# Patient Record
Sex: Female | Born: 1955 | Race: White | Hispanic: No | Marital: Married | Smoking: Former smoker
Health system: Southern US, Community
[De-identification: ages and names within clinical notes are randomized; demographics above are authoritative.]

## PROBLEM LIST (undated history)

## (undated) DIAGNOSIS — C801 Malignant (primary) neoplasm, unspecified: Secondary | ICD-10-CM

## (undated) DIAGNOSIS — M1990 Unspecified osteoarthritis, unspecified site: Secondary | ICD-10-CM

## (undated) DIAGNOSIS — T7840XA Allergy, unspecified, initial encounter: Secondary | ICD-10-CM

## (undated) DIAGNOSIS — E079 Disorder of thyroid, unspecified: Secondary | ICD-10-CM

## (undated) DIAGNOSIS — F419 Anxiety disorder, unspecified: Secondary | ICD-10-CM

## (undated) DIAGNOSIS — R112 Nausea with vomiting, unspecified: Secondary | ICD-10-CM

## (undated) DIAGNOSIS — Z9889 Other specified postprocedural states: Secondary | ICD-10-CM

## (undated) DIAGNOSIS — E785 Hyperlipidemia, unspecified: Secondary | ICD-10-CM

## (undated) DIAGNOSIS — E039 Hypothyroidism, unspecified: Secondary | ICD-10-CM

## (undated) DIAGNOSIS — J439 Emphysema, unspecified: Secondary | ICD-10-CM

## (undated) DIAGNOSIS — I739 Peripheral vascular disease, unspecified: Secondary | ICD-10-CM

## (undated) DIAGNOSIS — F32A Depression, unspecified: Secondary | ICD-10-CM

## (undated) DIAGNOSIS — Z5189 Encounter for other specified aftercare: Secondary | ICD-10-CM

## (undated) DIAGNOSIS — I6529 Occlusion and stenosis of unspecified carotid artery: Secondary | ICD-10-CM

## (undated) HISTORY — DX: Peripheral vascular disease, unspecified: I73.9

## (undated) HISTORY — PX: ANGIOPLASTY ILLIAC ARTERY: SHX5720

## (undated) HISTORY — DX: Emphysema, unspecified: J43.9

## (undated) HISTORY — DX: Disorder of thyroid, unspecified: E07.9

## (undated) HISTORY — DX: Hyperlipidemia, unspecified: E78.5

## (undated) HISTORY — DX: Occlusion and stenosis of unspecified carotid artery: I65.29

## (undated) HISTORY — PX: NOSE SURGERY: SHX723

## (undated) HISTORY — DX: Encounter for other specified aftercare: Z51.89

## (undated) HISTORY — DX: Depression, unspecified: F32.A

## (undated) HISTORY — DX: Anxiety disorder, unspecified: F41.9

## (undated) HISTORY — DX: Allergy, unspecified, initial encounter: T78.40XA

## (undated) HISTORY — PX: SKIN CANCER EXCISION: SHX779

## (undated) HISTORY — DX: Malignant (primary) neoplasm, unspecified: C80.1

## (undated) HISTORY — DX: Hypocalcemia: E83.51

## (undated) HISTORY — PX: HERNIA REPAIR: SHX51

---

## 2005-02-19 ENCOUNTER — Ambulatory Visit: Payer: Self-pay | Admitting: Family Medicine

## 2005-11-13 ENCOUNTER — Ambulatory Visit: Payer: Self-pay | Admitting: "Endocrinology

## 2005-11-21 ENCOUNTER — Encounter (HOSPITAL_COMMUNITY): Admission: RE | Admit: 2005-11-21 | Discharge: 2006-02-19 | Payer: Self-pay | Admitting: "Endocrinology

## 2005-11-26 DIAGNOSIS — C801 Malignant (primary) neoplasm, unspecified: Secondary | ICD-10-CM

## 2005-11-26 DIAGNOSIS — E079 Disorder of thyroid, unspecified: Secondary | ICD-10-CM

## 2005-11-26 HISTORY — PX: THYROID SURGERY: SHX805

## 2005-11-26 HISTORY — DX: Disorder of thyroid, unspecified: E07.9

## 2005-11-26 HISTORY — DX: Malignant (primary) neoplasm, unspecified: C80.1

## 2005-12-24 ENCOUNTER — Encounter: Admission: RE | Admit: 2005-12-24 | Discharge: 2005-12-24 | Payer: Self-pay | Admitting: "Endocrinology

## 2005-12-24 ENCOUNTER — Encounter (INDEPENDENT_AMBULATORY_CARE_PROVIDER_SITE_OTHER): Payer: Self-pay | Admitting: *Deleted

## 2005-12-24 ENCOUNTER — Other Ambulatory Visit: Admission: RE | Admit: 2005-12-24 | Discharge: 2005-12-24 | Payer: Self-pay | Admitting: Interventional Radiology

## 2006-02-06 ENCOUNTER — Ambulatory Visit: Payer: Self-pay | Admitting: Otolaryngology

## 2006-03-15 ENCOUNTER — Emergency Department (HOSPITAL_COMMUNITY): Admission: EM | Admit: 2006-03-15 | Discharge: 2006-03-15 | Payer: Self-pay | Admitting: Emergency Medicine

## 2006-04-02 ENCOUNTER — Ambulatory Visit: Payer: Self-pay | Admitting: "Endocrinology

## 2006-10-21 ENCOUNTER — Ambulatory Visit: Payer: Self-pay | Admitting: "Endocrinology

## 2007-06-03 ENCOUNTER — Ambulatory Visit: Payer: Self-pay | Admitting: "Endocrinology

## 2007-11-12 ENCOUNTER — Ambulatory Visit: Payer: Self-pay | Admitting: "Endocrinology

## 2008-02-26 ENCOUNTER — Ambulatory Visit: Payer: Self-pay | Admitting: "Endocrinology

## 2011-04-06 ENCOUNTER — Other Ambulatory Visit: Payer: Self-pay | Admitting: Internal Medicine

## 2011-08-16 ENCOUNTER — Telehealth: Payer: Self-pay | Admitting: Internal Medicine

## 2011-08-16 NOTE — Telephone Encounter (Signed)
Yes, you can add her on.

## 2011-08-16 NOTE — Telephone Encounter (Signed)
Yes can add on

## 2011-08-16 NOTE — Telephone Encounter (Signed)
Patient says that she went to walk in clinic for sore throat, congestion, nasal drainage. She has been on amoxicillin since Saturday, but is still not feeling any better and has still been running low grade fever. She is asking if she could be added on tomorrow. Please advise.

## 2011-08-16 NOTE — Telephone Encounter (Signed)
Left message asking for patient to return my call.

## 2011-08-16 NOTE — Telephone Encounter (Signed)
Tried calling patient, got no answer and was unable to leave message.

## 2011-08-16 NOTE — Telephone Encounter (Signed)
Pt called she would like an appoinment asap  She has went to walk in clinic  Cold sore throat cough Saturday ran fever went to [facility] acute md was concerned that her heart was racing.  Put pt on 875 amoxcillian twice a day.  Yesterday she ran low grade fever still had cough and hard to breath

## 2011-09-17 ENCOUNTER — Other Ambulatory Visit: Payer: Self-pay | Admitting: Internal Medicine

## 2011-09-17 MED ORDER — ZOLPIDEM TARTRATE 10 MG PO TABS: 10.0000 mg | ORAL_TABLET | Freq: Every evening | ORAL | Status: DC | PRN

## 2011-10-19 ENCOUNTER — Encounter: Payer: Self-pay | Admitting: Internal Medicine

## 2011-10-19 ENCOUNTER — Ambulatory Visit (INDEPENDENT_AMBULATORY_CARE_PROVIDER_SITE_OTHER): Payer: Self-pay | Admitting: Internal Medicine

## 2011-10-19 VITALS — BP 126/64 | HR 93 | Temp 98.3°F | Resp 16 | Ht 67.0 in | Wt 135.0 lb

## 2011-10-19 DIAGNOSIS — Z23 Encounter for immunization: Secondary | ICD-10-CM

## 2011-10-19 DIAGNOSIS — Z1239 Encounter for other screening for malignant neoplasm of breast: Secondary | ICD-10-CM

## 2011-10-19 DIAGNOSIS — Z780 Asymptomatic menopausal state: Secondary | ICD-10-CM

## 2011-10-19 DIAGNOSIS — Z87891 Personal history of nicotine dependence: Secondary | ICD-10-CM

## 2011-10-19 DIAGNOSIS — F411 Generalized anxiety disorder: Secondary | ICD-10-CM

## 2011-10-19 DIAGNOSIS — E785 Hyperlipidemia, unspecified: Secondary | ICD-10-CM

## 2011-10-19 DIAGNOSIS — F419 Anxiety disorder, unspecified: Secondary | ICD-10-CM

## 2011-10-19 DIAGNOSIS — F17200 Nicotine dependence, unspecified, uncomplicated: Secondary | ICD-10-CM

## 2011-10-19 DIAGNOSIS — E039 Hypothyroidism, unspecified: Secondary | ICD-10-CM

## 2011-10-19 DIAGNOSIS — Z1211 Encounter for screening for malignant neoplasm of colon: Secondary | ICD-10-CM

## 2011-10-19 DIAGNOSIS — E079 Disorder of thyroid, unspecified: Secondary | ICD-10-CM

## 2011-10-19 DIAGNOSIS — Z124 Encounter for screening for malignant neoplasm of cervix: Secondary | ICD-10-CM

## 2011-10-19 DIAGNOSIS — Z79899 Other long term (current) drug therapy: Secondary | ICD-10-CM

## 2011-10-19 DIAGNOSIS — N951 Menopausal and female climacteric states: Secondary | ICD-10-CM

## 2011-10-19 LAB — HEPATIC FUNCTION PANEL
ALT: 16 U/L (ref 0–35)
AST: 20 U/L (ref 0–37)
Albumin: 4.1 g/dL (ref 3.5–5.2)
Alkaline Phosphatase: 97 U/L (ref 39–117)

## 2011-10-19 LAB — TSH: TSH: 4.69 u[IU]/mL (ref 0.35–5.50)

## 2011-10-19 MED ORDER — CONJ ESTROG-MEDROXYPROGEST ACE 0.625-2.5 MG PO TABS: 1.0000 | ORAL_TABLET | Freq: Every day | ORAL | Status: DC

## 2011-10-19 MED ORDER — CARVEDILOL 3.125 MG PO TABS: 3.1250 mg | ORAL_TABLET | Freq: Two times a day (BID) | ORAL | Status: DC

## 2011-10-19 MED ORDER — ALPRAZOLAM 0.25 MG PO TABS: 0.2500 mg | ORAL_TABLET | Freq: Every day | ORAL | Status: DC | PRN

## 2011-10-19 MED ORDER — ROSUVASTATIN CALCIUM 20 MG PO TABS: 20.0000 mg | ORAL_TABLET | Freq: Every day | ORAL | Status: DC

## 2011-10-19 MED ORDER — CONJ ESTROG-MEDROXYPROGEST ACE 0.625-5 MG PO TABS: 1.0000 | ORAL_TABLET | Freq: Every day | ORAL | Status: DC

## 2011-10-19 NOTE — Progress Notes (Signed)
  Subjective:    Patient ID: Tracie Morris, female    DOB: 03/16/1956, 55 y.o.   MRN: [id number]  HPI   13 you white female with history of tobacco abuse, postmenopausal syndrome, hypothyroidism treated in late Sept for bronchitis.  By Urgent Care with Augmentin followed by recurrence of fevers and cough.  Was reevaluated, chest ray was reportedly normal and her antibioitcs were changed from augmentin to clarithromycin .  Because of her portracted symptoms she missed 6 days of work    Review of Systems  Constitutional: Negative for fever, chills and unexpected weight change.  HENT: Negative for hearing loss, ear pain, nosebleeds, congestion, sore throat, facial swelling, rhinorrhea, sneezing, mouth sores, trouble swallowing, neck pain, neck stiffness, voice change, postnasal drip, sinus pressure, tinnitus and ear discharge.   Eyes: Negative for pain, discharge, redness and visual disturbance.  Respiratory: Negative for cough, chest tightness, shortness of breath, wheezing and stridor.   Cardiovascular: Negative for chest pain, palpitations and leg swelling.  Musculoskeletal: Negative for myalgias and arthralgias.  Skin: Negative for color change and rash.  Neurological: Negative for dizziness, weakness, light-headedness and headaches.  Hematological: Negative for adenopathy.       Objective:   Physical Exam  Constitutional: She is oriented to person, place, and time. She appears well-developed and well-nourished.  HENT:  Mouth/Throat: Oropharynx is clear and moist.  Eyes: EOM are normal. Pupils are equal, round, and reactive to light. No scleral icterus.  Neck: Normal range of motion. Neck supple. No JVD present. No thyromegaly present.  Cardiovascular: Normal rate, regular rhythm, normal heart sounds and intact distal pulses.   Pulmonary/Chest: Effort normal and breath sounds normal.  Abdominal: Soft. Bowel sounds are normal. She exhibits no mass. There is no tenderness.    Musculoskeletal: Normal range of motion. She exhibits no edema.  Lymphadenopathy:    She has no cervical adenopathy.  Neurological: She is alert and oriented to person, place, and time.  Skin: Skin is warm and dry.  Psychiatric: She has a normal mood and affect.          Assessment & Plan:   Subjective:     Tracie Morris is a 55 y.o. female who began using tobacco 30 years ago.  She has had a high interest in quitting for several years and has tried to quit several times,  And smokes cigarettes .  She recognizes the risks of chronic tobacco use, which include COPD, CAD, CVD, PAD and blood clots, especially with concurrent use of HRT.  She has used nicotine patches in the past and present and is considering changing to the electric cigarette.    Assessment:    Tobacco Use/Cessation. I assessed Tracie Morris to be in an action stage with respect to tobacco cessation.    Plan:

## 2011-10-19 NOTE — Patient Instructions (Addendum)
Increase aspirin to 325 mg daily .Marland Kitchen   Please quit smoking,  You may use the alprazolam once a day as needed for anxiety

## 2011-10-20 ENCOUNTER — Encounter: Payer: Self-pay | Admitting: Internal Medicine

## 2011-10-20 DIAGNOSIS — E785 Hyperlipidemia, unspecified: Secondary | ICD-10-CM | POA: Insufficient documentation

## 2011-10-20 DIAGNOSIS — Z124 Encounter for screening for malignant neoplasm of cervix: Secondary | ICD-10-CM | POA: Insufficient documentation

## 2011-10-20 DIAGNOSIS — Z1239 Encounter for other screening for malignant neoplasm of breast: Secondary | ICD-10-CM | POA: Insufficient documentation

## 2011-10-20 DIAGNOSIS — Z1211 Encounter for screening for malignant neoplasm of colon: Secondary | ICD-10-CM | POA: Insufficient documentation

## 2011-10-20 DIAGNOSIS — E032 Hypothyroidism due to medicaments and other exogenous substances: Secondary | ICD-10-CM | POA: Insufficient documentation

## 2011-10-20 DIAGNOSIS — G43909 Migraine, unspecified, not intractable, without status migrainosus: Secondary | ICD-10-CM | POA: Insufficient documentation

## 2011-10-20 NOTE — Assessment & Plan Note (Signed)
Last mammogram normal June 2012 at [facility]

## 2011-10-20 NOTE — Assessment & Plan Note (Signed)
Repeat TSH is WNL.  She has not tolerated higher doses of Synthroid for goal TSH of 1.0  Due to fatigue

## 2011-10-20 NOTE — Assessment & Plan Note (Signed)
She has historically deferred screening.  Will discuss at next PE May 2012

## 2011-10-20 NOTE — Assessment & Plan Note (Signed)
Normal PAP May 2011.  No history of abnormals. On HRT for menopause

## 2011-10-22 ENCOUNTER — Encounter: Payer: Self-pay | Admitting: Internal Medicine

## 2011-10-29 ENCOUNTER — Other Ambulatory Visit: Payer: Self-pay | Admitting: Internal Medicine

## 2011-10-29 MED ORDER — LEVOTHYROXINE SODIUM 137 MCG PO TABS: 137.0000 ug | ORAL_TABLET | Freq: Every day | ORAL | Status: DC

## 2012-01-02 ENCOUNTER — Encounter: Payer: Self-pay | Admitting: Internal Medicine

## 2012-03-31 ENCOUNTER — Other Ambulatory Visit: Payer: Self-pay | Admitting: Internal Medicine

## 2012-03-31 MED ORDER — ZOLPIDEM TARTRATE 10 MG PO TABS: 10.0000 mg | ORAL_TABLET | Freq: Every evening | ORAL | Status: DC | PRN

## 2012-05-09 ENCOUNTER — Encounter: Payer: Self-pay | Admitting: Internal Medicine

## 2012-05-16 ENCOUNTER — Encounter: Payer: Self-pay | Admitting: Internal Medicine

## 2012-05-16 ENCOUNTER — Ambulatory Visit (INDEPENDENT_AMBULATORY_CARE_PROVIDER_SITE_OTHER): Payer: Self-pay | Admitting: Internal Medicine

## 2012-05-16 VITALS — BP 128/62 | HR 73 | Temp 98.0°F | Resp 14 | Ht 67.0 in | Wt 139.5 lb

## 2012-05-16 DIAGNOSIS — Z79899 Other long term (current) drug therapy: Secondary | ICD-10-CM

## 2012-05-16 DIAGNOSIS — I798 Other disorders of arteries, arterioles and capillaries in diseases classified elsewhere: Secondary | ICD-10-CM | POA: Insufficient documentation

## 2012-05-16 DIAGNOSIS — E785 Hyperlipidemia, unspecified: Secondary | ICD-10-CM

## 2012-05-16 DIAGNOSIS — Z1239 Encounter for other screening for malignant neoplasm of breast: Secondary | ICD-10-CM

## 2012-05-16 DIAGNOSIS — E039 Hypothyroidism, unspecified: Secondary | ICD-10-CM

## 2012-05-16 DIAGNOSIS — E079 Disorder of thyroid, unspecified: Secondary | ICD-10-CM

## 2012-05-16 DIAGNOSIS — Z1211 Encounter for screening for malignant neoplasm of colon: Secondary | ICD-10-CM

## 2012-05-16 DIAGNOSIS — I739 Peripheral vascular disease, unspecified: Secondary | ICD-10-CM

## 2012-05-16 DIAGNOSIS — Z124 Encounter for screening for malignant neoplasm of cervix: Secondary | ICD-10-CM

## 2012-05-16 LAB — COMPLETE METABOLIC PANEL WITH GFR
ALT: 17 U/L (ref 0–35)
Albumin: 4.4 g/dL (ref 3.5–5.2)
Alkaline Phosphatase: 89 U/L (ref 39–117)
CO2: 27 mEq/L (ref 19–32)
Potassium: 4.3 mEq/L (ref 3.5–5.3)
Sodium: 138 mEq/L (ref 135–145)
Total Bilirubin: 0.6 mg/dL (ref 0.3–1.2)
Total Protein: 7.1 g/dL (ref 6.0–8.3)

## 2012-05-16 LAB — LIPID PANEL
HDL: 47.8 mg/dL (ref >39.00)
Total CHOL/HDL Ratio: 3
VLDL: 16.6 mg/dL (ref 0.0–40.0)

## 2012-05-16 LAB — TSH: TSH: 3.49 u[IU]/mL (ref 0.35–5.50)

## 2012-05-16 NOTE — Assessment & Plan Note (Addendum)
Left leg,  With recent vascular evaluation confirming need for intervention with ABI 0.76.  Patient is uninsured presently and the cost is $30,000.00  Discussed options of having procedure done at [facility] if more affordable vs waiting t until January when she is elegible for obama care.  She has quit smoking

## 2012-05-16 NOTE — Progress Notes (Signed)
Patient ID: Tracie Morris, female   DOB: 04/25/1956, 56 y.o.   MRN: [id number] SUBJECTIVE:  56 y.o. female for annual routine Pap and checkup. She quit smoking in December after being diagnosed with  PAD of LLE.  Last PAP was normal in 2011 and she has been using FOBTS annually for colon CA screening.  She is having constant LLE pain and now knee pain due to PAD but cannot afford the surgery bc she is currently uninsured.   She denies chest pain, shortness of breath and abdominal pain,  No LE swelling,  Wears her seat belts 100% of the time.   Current Outpatient Prescriptions  Medication Sig Dispense Refill  . calcium carbonate (OS-CAL) 600 MG TABS Take 600 mg by mouth 2 (two) times daily with a meal.        . estrogen, conjugated,-medroxyprogesterone (PREMPRO) 0.625-2.5 MG per tablet Take 1 tablet by mouth daily.  30 tablet  3  . levothyroxine (SYNTHROID, LEVOTHROID) 137 MCG tablet Take 1 tablet (137 mcg total) by mouth daily.  30 tablet  3  . Multiple Vitamin (MULTIVITAMIN) tablet Take 1 tablet by mouth daily.        . rizatriptan (MAXALT-MLT) 10 MG disintegrating tablet Take 10 mg by mouth as needed. May repeat in 2 hours if needed      . rosuvastatin (CRESTOR) 20 MG tablet Take 1 tablet (20 mg total) by mouth daily.  30 tablet  5  . zolpidem (AMBIEN) 10 MG tablet Take 1 tablet (10 mg total) by mouth at bedtime as needed for sleep.  30 tablet  3   Allergies: Darvocet and Demerol  No LMP recorded. Patient is postmenopausal.  ROS:  Feeling well. No dyspnea or chest pain on exertion.  No abdominal pain, change in bowel habits, black or bloody stools.  No urinary tract symptoms. GYN ROS: normal menses, no abnormal bleeding, pelvic pain or discharge, no breast pain or new or enlarging lumps on self exam. No neurological complaints.  OBJECTIVE:  The patient appears well, alert, oriented x 3, in no distress. BP 128/62  Pulse 73  Temp 98 F (36.7 C) (Oral)  Resp 14  Ht 5\' 7"  (1.702 m)  Wt 139 lb  8 oz (63.277 kg)  BMI 21.85 kg/m2  SpO2 97%  ENT normal.  Neck supple. No adenopathy or thyromegaly. PERLA. Lungs are clear, good air entry, no wheezes, rhonchi or rales. S1 and S2 normal, no murmurs, regular rate and rhythm. Abdomen soft without tenderness, guarding, mass or organomegaly. Extremities show no edema, decreased left lower extremity pulse. Neurological is normal, no focal findings. Neck: no adenopathy, no carotid bruit, no JVD, supple, symmetrical, trachea midline and thyroid not enlarged, symmetric, no tenderness/mass/nodules Lungs: clear to auscultation bilaterally Heart: regular rate and rhythm, S1, S2 normal, no murmur, click, rub or gallop Abdomen: soft, non-tender; bowel sounds normal; no masses,  no organomegaly Skin: Skin color, texture, turgor normal. No rashes or lesions BREAST EXAM: breasts appear normal, no suspicious masses, no skin or nipple changes or axillary nodes PELVIC EXAM: exam declined by the patient  ASSESSMENT:   Peripheral vascular disease with pain at rest Left leg,  With recent vascular evaluation confirming need for intervention with ABI 0.76.  Patient is uninsured presently and the cost is $30,000.00  Discussed options of having procedure done at [facility] if more affordable vs waiting t until January when she is elegible for obama care.  She has quit smoking  hypothyroidism  acquired, secondary to surgery for suspicious bilateral nodules.,  Repeat TSH is WNL.  No medication changes today.  Hyperlipidemia Managed with crestor, LDL is 98.      Screening for cervical cancer She has deferred annual exam until she has insurance.  Last PAP was 3 years ago.  Screening for colon cancer Home occult testing given .   Screening for breast cancer Breast exam ordered, mammogram ordered.    Updated Medication List Outpatient Encounter Prescriptions as of 05/16/2012  Medication Sig Dispense Refill  . calcium carbonate (OS-CAL) 600 MG  TABS Take 600 mg by mouth 2 (two) times daily with a meal.        . estrogen, conjugated,-medroxyprogesterone (PREMPRO) 0.625-2.5 MG per tablet Take 1 tablet by mouth daily.  30 tablet  3  . levothyroxine (SYNTHROID, LEVOTHROID) 137 MCG tablet Take 1 tablet (137 mcg total) by mouth daily.  30 tablet  3  . Multiple Vitamin (MULTIVITAMIN) tablet Take 1 tablet by mouth daily.        . rizatriptan (MAXALT-MLT) 10 MG disintegrating tablet Take 10 mg by mouth as needed. May repeat in 2 hours if needed      . rosuvastatin (CRESTOR) 20 MG tablet Take 1 tablet (20 mg total) by mouth daily.  30 tablet  5  . zolpidem (AMBIEN) 10 MG tablet Take 1 tablet (10 mg total) by mouth at bedtime as needed for sleep.  30 tablet  3  . DISCONTD: ALPRAZolam (XANAX) 0.25 MG tablet Take 1 tablet (0.25 mg total) by mouth daily as needed for anxiety.  30 tablet  3  . DISCONTD: carvedilol (COREG) 3.125 MG tablet Take 1 tablet (3.125 mg total) by mouth 2 (two) times daily with a meal.  60 tablet  3  . DISCONTD: zolpidem (AMBIEN) 10 MG tablet Take 1 tablet (10 mg total) by mouth at bedtime as needed for sleep.  30 tablet  3

## 2012-05-18 ENCOUNTER — Encounter: Payer: Self-pay | Admitting: Internal Medicine

## 2012-05-18 NOTE — Assessment & Plan Note (Signed)
She has deferred annual exam until she has insurance.  Last PAP was 3 years ago.

## 2012-05-18 NOTE — Assessment & Plan Note (Addendum)
acquired, secondary to surgery for suspicious bilateral nodules.,  Repeat TSH is WNL.  No medication changes today.

## 2012-05-18 NOTE — Assessment & Plan Note (Addendum)
Managed with crestor, LDL is 98.

## 2012-05-18 NOTE — Assessment & Plan Note (Signed)
Breast exam ordered, mammogram ordered.

## 2012-05-18 NOTE — Assessment & Plan Note (Signed)
Home occult testing given .

## 2012-05-30 ENCOUNTER — Other Ambulatory Visit: Payer: Self-pay | Admitting: Internal Medicine

## 2012-06-02 ENCOUNTER — Other Ambulatory Visit: Payer: Self-pay | Admitting: Internal Medicine

## 2012-06-02 DIAGNOSIS — Z1211 Encounter for screening for malignant neoplasm of colon: Secondary | ICD-10-CM

## 2012-06-03 ENCOUNTER — Other Ambulatory Visit: Payer: Self-pay

## 2012-06-03 DIAGNOSIS — Z1211 Encounter for screening for malignant neoplasm of colon: Secondary | ICD-10-CM

## 2012-06-04 LAB — FECAL OCCULT BLOOD, GUAIAC: Fecal Occult Blood: NEGATIVE

## 2012-06-17 ENCOUNTER — Encounter: Payer: Self-pay | Admitting: Internal Medicine

## 2012-10-02 ENCOUNTER — Other Ambulatory Visit: Payer: Self-pay

## 2012-10-02 NOTE — Telephone Encounter (Signed)
Refill request for Ambien 10 mg ok to refill?

## 2012-10-03 MED ORDER — ZOLPIDEM TARTRATE 10 MG PO TABS: 10.0000 mg | ORAL_TABLET | Freq: Every evening | ORAL | Status: DC | PRN

## 2012-10-03 NOTE — Telephone Encounter (Signed)
Called in Ambien 10 mg 3 R to [employer]

## 2012-12-02 ENCOUNTER — Other Ambulatory Visit: Payer: Self-pay | Admitting: Internal Medicine

## 2012-12-02 MED ORDER — CONJ ESTROG-MEDROXYPROGEST ACE 0.625-2.5 MG PO TABS: 1.0000 | ORAL_TABLET | Freq: Every day | ORAL | Status: DC

## 2012-12-02 NOTE — Telephone Encounter (Signed)
estrogen, conjugated,-medroxyprogesterone (PREMPRO) 0.625-2.5 MG per tablet   #30

## 2012-12-02 NOTE — Telephone Encounter (Signed)
Med filled.  

## 2012-12-22 ENCOUNTER — Ambulatory Visit: Payer: Self-pay | Admitting: Vascular Surgery

## 2012-12-22 LAB — BASIC METABOLIC PANEL
BUN: 12 mg/dL (ref 7–18)
Creatinine: 0.84 mg/dL (ref 0.60–1.30)
EGFR (African American): >60
Glucose: 88 mg/dL (ref 65–99)
Potassium: 3.5 mmol/L (ref 3.5–5.1)

## 2013-01-05 ENCOUNTER — Ambulatory Visit (INDEPENDENT_AMBULATORY_CARE_PROVIDER_SITE_OTHER): Payer: BC Managed Care – PPO | Admitting: Internal Medicine

## 2013-01-05 ENCOUNTER — Other Ambulatory Visit (HOSPITAL_COMMUNITY)
Admission: RE | Admit: 2013-01-05 | Discharge: 2013-01-05 | Disposition: A | Discharge status: Home / Self Care | Payer: BC Managed Care – PPO | Source: Ambulatory Visit | Attending: Internal Medicine | Admitting: Internal Medicine

## 2013-01-05 ENCOUNTER — Encounter: Payer: Self-pay | Admitting: Internal Medicine

## 2013-01-05 VITALS — BP 124/66 | HR 78 | Temp 98.1°F | Resp 16 | Ht 67.0 in | Wt 139.5 lb

## 2013-01-05 DIAGNOSIS — R053 Chronic cough: Secondary | ICD-10-CM

## 2013-01-05 DIAGNOSIS — I739 Peripheral vascular disease, unspecified: Secondary | ICD-10-CM

## 2013-01-05 DIAGNOSIS — E785 Hyperlipidemia, unspecified: Secondary | ICD-10-CM

## 2013-01-05 DIAGNOSIS — Z79899 Other long term (current) drug therapy: Secondary | ICD-10-CM

## 2013-01-05 DIAGNOSIS — Z13820 Encounter for screening for osteoporosis: Secondary | ICD-10-CM

## 2013-01-05 DIAGNOSIS — R0989 Other specified symptoms and signs involving the circulatory and respiratory systems: Secondary | ICD-10-CM

## 2013-01-05 DIAGNOSIS — Z0000 Encounter for general adult medical examination without abnormal findings: Secondary | ICD-10-CM

## 2013-01-05 DIAGNOSIS — R5383 Other fatigue: Secondary | ICD-10-CM

## 2013-01-05 DIAGNOSIS — Z1151 Encounter for screening for human papillomavirus (HPV): Secondary | ICD-10-CM | POA: Insufficient documentation

## 2013-01-05 DIAGNOSIS — Z124 Encounter for screening for malignant neoplasm of cervix: Secondary | ICD-10-CM

## 2013-01-05 DIAGNOSIS — R05 Cough: Secondary | ICD-10-CM

## 2013-01-05 DIAGNOSIS — E559 Vitamin D deficiency, unspecified: Secondary | ICD-10-CM

## 2013-01-05 DIAGNOSIS — E079 Disorder of thyroid, unspecified: Secondary | ICD-10-CM

## 2013-01-05 DIAGNOSIS — E039 Hypothyroidism, unspecified: Secondary | ICD-10-CM

## 2013-01-05 DIAGNOSIS — R059 Cough, unspecified: Secondary | ICD-10-CM

## 2013-01-05 DIAGNOSIS — Z1239 Encounter for other screening for malignant neoplasm of breast: Secondary | ICD-10-CM

## 2013-01-05 DIAGNOSIS — R5381 Other malaise: Secondary | ICD-10-CM

## 2013-01-05 DIAGNOSIS — Z01419 Encounter for gynecological examination (general) (routine) without abnormal findings: Secondary | ICD-10-CM | POA: Insufficient documentation

## 2013-01-05 DIAGNOSIS — Z1211 Encounter for screening for malignant neoplasm of colon: Secondary | ICD-10-CM

## 2013-01-05 DIAGNOSIS — D72829 Elevated white blood cell count, unspecified: Secondary | ICD-10-CM

## 2013-01-05 LAB — CBC WITH DIFFERENTIAL/PLATELET
Basophils Absolute: 0.1 10*3/uL (ref 0.0–0.1)
Basophils Relative: 0.4 % (ref 0.0–3.0)
Eosinophils Relative: 1.3 % (ref 0.0–5.0)
Hemoglobin: 15.1 g/dL — ABNORMAL HIGH (ref 12.0–15.0)
Lymphocytes Relative: 27.6 % (ref 12.0–46.0)
Monocytes Relative: 5.4 % (ref 3.0–12.0)
Neutro Abs: 8.6 10*3/uL — ABNORMAL HIGH (ref 1.4–7.7)
RBC: 4.6 Mil/uL (ref 3.87–5.11)
RDW: 13 % (ref 11.5–14.6)
WBC: 13.2 10*3/uL — ABNORMAL HIGH (ref 4.5–10.5)

## 2013-01-05 LAB — COMPREHENSIVE METABOLIC PANEL
ALT: 18 U/L (ref 0–35)
Albumin: 4.1 g/dL (ref 3.5–5.2)
CO2: 25 mEq/L (ref 19–32)
Calcium: 9.2 mg/dL (ref 8.4–10.5)
Chloride: 103 mEq/L (ref 96–112)
GFR: 71.2 mL/min (ref >60.00)
Glucose, Bld: 82 mg/dL (ref 70–99)
Sodium: 139 mEq/L (ref 135–145)
Total Protein: 8.1 g/dL (ref 6.0–8.3)

## 2013-01-05 LAB — LIPID PANEL: Total CHOL/HDL Ratio: 5

## 2013-01-05 LAB — POCT URINALYSIS DIPSTICK
Bilirubin, UA: NEGATIVE
Glucose, UA: NEGATIVE
Nitrite, UA: NEGATIVE

## 2013-01-05 MED ORDER — RIZATRIPTAN BENZOATE 10 MG PO TBDP: 10.0000 mg | ORAL_TABLET | ORAL | Status: DC | PRN

## 2013-01-05 MED ORDER — LEVOTHYROXINE SODIUM 150 MCG PO TABS: 150.0000 ug | ORAL_TABLET | Freq: Every day | ORAL | Status: DC

## 2013-01-05 MED ORDER — BUTORPHANOL TARTRATE 10 MG/ML NA SOLN: 1.0000 | Freq: Once | NASAL | Status: DC | PRN

## 2013-01-05 NOTE — Assessment & Plan Note (Addendum)
S/p PTCA with balloon angioplasty of left  common iliac artery  On jan 14th by Festus Barren.  She now has sympotms of carotid artery stenosis..  Carotid ultrasound ordered.

## 2013-01-05 NOTE — Assessment & Plan Note (Addendum)
Repeat TSH due is underactive. Dose will be increased

## 2013-01-05 NOTE — Assessment & Plan Note (Addendum)
Pelvic and PAP were done today,  Pelvic exam was normal

## 2013-01-05 NOTE — Assessment & Plan Note (Signed)
Continue annual FOBTS which are up to date and negative

## 2013-01-05 NOTE — Progress Notes (Signed)
Patient ID: Tracie Morris, female   DOB: 07-May-1956, 57 y.o.   MRN: [id number]  Patient Active Problem List  Diagnosis  . Thyroid cancer  . hypothyroidism  . Screening for cervical cancer  . Screening for breast cancer  . Screening for colon cancer  . Hyperlipidemia  . Migraine  . Peripheral vascular disease with pain at rest    Subjective:  CC:   Chief Complaint  Patient presents with  . Annual Exam    HPI:   Tracie Morris a 57 y.o. female who presents For her PAP smear,  Which was deferred last time bc of insurance lapse.  She has stopped the prempro due to pharmacy miscommunicaton and feels much better.  Her claudication symptoms resolved with angioplasty of left common iliac artery.    Past Medical History  Diagnosis Date  . Hypocalcemia   . Migraine     improved since thyroid surgery, 2 years  . Thyroid cancer 2007    s/p thyroidectomy  . Hyperlipidemia   . hypothyroidism 2007    acquired, secondary to surgery for thyroid Ca    Past Surgical History  Procedure Laterality Date  . Thyroid surgery  2007    secondary to thyroid cancer    The following portions of the patient's history were reviewed and updated as appropriate: Allergies, current medications, and problem list.   Review of Systems:   Patient denies headache, fevers, malaise, unintentional weight loss, skin rash, eye pain, sinus congestion and sinus pain, sore throat, dysphagia,  hemoptysis , cough, dyspnea, wheezing, chest pain, palpitations, orthopnea, edema, abdominal pain, nausea, melena, diarrhea, constipation, flank pain, dysuria, hematuria, urinary  Frequency, nocturia, numbness, tingling, seizures,  Focal weakness, Loss of consciousness,  Tremor, insomnia, depression, anxiety, and suicidal ideation.     History   Social History  . Marital Status: Married    Spouse Name: N/A    Number of Children: N/A  . Years of Education: N/A   Occupational History  . Not on file.   Social  History Main Topics  . Smoking status: Former Smoker -- 1.00 packs/day    Types: Cigarettes    Quit date: 10/28/2011  . Smokeless tobacco: Never Used  . Alcohol Use: No  . Drug Use: No  . Sexually Active: Not on file   Other Topics Concern  . Not on file   Social History Narrative  . No narrative on file    Objective:  BP 124/66  Pulse 78  Temp(Src) 98.1 F (36.7 C) (Oral)  Resp 16  Ht 5\' 7"  (1.702 m)  Wt 139 lb 8 oz (63.277 kg)  BMI 21.84 kg/m2  SpO2 98%  BP 124/66  Pulse 78  Temp(Src) 98.1 F (36.7 C) (Oral)  Resp 16  Ht 5\' 7"  (1.702 m)  Wt 139 lb 8 oz (63.277 kg)  BMI 21.84 kg/m2  SpO2 98%  General Appearance:    Alert, cooperative, no distress, appears stated age  Head:    Normocephalic, without obvious abnormality, atraumatic  Eyes:    PERRL, conjunctiva/corneas clear, EOM's intact, fundi    benign, both eyes  Ears:    Normal TM's and external ear canals, both ears  Nose:   Nares normal, septum midline, mucosa normal, no drainage    or sinus tenderness  Throat:   Lips, mucosa, and tongue normal; teeth and gums normal  Neck:   Supple, symmetrical, trachea midline, no adenopathy;    thyroid:  no enlargement/tenderness/nodules; no carotid  bruit or JVD  Back:     Symmetric, no curvature, ROM normal, no CVA tenderness  Lungs:     Clear to auscultation bilaterally, respirations unlabored  Chest Wall:    No tenderness or deformity   Heart:    Regular rate and rhythm, S1 and S2 normal, no murmur, rub   or gallop  Breast Exam:    No tenderness, masses, or nipple abnormality  Abdomen:     Soft, non-tender, bowel sounds active all four quadrants,    no masses, no organomegaly  Genitalia:    Normal female without lesion, discharge or tenderness  Rectal:    Normal tone,  no masses or tenderness;     Extremities:   Extremities normal, atraumatic, no cyanosis or edema  Pulses:   2+ and symmetric all extremities  Skin:   Skin color, texture, turgor normal, no  rashes or lesions  Lymph nodes:   Cervical, supraclavicular, and axillary nodes normal  Neurologic:   CNII-XII intact, normal strength, sensation and reflexes    throughout    Assessment and Plan:  Screening for breast cancer Done June ,  Mammogram normal July   Screening for cervical cancer Pelvic and PAP were done today,  Pelvic exam was normal   Screening for colon cancer Continue annual FOBTS which are up to date and negative   Peripheral vascular disease with pain at rest S/p PTCA with balloon angioplasty of left  common iliac artery  On jan 14th by Festus Barren.  She now has sympotms of carotid artery stenosis..  Carotid ultrasound ordered.    hypothyroidism Repeat TSH due is underactive. Dose will be increased   Updated Medication List Outpatient Encounter Prescriptions as of 01/05/2013  Medication Sig Dispense Refill  . calcium carbonate (OS-CAL) 600 MG TABS Take 600 mg by mouth 2 (two) times daily with a meal.        . Multiple Vitamin (MULTIVITAMIN) tablet Take 1 tablet by mouth daily.        . rizatriptan (MAXALT-MLT) 10 MG disintegrating tablet Take 1 tablet (10 mg total) by mouth as needed. May repeat in 2 hours if needed  10 tablet  3  . rosuvastatin (CRESTOR) 20 MG tablet Take 1 tablet (20 mg total) by mouth daily.  30 tablet  5  . zolpidem (AMBIEN) 10 MG tablet Take 1 tablet (10 mg total) by mouth at bedtime as needed for sleep.  30 tablet  3  . [DISCONTINUED] rizatriptan (MAXALT-MLT) 10 MG disintegrating tablet Take 10 mg by mouth as needed. May repeat in 2 hours if needed      . [DISCONTINUED] rizatriptan (MAXALT-MLT) 10 MG disintegrating tablet Take 1 tablet (10 mg total) by mouth as needed. May repeat in 2 hours if needed  10 tablet  3  . [DISCONTINUED] SYNTHROID 137 MCG tablet TAKE ONE (1) TABLET EACH DAY  30 tablet  5  . butorphanol (STADOL) 10 MG/ML nasal spray Place 1 spray into the nose Once PRN for pain.  2.5 mL  2  . levothyroxine (SYNTHROID, LEVOTHROID)  150 MCG tablet Take 1 tablet (150 mcg total) by mouth daily.  90 tablet  3  . [DISCONTINUED] estrogen, conjugated,-medroxyprogesterone (PREMPRO) 0.625-2.5 MG per tablet Take 1 tablet by mouth daily.  30 tablet  3   No facility-administered encounter medications on file as of 01/05/2013.

## 2013-01-05 NOTE — Patient Instructions (Addendum)
Fasting labs today  Chest x ray at your leisure at [facility] (no appt needed,  You have the rx  Amber will call you with the DEXA and carotid ultrasound appts

## 2013-01-05 NOTE — Assessment & Plan Note (Addendum)
Done June ,  Mammogram normal July

## 2013-01-06 LAB — VITAMIN D 25 HYDROXY (VIT D DEFICIENCY, FRACTURES): Vit D, 25-Hydroxy: 36 ng/mL (ref 30–89)

## 2013-01-06 NOTE — Addendum Note (Signed)
Addended by: Sherlene Shams on: 01/06/2013 02:00 PM   Modules accepted: Orders

## 2013-01-11 LAB — HM PAP SMEAR: HM PAP: NORMAL

## 2013-01-11 LAB — HM DEXA SCAN

## 2013-01-12 ENCOUNTER — Other Ambulatory Visit: Payer: Self-pay | Admitting: Internal Medicine

## 2013-01-12 DIAGNOSIS — M810 Age-related osteoporosis without current pathological fracture: Secondary | ICD-10-CM

## 2013-01-13 ENCOUNTER — Telehealth: Payer: Self-pay | Admitting: Internal Medicine

## 2013-01-13 NOTE — Telephone Encounter (Signed)
DEDXA scan indicates moderate bone loss, in the osteopenia range,  Needs to continue calcium/vit d and wt bearing exercise.  Repeat in 2 yrs.  If there is continued loss will discuss medications

## 2013-01-13 NOTE — Telephone Encounter (Signed)
Pt.notified

## 2013-01-28 ENCOUNTER — Encounter: Payer: Self-pay | Admitting: Internal Medicine

## 2013-03-09 ENCOUNTER — Other Ambulatory Visit (INDEPENDENT_AMBULATORY_CARE_PROVIDER_SITE_OTHER): Payer: BC Managed Care – PPO

## 2013-03-09 DIAGNOSIS — D72829 Elevated white blood cell count, unspecified: Secondary | ICD-10-CM

## 2013-03-09 DIAGNOSIS — E785 Hyperlipidemia, unspecified: Secondary | ICD-10-CM

## 2013-03-09 LAB — CBC WITH DIFFERENTIAL/PLATELET
Basophils Absolute: 0 10*3/uL (ref 0.0–0.1)
Eosinophils Absolute: 0.2 10*3/uL (ref 0.0–0.7)
Lymphocytes Relative: 30.1 % (ref 12.0–46.0)
MCHC: 34.2 g/dL (ref 30.0–36.0)
Neutrophils Relative %: 61.5 % (ref 43.0–77.0)
RBC: 4.12 Mil/uL (ref 3.87–5.11)
RDW: 13.1 % (ref 11.5–14.6)

## 2013-03-09 LAB — TSH: TSH: 0.39 u[IU]/mL (ref 0.35–5.50)

## 2013-03-10 ENCOUNTER — Encounter: Payer: Self-pay | Admitting: General Practice

## 2013-03-24 ENCOUNTER — Other Ambulatory Visit: Payer: Self-pay | Admitting: *Deleted

## 2013-03-24 MED ORDER — ZOLPIDEM TARTRATE 10 MG PO TABS: 10.0000 mg | ORAL_TABLET | Freq: Every evening | ORAL | Status: DC | PRN

## 2013-03-27 ENCOUNTER — Telehealth: Payer: Self-pay

## 2013-03-27 ENCOUNTER — Other Ambulatory Visit: Payer: Self-pay | Admitting: *Deleted

## 2013-03-27 MED ORDER — ZOLPIDEM TARTRATE 10 MG PO TABS: 10.0000 mg | ORAL_TABLET | Freq: Every evening | ORAL | Status: DC | PRN

## 2013-03-27 NOTE — Telephone Encounter (Signed)
Ok to refill Remus Loffler,  Authorized in epic

## 2013-03-27 NOTE — Telephone Encounter (Signed)
Phoned in Ambien refill

## 2013-03-27 NOTE — Telephone Encounter (Signed)
Needs Ambien refill. Please advise .

## 2013-03-27 NOTE — Addendum Note (Signed)
Addended by: Sherlene Shams on: 03/27/2013 05:15 PM   Modules accepted: Orders

## 2013-03-27 NOTE — Telephone Encounter (Signed)
Patient left message on voicemail about needing a rx refill. Called back to clarify which medication she needs refilled left message for patient to return call to office. I did see that her Ambien was sent on 03/24/13 not sure if that was the rx she needed called in.

## 2013-04-10 LAB — HM PAP SMEAR: HM Pap smear: NORMAL

## 2013-06-22 ENCOUNTER — Other Ambulatory Visit: Payer: Self-pay | Admitting: *Deleted

## 2013-06-22 DIAGNOSIS — E785 Hyperlipidemia, unspecified: Secondary | ICD-10-CM

## 2013-06-23 ENCOUNTER — Telehealth: Payer: Self-pay | Admitting: *Deleted

## 2013-06-23 MED ORDER — ROSUVASTATIN CALCIUM 20 MG PO TABS: 20.0000 mg | ORAL_TABLET | Freq: Every day | ORAL | Status: DC

## 2013-06-23 NOTE — Telephone Encounter (Signed)
Called [employer] [place] for prior authorization of Crestor 20 mg tab, received form gave to [employer]

## 2013-06-24 ENCOUNTER — Telehealth: Payer: Self-pay | Admitting: Internal Medicine

## 2013-06-24 NOTE — Telephone Encounter (Signed)
Script called in as requested.

## 2013-06-24 NOTE — Telephone Encounter (Signed)
Her insurance will no longer pay for her crestor.  They are requiring a switch to a covered alternative like atorvastatin   Due to the volume of these mandated changes we have been inundated with for every patient , we simply cannot devote the time (20 minutes per request per patient) to do prior authorizations unless it is absolutely necessary.  Therefore I would like her to try the new medication before making any requests for prior authorizations.    Atorvastatin 40 mg one tablet dialy  #90 please call in

## 2013-06-30 ENCOUNTER — Ambulatory Visit: Payer: BC Managed Care – PPO | Admitting: Internal Medicine

## 2013-07-07 ENCOUNTER — Ambulatory Visit (INDEPENDENT_AMBULATORY_CARE_PROVIDER_SITE_OTHER): Payer: BC Managed Care – PPO | Admitting: Internal Medicine

## 2013-07-07 ENCOUNTER — Encounter: Payer: Self-pay | Admitting: Internal Medicine

## 2013-07-07 VITALS — BP 126/68 | HR 85 | Temp 98.7°F | Resp 14 | Wt 140.0 lb

## 2013-07-07 DIAGNOSIS — N951 Menopausal and female climacteric states: Secondary | ICD-10-CM

## 2013-07-07 DIAGNOSIS — E079 Disorder of thyroid, unspecified: Secondary | ICD-10-CM

## 2013-07-07 DIAGNOSIS — E785 Hyperlipidemia, unspecified: Secondary | ICD-10-CM

## 2013-07-07 DIAGNOSIS — R5383 Other fatigue: Secondary | ICD-10-CM

## 2013-07-07 DIAGNOSIS — E039 Hypothyroidism, unspecified: Secondary | ICD-10-CM

## 2013-07-07 DIAGNOSIS — I739 Peripheral vascular disease, unspecified: Secondary | ICD-10-CM

## 2013-07-07 DIAGNOSIS — R5381 Other malaise: Secondary | ICD-10-CM

## 2013-07-07 DIAGNOSIS — Z1239 Encounter for other screening for malignant neoplasm of breast: Secondary | ICD-10-CM

## 2013-07-07 DIAGNOSIS — Z780 Asymptomatic menopausal state: Secondary | ICD-10-CM

## 2013-07-07 MED ORDER — CONJ ESTROG-MEDROXYPROGEST ACE 0.3-1.5 MG PO TABS: 1.0000 | ORAL_TABLET | Freq: Every day | ORAL | Status: DC

## 2013-07-07 NOTE — Assessment & Plan Note (Addendum)
Her pulse is difficult to palpate in the left foot. I am referring her to [facility] for evaluation and [place] [facility] for risk stratification

## 2013-07-07 NOTE — Assessment & Plan Note (Signed)
Repeating thyroid levels given her excessive fatigue.

## 2013-07-07 NOTE — Progress Notes (Signed)
Patient ID: Tracie Morris, female   DOB: [birth year], 57 y.o.   MRN: [id number]   Patient Active Problem List   Diagnosis Date Noted  . Post menopausal syndrome 07/07/2013  . Peripheral vascular disease with pain at rest 05/16/2012  . Screening for cervical cancer 10/20/2011  . Screening for breast cancer 10/20/2011  . Screening for colon cancer 10/20/2011  . Thyroid cancer   . hypothyroidism   . Hyperlipidemia   . Migraine     Subjective:  CC:   Chief Complaint  Patient presents with  . Follow-up    medication refill    HPI:   Tracie Morris a 57 y.o. female who presents 6 month follow up on chronic conditions including PAD, hypothyroidism, and post menopausal syndrome.     She feels miserable.  She reports constant fatigue poor sleep, decreased concentration is off, left leg aching.  Her prior angioplasty by Dr Wyn Quaker was done but was not covered by [employer] so she has not been able to follow up with him .  She has  Developed pain with prlonged standing and walking,  Hip joint has already been evaluated. She will need to see another vascular surgeon unlress she wants to pay out of pocket. She is working 3 days per week 12 hours  X 2 6 hours x 1,  But usually iti is 3 x 12 hour days  Not eating well .   She stopped her HRT last December and has not been sleeping well since then, and her concentration has been off.  .    Past Medical History  Diagnosis Date  . Hypocalcemia   . Migraine     improved since thyroid surgery, 2 years  . Thyroid cancer 2007    s/p thyroidectomy  . Hyperlipidemia   . hypothyroidism 2007    acquired, secondary to surgery for thyroid Ca    Past Surgical History  Procedure Laterality Date  . Thyroid surgery  2007    secondary to thyroid cancer       The following portions of the patient's history were reviewed and updated as appropriate: Allergies, current medications, and problem list.    Review of Systems:   Patient denies  headache, fevers,  unintentional weight loss, skin rash, eye pain, sinus congestion and sinus pain, sore throat, dysphagia,  hemoptysis , cough, dyspnea, wheezing, chest pain, palpitations, orthopnea, edema, abdominal pain, nausea, melena, diarrhea, constipation, flank pain, dysuria, hematuria, urinary  Frequency, nocturia, numbness, tingling, seizures,  Focal weakness, Loss of consciousness,  Tremor, insomnia, depression, anxiety, and suicidal ideation.     History   Social History  . Marital Status: Married    Spouse Name: N/A    Number of Children: N/A  . Years of Education: N/A   Occupational History  . Not on file.   Social History Main Topics  . Smoking status: Former Smoker -- 1.00 packs/day    Types: Cigarettes    Quit date: 10/28/2011  . Smokeless tobacco: Never Used  . Alcohol Use: No  . Drug Use: No  . Sexually Active: Not on file   Other Topics Concern  . Not on file   Social History Narrative  . No narrative on file    Objective:  Filed Vitals:   07/07/13 1520  BP: 126/68  Pulse: 85  Temp: 98.7 F (37.1 C)  Resp: 14     General appearance: alert, cooperative and appears stated age Ears: normal TM's and external ear canals  both ears Throat: lips, mucosa, and tongue normal; teeth and gums normal Neck: no adenopathy, no carotid bruit, supple, symmetrical, trachea midline and thyroid not enlarged, symmetric, no tenderness/mass/nodules Back: symmetric, no curvature. ROM normal. No CVA tenderness. Lungs: clear to auscultation bilaterally Heart: regular rate and rhythm, S1, S2 normal, no murmur, click, rub or gallop Abdomen: soft, non-tender; bowel sounds normal; no masses,  no organomegaly Pulses: 2+ and symmetric Skin: Skin color, texture, turgor normal. No rashes or lesions Lymph nodes: Cervical, supraclavicular, and axillary nodes normal.  Assessment and Plan:  Peripheral vascular disease with pain at rest Her pulse is difficult to palpate in the  left foot. I am referring her to [facility] for evaluation and [place] cardiology for risk stratification   hypothyroidism Repeating thyroid levels given her excessive fatigue.   Post menopausal syndrome Resuming low dose prempro for a month to see if symptoms improve.   Hyperlipidemia Tolerating crestor.     Updated Medication List Outpatient Encounter Prescriptions as of 07/07/2013  Medication Sig Dispense Refill  . butorphanol (STADOL) 10 MG/ML nasal spray Place 1 spray into the nose Once PRN for pain.  2.5 mL  2  . calcium carbonate (OS-CAL) 600 MG TABS Take 600 mg by mouth 2 (two) times daily with a meal.        . cholecalciferol (VITAMIN D) 1000 UNITS tablet Take 1,000 Units by mouth daily.      Marland Kitchen levothyroxine (SYNTHROID, LEVOTHROID) 150 MCG tablet Take 1 tablet (150 mcg total) by mouth daily.  90 tablet  3  . Multiple Vitamin (MULTIVITAMIN) tablet Take 1 tablet by mouth daily.        . rizatriptan (MAXALT-MLT) 10 MG disintegrating tablet Take 1 tablet (10 mg total) by mouth as needed. May repeat in 2 hours if needed  10 tablet  3  . rosuvastatin (CRESTOR) 20 MG tablet Take 1 tablet (20 mg total) by mouth daily.  30 tablet  5  . zolpidem (AMBIEN) 10 MG tablet Take 1 tablet (10 mg total) by mouth at bedtime as needed for sleep.  30 tablet  3  . estrogen, conjugated,-medroxyprogesterone (PREMPRO) 0.3-1.5 MG per tablet Take 1 tablet by mouth daily.  30 tablet  3   No facility-administered encounter medications on file as of 07/07/2013.     Orders Placed This Encounter  Procedures  . MM Digital Screening  . TSH + free T4  . Comprehensive metabolic panel  . Ambulatory referral to Vascular Surgery  . Ambulatory referral to Cardiology    No Follow-up on file.

## 2013-07-07 NOTE — Assessment & Plan Note (Signed)
Tolerating crestor.  ?

## 2013-07-07 NOTE — Assessment & Plan Note (Signed)
Resuming low dose prempro for a month to see if symptoms improve.

## 2013-07-07 NOTE — Patient Instructions (Addendum)
Referral to [facility]  For  follow up on your left leg  Referral to [facility] cardiology for a cardiac evaluation  Trial of low dose prempro to see if it helps your sleep  Will call you with the labs  Mammogram to be scheduled at [facility]

## 2013-07-08 LAB — COMPREHENSIVE METABOLIC PANEL
AST: 24 U/L (ref 0–37)
Albumin: 4 g/dL (ref 3.5–5.2)
Alkaline Phosphatase: 100 U/L (ref 39–117)
Glucose, Bld: 71 mg/dL (ref 70–99)
Potassium: 4.4 mEq/L (ref 3.5–5.1)
Sodium: 140 mEq/L (ref 135–145)
Total Protein: 7.5 g/dL (ref 6.0–8.3)

## 2013-07-09 ENCOUNTER — Encounter: Payer: Self-pay | Admitting: Internal Medicine

## 2013-07-09 LAB — TSH+FREE T4: TSH: 1.23 u[IU]/mL (ref 0.450–4.500)

## 2013-07-28 ENCOUNTER — Encounter: Payer: Self-pay | Admitting: Cardiovascular Disease

## 2013-07-28 ENCOUNTER — Other Ambulatory Visit: Payer: Self-pay | Admitting: *Deleted

## 2013-07-28 ENCOUNTER — Ambulatory Visit (INDEPENDENT_AMBULATORY_CARE_PROVIDER_SITE_OTHER): Payer: BC Managed Care – PPO | Admitting: Cardiovascular Disease

## 2013-07-28 VITALS — BP 108/72 | HR 91 | Ht 67.0 in | Wt 138.2 lb

## 2013-07-28 DIAGNOSIS — R5383 Other fatigue: Secondary | ICD-10-CM

## 2013-07-28 DIAGNOSIS — I739 Peripheral vascular disease, unspecified: Secondary | ICD-10-CM

## 2013-07-28 DIAGNOSIS — R0602 Shortness of breath: Secondary | ICD-10-CM

## 2013-07-28 DIAGNOSIS — R5381 Other malaise: Secondary | ICD-10-CM

## 2013-07-28 DIAGNOSIS — R0789 Other chest pain: Secondary | ICD-10-CM

## 2013-07-28 MED ORDER — ASPIRIN 81 MG PO TABS: 81.0000 mg | ORAL_TABLET | Freq: Every day | ORAL | Status: DC

## 2013-07-28 NOTE — Patient Instructions (Addendum)
Your physician has requested that you have an exercise tolerance test. For further information please visit [email or web address] Please also follow instruction sheet, as given.  Your physician has requested that you have an ankle brachial index (ABI). During this test an ultrasound and blood pressure cuff are used to evaluate the arteries that supply the arms and legs with blood. Allow thirty minutes for this exam. There are no restrictions or special instructions.  Your physician has requested that you have an abdominal aorta duplex. During this test, an ultrasound is used to evaluate the aorta. Allow 30 minutes for this exam. Do not eat after midnight the day before and avoid carbonated beverages  Start Aspirin 81 mg once daily  Follow up after tests

## 2013-07-28 NOTE — Progress Notes (Signed)
HPI  This is a pleasant 57 year old Caucasian female who was referred by Dr. Darrick Huntsman for cardiac evaluation. She has no previous cardiac history but does have risk factors that include previous tobacco use, hyperlipidemia and established history of peripheral arterial disease. She started having left hip and back discomfort with walking in 2008. She was evaluated by Dr. Wyn Quaker early this year. She underwent angiography which showed an occluded left common iliac artery. An angioplasty (7 mm balloon) without stent placement was performed via the right common femoral artery. There was residual 40-50% stenosis at the ostium of the left common iliac artery which could not be treated with a stent from the right femoral artery approach. The patient was already heparinized and thus access was not obtained via the left femoral artery. After the procedure, she reports partial improvement in her symptoms but not complete resolution. She continues to have lower back, left buttock and thigh discomfort with walking. It turned out that Dr. Wyn Quaker was not in insurance network and thus followup visits were not covered. She has not had surveillance after the procedure. She reports he cannot episodes of "twinges"in her chest with exertional dyspnea. No previous cardiac evaluation.  Allergies  Allergen Reactions  . Darvocet [Propoxyphene-Acetaminophen]   . Demerol      Current Outpatient Prescriptions on File Prior to Visit  Medication Sig Dispense Refill  . butorphanol (STADOL) 10 MG/ML nasal spray Place 1 spray into the nose Once PRN for pain.  2.5 mL  2  . calcium carbonate (OS-CAL) 600 MG TABS Take 600 mg by mouth 2 (two) times daily with a meal.        . estrogen, conjugated,-medroxyprogesterone (PREMPRO) 0.3-1.5 MG per tablet Take 1 tablet by mouth daily.  30 tablet  3  . levothyroxine (SYNTHROID, LEVOTHROID) 150 MCG tablet Take 1 tablet (150 mcg total) by mouth daily.  90 tablet  3  . Multiple Vitamin  (MULTIVITAMIN) tablet Take 1 tablet by mouth daily.        . rizatriptan (MAXALT-MLT) 10 MG disintegrating tablet Take 1 tablet (10 mg total) by mouth as needed. May repeat in 2 hours if needed  10 tablet  3  . rosuvastatin (CRESTOR) 20 MG tablet Take 1 tablet (20 mg total) by mouth daily.  30 tablet  5  . zolpidem (AMBIEN) 10 MG tablet Take 1 tablet (10 mg total) by mouth at bedtime as needed for sleep.  30 tablet  3   No current facility-administered medications on file prior to visit.     Past Medical History  Diagnosis Date  . Hypocalcemia   . Migraine     improved since thyroid surgery, 2 years  . Thyroid cancer 2007    s/p thyroidectomy  . Hyperlipidemia   . hypothyroidism 2007    acquired, secondary to surgery for thyroid Ca  . Carotid artery occlusion   . 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 Thyroid surgery  2007    secondary to thyroid cancer  . Angioplasty illiac artery    . Cesarean section    . Nose surgery       Family History  Problem Relation Age of Onset  . Coronary artery disease Maternal Grandmother   . Heart disease Maternal Grandmother 70  . Coronary artery disease Paternal Grandmother 14  .  Heart disease Paternal Grandmother 25  . Alzheimer's disease Father   . Stroke Mother      History   Social History  . Marital Status: Married    Spouse Name: N/A    Number of Children: N/A  . Years of Education: N/A   Occupational History  . Not on file.   Social History Main Topics  . Smoking status: Former Smoker -- 1.00 packs/day for 20 years    Types: Cigarettes    Quit date: 10/28/2011  . Smokeless tobacco: Never Used  . Alcohol Use: No  . Drug Use: No  . Sexual Activity: Not on file   Other Topics Concern  . Not on file   Social History Narrative  . No narrative on file      ROS Constitutional: Negative for fever, chills, diaphoresis, activity change, appetite change and fatigue.  HENT: Negative for hearing loss, nosebleeds, congestion, sore throat, facial swelling, drooling, trouble swallowing, neck pain, voice change, sinus pressure and tinnitus.  Eyes: Negative for photophobia, pain, discharge and visual disturbance.  Respiratory: Negative for apnea, cough and wheezing.  Cardiovascular: Negative for  leg swelling.  Gastrointestinal: Negative for nausea, vomiting, abdominal pain, diarrhea, constipation, blood in stool and abdominal distention.  Genitourinary: Negative for dysuria, urgency, frequency, hematuria and decreased urine volume.  Skin: Negative for color change, pallor, rash and wound.  Neurological: Negative for dizziness, tremors, seizures, syncope, speech difficulty, weakness, light-headedness, numbness and headaches.  Psychiatric/Behavioral: Negative for suicidal ideas, hallucinations, behavioral problems and agitation. The patient is not nervous/anxious.     PHYSICAL EXAM   BP 108/72  Pulse 91  Ht 5\' 7"  (1.702 m)  Wt 138 lb 4 oz (62.71 kg)  BMI 21.65 kg/m2 Constitutional: She is oriented to person, place, and time. She appears well-developed and well-nourished. No distress.  HENT: No nasal discharge.  Head: Normocephalic and atraumatic.  Eyes: Pupils are equal and round. Right eye exhibits no discharge. Left eye exhibits no discharge.  Neck: Normal range of motion. Neck supple. No JVD present. No thyromegaly present. No carotid bruits. Cardiovascular: Normal rate, regular rhythm, normal heart sounds. Exam reveals no gallop and no friction rub. No murmur heard.  Pulmonary/Chest: Effort normal and breath sounds normal. No stridor. No respiratory distress. She has no wheezes. She has no rales. She exhibits no tenderness.  Abdominal: Soft. Bowel sounds are normal. She exhibits no distension. There is no tenderness. There is no rebound and  no guarding.  Musculoskeletal: Normal range of motion. She exhibits no edema and no tenderness.  Neurological: She is alert and oriented to person, place, and time. Coordination normal.  Skin: Skin is warm and dry. No rash noted. She is not diaphoretic. No erythema. No pallor.  Psychiatric: She has a normal mood and affect. Her behavior is normal. Judgment and thought content normal.  Vascular: Radial pulses are normal bilaterally. Femoral pulse: Normal on the right side and slightly diminished on the left side. Posterior tibial: Normal bilaterally. Dorsalis pedis: Normal on the right side and absent on the left side.   ZOX:WRUEA  Rhythm  Rightward P/QRS axis and rotation -possible pulmonary disease.   ABNORMAL     ASSESSMENT AND PLAN

## 2013-07-28 NOTE — Assessment & Plan Note (Signed)
The patient had previous balloon angioplasty of the left common iliac artery without stent placement. High risk for restenosis given that the artery was occluded. She continued to have residual claudication after the procedure with 40 to 50% residual stenosis after balloon angioplasty.  I recommend evaluation with an ABI as well as aortoiliac duplex. Resume aspirin 81 mg once daily.  Followup after testing.

## 2013-07-28 NOTE — Assessment & Plan Note (Signed)
The patient reports atypical chest pain with exertional dyspnea. She has multiple risk factors for coronary artery disease as well as an established history of atherosclerosis with peripheral arterial disease. Baseline EKG does not show significant ischemic changes. I recommend evaluation with a treadmill stress test.  Continue aggressive treatment of risk factors. I advised her to start taking aspirin 81 mg once daily.

## 2013-07-29 ENCOUNTER — Other Ambulatory Visit: Payer: Self-pay | Admitting: *Deleted

## 2013-07-30 MED ORDER — BUTORPHANOL TARTRATE 10 MG/ML NA SOLN: 1.0000 | Freq: Once | NASAL | Status: DC | PRN

## 2013-07-30 NOTE — Telephone Encounter (Signed)
Rx faxed to pharmacy  

## 2013-08-11 ENCOUNTER — Ambulatory Visit (INDEPENDENT_AMBULATORY_CARE_PROVIDER_SITE_OTHER): Payer: BC Managed Care – PPO | Admitting: Cardiovascular Disease

## 2013-08-11 ENCOUNTER — Encounter: Payer: Self-pay | Admitting: Cardiovascular Disease

## 2013-08-11 DIAGNOSIS — R0789 Other chest pain: Secondary | ICD-10-CM

## 2013-08-11 NOTE — Procedures (Signed)
    Treadmill Stress test  Indication: Atypical chest pain  Baseline Data:  Resting EKG shows NSR with rate of 105 bpm, no significant ST or T wave changes Resting blood pressure of 132/80 mm Hg Stand bruce protocal was used.  Exercise Data:  Patient exercised for 6 min 21 sec,  Peak heart rate of 154 bpm.  This was 95 % of the maximum predicted heart rate. No symptoms of chest pain or lightheadedness were reported at peak stress or in recovery. She did have significant left leg claudication. Peak Blood pressure recorded was 200/88 Maximal work level: 7 METs.  Heart rate at 3 minutes in recovery was 114 bpm. BP response: Hypertensive HR response: Normal  EKG with Exercise: Sinus tachycardia with 0.5 mm of upsloping ST depression in the inferior leads which is nondiagnostic for ischemia.  FINAL IMPRESSION: Normal exercise stress test. No significant EKG changes concerning for ischemia. Below average exercise tolerance with a hypertensive response to exercise and poor heart rate recovery suggestive of deconditioning. Left leg claudication.  Recommendation: She is already scheduled for vascular ultrasound. Regular exercise is recommended to improve physical conditioning.

## 2013-08-11 NOTE — Patient Instructions (Addendum)
Your stress test is normal.  Follow up as planned.

## 2013-09-08 ENCOUNTER — Ambulatory Visit: Payer: BC Managed Care – PPO | Admitting: Cardiovascular Disease

## 2013-09-10 LAB — HM MAMMOGRAPHY: HM Mammogram: NORMAL

## 2013-09-14 ENCOUNTER — Ambulatory Visit: Payer: Self-pay | Admitting: Internal Medicine

## 2013-09-14 ENCOUNTER — Other Ambulatory Visit: Payer: Self-pay | Admitting: *Deleted

## 2013-09-15 MED ORDER — ZOLPIDEM TARTRATE 10 MG PO TABS: 10.0000 mg | ORAL_TABLET | Freq: Every evening | ORAL | Status: DC | PRN

## 2013-09-21 ENCOUNTER — Telehealth: Payer: Self-pay | Admitting: Internal Medicine

## 2013-09-21 NOTE — Telephone Encounter (Signed)
Script called to pharmacy and verified. Script marked as phone- in in [place].

## 2013-09-21 NOTE — Telephone Encounter (Signed)
The patient is off today and she would like this medication (zolpidem (AMBIEN) 10 MG tablet )  phoned into the pharmacy. She has been waiting since last Monday.

## 2013-09-23 ENCOUNTER — Encounter (INDEPENDENT_AMBULATORY_CARE_PROVIDER_SITE_OTHER): Payer: BC Managed Care – PPO

## 2013-09-23 DIAGNOSIS — I739 Peripheral vascular disease, unspecified: Secondary | ICD-10-CM

## 2013-09-29 ENCOUNTER — Encounter (INDEPENDENT_AMBULATORY_CARE_PROVIDER_SITE_OTHER): Payer: BC Managed Care – PPO

## 2013-09-29 DIAGNOSIS — I739 Peripheral vascular disease, unspecified: Secondary | ICD-10-CM

## 2013-10-01 ENCOUNTER — Other Ambulatory Visit: Payer: Self-pay

## 2013-10-05 ENCOUNTER — Encounter: Payer: Self-pay | Admitting: Internal Medicine

## 2013-10-05 ENCOUNTER — Ambulatory Visit (INDEPENDENT_AMBULATORY_CARE_PROVIDER_SITE_OTHER): Payer: BC Managed Care – PPO | Admitting: Cardiovascular Disease

## 2013-10-05 ENCOUNTER — Encounter: Payer: Self-pay | Admitting: Cardiovascular Disease

## 2013-10-05 ENCOUNTER — Encounter: Payer: Self-pay | Admitting: Cardiology

## 2013-10-05 ENCOUNTER — Encounter (HOSPITAL_COMMUNITY): Payer: Self-pay

## 2013-10-05 VITALS — BP 126/72 | HR 80 | Ht 67.0 in | Wt 137.2 lb

## 2013-10-05 DIAGNOSIS — Z01810 Encounter for preprocedural cardiovascular examination: Secondary | ICD-10-CM

## 2013-10-05 DIAGNOSIS — E785 Hyperlipidemia, unspecified: Secondary | ICD-10-CM

## 2013-10-05 DIAGNOSIS — I739 Peripheral vascular disease, unspecified: Secondary | ICD-10-CM

## 2013-10-05 DIAGNOSIS — R0789 Other chest pain: Secondary | ICD-10-CM

## 2013-10-05 MED ORDER — CLOPIDOGREL BISULFATE 75 MG PO TABS: 75.0000 mg | ORAL_TABLET | Freq: Every day | ORAL | Status: DC

## 2013-10-05 NOTE — Patient Instructions (Addendum)
  Start Plavix 75 mg once daily.   Your physician has requested that you have a peripheral vascular angiogram. This exam is performed at the hospital. During this exam IV contrast is used to look at arterial blood flow. Please review the information sheet given for details. Scheduled 10/14/2013.  Your physician recommends that you have labs today: BMET, CBC, PTINR

## 2013-10-05 NOTE — Progress Notes (Signed)
Primary care physician: Dr. Darrick Huntsman.   HPI  This is a pleasant 58 year old Caucasian female who is here today for a followup visit.  She has no previous cardiac history but does have risk factors that include previous tobacco use, hyperlipidemia and established history of peripheral arterial disease. She started having left hip and back discomfort with walking in 2008. She was evaluated by Dr. Wyn Quaker early this year. She underwent angiography which showed an occluded left common iliac artery. An angioplasty (7 mm balloon) without stent placement was performed via the right common femoral artery. There was residual 40-50% stenosis at the ostium of the left common iliac artery which could not be treated with a stent from the right femoral artery approach. The patient was already heparinized and thus access was not obtained via the left femoral artery. After the procedure, she reports partial improvement in her symptoms but not complete resolution. She continues to have lower back, left buttock and thigh discomfort with walking. Although this is not severe, it significantly affects her lifestyle given that she is very active.  Allergies  Allergen Reactions  . Darvocet [Propoxyphene-Acetaminophen]   . Demerol      Current Outpatient Prescriptions on File Prior to Visit  Medication Sig Dispense Refill  . aspirin 81 MG tablet Take 1 tablet (81 mg total) by mouth daily.  30 tablet    . butorphanol (STADOL) 10 MG/ML nasal spray Place 1 spray into the nose Once PRN for pain.  2.5 mL  2  . calcium carbonate (OS-CAL) 600 MG TABS Take 600 mg by mouth 2 (two) times daily with a meal.        . Cholecalciferol (VITAMIN D) 2000 UNITS tablet Take 2,000 Units by mouth daily.      Marland Kitchen levothyroxine (SYNTHROID, LEVOTHROID) 150 MCG tablet Take 1 tablet (150 mcg total) by mouth daily.  90 tablet  3  . Multiple Vitamin (MULTIVITAMIN) tablet Take 1 tablet by mouth daily.        . rizatriptan (MAXALT-MLT) 10 MG  disintegrating tablet Take 1 tablet (10 mg total) by mouth as needed. May repeat in 2 hours if needed  10 tablet  3  . rosuvastatin (CRESTOR) 20 MG tablet Take 1 tablet (20 mg total) by mouth daily.  30 tablet  5  . zolpidem (AMBIEN) 10 MG tablet Take 1 tablet (10 mg total) by mouth at bedtime as needed for sleep.  30 tablet  3   No current facility-administered medications on file prior to visit.     Past Medical History  Diagnosis Date  . Hypocalcemia   . Migraine     improved since thyroid surgery, 2 years  . Thyroid cancer 2007    s/p thyroidectomy  . Hyperlipidemia   . hypothyroidism 2007    acquired, secondary to surgery for thyroid Ca  . Carotid artery occlusion   . 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 Thyroid surgery  2007    secondary to thyroid cancer  . Angioplasty illiac artery    . Cesarean section    . Nose surgery       Family History  Problem Relation Age of Onset  . Coronary artery disease Maternal Grandmother   . Heart disease Maternal Grandmother 70  . Coronary artery disease Paternal Grandmother 20  .  Heart disease Paternal Grandmother 62  . Alzheimer's disease Father   . Stroke Mother      History   Social History  . Marital Status: Married    Spouse Name: N/A    Number of Children: N/A  . Years of Education: N/A   Occupational History  . Not on file.   Social History Main Topics  . Smoking status: Former Smoker -- 1.00 packs/day for 20 years    Types: Cigarettes    Quit date: 10/28/2011  . Smokeless tobacco: Never Used  . Alcohol Use: No  . Drug Use: No  . Sexual Activity: Not on file   Other Topics Concern  . Not on file   Social History Narrative  . No narrative on file       PHYSICAL EXAM   BP 126/72  Pulse 80  Ht 5\' 7"  (1.702 m)  Wt 137 lb 4 oz (62.256  kg)  BMI 21.49 kg/m2 Constitutional: She is oriented to person, place, and time. She appears well-developed and well-nourished. No distress.  HENT: No nasal discharge.  Head: Normocephalic and atraumatic.  Eyes: Pupils are equal and round. Right eye exhibits no discharge. Left eye exhibits no discharge.  Neck: Normal range of motion. Neck supple. No JVD present. No thyromegaly present. No carotid bruits. Cardiovascular: Normal rate, regular rhythm, normal heart sounds. Exam reveals no gallop and no friction rub. No murmur heard.  Pulmonary/Chest: Effort normal and breath sounds normal. No stridor. No respiratory distress. She has no wheezes. She has no rales. She exhibits no tenderness.  Abdominal: Soft. Bowel sounds are normal. She exhibits no distension. There is no tenderness. There is no rebound and no guarding.  Musculoskeletal: Normal range of motion. She exhibits no edema and no tenderness.  Neurological: She is alert and oriented to person, place, and time. Coordination normal.  Skin: Skin is warm and dry. No rash noted. She is not diaphoretic. No erythema. No pallor.  Psychiatric: She has a normal mood and affect. Her behavior is normal. Judgment and thought content normal.  Vascular: Radial pulses are normal bilaterally. Femoral pulse: Normal on the right side and mildly diminished on the left side. Posterior tibial: Normal bilaterally. Dorsalis pedis: Normal on the right side and absent on the left side.      ASSESSMENT AND PLAN

## 2013-10-06 ENCOUNTER — Other Ambulatory Visit: Payer: Self-pay | Admitting: Cardiovascular Disease

## 2013-10-06 DIAGNOSIS — I739 Peripheral vascular disease, unspecified: Secondary | ICD-10-CM

## 2013-10-06 LAB — BASIC METABOLIC PANEL
CO2: 24 mmol/L (ref 18–29)
Calcium: 10.1 mg/dL (ref 8.7–10.2)
Creatinine, Ser: 0.79 mg/dL (ref 0.57–1.00)
GFR calc Af Amer: 95 mL/min/{1.73_m2} (ref >59)
GFR calc non Af Amer: 83 mL/min/{1.73_m2} (ref >59)
Potassium: 4.5 mmol/L (ref 3.5–5.2)
Sodium: 142 mmol/L (ref 134–144)

## 2013-10-06 LAB — CBC WITH DIFFERENTIAL/PLATELET
Eos: 2 %
HCT: 44.8 % (ref 34.0–46.6)
Hemoglobin: 15.9 g/dL (ref 11.1–15.9)
Immature Granulocytes: 0 %
Lymphocytes Absolute: 3.7 10*3/uL — ABNORMAL HIGH (ref 0.7–3.1)
Lymphs: 39 %
MCHC: 35.5 g/dL (ref 31.5–35.7)
MCV: 94 fL (ref 79–97)
Monocytes Absolute: 0.7 10*3/uL (ref 0.1–0.9)
Monocytes: 7 %
Neutrophils Absolute: 4.7 10*3/uL (ref 1.4–7.0)
RDW: 13.1 % (ref 12.3–15.4)
WBC: 9.4 10*3/uL (ref 3.4–10.8)

## 2013-10-06 LAB — PROTIME-INR
INR: 1 (ref 0.8–1.2)
Prothrombin Time: 10.6 s (ref 9.1–12.0)

## 2013-10-06 NOTE — Assessment & Plan Note (Signed)
Recent stress test was negative. She reports resolution of symptoms.

## 2013-10-06 NOTE — Assessment & Plan Note (Signed)
Lab Results  Component Value Date   CHOL 161 01/05/2013   HDL 35.30* 01/05/2013   LDLCALC 104* 01/05/2013   TRIG 107.0 01/05/2013   CHOLHDL 5 01/05/2013   Continue treatment with Crestor given had established history of atherosclerosis.

## 2013-10-06 NOTE — Assessment & Plan Note (Signed)
The patient has moderate lifestyle limiting claudication due to left common iliac disease. Duplex ultrasound showed significant residual stenosis with a peak velocity of 325. I discussed with her different management options including continued observation versus proceeding with endovascular intervention with the intention to place a stent given the suboptimal results with balloon angioplasty only. I will add Plavix today. She is a candidate for the Endomax trial.  Plan access is via the left femoral artery.

## 2013-10-14 ENCOUNTER — Ambulatory Visit (HOSPITAL_COMMUNITY)
Admission: RE | Admit: 2013-10-14 | Discharge: 2013-10-14 | Disposition: A | Discharge status: Home / Self Care | Payer: BC Managed Care – PPO | Source: Ambulatory Visit | Attending: Cardiovascular Disease | Admitting: Cardiovascular Disease

## 2013-10-14 ENCOUNTER — Encounter (HOSPITAL_COMMUNITY)
Admission: RE | Disposition: A | Discharge status: Home / Self Care | Payer: Self-pay | Source: Ambulatory Visit | Attending: Cardiovascular Disease

## 2013-10-14 DIAGNOSIS — I739 Peripheral vascular disease, unspecified: Secondary | ICD-10-CM | POA: Insufficient documentation

## 2013-10-14 DIAGNOSIS — I708 Atherosclerosis of other arteries: Secondary | ICD-10-CM | POA: Insufficient documentation

## 2013-10-14 DIAGNOSIS — E785 Hyperlipidemia, unspecified: Secondary | ICD-10-CM | POA: Insufficient documentation

## 2013-10-14 DIAGNOSIS — Z85850 Personal history of malignant neoplasm of thyroid: Secondary | ICD-10-CM | POA: Insufficient documentation

## 2013-10-14 DIAGNOSIS — Z87891 Personal history of nicotine dependence: Secondary | ICD-10-CM | POA: Insufficient documentation

## 2013-10-14 DIAGNOSIS — I70219 Atherosclerosis of native arteries of extremities with intermittent claudication, unspecified extremity: Secondary | ICD-10-CM

## 2013-10-14 DIAGNOSIS — Z7982 Long term (current) use of aspirin: Secondary | ICD-10-CM | POA: Insufficient documentation

## 2013-10-14 DIAGNOSIS — Z9089 Acquired absence of other organs: Secondary | ICD-10-CM | POA: Insufficient documentation

## 2013-10-14 HISTORY — PX: ABDOMINAL AORTAGRAM: SHX5454

## 2013-10-14 LAB — CBC
HCT: 43 % (ref 36.0–46.0)
Hemoglobin: 15.1 g/dL — ABNORMAL HIGH (ref 12.0–15.0)
RDW: 12.6 % (ref 11.5–15.5)
WBC: 10.8 10*3/uL — ABNORMAL HIGH (ref 4.0–10.5)

## 2013-10-14 LAB — PROTIME-INR
INR: 0.96 (ref 0.00–1.49)
Prothrombin Time: 12.6 seconds (ref 11.6–15.2)

## 2013-10-14 SURGERY — ABDOMINAL AORTAGRAM
Anesthesia: LOCAL

## 2013-10-14 MED ORDER — HEPARIN (PORCINE) IN NACL 2-0.9 UNIT/ML-% IJ SOLN
INTRAMUSCULAR | Status: AC
  Filled 2013-10-14: qty 1000

## 2013-10-14 MED ORDER — LIDOCAINE HCL (PF) 1 % IJ SOLN
INTRAMUSCULAR | Status: AC
  Filled 2013-10-14: qty 30

## 2013-10-14 MED ORDER — SODIUM CHLORIDE 0.9 % IV SOLN: INTRAVENOUS | Status: DC

## 2013-10-14 MED ORDER — SODIUM CHLORIDE 0.9 % IJ SOLN: 3.0000 mL | Freq: Two times a day (BID) | INTRAMUSCULAR | Status: DC

## 2013-10-14 MED ORDER — MIDAZOLAM HCL 2 MG/2ML IJ SOLN
INTRAMUSCULAR | Status: AC
  Filled 2013-10-14: qty 2

## 2013-10-14 MED ORDER — SODIUM CHLORIDE 0.9 % IJ SOLN: 3.0000 mL | INTRAMUSCULAR | Status: DC | PRN

## 2013-10-14 MED ORDER — ASPIRIN 81 MG PO CHEW
81.0000 mg | CHEWABLE_TABLET | ORAL | Status: AC
  Administered 2013-10-14: 81 mg via ORAL

## 2013-10-14 MED ORDER — FENTANYL CITRATE 0.05 MG/ML IJ SOLN
INTRAMUSCULAR | Status: AC
  Filled 2013-10-14: qty 2

## 2013-10-14 MED ORDER — ASPIRIN 81 MG PO CHEW
CHEWABLE_TABLET | ORAL | Status: AC
  Filled 2013-10-14: qty 1

## 2013-10-14 MED ORDER — SODIUM CHLORIDE 0.9 % IV SOLN: 250.0000 mL | INTRAVENOUS | Status: DC | PRN

## 2013-10-14 MED ORDER — SODIUM CHLORIDE 0.9 % IV SOLN
INTRAVENOUS | Status: DC
  Administered 2013-10-14: 07:00:00 via INTRAVENOUS

## 2013-10-14 NOTE — Interval H&P Note (Signed)
History and Physical Interval Note:  10/14/2013 8:46 AM  Tracie Morris  has presented today for surgery, with the diagnosis of Claudication  The various methods of treatment have been discussed with the patient and family. After consideration of risks, benefits and other options for treatment, the patient has consented to  Procedure(s): ABDOMINAL AORTAGRAM (N/A) as a surgical intervention .  The patient's history has been reviewed, patient examined, no change in status, stable for surgery.  I have reviewed the patient's chart and labs.  Questions were answered to the patient's satisfaction.     [name]

## 2013-10-14 NOTE — CV Procedure (Addendum)
PERIPHERAL VASCULAR PROCEDURE  NAME:  Tracie Morris   MRN: [id number] DOB:  07-16-1956   ADMIT DATE: 10/14/2013  Performing Cardiologist: [name] Primary Physician: Duncan Dull, MD  Procedures Performed:  Abdominal Aortic Angiogram with Bi-Iliofemoral Runoff  Bilateral Lower Extremity Angiography (1st Order)  Left common iliac artery stent placement  Mynx closure device   Indication(s):   Claudication  Critical Limb Ischemia   Consent: The procedure with Risks/Benefits/Alternatives and Indications was reviewed with the patient .  All questions were answered.  Medications:  Sedation:  2 mg IV Versed, 150 mcg IV Fentanyl  Contrast:  169  Visipaque   Procedural details: The left groin was prepped, draped, and anesthetized with 1% lidocaine. Using modified Seldinger technique, a 5 French sheath was introduced into the left common with common femoral artery. A 5 Fr Short Pigtail Catheter was advanced of over a  Versicore wire into the descending Aorta to a level just above the renal arteries. A power injection of 6ml/sec contrast over 1 sec was performed for Abdominal Aortic Angiography.  The catheter was then pulled back to a level just above the Aortic bifurcation, and a second power injection was performed to evaluate the iliac arteries.   Bilateral lower extremity arterial run off was then performed via power injection of 7 ml / sec contrast for a total of 77 ml.      Interventional Procedure:   The patient was enrolled in the Endomax trial and was randomized either for heparin or bivalirudin. A 5 French sheath was exchanged into a 7 [place] Bright tip sheath. The study drug was given with therapeutic ACT. The lesion was crossed with a Versicore wire. The ostial lesion in the left common iliac artery was stented with an 8 x 18 mm Genesis balloon expandable stent to 8 Atm. The lesion extended into the distal aorta and thus I had to advance the stent 2 mm above the  bifurcation. I then advanced the balloon and postdilated the proximal segment into the distal aorta. There was 20% residual stenosis in the distal aorta. The only way to treat this would be by placing a self-expanding stent in the distal aorta or bilateral kissing stents in both common iliac arteries. I elected not to do that given that the right common iliac artery is free of disease.  I thus decided to accept the results. The sheath was then removed and the site was closed with a Mynx device.  The patient tolerated the procedure well with no immediate complications.   Hemodynamics:  Central Aortic Pressure / Mean Aortic Pressure: 147/74  Findings:  Abdominal aorta: Normal in size with minor irregularities and no evidence of aneurysm. There is to 50% stenosis in the distal aorta extending into the ostium left common iliac artery.  Left renal artery: Normal  Right renal artery: Normal  Celiac artery: Patent  Superior mesenteric artery: Patent  Right common iliac artery: Normal  Right internal iliac artery: Normal  Right external iliac artery: Normal  Right common femoral artery: Normal  Right profunda femoral artery: Normal  Right superficial femoral artery: Normal  Right popliteal artery: Normal  Right tibial peroneal trunk: Normal with 3 vessel run off.    Left common iliac artery:  60-70% ostial stenosis.   Left internal iliac artery: Normal.   Left external iliac artery: Normal  Left common femoral artery: Normal.   Left profunda femoral artery: Normal   Left superficial femoral artery:  Normal  Left popliteal artery:  Normal  Left tibial peroneal trunk: Normal with 3 vessel run off.   Conclusions: 1. 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 No significant infrainguinal disease. 3. Successful balloon expandable stent placement to the left common iliac artery.  Recommendations:  Continue dual antiplatelet  therapy for a minimum of 6 months due to the location of the stent.   [name], MD, [facility] 10/14/2013 10:18 AM

## 2013-10-14 NOTE — H&P (View-Only) (Signed)
 Primary care physician: Dr. Tullo.   HPI  This is a pleasant 57 year old Caucasian female who is here today for a followup visit.  She has no previous cardiac history but does have risk factors that include previous tobacco use, hyperlipidemia and established history of peripheral arterial disease. She started having left hip and back discomfort with walking in 2008. She was evaluated by Dr. Dew early this year. She underwent angiography which showed an occluded left common iliac artery. An angioplasty (7 mm balloon) without stent placement was performed via the right common femoral artery. There was residual 40-50% stenosis at the ostium of the left common iliac artery which could not be treated with a stent from the right femoral artery approach. The patient was already heparinized and thus access was not obtained via the left femoral artery. After the procedure, she reports partial improvement in her symptoms but not complete resolution. She continues to have lower back, left buttock and thigh discomfort with walking. Although this is not severe, it significantly affects her lifestyle given that she is very active.  Allergies  Allergen Reactions  . Darvocet [Propoxyphene-Acetaminophen]   . Demerol      Current Outpatient Prescriptions on File Prior to Visit  Medication Sig Dispense Refill  . aspirin 81 MG tablet Take 1 tablet (81 mg total) by mouth daily.  30 tablet    . butorphanol (STADOL) 10 MG/ML nasal spray Place 1 spray into the nose Once PRN for pain.  2.5 mL  2  . calcium carbonate (OS-CAL) 600 MG TABS Take 600 mg by mouth 2 (two) times daily with a meal.        . Cholecalciferol (VITAMIN D) 2000 UNITS tablet Take 2,000 Units by mouth daily.      . levothyroxine (SYNTHROID, LEVOTHROID) 150 MCG tablet Take 1 tablet (150 mcg total) by mouth daily.  90 tablet  3  . Multiple Vitamin (MULTIVITAMIN) tablet Take 1 tablet by mouth daily.        . rizatriptan (MAXALT-MLT) 10 MG  disintegrating tablet Take 1 tablet (10 mg total) by mouth as needed. May repeat in 2 hours if needed  10 tablet  3  . rosuvastatin (CRESTOR) 20 MG tablet Take 1 tablet (20 mg total) by mouth daily.  30 tablet  5  . zolpidem (AMBIEN) 10 MG tablet Take 1 tablet (10 mg total) by mouth at bedtime as needed for sleep.  30 tablet  3   No current facility-administered medications on file prior to visit.     Past Medical History  Diagnosis Date  . Hypocalcemia   . Migraine     improved since thyroid surgery, 2 years  . Thyroid cancer 2007    s/p thyroidectomy  . Hyperlipidemia   . hypothyroidism 2007    acquired, secondary to surgery for thyroid Ca  . Carotid artery occlusion   . 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 Thyroid surgery  2007    secondary to thyroid cancer  . Angioplasty illiac artery    . Cesarean section    . Nose surgery       Family History  Problem Relation Age of Onset  . Coronary artery disease Maternal Grandmother   . Heart disease Maternal Grandmother 70  . Coronary artery disease Paternal Grandmother 75  .   Heart disease Paternal Grandmother 75  . Alzheimer's disease Father   . Stroke Mother      History   Social History  . Marital Status: Married    Spouse Name: N/A    Number of Children: N/A  . Years of Education: N/A   Occupational History  . Not on file.   Social History Main Topics  . Smoking status: Former Smoker -- 1.00 packs/day for 20 years    Types: Cigarettes    Quit date: 10/28/2011  . Smokeless tobacco: Never Used  . Alcohol Use: No  . Drug Use: No  . Sexual Activity: Not on file   Other Topics Concern  . Not on file   Social History Narrative  . No narrative on file       PHYSICAL EXAM   BP 126/72  Pulse 80  Ht 5' 7" (1.702 m)  Wt 137 lb 4 oz (62.256  kg)  BMI 21.49 kg/m2 Constitutional: She is oriented to person, place, and time. She appears well-developed and well-nourished. No distress.  HENT: No nasal discharge.  Head: Normocephalic and atraumatic.  Eyes: Pupils are equal and round. Right eye exhibits no discharge. Left eye exhibits no discharge.  Neck: Normal range of motion. Neck supple. No JVD present. No thyromegaly present. No carotid bruits. Cardiovascular: Normal rate, regular rhythm, normal heart sounds. Exam reveals no gallop and no friction rub. No murmur heard.  Pulmonary/Chest: Effort normal and breath sounds normal. No stridor. No respiratory distress. She has no wheezes. She has no rales. She exhibits no tenderness.  Abdominal: Soft. Bowel sounds are normal. She exhibits no distension. There is no tenderness. There is no rebound and no guarding.  Musculoskeletal: Normal range of motion. She exhibits no edema and no tenderness.  Neurological: She is alert and oriented to person, place, and time. Coordination normal.  Skin: Skin is warm and dry. No rash noted. She is not diaphoretic. No erythema. No pallor.  Psychiatric: She has a normal mood and affect. Her behavior is normal. Judgment and thought content normal.  Vascular: Radial pulses are normal bilaterally. Femoral pulse: Normal on the right side and mildly diminished on the left side. Posterior tibial: Normal bilaterally. Dorsalis pedis: Normal on the right side and absent on the left side.      ASSESSMENT AND PLAN   

## 2013-10-27 ENCOUNTER — Encounter: Payer: Self-pay | Admitting: Cardiovascular Disease

## 2013-10-27 ENCOUNTER — Ambulatory Visit (INDEPENDENT_AMBULATORY_CARE_PROVIDER_SITE_OTHER): Payer: BC Managed Care – PPO | Admitting: Cardiovascular Disease

## 2013-10-27 ENCOUNTER — Encounter (INDEPENDENT_AMBULATORY_CARE_PROVIDER_SITE_OTHER): Payer: BC Managed Care – PPO

## 2013-10-27 VITALS — BP 142/89 | HR 104 | Ht 67.0 in | Wt 137.5 lb

## 2013-10-27 DIAGNOSIS — I739 Peripheral vascular disease, unspecified: Secondary | ICD-10-CM

## 2013-10-27 DIAGNOSIS — R079 Chest pain, unspecified: Secondary | ICD-10-CM

## 2013-10-27 DIAGNOSIS — E785 Hyperlipidemia, unspecified: Secondary | ICD-10-CM

## 2013-10-27 DIAGNOSIS — R0789 Other chest pain: Secondary | ICD-10-CM

## 2013-10-27 NOTE — Patient Instructions (Signed)
Your physician has requested that you have an echocardiogram. Echocardiography is a painless test that uses sound waves to create images of your heart. It provides your doctor with information about the size and shape of your heart and how well your heart's chambers and valves are working. This procedure takes approximately one hour. There are no restrictions for this procedure.  Schedule an ABI and aortoiliac duplex in 3 months.   Follow up in 3 months 1 week after the above tests.

## 2013-10-27 NOTE — Assessment & Plan Note (Signed)
Lab Results  Component Value Date   CHOL 161 01/05/2013   HDL 35.30* 01/05/2013   LDLCALC 104* 01/05/2013   TRIG 107.0 01/05/2013   CHOLHDL 5 01/05/2013   Continue treatment with rosuvastatin.

## 2013-10-27 NOTE — Progress Notes (Signed)
Primary care physician: Dr. Darrick Huntsman.   HPI  This is a pleasant 57 year old Caucasian female who is here today for a followup visit.  She has no previous cardiac history but does have risk factors that include previous tobacco use, hyperlipidemia and established history of peripheral arterial disease. She started having left hip and back discomfort with walking in 2008. She was evaluated by Dr. Wyn Quaker early this year. She underwent angiography which showed an occluded left common iliac artery. An angioplasty (7 mm balloon) without stent placement was performed via the right common femoral artery. There was residual 40-50% stenosis at the ostium of the left common iliac artery which could not be treated with a stent from the right femoral artery approach. The patient was already heparinized and thus access was not obtained via the left femoral artery. After the procedure, she reports partial improvement in her symptoms but not complete resolution. She continued to have significant claudication with evidence of borderline significant disease by duplex. I proceeded with angiography which showed 50% stenosis in the distal aorta extending into the ostium of the left common iliac artery which had 60-70% stenosis. I proceeded with a balloon expandable stent placement to the ostial left common iliac artery extending slightly into the distal aorta with very good results. She reports complete resolution of claudication. She continues to have intermittent episodes of substernal chest discomfort which is random and can happen at rest or with activities. She actually had one episode before her angiogram but she did not tell us about it. She had a small bruise under her left eye after she started taking Plavix. This is improving on its own.   Allergies  Allergen Reactions  . Darvocet [Propoxyphene-Acetaminophen] Nausea And Vomiting  . Demerol Nausea And Vomiting     Current Outpatient Prescriptions on File Prior to  Visit  Medication Sig Dispense Refill  . aspirin 81 MG tablet Take 1 tablet (81 mg total) by mouth daily.  30 tablet    . butorphanol (STADOL) 10 MG/ML nasal spray Place 1 spray into the nose Once PRN for pain.  2.5 mL  2  . calcium carbonate (OS-CAL) 600 MG TABS Take 600 mg by mouth 2 (two) times daily with a meal.        . Cholecalciferol (VITAMIN D) 2000 UNITS tablet Take 2,000 Units by mouth daily.      . clopidogrel (PLAVIX) 75 MG tablet Take 1 tablet (75 mg total) by mouth daily.  30 tablet  6  . levothyroxine (SYNTHROID, LEVOTHROID) 150 MCG tablet Take 1 tablet (150 mcg total) by mouth daily.  90 tablet  3  . Multiple Vitamin (MULTIVITAMIN) tablet Take 1 tablet by mouth daily.        . rizatriptan (MAXALT-MLT) 10 MG disintegrating tablet Take 1 tablet (10 mg total) by mouth as needed. May repeat in 2 hours if needed  10 tablet  3  . rosuvastatin (CRESTOR) 20 MG tablet Take 1 tablet (20 mg total) by mouth daily.  30 tablet  5  . zolpidem (AMBIEN) 10 MG tablet Take 1 tablet (10 mg total) by mouth at bedtime as needed for sleep.  30 tablet  3   No current facility-administered medications on file prior to visit.     Past Medical History  Diagnosis Date  . Hypocalcemia   . Migraine     improved since thyroid surgery, 2 years  . Thyroid cancer 2007    s/p thyroidectomy  . Hyperlipidemia   .  hypothyroidism 2007    acquired, secondary to surgery for thyroid Ca  . Carotid artery occlusion   . 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 Thyroid surgery  2007    secondary to thyroid cancer  . Angioplasty illiac artery    . Cesarean section    . Nose surgery       Family History  Problem Relation Age of Onset  . Coronary artery disease Maternal Grandmother   . Heart disease Maternal Grandmother 70  . Coronary artery  disease Paternal Grandmother 33  . Heart disease Paternal Grandmother 72  . Alzheimer's disease Father   . Stroke Mother      History   Social History  . Marital Status: Married    Spouse Name: N/A    Number of Children: N/A  . Years of Education: N/A   Occupational History  . Not on file.   Social History Main Topics  . Smoking status: Former Smoker -- 1.00 packs/day for 20 years    Types: Cigarettes    Quit date: 10/28/2011  . Smokeless tobacco: Never Used  . Alcohol Use: No  . Drug Use: No  . Sexual Activity: Not on file   Other Topics Concern  . Not on file   Social History Narrative  . No narrative on file       PHYSICAL EXAM   BP 142/89  Pulse 104  Ht 5\' 7"  (1.702 m)  Wt 137 lb 8 oz (62.37 kg)  BMI 21.53 kg/m2 Constitutional: She is oriented to person, place, and time. She appears well-developed and well-nourished. No distress.  HENT: No nasal discharge.  Head: Normocephalic and atraumatic.  Eyes: Pupils are equal and round. Right eye exhibits no discharge. Left eye exhibits no discharge.  Neck: Normal range of motion. Neck supple. No JVD present. No thyromegaly present. No carotid bruits. Cardiovascular: Normal rate, regular rhythm, normal heart sounds. Exam reveals no gallop and no friction rub. No murmur heard.  Pulmonary/Chest: Effort normal and breath sounds normal. No stridor. No respiratory distress. She has no wheezes. She has no rales. She exhibits no tenderness.  Abdominal: Soft. Bowel sounds are normal. She exhibits no distension. There is no tenderness. There is no rebound and no guarding.  Musculoskeletal: Normal range of motion. She exhibits no edema and no tenderness.  Neurological: She is alert and oriented to person, place, and time. Coordination normal.  Skin: Skin is warm and dry. No rash noted. She is not diaphoretic. No erythema. No pallor.  Psychiatric: She has a normal mood and affect. Her behavior is normal. Judgment and thought  content normal.  Vascular: Radial pulses are normal bilaterally. Femoral pulse: Normal bilaterally. Posterior tibial: Normal bilaterally. Dorsalis pedis: Normal bilaterally No groin hematoma or bruit   WUJ:[id number]  Tachycardia  -Right atrial enlargement.   -Prominent R(V1) and right axis -consider right ventricular hypertrophy.   ABNORMAL   ASSESSMENT AND PLAN

## 2013-10-27 NOTE — Assessment & Plan Note (Signed)
Treadmill stress test was normal. However, she is slightly tachycardic with possible right atrial and right ventricular enlargement. I think it's important to make sure she has no pulmonary hypertension. I will request an echocardiogram for evaluation.

## 2013-10-27 NOTE — Assessment & Plan Note (Signed)
She reports complete resolution of claudication after recent stent placement to the left common iliac artery. Due to the location of the stent and extending into the distal aorta, I recommend continuing Plavix for at least 3 months. ABI today was normal. I will plan on obtaining an aortoiliac duplex and ABI in 3 months. If there is no significant abnormalities on followup, I will plan on stopping Plavix at that time.

## 2013-11-03 ENCOUNTER — Other Ambulatory Visit: Payer: Self-pay

## 2013-11-03 ENCOUNTER — Other Ambulatory Visit (HOSPITAL_BASED_OUTPATIENT_CLINIC_OR_DEPARTMENT_OTHER): Payer: BC Managed Care – PPO

## 2013-11-03 DIAGNOSIS — R079 Chest pain, unspecified: Secondary | ICD-10-CM

## 2014-01-11 ENCOUNTER — Encounter: Payer: Self-pay | Admitting: Internal Medicine

## 2014-01-11 ENCOUNTER — Ambulatory Visit (INDEPENDENT_AMBULATORY_CARE_PROVIDER_SITE_OTHER): Payer: BC Managed Care – PPO | Admitting: Internal Medicine

## 2014-01-11 VITALS — BP 140/70 | HR 82 | Temp 98.5°F | Resp 16 | Wt 138.5 lb

## 2014-01-11 DIAGNOSIS — E079 Disorder of thyroid, unspecified: Secondary | ICD-10-CM

## 2014-01-11 DIAGNOSIS — Z9889 Other specified postprocedural states: Secondary | ICD-10-CM | POA: Insufficient documentation

## 2014-01-11 DIAGNOSIS — I10 Essential (primary) hypertension: Secondary | ICD-10-CM

## 2014-01-11 DIAGNOSIS — I739 Peripheral vascular disease, unspecified: Secondary | ICD-10-CM

## 2014-01-11 DIAGNOSIS — E785 Hyperlipidemia, unspecified: Secondary | ICD-10-CM

## 2014-01-11 DIAGNOSIS — Z931 Gastrostomy status: Secondary | ICD-10-CM

## 2014-01-11 DIAGNOSIS — Z23 Encounter for immunization: Secondary | ICD-10-CM

## 2014-01-11 DIAGNOSIS — Z79899 Other long term (current) drug therapy: Secondary | ICD-10-CM

## 2014-01-11 DIAGNOSIS — Z1211 Encounter for screening for malignant neoplasm of colon: Secondary | ICD-10-CM

## 2014-01-11 LAB — COMPREHENSIVE METABOLIC PANEL
ALBUMIN: 3.9 g/dL (ref 3.5–5.2)
ALT: 20 U/L (ref 0–35)
AST: 21 U/L (ref 0–37)
Alkaline Phosphatase: 105 U/L (ref 39–117)
BUN: 11 mg/dL (ref 6–23)
CALCIUM: 9.5 mg/dL (ref 8.4–10.5)
CHLORIDE: 106 meq/L (ref 96–112)
CO2: 26 mEq/L (ref 19–32)
Creatinine, Ser: 0.8 mg/dL (ref 0.4–1.2)
GFR: 80.47 mL/min (ref >60.00)
Glucose, Bld: 92 mg/dL (ref 70–99)
POTASSIUM: 4.1 meq/L (ref 3.5–5.1)
Sodium: 140 mEq/L (ref 135–145)
Total Bilirubin: 0.7 mg/dL (ref 0.3–1.2)
Total Protein: 7.6 g/dL (ref 6.0–8.3)

## 2014-01-11 LAB — TSH: TSH: 0.55 u[IU]/mL (ref 0.35–5.50)

## 2014-01-11 LAB — LIPID PANEL
CHOL/HDL RATIO: 4
Cholesterol: 161 mg/dL (ref 0–200)
HDL: 37 mg/dL — ABNORMAL LOW (ref >39.00)
LDL CALC: 101 mg/dL — AB (ref 0–99)
TRIGLYCERIDES: 117 mg/dL (ref 0.0–149.0)
VLDL: 23.4 mg/dL (ref 0.0–40.0)

## 2014-01-11 LAB — MICROALBUMIN / CREATININE URINE RATIO
CREATININE, U: 185.1 mg/dL
Microalb Creat Ratio: 0.3 mg/g (ref 0.0–30.0)
Microalb, Ur: 0.6 mg/dL (ref 0.0–1.9)

## 2014-01-11 MED ORDER — ROSUVASTATIN CALCIUM 20 MG PO TABS: 20.0000 mg | ORAL_TABLET | Freq: Every day | ORAL | Status: DC

## 2014-01-11 MED ORDER — ZOLPIDEM TARTRATE 10 MG PO TABS: 10.0000 mg | ORAL_TABLET | Freq: Every evening | ORAL | Status: DC | PRN

## 2014-01-11 MED ORDER — ZOSTER VACCINE LIVE 19400 UNT/0.65ML ~~LOC~~ SOLR: 0.6500 mL | Freq: Once | SUBCUTANEOUS | Status: DC

## 2014-01-11 MED ORDER — RIZATRIPTAN BENZOATE 10 MG PO TBDP: 10.0000 mg | ORAL_TABLET | ORAL | Status: DC | PRN

## 2014-01-11 MED ORDER — BUTORPHANOL TARTRATE 10 MG/ML NA SOLN: 1.0000 | Freq: Once | NASAL | Status: DC | PRN

## 2014-01-11 NOTE — Progress Notes (Signed)
Patient ID: Tracie Morris, female   DOB: 03-19-1956, 58 y.o.   MRN: [id number]  Patient Active Problem List   Diagnosis Date Noted  . S/P Nissen fundoplication (with gastrostomy tube placement) 01/11/2014  . Atypical chest pain 07/28/2013  . Peripheral arterial disease   . Post menopausal syndrome 07/07/2013  . Peripheral vascular disease with pain at rest 05/16/2012  . Screening for cervical cancer 10/20/2011  . Screening for breast cancer 10/20/2011  . Screening for colon cancer 10/20/2011  . Thyroid cancer   . hypothyroidism   . Hyperlipidemia   . Migraine     Subjective:  CC:   Chief Complaint  Patient presents with  . Follow-up    6 month    HPI:   Tracie Morris is a 58 y.o. female who presents for follow upon chronic conditions including PAD, hyperlipidemia, hypothyroidisim.She has had several procedures since her last OV including a stress test (normal, Sept 2014) and PTCA with successful stent placement by Dr Fletcher Anon in Nov 2014  into the ostium of L common iliac artery extending into the distal aorta for 50% stenosis of distal aorta and 70% stenosis of common iliac with claudication .   Tolerating Plavix, aspirin and crestor,  Not smoking.  However she has developed interim claudication symptoms in the left thigh again over the last several weeks, although milder and less frequent than in the past.  Sees cardiology next month.    Has noted elevated pulse chronically and her bp is elevated today as well.   Past Medical History  Diagnosis Date  . Hypocalcemia   . Migraine     improved since thyroid surgery, 2 years  . Thyroid cancer 2007    s/p thyroidectomy  . Hyperlipidemia   . hypothyroidism 2007    acquired, secondary to surgery for thyroid Ca  . Carotid artery occlusion   . 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 Repeat angiography in November of 2014 showed  50% distal aortic stenosis extending into the left common iliac artery which had 60-70% ostial stenosis. No other significant disease. I placed on balloon expandable stent into  left common iliac artery extending slightly into the distal aorta    Past Surgical History  Procedure Laterality Date  . Thyroid surgery  2007    secondary to thyroid cancer  . Angioplasty illiac artery    . Cesarean section    . Nose surgery         The following portions of the patient's history were reviewed and updated as appropriate: Allergies, current medications, and problem list.    Review of Systems:   Patient denies headache, fevers, malaise, unintentional weight loss, skin rash, eye pain, sinus congestion and sinus pain, sore throat, dysphagia,  hemoptysis , cough, dyspnea, wheezing, chest pain, palpitations, orthopnea, edema, abdominal pain, nausea, melena, diarrhea, constipation, flank pain, dysuria, hematuria, urinary  Frequency, nocturia, numbness, tingling, seizures,  Focal weakness, Loss of consciousness,  Tremor, insomnia, depression, anxiety, and suicidal ideation.     History   Social History  . Marital Status: Married    Spouse Name: N/A    Number of Children: N/A  . Years of Education: N/A   Occupational History  . Not on file.   Social History Main Topics  . Smoking status: Former Smoker -- 1.00 packs/day for 20 years    Types: Cigarettes    Quit date:  10/28/2011  . Smokeless tobacco: Never Used  . Alcohol Use: No  . Drug Use: No  . Sexual Activity: Not on file   Other Topics Concern  . Not on file   Social History Narrative  . No narrative on file    Objective:  Filed Vitals:   01/11/14 0805  BP: 140/70  Pulse: 82  Temp: 98.5 F (36.9 C)  Resp: 16     General appearance: alert, cooperative and appears stated age Ears: normal TM's and external ear canals both ears Throat: lips, mucosa, and tongue normal; teeth and gums normal Neck: no adenopathy, no  carotid bruit, supple, symmetrical, trachea midline and thyroid not enlarged, symmetric, no tenderness/mass/nodules Back: symmetric, no curvature. ROM normal. No CVA tenderness. Lungs: clear to auscultation bilaterally Heart: regular rate and rhythm, S1, S2 normal, no murmur, click, rub or gallop Abdomen: soft, non-tender; bowel sounds normal; no masses,  no organomegaly Pulses: feeble DP left foot, cap refill sluggish, foot warm Skin: Skin color, texture, turgor normal. No rashes or lesions Lymph nodes: Cervical, supraclavicular, and axillary nodes normal.  Assessment and Plan:  Peripheral arterial disease Symptoms of claudication have returned on the left side with reports of left lower back and thigh discomfort, mild.  Pulse is feeble and cap refill is sluggish, but foot is warm. She has an appt with dr Fletcher Anon next month for repeat ABIs   Screening for colon cancer She continues to defer screening colonoscopy due to prior adverse GI events resulting from a procedure which may have been a barium swallow, which caused nausea and vomiting.  Will resume annual FOBTs, last one 2013.   hypothyroidism Thyroid function is WNL on current dose.  No current changes needed.  Lab Results  Component Value Date   TSH 0.55 01/11/2014     Hyperlipidemia She is tolerating crestor without side effects. LFTs normal,  Low HDL discussed with regard to diet.   Lab Results  Component Value Date   CHOL 161 01/11/2014   HDL 37.00* 01/11/2014   LDLCALC 101* 01/11/2014   TRIG 117.0 01/11/2014   CHOLHDL 4 01/11/2014   Lab Results  Component Value Date   ALT 20 01/11/2014   AST 21 01/11/2014   ALKPHOS 105 01/11/2014   BILITOT 0.7 01/11/2014      Updated Medication List Outpatient Encounter Prescriptions as of 01/11/2014  Medication Sig  . aspirin 81 MG tablet Take 1 tablet (81 mg total) by mouth daily.  . butorphanol (STADOL) 10 MG/ML nasal spray Place 1 spray into the nose Once PRN.  . calcium  carbonate (OS-CAL) 600 MG TABS Take 600 mg by mouth 2 (two) times daily with a meal.    . Cholecalciferol (VITAMIN D) 2000 UNITS tablet Take 2,000 Units by mouth daily.  . clopidogrel (PLAVIX) 75 MG tablet Take 1 tablet (75 mg total) by mouth daily.  Marland Kitchen levothyroxine (SYNTHROID, LEVOTHROID) 150 MCG tablet Take 1 tablet (150 mcg total) by mouth daily.  . Multiple Vitamin (MULTIVITAMIN) tablet Take 1 tablet by mouth daily.    . rizatriptan (MAXALT-MLT) 10 MG disintegrating tablet Take 1 tablet (10 mg total) by mouth as needed. May repeat in 2 hours if needed  . rosuvastatin (CRESTOR) 20 MG tablet Take 1 tablet (20 mg total) by mouth daily.  Marland Kitchen zolpidem (AMBIEN) 10 MG tablet Take 1 tablet (10 mg total) by mouth at bedtime as needed for sleep.  . [DISCONTINUED] butorphanol (STADOL) 10 MG/ML nasal spray Place 1 spray into the nose  Once PRN for pain.  . [DISCONTINUED] rizatriptan (MAXALT-MLT) 10 MG disintegrating tablet Take 1 tablet (10 mg total) by mouth as needed. May repeat in 2 hours if needed  . [DISCONTINUED] rosuvastatin (CRESTOR) 20 MG tablet Take 1 tablet (20 mg total) by mouth daily.  . [DISCONTINUED] zolpidem (AMBIEN) 10 MG tablet Take 1 tablet (10 mg total) by mouth at bedtime as needed for sleep.  Marland Kitchen zoster vaccine live, PF, (ZOSTAVAX) 38756 UNT/0.65ML injection Inject 19,400 Units into the skin once.     Orders Placed This Encounter  Procedures  . Fecal occult blood, imunochemical  . HM DEXA SCAN  . HM MAMMOGRAPHY  . Tdap vaccine greater than or equal to 7yo IM  . HM PAP SMEAR  . HM PAP SMEAR  . Comprehensive metabolic panel  . TSH  . Lipid panel  . Microalbumin / creatinine urine ratio    Return in about 6 months (around 07/11/2014).

## 2014-01-11 NOTE — Progress Notes (Signed)
Pre-visit discussion using our clinic review tool. No additional management support is needed unless otherwise documented below in the visit note.  

## 2014-01-11 NOTE — Patient Instructions (Signed)
Please check your bp and pulse at home 5 times over the next 2 weeks and let me know the readings  (normal is < 130/80 and pulse < 90)  TDaP vaccine given today  Shingles vaccine due  Fecal occult blood test due

## 2014-01-12 ENCOUNTER — Encounter: Payer: Self-pay | Admitting: Internal Medicine

## 2014-01-12 NOTE — Assessment & Plan Note (Signed)
Symptoms of claudication have returned on the left side with reports of left lower back and thigh discomfort, mild.  Pulse is feeble and cap refill is sluggish, but foot is warm. She has an appt with dr Fletcher Anon next month for repeat ABIs

## 2014-01-12 NOTE — Assessment & Plan Note (Signed)
She is tolerating crestor without side effects. LFTs normal,  Low HDL discussed with regard to diet.   Lab Results  Component Value Date   CHOL 161 01/11/2014   HDL 37.00* 01/11/2014   LDLCALC 101* 01/11/2014   TRIG 117.0 01/11/2014   CHOLHDL 4 01/11/2014   Lab Results  Component Value Date   ALT 20 01/11/2014   AST 21 01/11/2014   ALKPHOS 105 01/11/2014   BILITOT 0.7 01/11/2014

## 2014-01-12 NOTE — Assessment & Plan Note (Signed)
She continues to defer screening colonoscopy due to prior adverse GI events resulting from a procedure which may have been a barium swallow, which caused nausea and vomiting.  Will resume annual FOBTs, last one 2013.

## 2014-01-12 NOTE — Assessment & Plan Note (Signed)
Thyroid function is WNL on current dose.  No current changes needed.  Lab Results  Component Value Date   TSH 0.55 01/11/2014

## 2014-01-13 ENCOUNTER — Telehealth: Payer: Self-pay | Admitting: Internal Medicine

## 2014-01-13 NOTE — Telephone Encounter (Signed)
Relevant patient education assigned to patient using Emmi. ° °

## 2014-01-14 NOTE — Telephone Encounter (Signed)
Mailed unread MyChart message

## 2014-02-02 ENCOUNTER — Encounter (INDEPENDENT_AMBULATORY_CARE_PROVIDER_SITE_OTHER): Payer: BC Managed Care – PPO

## 2014-02-02 DIAGNOSIS — I739 Peripheral vascular disease, unspecified: Secondary | ICD-10-CM

## 2014-02-02 DIAGNOSIS — I700 Atherosclerosis of aorta: Secondary | ICD-10-CM

## 2014-02-15 ENCOUNTER — Ambulatory Visit: Payer: BC Managed Care – PPO | Admitting: Cardiovascular Disease

## 2014-02-23 ENCOUNTER — Other Ambulatory Visit (INDEPENDENT_AMBULATORY_CARE_PROVIDER_SITE_OTHER): Payer: BC Managed Care – PPO

## 2014-02-23 DIAGNOSIS — Z1211 Encounter for screening for malignant neoplasm of colon: Secondary | ICD-10-CM

## 2014-02-23 LAB — FECAL OCCULT BLOOD, IMMUNOCHEMICAL: Fecal Occult Bld: NEGATIVE

## 2014-02-25 ENCOUNTER — Encounter: Payer: Self-pay | Admitting: Internal Medicine

## 2014-03-01 ENCOUNTER — Encounter: Payer: Self-pay | Admitting: Cardiovascular Disease

## 2014-03-01 ENCOUNTER — Ambulatory Visit (INDEPENDENT_AMBULATORY_CARE_PROVIDER_SITE_OTHER): Payer: BC Managed Care – PPO | Admitting: Cardiovascular Disease

## 2014-03-01 VITALS — BP 149/80 | HR 86 | Ht 67.0 in | Wt 137.0 lb

## 2014-03-01 DIAGNOSIS — I739 Peripheral vascular disease, unspecified: Secondary | ICD-10-CM

## 2014-03-01 DIAGNOSIS — E785 Hyperlipidemia, unspecified: Secondary | ICD-10-CM

## 2014-03-01 NOTE — Assessment & Plan Note (Signed)
She takes Crestor 10 mg every other day. Lab Results  Component Value Date   CHOL 161 01/11/2014   HDL 37.00* 01/11/2014   LDLCALC 101* 01/11/2014   TRIG 117.0 01/11/2014   CHOLHDL 4 01/11/2014   We should consider changing the dose to 10 mg once daily in order to achieve an LDL less than 70.

## 2014-03-01 NOTE — Assessment & Plan Note (Signed)
She is doing well with no recurrent claudication. She does have mild neuropathic pain which has not been exertional. Femoral pulses are normal. ABI done last month was also normal. Duplex showed patent stent with no significant restenosis. Continue medical therapy. We can now stop Plavix. I recommend a followup aortoiliac duplex and ABI in September.

## 2014-03-01 NOTE — Progress Notes (Signed)
Primary care physician: Dr. Derrel Nip.   HPI  This is a pleasant 58 year old Caucasian female who is here today for a followup visit regarding peripheral arterial disease.  She is status post left common iliac artery stent placement in November of 2014 for significant claudication. Other Medical history include previous tobacco use and hyperlipidemia  She has been doing very well and denies any recurrent claudication she does have occasional left hip discomfort mostly at rest and not with physical activities.  Allergies  Allergen Reactions  . Darvocet [Propoxyphene N-Acetaminophen] Nausea And Vomiting  . Demerol Nausea And Vomiting     Current Outpatient Prescriptions on File Prior to Visit  Medication Sig Dispense Refill  . aspirin 81 MG tablet Take 1 tablet (81 mg total) by mouth daily.  30 tablet    . butorphanol (STADOL) 10 MG/ML nasal spray Place 1 spray into the nose Once PRN.  2.5 mL  2  . calcium carbonate (OS-CAL) 600 MG TABS Take 600 mg by mouth 2 (two) times daily with a meal.        . Cholecalciferol (VITAMIN D) 2000 UNITS tablet Take 2,000 Units by mouth daily.      . clopidogrel (PLAVIX) 75 MG tablet Take 1 tablet (75 mg total) by mouth daily.  30 tablet  6  . levothyroxine (SYNTHROID, LEVOTHROID) 150 MCG tablet Take 1 tablet (150 mcg total) by mouth daily.  90 tablet  3  . Multiple Vitamin (MULTIVITAMIN) tablet Take 1 tablet by mouth daily.        . rizatriptan (MAXALT-MLT) 10 MG disintegrating tablet Take 1 tablet (10 mg total) by mouth as needed. May repeat in 2 hours if needed  10 tablet  3  . rosuvastatin (CRESTOR) 20 MG tablet Take 1 tablet (20 mg total) by mouth daily.  30 tablet  5  . zolpidem (AMBIEN) 10 MG tablet Take 1 tablet (10 mg total) by mouth at bedtime as needed for sleep.  30 tablet  3  . zoster vaccine live, PF, (ZOSTAVAX) 29937 UNT/0.65ML injection Inject 19,400 Units into the skin once.  1 each  0   No current facility-administered medications on file  prior to visit.     Past Medical History  Diagnosis Date  . Hypocalcemia   . Migraine     improved since thyroid surgery, 2 years  . Thyroid cancer 2007    s/p thyroidectomy  . Hyperlipidemia   . hypothyroidism 2007    acquired, secondary to surgery for thyroid Ca  . Carotid artery occlusion   . 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 Repeat angiography in November of 2014 showed 50% distal aortic stenosis extending into the left common iliac artery which had 60-70% ostial stenosis. No other significant disease. I placed on balloon expandable stent into  left common iliac artery extending slightly into the distal aorta     Past Surgical History  Procedure Laterality Date  . Thyroid surgery  2007    secondary to thyroid cancer  . Angioplasty illiac artery    . Cesarean section    . Nose surgery       Family History  Problem Relation Age of Onset  . Coronary artery disease Maternal Grandmother   . Heart disease Maternal Grandmother 70  . Coronary artery disease Paternal Grandmother 33  . Heart disease Paternal Grandmother 39  . Alzheimer's disease Father   .  Stroke Mother      History   Social History  . Marital Status: Married    Spouse Name: N/A    Number of Children: N/A  . Years of Education: N/A   Occupational History  . Not on file.   Social History Main Topics  . Smoking status: Former Smoker -- 1.00 packs/day for 20 years    Types: Cigarettes    Quit date: 10/28/2011  . Smokeless tobacco: Never Used  . Alcohol Use: No  . Drug Use: No  . Sexual Activity: Not on file   Other Topics Concern  . Not on file   Social History Narrative  . No narrative on file       PHYSICAL EXAM   BP 149/80  Pulse 86  Ht 5\' 7"  (1.702 m)  Wt 137 lb (62.143 kg)  BMI 21.45 kg/m2 Constitutional: She is oriented to person, place, and time. She appears  well-developed and well-nourished. No distress.  HENT: No nasal discharge.  Head: Normocephalic and atraumatic.  Eyes: Pupils are equal and round. Right eye exhibits no discharge. Left eye exhibits no discharge.  Neck: Normal range of motion. Neck supple. No JVD present. No thyromegaly present. No carotid bruits. Cardiovascular: Normal rate, regular rhythm, normal heart sounds. Exam reveals no gallop and no friction rub. No murmur heard.  Pulmonary/Chest: Effort normal and breath sounds normal. No stridor. No respiratory distress. She has no wheezes. She has no rales. She exhibits no tenderness.  Abdominal: Soft. Bowel sounds are normal. She exhibits no distension. There is no tenderness. There is no rebound and no guarding.  Musculoskeletal: Normal range of motion. She exhibits no edema and no tenderness.  Neurological: She is alert and oriented to person, place, and time. Coordination normal.  Skin: Skin is warm and dry. No rash noted. She is not diaphoretic. No erythema. No pallor.  Psychiatric: She has a normal mood and affect. Her behavior is normal. Judgment and thought content normal.  Vascular: Radial pulses are normal bilaterally. Femoral pulse: Normal bilaterally. Posterior tibial: Normal bilaterally. Dorsalis pedis: Normal on the right side and mildly diminished on the left side    ASSESSMENT AND PLAN

## 2014-03-01 NOTE — Patient Instructions (Signed)
  Your physician has recommended you make the following change in your medication:  1. Stop Plavix   Your physician recommends that you schedule a follow-up appointment same day as test with Dr. Fletcher Anon   Your physician has requested that you have an Aortic Iliac Duplex in September  Your physician has requested that you have an ankle brachial index (ABI) in September. During this test an ultrasound and blood pressure cuff are used to evaluate the arteries that supply the arms and legs with blood. Allow thirty minutes for this exam. There are no restrictions or special instructions.

## 2014-03-29 DIAGNOSIS — C4491 Basal cell carcinoma of skin, unspecified: Secondary | ICD-10-CM

## 2014-03-29 HISTORY — DX: Basal cell carcinoma of skin, unspecified: C44.91

## 2014-04-12 LAB — FECAL OCCULT BLOOD, GUAIAC: FECAL OCCULT BLD: NEGATIVE

## 2014-07-12 ENCOUNTER — Ambulatory Visit (INDEPENDENT_AMBULATORY_CARE_PROVIDER_SITE_OTHER): Payer: BC Managed Care – PPO | Admitting: Internal Medicine

## 2014-07-12 ENCOUNTER — Encounter: Payer: Self-pay | Admitting: Internal Medicine

## 2014-07-12 VITALS — BP 120/68 | HR 85 | Temp 98.7°F | Resp 16 | Ht 67.0 in | Wt 134.8 lb

## 2014-07-12 DIAGNOSIS — I739 Peripheral vascular disease, unspecified: Secondary | ICD-10-CM

## 2014-07-12 DIAGNOSIS — Z1239 Encounter for other screening for malignant neoplasm of breast: Secondary | ICD-10-CM

## 2014-07-12 DIAGNOSIS — Z79899 Other long term (current) drug therapy: Secondary | ICD-10-CM

## 2014-07-12 DIAGNOSIS — Z0000 Encounter for general adult medical examination without abnormal findings: Secondary | ICD-10-CM

## 2014-07-12 DIAGNOSIS — R0789 Other chest pain: Secondary | ICD-10-CM

## 2014-07-12 DIAGNOSIS — J309 Allergic rhinitis, unspecified: Secondary | ICD-10-CM

## 2014-07-12 DIAGNOSIS — E079 Disorder of thyroid, unspecified: Secondary | ICD-10-CM

## 2014-07-12 LAB — COMPREHENSIVE METABOLIC PANEL
ALK PHOS: 117 U/L (ref 39–117)
ALT: 12 U/L (ref 0–35)
AST: 20 U/L (ref 0–37)
Albumin: 4 g/dL (ref 3.5–5.2)
BUN: 13 mg/dL (ref 6–23)
CO2: 27 mEq/L (ref 19–32)
Calcium: 9.5 mg/dL (ref 8.4–10.5)
Chloride: 103 mEq/L (ref 96–112)
Creatinine, Ser: 0.9 mg/dL (ref 0.4–1.2)
GFR: 66.4 mL/min (ref >60.00)
Glucose, Bld: 97 mg/dL (ref 70–99)
POTASSIUM: 4.5 meq/L (ref 3.5–5.1)
Sodium: 140 mEq/L (ref 135–145)
Total Bilirubin: 0.6 mg/dL (ref 0.2–1.2)
Total Protein: 7.6 g/dL (ref 6.0–8.3)

## 2014-07-12 MED ORDER — ZOLPIDEM TARTRATE 10 MG PO TABS: 10.0000 mg | ORAL_TABLET | Freq: Every evening | ORAL | Status: DC | PRN

## 2014-07-12 MED ORDER — ZOSTER VACCINE LIVE 19400 UNT/0.65ML ~~LOC~~ SOLR
0.6500 mL | Freq: Once | SUBCUTANEOUS | Status: DC
Start: 2014-07-12 — End: 2016-02-16

## 2014-07-12 MED ORDER — BENZONATATE 200 MG PO CAPS: 200.0000 mg | ORAL_CAPSULE | Freq: Three times a day (TID) | ORAL | Status: DC | PRN

## 2014-07-12 NOTE — Progress Notes (Signed)
Patient ID: Tracie Morris, female   DOB: 08-18-1956, 58 y.o.   MRN: [id number]   Subjective:     Tracie Morris is a 58 y.o. female and is here for a comprehensive physical exam. The patient reports problems as follows:.  1) 3 week history of sinus drainage following treatment for bacterial sinusitis.  Wasa treated by Urgent Care with a Z pack and singulair. After several suspensions she determined that the Singulair gave her diarrhea, so she stopped taking it.  Continues to have hoarseness, persistent  Cough.  She states that she is NOT smoking, but husband continues to smoke which makes abstinence more challenging.     2) MIGRAINE headaches.  She has a history of basilar artery migraines, with double vision or amaurosis fugax  Lasting 40 minutes ,  Usually occurring in the left eye. She was treated by Dr. Tobe Sos for years.  Hasn't had any in years, until she stopped the post angioplasty/stent  Plavix (as recommended by Dr.  Fletcher Anon at follow up ) .  Headaches are accompanied by  Her prior aura andwould last for weeks at a time. She tried taking daily aspriirn but developed symptoms of gastritis. She is now Using plavix one tablet every 5 days to prevent migraine.        History   Social History  . Marital Status: Married    Spouse Name: N/A    Number of Children: N/A  . Years of Education: N/A   Occupational History  . Not on file.   Social History Main Topics  . Smoking status: Former Smoker -- 1.00 packs/day for 20 years    Types: Cigarettes    Quit date: 10/28/2011  . Smokeless tobacco: Never Used  . Alcohol Use: No  . Drug Use: No  . Sexual Activity: Not on file   Other Topics Concern  . Not on file   Social History Narrative  . No narrative on file   Health Maintenance  Topic Date Due  . Colonoscopy  06/29/2005  . Influenza Vaccine  06/26/2014  . Mammogram  09/15/2015  . Pap Smear  04/10/2016  . Tetanus/tdap  01/12/2024    The following portions of the patient's  history were reviewed and updated as appropriate: allergies, current medications, past family history, past medical history, past social history, past surgical history and problem list.  Review of Systems A comprehensive review of systems was negative.   Objective:   BP 120/68  Pulse 85  Temp(Src) 98.7 F (37.1 C) (Oral)  Resp 16  Ht 5\' 7"  (1.702 m)  Wt 134 lb 12 oz (61.122 kg)  BMI 21.10 kg/m2  SpO2 97% General appearance: alert, cooperative and appears stated age Head: Normocephalic, without obvious abnormality, atraumatic Eyes: conjunctivae/corneas clear. PERRL, EOM's intact. Fundi benign. Ears: normal TM's and external ear canals both ears Nose: Nares normal. Septum midline. Mucosa normal. No drainage or sinus tenderness. Throat: lips, mucosa, and tongue normal; teeth and gums normal Neck: no adenopathy, no carotid bruit, no JVD, supple, symmetrical, trachea midline and thyroid not enlarged, symmetric, no tenderness/mass/nodules Lungs: clear to auscultation bilaterally Breasts: normal appearance, no masses or tenderness Heart: regular rate and rhythm, S1, S2 normal, no murmur, click, rub or gallop Abdomen: soft, non-tender; bowel sounds normal; no masses,  no organomegaly Extremities: extremities normal, atraumatic, no cyanosis or edema Pulses: 2+ in right foot, trace in left foot  Skin: Skin color, texture, turgor normal. No rashes or lesions Neurologic: Alert and oriented X  3, normal strength and tone. Normal symmetric reflexes. Normal coordination and gait.   .    Assessment and Plan   :   Peripheral arterial disease Recent recurrence of migraines relieved with resuming Plavix raises concern for cerebral atherosclerosis . Will consider MRA if symptoms recur ON plavix. Continue statin, plavix and baby asa if tolerated.   Peripheral vascular disease with pain at rest S/p PTCA with balloon angioplasty of left  common iliac aretery  On jan 14th by [facility]otis Pain,  Repeat  ultrasound planned by Dr. Fletcher Anon. Pulse is weak in left DP but foot is well perfused and cap refill is < 2 sec . She remains tobacco abstinent but does smell of tobacco today   Atypical chest pain Stress test and ECHO were normal. Symptoms resolved, has history of  hiatal hernia.   Encounter for preventive health examination Annual wellness  exam was done as well as a comprehensive physical exam and management of acute and chronic conditions .  During the course of the visit the patient was educated and counseled about appropriate screening and preventive services including : fall prevention , diabetes screening, nutrition counseling, colorectal cancer screening, and recommended immunizations.  Printed recommendations for health maintenance screenings was given.   Rhinitis Recommended trial of OTC antihistamines and saline flushes.  Delsym for PND induced cough vs tessalon .   hypothyroidism Iatrogenic secondary to total thyroidectomy for CA.  Repeat TSh is pending and refills will be done after evaluation of today's TSH   Lab Results  Component Value Date   TSH 0.55 01/11/2014      Updated Medication List Outpatient Encounter Prescriptions as of 07/12/2014  Medication Sig  . aspirin 81 MG tablet Take 1 tablet (81 mg total) by mouth daily.  . butorphanol (STADOL) 10 MG/ML nasal spray Place 1 spray into the nose Once PRN.  . calcium carbonate (OS-CAL) 600 MG TABS Take 600 mg by mouth 2 (two) times daily with a meal.    . Cholecalciferol (VITAMIN D) 2000 UNITS tablet Take 2,000 Units by mouth daily.  . clopidogrel (PLAVIX) 75 MG tablet Take 75 mg by mouth once. ONE EVERY 5 DAYS  . levothyroxine (SYNTHROID, LEVOTHROID) 150 MCG tablet Take 1 tablet (150 mcg total) by mouth daily.  . Multiple Vitamin (MULTIVITAMIN) tablet Take 1 tablet by mouth daily.    . rizatriptan (MAXALT-MLT) 10 MG disintegrating tablet Take 1 tablet (10 mg total) by mouth as needed. May repeat in 2 hours if needed  .  rosuvastatin (CRESTOR) 20 MG tablet Take 1 tablet (20 mg total) by mouth daily.  Marland Kitchen zolpidem (AMBIEN) 10 MG tablet Take 1 tablet (10 mg total) by mouth at bedtime as needed for sleep.  . [DISCONTINUED] clopidogrel (PLAVIX) 75 MG tablet Take 1 tablet (75 mg total) by mouth daily.  . [DISCONTINUED] zolpidem (AMBIEN) 10 MG tablet Take 1 tablet (10 mg total) by mouth at bedtime as needed for sleep.  . benzonatate (TESSALON) 200 MG capsule Take 1 capsule (200 mg total) by mouth 3 (three) times daily as needed for cough.  . zoster vaccine live, PF, (ZOSTAVAX) 37902 UNT/0.65ML injection Inject 19,400 Units into the skin once.  . zoster vaccine live, PF, (ZOSTAVAX) 40973 UNT/0.65ML injection Inject 19,400 Units into the skin once.

## 2014-07-12 NOTE — Patient Instructions (Addendum)
For rhinitis You can try either Zyrtec 10 mg ( cetirizine) or allegra (fexofenadine  60,  120 or 180 mg daily )  You can use delsym available OTC or the benzonatate capsules I have prescribed to suppress the cough  We can always add a nasal spry if the drip does not improve using the antihistamine  I agree with continuing the Plavix every 5 days  To prevent your migraines   I will refill your thyroid medication and other meds after I see your labs from today   Health Maintenance Adopting a healthy lifestyle and getting preventive care can go a long way to promote health and wellness. Talk with your health care provider about what schedule of regular examinations is right for you. This is a good chance for you to check in with your provider about disease prevention and staying healthy. In between checkups, there are plenty of things you can do on your own. Experts have done a lot of research about which lifestyle changes and preventive measures are most likely to keep you healthy. Ask your health care provider for more information. WEIGHT AND DIET  Eat a healthy diet  Be sure to include plenty of vegetables, fruits, low-fat dairy products, and lean protein.  Do not eat a lot of foods high in solid fats, added sugars, or salt.  Get regular exercise. This is one of the most important things you can do for your health.  Most adults should exercise for at least 150 minutes each week. The exercise should increase your heart rate and make you sweat (moderate-intensity exercise).  Most adults should also do strengthening exercises at least twice a week. This is in addition to the moderate-intensity exercise.  Maintain a healthy weight  Body mass index (BMI) is a measurement that can be used to identify possible weight problems. It estimates body fat based on height and weight. Your health care provider can help determine your BMI and help you achieve or maintain a healthy weight.  For females  77 years of age and older:   A BMI below 18.5 is considered underweight.  A BMI of 18.5 to 24.9 is normal.  A BMI of 25 to 29.9 is considered overweight.  A BMI of 30 and above is considered obese.  Watch levels of cholesterol and blood lipids  You should start having your blood tested for lipids and cholesterol at 59 years of age, then have this test every 5 years.  You may need to have your cholesterol levels checked more often if:  Your lipid or cholesterol levels are high.  You are older than 59 years of age.  You are at high risk for heart disease.  CANCER SCREENING   Lung Cancer  Lung cancer screening is recommended for adults 71-10 years old who are at high risk for lung cancer because of a history of smoking.  A yearly low-dose CT scan of the lungs is recommended for people who:  Currently smoke.  Have quit within the past 15 years.  Have at least a 30-pack-year history of smoking. A pack year is smoking an average of one pack of cigarettes a day for 1 year.  Yearly screening should continue until it has been 15 years since you quit.  Yearly screening should stop if you develop a health problem that would prevent you from having lung cancer treatment.  Breast Cancer  Practice breast self-awareness. This means understanding how your breasts normally appear and feel.  It also means  doing regular breast self-exams. Let your health care provider know about any changes, no matter how small.  If you are in your 20s or 30s, you should have a clinical breast exam (CBE) by a health care provider every 1-3 years as part of a regular health exam.  If you are 59 or older, have a CBE every year. Also consider having a breast X-ray (mammogram) every year.  If you have a family history of breast cancer, talk to your health care provider about genetic screening.  If you are at high risk for breast cancer, talk to your health care provider about having an MRI and a  mammogram every year.  Breast cancer gene (BRCA) assessment is recommended for women who have family members with BRCA-related cancers. BRCA-related cancers include:  Breast.  Ovarian.  Tubal.  Peritoneal cancers.  Results of the assessment will determine the need for genetic counseling and BRCA1 and BRCA2 testing. Cervical Cancer Routine pelvic examinations to screen for cervical cancer are no longer recommended for nonpregnant women who are considered low risk for cancer of the pelvic organs (ovaries, uterus, and vagina) and who do not have symptoms. A pelvic examination may be necessary if you have symptoms including those associated with pelvic infections. Ask your health care provider if a screening pelvic exam is right for you.   The Pap test is the screening test for cervical cancer for women who are considered at risk.  If you had a hysterectomy for a problem that was not cancer or a condition that could lead to cancer, then you no longer need Pap tests.  If you are older than 65 years, and you have had normal Pap tests for the past 10 years, you no longer need to have Pap tests.  If you have had past treatment for cervical cancer or a condition that could lead to cancer, you need Pap tests and screening for cancer for at least 20 years after your treatment.  If you no longer get a Pap test, assess your risk factors if they change (such as having a new sexual partner). This can affect whether you should start being screened again.  Some women have medical problems that increase their chance of getting cervical cancer. If this is the case for you, your health care provider may recommend more frequent screening and Pap tests.  The human papillomavirus (HPV) test is another test that may be used for cervical cancer screening. The HPV test looks for the virus that can cause cell changes in the cervix. The cells collected during the Pap test can be tested for HPV.  The HPV test can  be used to screen women 70 years of age and older. Getting tested for HPV can extend the interval between normal Pap tests from three to five years.  An HPV test also should be used to screen women of any age who have unclear Pap test results.  After 59 years of age, women should have HPV testing as often as Pap tests.  Colorectal Cancer  This type of cancer can be detected and often prevented.  Routine colorectal cancer screening usually begins at 59 years of age and continues through 59 years of age.  Your health care provider may recommend screening at an earlier age if you have risk factors for colon cancer.  Your health care provider may also recommend using home test kits to check for hidden blood in the stool.  A small camera at the end of a  tube can be used to examine your colon directly (sigmoidoscopy or colonoscopy). This is done to check for the earliest forms of colorectal cancer.  Routine screening usually begins at age 89.  Direct examination of the colon should be repeated every 5-10 years through 60 years of age. However, you may need to be screened more often if early forms of precancerous polyps or small growths are found. Skin Cancer  Check your skin from head to toe regularly.  Tell your health care provider about any new moles or changes in moles, especially if there is a change in a mole's shape or color.  Also tell your health care provider if you have a mole that is larger than the size of a pencil eraser.  Always use sunscreen. Apply sunscreen liberally and repeatedly throughout the day.  Protect yourself by wearing long sleeves, pants, a wide-brimmed hat, and sunglasses whenever you are outside. HEART DISEASE, DIABETES, AND HIGH BLOOD PRESSURE   Have your blood pressure checked at least every 1-2 years. High blood pressure causes heart disease and increases the risk of stroke.  If you are between 69 years and 60 years old, ask your health care provider if  you should take aspirin to prevent strokes.  Have regular diabetes screenings. This involves taking a blood sample to check your fasting blood sugar level.  If you are at a normal weight and have a low risk for diabetes, have this test once every three years after 59 years of age.  If you are overweight and have a high risk for diabetes, consider being tested at a younger age or more often. PREVENTING INFECTION  Hepatitis B  If you have a higher risk for hepatitis B, you should be screened for this virus. You are considered at high risk for hepatitis B if:  You were born in a country where hepatitis B is common. Ask your health care provider which countries are considered high risk.  Your parents were born in a high-risk country, and you have not been immunized against hepatitis B (hepatitis B vaccine).  You have HIV or AIDS.  You use needles to inject street drugs.  You live with someone who has hepatitis B.  You have had sex with someone who has hepatitis B.  You get hemodialysis treatment.  You take certain medicines for conditions, including cancer, organ transplantation, and autoimmune conditions. Hepatitis C  Blood testing is recommended for:  Everyone born from [birth year] through 1966.  Anyone with known risk factors for hepatitis C. Sexually transmitted infections (STIs)  You should be screened for sexually transmitted infections (STIs) including gonorrhea and chlamydia if:  You are sexually active and are younger than 59 years of age.  You are older than 59 years of age and your health care provider tells you that you are at risk for this type of infection.  Your sexual activity has changed since you were last screened and you are at an increased risk for chlamydia or gonorrhea. Ask your health care provider if you are at risk.  If you do not have HIV, but are at risk, it may be recommended that you take a prescription medicine daily to prevent HIV infection. This is  called pre-exposure prophylaxis (PrEP). You are considered at risk if:  You are sexually active and do not regularly use condoms or know the HIV status of your partner(s).  You take drugs by injection.  You are sexually active with a partner who has HIV. Talk with  your health care provider about whether you are at high risk of being infected with HIV. If you choose to begin PrEP, you should first be tested for HIV. You should then be tested every 3 months for as long as you are taking PrEP.  PREGNANCY   If you are premenopausal and you may become pregnant, ask your health care provider about preconception counseling.  If you may become pregnant, take 400 to 800 micrograms (mcg) of folic acid every day.  If you want to prevent pregnancy, talk to your health care provider about birth control (contraception). OSTEOPOROSIS AND MENOPAUSE   Osteoporosis is a disease in which the bones lose minerals and strength with aging. This can result in serious bone fractures. Your risk for osteoporosis can be identified using a bone density scan.  If you are 71 years of age or older, or if you are at risk for osteoporosis and fractures, ask your health care provider if you should be screened.  Ask your health care provider whether you should take a calcium or vitamin D supplement to lower your risk for osteoporosis.  Menopause may have certain physical symptoms and risks.  Hormone replacement therapy may reduce some of these symptoms and risks. Talk to your health care provider about whether hormone replacement therapy is right for you.  HOME CARE INSTRUCTIONS   Schedule regular health, dental, and eye exams.  Stay current with your immunizations.   Do not use any tobacco products including cigarettes, chewing tobacco, or electronic cigarettes.  If you are pregnant, do not drink alcohol.  If you are breastfeeding, limit how much and how often you drink alcohol.  Limit alcohol intake to no more  than 1 drink per day for nonpregnant women. One drink equals 12 ounces of beer, 5 ounces of wine, or 1 ounces of hard liquor.  Do not use street drugs.  Do not share needles.  Ask your health care provider for help if you need support or information about quitting drugs.  Tell your health care provider if you often feel depressed.  Tell your health care provider if you have ever been abused or do not feel safe at home. Document Released: 05/28/2011 Document Revised: 03/29/2014 Document Reviewed: 10/14/2013 [facility] Patient Information 2015 [place], [place]. This information is not intended to replace advice given to you by your health care provider. Make sure you discuss any questions you have with your health care provider.

## 2014-07-13 DIAGNOSIS — Z0001 Encounter for general adult medical examination with abnormal findings: Secondary | ICD-10-CM | POA: Insufficient documentation

## 2014-07-13 DIAGNOSIS — J310 Chronic rhinitis: Secondary | ICD-10-CM | POA: Insufficient documentation

## 2014-07-13 LAB — T4 AND TSH
T4 TOTAL: 10.5 ug/dL (ref 4.5–12.0)
TSH: 7.04 u[IU]/mL — AB (ref 0.450–4.500)

## 2014-07-13 NOTE — Assessment & Plan Note (Signed)
Iatrogenic secondary to total thyroidectomy for CA.  Repeat TSh is pending and refills will be done after evaluation of today's TSH   Lab Results  Component Value Date   TSH 0.55 01/11/2014

## 2014-07-13 NOTE — Assessment & Plan Note (Signed)
Recommended trial of OTC antihistamines and saline flushes.  Delsym for PND induced cough vs tessalon .

## 2014-07-13 NOTE — Assessment & Plan Note (Signed)
S/p PTCA with balloon angioplasty of left  common iliac aretery  On jan 14th by Leotis Pain,  Repeat ultrasound planned by Dr. Fletcher Anon. Pulse is weak in left DP but foot is well perfused and cap refill is < 2 sec . She remains tobacco abstinent but does smell of tobacco today

## 2014-07-13 NOTE — Assessment & Plan Note (Signed)
Annual  wellness  exam was done as well as a comprehensive physical exam and management of acute and chronic conditions .  During the course of the visit the patient was educated and counseled about appropriate screening and preventive services including : fall prevention , diabetes screening, nutrition counseling, colorectal cancer screening, and recommended immunizations.  Printed recommendations for health maintenance screenings was given.  

## 2014-07-13 NOTE — Assessment & Plan Note (Addendum)
Recent recurrence of migraines relieved with resuming Plavix raises concern for cerebral atherosclerosis . Will consider MRA if symptoms recur ON plavix. Continue statin, plavix and baby asa if tolerated.

## 2014-07-13 NOTE — Assessment & Plan Note (Addendum)
Stress test and ECHO were normal. Symptoms resolved, has history of  hiatal hernia.

## 2014-07-14 ENCOUNTER — Encounter: Payer: Self-pay | Admitting: Internal Medicine

## 2014-07-14 ENCOUNTER — Other Ambulatory Visit: Payer: Self-pay | Admitting: Internal Medicine

## 2014-07-14 DIAGNOSIS — E079 Disorder of thyroid, unspecified: Secondary | ICD-10-CM

## 2014-07-14 DIAGNOSIS — E785 Hyperlipidemia, unspecified: Secondary | ICD-10-CM

## 2014-07-14 MED ORDER — LEVOTHYROXINE SODIUM 175 MCG PO TABS: 175.0000 ug | ORAL_TABLET | Freq: Every day | ORAL | Status: DC

## 2014-07-14 NOTE — Addendum Note (Signed)
Addended by: Wynonia Lawman E on: 07/14/2014 09:20 AM   Modules accepted: Orders

## 2014-08-02 ENCOUNTER — Telehealth: Payer: Self-pay | Admitting: Internal Medicine

## 2014-08-02 DIAGNOSIS — Z79899 Other long term (current) drug therapy: Secondary | ICD-10-CM

## 2014-08-02 DIAGNOSIS — E785 Hyperlipidemia, unspecified: Secondary | ICD-10-CM

## 2014-08-03 ENCOUNTER — Encounter: Payer: Self-pay | Admitting: Cardiovascular Disease

## 2014-08-03 ENCOUNTER — Ambulatory Visit (INDEPENDENT_AMBULATORY_CARE_PROVIDER_SITE_OTHER): Payer: BC Managed Care – PPO | Admitting: Cardiovascular Disease

## 2014-08-03 ENCOUNTER — Encounter (INDEPENDENT_AMBULATORY_CARE_PROVIDER_SITE_OTHER): Payer: BC Managed Care – PPO

## 2014-08-03 VITALS — BP 129/71 | HR 84 | Ht 67.0 in | Wt 135.2 lb

## 2014-08-03 DIAGNOSIS — I739 Peripheral vascular disease, unspecified: Secondary | ICD-10-CM

## 2014-08-03 DIAGNOSIS — E785 Hyperlipidemia, unspecified: Secondary | ICD-10-CM

## 2014-08-03 DIAGNOSIS — R0789 Other chest pain: Secondary | ICD-10-CM

## 2014-08-03 DIAGNOSIS — I700 Atherosclerosis of aorta: Secondary | ICD-10-CM

## 2014-08-03 MED ORDER — CLOPIDOGREL BISULFATE 75 MG PO TABS: 75.0000 mg | ORAL_TABLET | Freq: Once | ORAL | Status: DC

## 2014-08-03 MED ORDER — ATORVASTATIN CALCIUM 40 MG PO TABS: 40.0000 mg | ORAL_TABLET | Freq: Every day | ORAL | Status: DC

## 2014-08-03 NOTE — Telephone Encounter (Signed)
See mychart messages, pt states she is taking atorvastatin 40 mg. Ok refill? Not on current or old med list

## 2014-08-03 NOTE — Progress Notes (Signed)
Primary care physician: Dr. Derrel Nip.   HPI  This is a pleasant 58 year old Caucasian female who is here today for a followup visit regarding peripheral arterial disease.  She is status post left common iliac artery stent placement in November of 2014 for significant claudication. Other Medical history include previous tobacco use and hyperlipidemia  She has been doing very well and denies any recurrent claudication she does have occasional left hip discomfort mostly at rest and not with physical activities. I discontinued Plavix during last visit. However, she had significant worsening of migraine after that. She started taking Plavix every fifth day with significant improvement in migraine.  Allergies  Allergen Reactions  . Darvocet [Propoxyphene N-Acetaminophen] Nausea And Vomiting  . Demerol Nausea And Vomiting     Current Outpatient Prescriptions on File Prior to Visit  Medication Sig Dispense Refill  . aspirin 81 MG tablet Take 1 tablet (81 mg total) by mouth daily.  30 tablet    . atorvastatin (LIPITOR) 40 MG tablet Take 1 tablet (40 mg total) by mouth daily.  90 tablet  1  . benzonatate (TESSALON) 200 MG capsule Take 1 capsule (200 mg total) by mouth 3 (three) times daily as needed for cough.  60 capsule  0  . butorphanol (STADOL) 10 MG/ML nasal spray Place 1 spray into the nose Once PRN.  2.5 mL  2  . calcium carbonate (OS-CAL) 600 MG TABS Take 1,200 mg by mouth daily with breakfast.       . Cholecalciferol (VITAMIN D) 2000 UNITS tablet Take 2,000 Units by mouth daily.      . clopidogrel (PLAVIX) 75 MG tablet Take 75 mg by mouth once. ONE EVERY 5 DAYS      . levothyroxine (SYNTHROID, LEVOTHROID) 175 MCG tablet Take 1 tablet (175 mcg total) by mouth daily.  90 tablet  3  . Multiple Vitamin (MULTIVITAMIN) tablet Take 1 tablet by mouth daily.        . rizatriptan (MAXALT-MLT) 10 MG disintegrating tablet Take 1 tablet (10 mg total) by mouth as needed. May repeat in 2 hours if needed   10 tablet  3  . zolpidem (AMBIEN) 10 MG tablet Take 1 tablet (10 mg total) by mouth at bedtime as needed for sleep.  30 tablet  5  . zoster vaccine live, PF, (ZOSTAVAX) 67893 UNT/0.65ML injection Inject 19,400 Units into the skin once.  1 each  0   No current facility-administered medications on file prior to visit.     Past Medical History  Diagnosis Date  . Hypocalcemia   . Migraine     improved since thyroid surgery, 2 years  . Thyroid cancer 2007    s/p thyroidectomy  . Hyperlipidemia   . hypothyroidism 2007    acquired, secondary to surgery for thyroid Ca  . Carotid artery occlusion   . 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 Repeat angiography in November of 2014 showed 50% distal aortic stenosis extending into the left common iliac artery which had 60-70% ostial stenosis. No other significant disease. I placed on balloon expandable stent into  left common iliac artery extending slightly into the distal aorta     Past Surgical History  Procedure Laterality Date  . Thyroid surgery  2007    secondary to thyroid cancer  . Angioplasty illiac artery    . Cesarean section    . Nose  surgery    . Skin cancer excision       Family History  Problem Relation Age of Onset  . Coronary artery disease Maternal Grandmother   . Heart disease Maternal Grandmother 70  . Coronary artery disease Paternal Grandmother 56  . Heart disease Paternal Grandmother 63  . Alzheimer's disease Father   . Stroke Mother      History   Social History  . Marital Status: Married    Spouse Name: N/A    Number of Children: N/A  . Years of Education: N/A   Occupational History  . Not on file.   Social History Main Topics  . Smoking status: Former Smoker -- 1.00 packs/day for 20 years    Types: Cigarettes    Quit date: 10/28/2011  . Smokeless tobacco: Never Used  . Alcohol Use: No  . Drug  Use: No  . Sexual Activity: Not on file   Other Topics Concern  . Not on file   Social History Narrative  . No narrative on file       PHYSICAL EXAM   BP 129/71  Pulse 84  Ht 5\' 7"  (1.702 m)  Wt 135 lb 4 oz (61.349 kg)  BMI 21.18 kg/m2 Constitutional: She is oriented to person, place, and time. She appears well-developed and well-nourished. No distress.  HENT: No nasal discharge.  Head: Normocephalic and atraumatic.  Eyes: Pupils are equal and round. Right eye exhibits no discharge. Left eye exhibits no discharge.  Neck: Normal range of motion. Neck supple. No JVD present. No thyromegaly present. No carotid bruits. Cardiovascular: Normal rate, regular rhythm, normal heart sounds. Exam reveals no gallop and no friction rub. No murmur heard.  Pulmonary/Chest: Effort normal and breath sounds normal. No stridor. No respiratory distress. She has no wheezes. She has no rales. She exhibits no tenderness.  Abdominal: Soft. Bowel sounds are normal. She exhibits no distension. There is no tenderness. There is no rebound and no guarding.  Musculoskeletal: Normal range of motion. She exhibits no edema and no tenderness.  Neurological: She is alert and oriented to person, place, and time. Coordination normal.  Skin: Skin is warm and dry. No rash noted. She is not diaphoretic. No erythema. No pallor.  Psychiatric: She has a normal mood and affect. Her behavior is normal. Judgment and thought content normal.  Vascular: Radial pulses are normal bilaterally.  Posterior tibial: Normal bilaterally. Dorsalis pedis: Normal on the right side and mildly diminished on the left side  QPY:[id number]  Rhythm  WITHIN NORMAL LIMITS  ASSESSMENT AND PLAN

## 2014-08-03 NOTE — Assessment & Plan Note (Signed)
Interestingly, migraine improved significantly with Plavix. There has been multiple reports in the literature about improvement of migraine with Plavix. This has not been proven in clinical trials as far as  I am aware. Previous studies of PFO closure for migraine suggested that the frequency of migraine was much less in patients who continue to take Plavix versus aspirin alone. I think it is reasonable to continue Plavix Every 5 days  given the significant improvement in her symptoms.

## 2014-08-03 NOTE — Assessment & Plan Note (Signed)
She was switched recently to atorvastatin. I recommend a target LDL of less than 70.

## 2014-08-03 NOTE — Patient Instructions (Addendum)
Your physician has requested that you have an abdominal aorta duplex in 1 year. During this test, an ultrasound is used to evaluate the aorta. Allow 30 minutes for this exam. Do not eat after midnight the day before and avoid carbonated beverages  Your physician wants you to follow-up in: In one year with Dr. Fletcher Anon on the sam day as your Aorto Iliac Duplex. You will receive a reminder letter in the mail two months in advance. If you don't receive a letter, please call our office to schedule the follow-up appointment.  Your Plavix has been refilled

## 2014-08-03 NOTE — Assessment & Plan Note (Signed)
She is doing very well with no recurrent claudication. ABI today was normal and aortoiliac duplex showed widely patent stent. I will have her followup in one year with repeated studies.

## 2014-08-03 NOTE — Telephone Encounter (Signed)
The atorvastatin can be refilled.Tracie Morris need fasting lipids done in 6 weeks.,  For dose adjustment because Dr Fletcher Anon wants her LDL < 70

## 2014-08-11 ENCOUNTER — Telehealth: Payer: Self-pay

## 2014-08-11 NOTE — Telephone Encounter (Signed)
Tracie Morris needs auth for Plavix. pleae call

## 2014-08-12 ENCOUNTER — Telehealth: Payer: Self-pay | Admitting: *Deleted

## 2014-08-12 ENCOUNTER — Encounter: Payer: Self-pay | Admitting: Cardiovascular Disease

## 2014-08-12 NOTE — Telephone Encounter (Signed)
error 

## 2014-08-12 NOTE — Telephone Encounter (Signed)
Informed Mickel Baas that the rx is correct  Please dispense as written  Mickel Baas verbalized understanding

## 2014-08-30 ENCOUNTER — Other Ambulatory Visit (INDEPENDENT_AMBULATORY_CARE_PROVIDER_SITE_OTHER): Payer: BC Managed Care – PPO

## 2014-08-30 ENCOUNTER — Telehealth: Payer: Self-pay | Admitting: Internal Medicine

## 2014-08-30 DIAGNOSIS — E079 Disorder of thyroid, unspecified: Secondary | ICD-10-CM

## 2014-08-30 DIAGNOSIS — M62838 Other muscle spasm: Secondary | ICD-10-CM

## 2014-08-30 DIAGNOSIS — Z79899 Other long term (current) drug therapy: Secondary | ICD-10-CM

## 2014-08-30 DIAGNOSIS — E785 Hyperlipidemia, unspecified: Secondary | ICD-10-CM

## 2014-08-30 LAB — COMPREHENSIVE METABOLIC PANEL
ALT: 13 U/L (ref 0–35)
AST: 16 U/L (ref 0–37)
Albumin: 3.6 g/dL (ref 3.5–5.2)
Alkaline Phosphatase: 96 U/L (ref 39–117)
BILIRUBIN TOTAL: 0.5 mg/dL (ref 0.2–1.2)
BUN: 15 mg/dL (ref 6–23)
CALCIUM: 8.7 mg/dL (ref 8.4–10.5)
CHLORIDE: 103 meq/L (ref 96–112)
CO2: 23 mEq/L (ref 19–32)
CREATININE: 0.8 mg/dL (ref 0.4–1.2)
GFR: 84.02 mL/min (ref >60.00)
Glucose, Bld: 126 mg/dL — ABNORMAL HIGH (ref 70–99)
Potassium: 3.7 mEq/L (ref 3.5–5.1)
Sodium: 135 mEq/L (ref 135–145)
Total Protein: 7.2 g/dL (ref 6.0–8.3)

## 2014-08-30 LAB — LIPID PANEL
CHOL/HDL RATIO: 4
Cholesterol: 127 mg/dL (ref 0–200)
HDL: 34.2 mg/dL — AB (ref >39.00)
LDL Cholesterol: 65 mg/dL (ref 0–99)
NonHDL: 92.8
Triglycerides: 140 mg/dL (ref 0.0–149.0)
VLDL: 28 mg/dL (ref 0.0–40.0)

## 2014-08-30 LAB — TSH: TSH: 0.33 u[IU]/mL — AB (ref 0.35–4.50)

## 2014-08-30 MED ORDER — DICLOFENAC SODIUM 75 MG PO TBEC: 75.0000 mg | DELAYED_RELEASE_TABLET | Freq: Two times a day (BID) | ORAL | Status: DC

## 2014-08-30 MED ORDER — METHOCARBAMOL 500 MG PO TABS: 500.0000 mg | ORAL_TABLET | Freq: Four times a day (QID) | ORAL | Status: DC
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
## 2014-08-30 NOTE — Telephone Encounter (Signed)
Se the diclofenac twice daily instead of ibuprofen,  Use the robaxin every 6 hours prn muscle spasm.

## 2014-08-30 NOTE — Telephone Encounter (Signed)
Left message for return call.

## 2014-08-30 NOTE — Telephone Encounter (Signed)
Patient also would like Mammogram scheduled.

## 2014-08-31 ENCOUNTER — Encounter: Payer: Self-pay | Admitting: Internal Medicine

## 2014-08-31 NOTE — Telephone Encounter (Signed)
Patient notified and voiced understanding.

## 2014-09-10 ENCOUNTER — Other Ambulatory Visit: Payer: Self-pay

## 2014-10-04 ENCOUNTER — Encounter: Payer: Self-pay | Admitting: Internal Medicine

## 2014-10-04 ENCOUNTER — Ambulatory Visit: Payer: Self-pay | Admitting: Internal Medicine

## 2014-10-04 ENCOUNTER — Ambulatory Visit: Payer: Self-pay

## 2014-10-04 LAB — URINALYSIS, COMPLETE
Bilirubin,UR: NEGATIVE
GLUCOSE, UR: NEGATIVE
Ketone: NEGATIVE
Nitrite: POSITIVE
Ph: 5 (ref 5.0–8.0)
Protein: 30
Renal Epithelial: NONE SEEN
Specific Gravity: 1.02 (ref 1.000–1.030)

## 2014-10-04 LAB — HM MAMMOGRAPHY: HM Mammogram: NORMAL

## 2014-10-06 ENCOUNTER — Telehealth: Payer: Self-pay | Admitting: Internal Medicine

## 2014-10-06 LAB — URINE CULTURE

## 2014-10-06 NOTE — Telephone Encounter (Signed)
If her urinary symptoms resolve,  She does not need to follow up for a urinary tract infection.  If they do not,  She canl be worked in for ra repeat UA and culture 1 week

## 2014-10-06 NOTE — Telephone Encounter (Signed)
The patient was seen at [facility] urgent care in [facility] for a Kidney infection the physician there recommended that she call her primary to schedule repeat lab work for the infection with in 2 weeks. Should she make an office visit or just repeat labs.

## 2014-10-06 NOTE — Telephone Encounter (Signed)
Left message, notifying patient. 

## 2014-10-06 NOTE — Telephone Encounter (Signed)
Records for [place] urgent care in red folder.

## 2014-10-06 NOTE — Telephone Encounter (Signed)
Faxed for copy of records.

## 2014-11-04 ENCOUNTER — Encounter (HOSPITAL_COMMUNITY): Payer: Self-pay | Admitting: Cardiovascular Disease

## 2015-01-10 ENCOUNTER — Ambulatory Visit: Payer: BC Managed Care – PPO | Admitting: Internal Medicine

## 2015-01-12 ENCOUNTER — Encounter: Payer: Self-pay | Admitting: Internal Medicine

## 2015-01-12 MED ORDER — ZOLPIDEM TARTRATE 10 MG PO TABS: 10.0000 mg | ORAL_TABLET | Freq: Every evening | ORAL | Status: DC | PRN

## 2015-01-12 NOTE — Telephone Encounter (Signed)
Faxed to pharmacy

## 2015-01-12 NOTE — Telephone Encounter (Signed)
Refill automatically reduced to 5  Mg due to DEA requirements for patients her age

## 2015-01-31 ENCOUNTER — Encounter: Payer: Self-pay | Admitting: Internal Medicine

## 2015-01-31 ENCOUNTER — Ambulatory Visit (INDEPENDENT_AMBULATORY_CARE_PROVIDER_SITE_OTHER)
Admission: RE | Admit: 2015-01-31 | Discharge: 2015-01-31 | Disposition: A | Discharge status: Home / Self Care | Payer: BLUE CROSS/BLUE SHIELD | Source: Ambulatory Visit | Attending: Internal Medicine | Admitting: Internal Medicine

## 2015-01-31 ENCOUNTER — Ambulatory Visit (INDEPENDENT_AMBULATORY_CARE_PROVIDER_SITE_OTHER): Payer: BLUE CROSS/BLUE SHIELD | Admitting: Internal Medicine

## 2015-01-31 VITALS — BP 138/70 | HR 99 | Temp 98.7°F | Resp 16 | Ht 67.0 in | Wt 131.5 lb

## 2015-01-31 DIAGNOSIS — E038 Other specified hypothyroidism: Secondary | ICD-10-CM

## 2015-01-31 DIAGNOSIS — E785 Hyperlipidemia, unspecified: Secondary | ICD-10-CM

## 2015-01-31 DIAGNOSIS — M542 Cervicalgia: Secondary | ICD-10-CM

## 2015-01-31 DIAGNOSIS — E034 Atrophy of thyroid (acquired): Secondary | ICD-10-CM

## 2015-01-31 DIAGNOSIS — I739 Peripheral vascular disease, unspecified: Secondary | ICD-10-CM

## 2015-01-31 DIAGNOSIS — M5010 Cervical disc disorder with radiculopathy, unspecified cervical region: Secondary | ICD-10-CM

## 2015-01-31 DIAGNOSIS — C801 Malignant (primary) neoplasm, unspecified: Secondary | ICD-10-CM

## 2015-01-31 DIAGNOSIS — M47812 Spondylosis without myelopathy or radiculopathy, cervical region: Secondary | ICD-10-CM

## 2015-01-31 DIAGNOSIS — E079 Disorder of thyroid, unspecified: Secondary | ICD-10-CM

## 2015-01-31 DIAGNOSIS — G43809 Other migraine, not intractable, without status migrainosus: Secondary | ICD-10-CM

## 2015-01-31 DIAGNOSIS — G4700 Insomnia, unspecified: Secondary | ICD-10-CM

## 2015-01-31 MED ORDER — CYCLOBENZAPRINE HCL 10 MG PO TABS
10.0000 mg | ORAL_TABLET | Freq: Three times a day (TID) | ORAL | Status: DC | PRN
Start: 2015-01-31 — End: 2015-07-21

## 2015-01-31 MED ORDER — ALPRAZOLAM 0.5 MG PO TABS: 0.5000 mg | ORAL_TABLET | Freq: Every evening | ORAL | Status: DC | PRN

## 2015-01-31 MED ORDER — BUTORPHANOL TARTRATE 10 MG/ML NA SOLN: 1.0000 | Freq: Once | NASAL | Status: DC | PRN

## 2015-01-31 MED ORDER — ROSUVASTATIN CALCIUM 20 MG PO TABS: 20.0000 mg | ORAL_TABLET | Freq: Every day | ORAL | Status: DC

## 2015-01-31 NOTE — Assessment & Plan Note (Signed)
Managed with one plavix every 10 to 12 days  .

## 2015-01-31 NOTE — Progress Notes (Signed)
Patient ID: Tracie Morris, female   DOB: 10-12-1956, 59 y.o.   MRN: [id number]  Patient Active Problem List   Diagnosis Date Noted  . Insomnia, persistent 02/01/2015  . Osteoarthritis cervical spine 02/01/2015  . Encounter for preventive health examination 07/13/2014  . Rhinitis 07/13/2014  . S/P Nissen fundoplication (with gastrostomy tube placement) 01/11/2014  . Atypical chest pain 07/28/2013  . Peripheral arterial disease   . Post menopausal syndrome 07/07/2013  . Peripheral vascular disease with pain at rest 05/16/2012  . Screening for cervical cancer 10/20/2011  . Screening for breast cancer 10/20/2011  . Screening for colon cancer 10/20/2011  . Thyroid cancer   . hypothyroidism   . Hyperlipidemia   . Migraine     Subjective:  CC:   Chief Complaint  Patient presents with  . Follow-up    6 month general,  stopped Ambien due to sleep walking , anxiety ,trembling , and rapid heartt rate.    HPI:   Tracie Morris is a 59 y.o. female who presents for   Follow up on PAD, iatrogenic hypothyroidism secondary to thyroid Ca,  Hyperlipidemia with history of PAD, insomnia and neck pain.   She stopped ambien due  Having several episodes of sleep shopping ( ordering merchandise online while sleep) with the 10 mg dose which she started taking,  Still was only sleeping 4 hours per day .  Felt that her brain was overactive and that  she was multitasking all the time.  Has tried OTC supplements including melatonin, ineffective, and tried Benadryl  Too sedating with morning hangover,   Thyroid:  She has a history of thyroidectomy and had been having  an elevated pulse and palpitations with current dose of Synthroid,  Has been skipping one one dose per week.  Feels better now with exception of insomnia.   PAD: denies cluadication symptoms.  Not smoking, taking asa and statin but having myalgias with lipitor, Wants to resume crestor .  Using Plavix not daily,  for migraine prevention    Neck pain:  Has been letting Dr Laveda Abbe manipulate her neck for neck pain  That radiates to right side of head. No history of trauma or injury,  No prior workup,  Denies radiation to arms.  Pain can be severe at times.    Past Medical History  Diagnosis Date  . Hypocalcemia   . Migraine     improved since thyroid surgery, 2 years  . Thyroid cancer 2007    s/p thyroidectomy  . Hyperlipidemia   . hypothyroidism 2007    acquired, secondary to surgery for thyroid Ca  . Carotid artery occlusion   . 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 Repeat angiography in November of 2014 showed 50% distal aortic stenosis extending into the left common iliac artery which had 60-70% ostial stenosis. No other significant disease. I placed on balloon expandable stent into  left common iliac artery extending slightly into the distal aorta    Past Surgical History  Procedure Laterality Date  . Thyroid surgery  2007    secondary to thyroid cancer  . Angioplasty illiac artery    . Cesarean section    . Nose surgery    . Skin cancer excision    . Abdominal aortagram N/A 10/14/2013    Procedure: ABDOMINAL [name];  Surgeon: Wellington Hampshire, MD;  Location: [facility] CATH LAB;  Service: Cardiovascular;  Laterality: N/A;       The following portions of the patient's history were reviewed and updated as appropriate: Allergies, current medications, and problem list.    Review of Systems:   Patient denies headache, fevers, malaise, unintentional weight loss, skin rash, eye pain, sinus congestion and sinus pain, sore throat, dysphagia,  hemoptysis , cough, dyspnea, wheezing, chest pain, palpitations, orthopnea, edema, abdominal pain, nausea, melena, diarrhea, constipation, flank pain, dysuria, hematuria, urinary  Frequency, nocturia, numbness, tingling, seizures,  Focal weakness, Loss of consciousness,  Tremor,  insomnia, depression, anxiety, and suicidal ideation.     History   Social History  . Marital Status: Married    Spouse Name: N/A  . Number of Children: N/A  . Years of Education: N/A   Occupational History  . Not on file.   Social History Main Topics  . Smoking status: Former Smoker -- 1.00 packs/day for 20 years    Types: Cigarettes    Quit date: 10/28/2011  . Smokeless tobacco: Never Used  . Alcohol Use: No  . Drug Use: No  . Sexual Activity: Not on file   Other Topics Concern  . Not on file   Social History Narrative    Objective:  Filed Vitals:   01/31/15 1141  BP: 138/70  Pulse: 99  Temp: 98.7 F (37.1 C)  Resp: 16     General appearance: alert, cooperative and appears stated age Ears: normal TM's and external ear canals both ears Throat: lips, mucosa, and tongue normal; teeth and gums normal Neck: no adenopathy, no carotid bruit, supple, symmetrical, trachea midline and thyroid not enlarged, symmetric, no tenderness/mass/nodules Back: symmetric, no curvature. ROM normal. No CVA tenderness. Lungs: clear to auscultation bilaterally Heart: regular rate and rhythm, S1, S2 normal, no murmur, click, rub or gallop Abdomen: soft, non-tender; bowel sounds normal; no masses,  no organomegaly Pulses: 2+ and symmetric Skin: Skin color, texture, turgor normal. No rashes or lesions Lymph nodes: Cervical, supraclavicular, and axillary nodes normal.  Assessment and Plan:  Migraine Managed with one plavix every 10 to 12 days  .     Thyroid cancer TSH is appropriatelhy suppressed but T4 is slightly high on current dose of 175 mcg .  Will have patient omit doses on Saturdays and Sundays and repeat in 6 weeks.    Hyperlipidemia She tolerated crestor without side effects and did not tolerate atorvastatin . Resume Crestor and repeat labs in 6 weeks .    Lab Results  Component Value Date   CHOL 127 08/30/2014   HDL 34.20* 08/30/2014   LDLCALC 65 08/30/2014    TRIG 140.0 08/30/2014   CHOLHDL 4 08/30/2014   Lab Results  Component Value Date   ALT 13 08/30/2014   AST 16 08/30/2014   ALKPHOS 96 08/30/2014   BILITOT 0.5 08/30/2014        Peripheral arterial disease S/p PTCA with balloon angioplasty of left  common iliac aretry  On jan 14th by Leotis Pain,  Repeat ABIs by Dr. Fletcher Anon were normal Sept 2015.  One year follow up advised, . She remains tobacco abstinent , continues to take plavix several days per , more for migraine prevention, statin and ASA daily    Insomnia, persistent Did not tolerate Ambien due to reports of sleep shopping (patient placed several on line orders with no memory of events). switch to alprazolam    Osteoarthritis cervical spine Patient advised to stop allowing manipulation of cervical spine due to risk of basilar or vertebral  artery dissection .  Plain films suggest that degenerative changes have caused  bone spurring at the C4 level that may be caused subtle stenosis resulting in radiculopathy to scalp. ,  MrI offered.   A total of 40 minutes was spent with patient more than half of which was spent in counseling patient on the above mentioned issues , reviewing and explaining recent labs and imaging studies done, and coordination of care.  Updated Medication List Outpatient Encounter Prescriptions as of 01/31/2015  Medication Sig  . aspirin 81 MG tablet Take 1 tablet (81 mg total) by mouth daily.  . benzonatate (TESSALON) 200 MG capsule Take 1 capsule (200 mg total) by mouth 3 (three) times daily as needed for cough.  . butorphanol (STADOL) 10 MG/ML nasal spray Place 1 spray into the nose Once PRN.  . calcium carbonate (OS-CAL) 600 MG TABS Take 1,200 mg by mouth daily with breakfast.   . Cholecalciferol (VITAMIN D) 2000 UNITS tablet Take 2,000 Units by mouth daily.  . clopidogrel (PLAVIX) 75 MG tablet Take 1 tablet (75 mg total) by mouth once. ONE EVERY 5 DAYS  . diclofenac (VOLTAREN) 75 MG EC tablet Take 1 tablet  (75 mg total) by mouth 2 (two) times daily.  Marland Kitchen levothyroxine (SYNTHROID, LEVOTHROID) 175 MCG tablet Take 1 tablet (175 mcg total) by mouth daily.  . Multiple Vitamin (MULTIVITAMIN) tablet Take 1 tablet by mouth daily.    . rizatriptan (MAXALT-MLT) 10 MG disintegrating tablet Take 1 tablet (10 mg total) by mouth as needed. May repeat in 2 hours if needed  . zoster vaccine live, PF, (ZOSTAVAX) 38453 UNT/0.65ML injection Inject 19,400 Units into the skin once.  . [DISCONTINUED] atorvastatin (LIPITOR) 40 MG tablet Take 1 tablet (40 mg total) by mouth daily.  . [DISCONTINUED] butorphanol (STADOL) 10 MG/ML nasal spray Place 1 spray into the nose Once PRN.  . [DISCONTINUED] levothyroxine (SYNTHROID, LEVOTHROID) 175 MCG tablet Take 1 tablet (175 mcg total) by mouth daily.  . [DISCONTINUED] methocarbamol (ROBAXIN) 500 MG tablet Take 1 tablet (500 mg total) by mouth 4 (four) times daily.  Marland Kitchen ALPRAZolam (XANAX) 0.5 MG tablet Take 1 tablet (0.5 mg total) by mouth at bedtime as needed for anxiety.  . cyclobenzaprine (FLEXERIL) 10 MG tablet Take 1 tablet (10 mg total) by mouth 3 (three) times daily as needed for muscle spasms.  . rosuvastatin (CRESTOR) 20 MG tablet Take 1 tablet (20 mg total) by mouth daily.  . [DISCONTINUED] zolpidem (AMBIEN) 10 MG tablet Take 1 tablet (10 mg total) by mouth at bedtime as needed for sleep.     Orders Placed This Encounter  Procedures  . DG Cervical Spine Complete  . T4 AND TSH    No Follow-up on file.

## 2015-01-31 NOTE — Patient Instructions (Addendum)
You do not need a pneumonia vaccine at this time.  I have documented your previous pneumonia vaccine  I have prescribed alprazolam for your insomnia   Your plain films of the cervical spine are ordered and you can go to [facility] any time

## 2015-01-31 NOTE — Progress Notes (Signed)
Pre-visit discussion using our clinic review tool. No additional management support is needed unless otherwise documented below in the visit note.  

## 2015-02-01 ENCOUNTER — Encounter: Payer: Self-pay | Admitting: Internal Medicine

## 2015-02-01 DIAGNOSIS — G4700 Insomnia, unspecified: Secondary | ICD-10-CM | POA: Insufficient documentation

## 2015-02-01 DIAGNOSIS — M47812 Spondylosis without myelopathy or radiculopathy, cervical region: Secondary | ICD-10-CM | POA: Insufficient documentation

## 2015-02-01 LAB — T4 AND TSH
T4, Total: 13.2 ug/dL — ABNORMAL HIGH (ref 4.5–12.0)
TSH: 0.373 u[IU]/mL — AB (ref 0.450–4.500)

## 2015-02-01 MED ORDER — LEVOTHYROXINE SODIUM 175 MCG PO TABS: 175.0000 ug | ORAL_TABLET | Freq: Every day | ORAL | Status: DC

## 2015-02-01 NOTE — Assessment & Plan Note (Addendum)
TSH is appropriatelhy suppressed but T4 is slightly high on current dose of 175 mcg .  Will have patient omit doses on Saturdays and Sundays and repeat in 6 weeks.

## 2015-02-01 NOTE — Assessment & Plan Note (Signed)
Patient advised to stop allowing manipulation of cervical spine due to risk of basilar or vertebral artery dissection .  Plain films suggest that degenerative changes have caused  bone spurring at the C4 level that may be caused subtle stenosis resulting in radiculopathy to scalp. ,  MrI offered.

## 2015-02-01 NOTE — Assessment & Plan Note (Signed)
She tolerated crestor without side effects and did not tolerate atorvastatin . Resume Crestor and repeat labs in 6 weeks .    Lab Results  Component Value Date   CHOL 127 08/30/2014   HDL 34.20* 08/30/2014   LDLCALC 65 08/30/2014   TRIG 140.0 08/30/2014   CHOLHDL 4 08/30/2014   Lab Results  Component Value Date   ALT 13 08/30/2014   AST 16 08/30/2014   ALKPHOS 96 08/30/2014   BILITOT 0.5 08/30/2014

## 2015-02-01 NOTE — Assessment & Plan Note (Signed)
Did not tolerate Ambien due to reports of sleep shopping (patient placed several on line orders with no memory of events). switch to alprazolam

## 2015-02-01 NOTE — Assessment & Plan Note (Addendum)
S/p PTCA with balloon angioplasty of left  common iliac aretry  On jan 14th by Leotis Pain,  Repeat ABIs by Dr. Fletcher Anon were normal Sept 2015.  One year follow up advised, . She remains tobacco abstinent , continues to take plavix several days per , more for migraine prevention, statin and ASA daily

## 2015-02-17 ENCOUNTER — Emergency Department: Payer: Self-pay | Admitting: Emergency Medicine

## 2015-02-17 ENCOUNTER — Telehealth: Payer: Self-pay | Admitting: Cardiovascular Disease

## 2015-02-17 LAB — CBC
HCT: 45.4 % (ref 35.0–47.0)
HGB: 15.7 g/dL (ref 12.0–16.0)
MCH: 32.6 pg (ref 26.0–34.0)
MCHC: 34.5 g/dL (ref 32.0–36.0)
MCV: 95 fL (ref 80–100)
PLATELETS: 207 10*3/uL (ref 150–440)
RBC: 4.8 10*6/uL (ref 3.80–5.20)
RDW: 13.1 % (ref 11.5–14.5)
WBC: 9.7 10*3/uL (ref 3.6–11.0)

## 2015-02-17 LAB — BASIC METABOLIC PANEL
Anion Gap: 9 (ref 7–16)
BUN: 20 mg/dL
CO2: 23 mmol/L
CREATININE: 0.97 mg/dL
Calcium, Total: 9.5 mg/dL
Chloride: 105 mmol/L
EGFR (African American): >60
Glucose: 224 mg/dL — ABNORMAL HIGH
POTASSIUM: 3.7 mmol/L
SODIUM: 137 mmol/L

## 2015-02-17 LAB — TSH: THYROID STIMULATING HORM: 0.253 u[IU]/mL — AB

## 2015-02-17 LAB — TROPONIN I: Troponin-I: <0.03 ng/mL

## 2015-02-17 LAB — T4, FREE: Free Thyroxine: 1.65 ng/dL — ABNORMAL HIGH

## 2015-02-17 NOTE — Telephone Encounter (Signed)
Pt c/o Shortness Of Breath: STAT if SOB developed within the last 24 hours or pt is noticeably SOB on the phone  1. Are you currently SOB (can you hear that pt is SOB on the phone)? Yes  2. How long have you been experiencing SOB? This morning  3. Are you SOB when sitting or when up moving around? Up moving around  4. Are you currently experiencing any other symptoms? Pt calling stating that her heart is beating too fast and having significant sob.

## 2015-02-17 NOTE — Telephone Encounter (Signed)
Calling stating she woke up this AM with SOB, heart racing and irregular.  Went to rescue unit near her home and they did an EKG and told her she needed to see her cardiologist. States did not tell her the HR or what EKG showed.  They did give her a copy of recording.  Also during conversation she states her PCP Dr. Derrel Nip changed her thyroid medication on 3/8.  During conversation she states she is still a little SOB and heart seems to be fast.  Took BP while on phone and reading was 171/104 with HR 105.  Spoke w/Chris Berge,NP who states she needs to go to [place] office in [place] to make them aware of pt going to ER.  She verbalizes understanding and will go to ER.  Advised to take copy of the EKG reading done by rescue team.

## 2015-03-18 NOTE — Op Note (Signed)
PATIENT NAME:  Tracie Morris, Tracie Morris MR#:  [id number] DATE OF BIRTH:  [birth year]  DATE OF PROCEDURE:  12/22/2012  PREOPERATIVE DIAGNOSES: 1. Peripheral arterial disease with claudication, left lower extremity.  2. Hyperlipidemia.   POSTOPERATIVE DIAGNOSES:  1. Peripheral arterial disease with claudication, left lower extremity.  2. Hyperlipidemia.   PROCEDURES PERFORMED: 1. Catheter placement into left common femoral artery from right femoral approach.  2. Aortogram and selective left lower extremity angiogram.  3. Percutaneous transluminal angioplasty of left common iliac artery with 6 and 7 mm diameter angioplasty balloon.  4. StarClose closure device, right femoral artery.   SURGEON: Algernon Huxley, M.D.   ANESTHESIA: Local with moderate conscious sedation.   ESTIMATED BLOOD LOSS: Minimal.   FLUOROSCOPY TIME: 3.1 minutes.   CONTRAST USED: 58 mL.   INDICATION FOR PROCEDURE: This is a 59 year old white female with left lower extremity claudication. She has had a long trial of conservative management with no significant improvement in her symptoms and her claudication symptoms are lifestyle limiting. Noninvasive study had shown diminished flow on the left compared to the right and she is brought in for angiography for further evaluation and possible treatment. Risks and benefits were discussed and informed consent was obtained.   DESCRIPTION OF PROCEDURE: The patient was brought to the vascular and interventional radiology suite. Groins were shaved and prepped and a sterile surgical field was created. The right femoral head was localized with fluoroscopy and the right femoral artery was accessed without difficulty with a Seldinger needle. A J-wire and 5 French sheath were placed. A pigtail catheter was placed in the aorta, at the L1-L2 level, and then pulled out of the aortic bifurcation where pelvic obliques were performed. This demonstrated single normal renal arteries bilaterally. The aorta  was patent. The right iliac system was patent. Neither of these had significant stenosis. The left common iliac artery was occluded just beyond its origin and reconstituted likely through pelvic collaterals and translumbar collaterals to the hypogastric artery and the external iliac artery was patent. The remainder of the left lower extremity appeared to have preserved flow. This was confirmed when the catheter was used to cross the lesion, placed in the common femoral artery and imaging was performed of the left lower extremity. She had two-vessel runoff distally and no stenosis in the SFA, profunda femoris, common femoral or popliteal arteries. The patient was systemically heparinized. The pigtail catheter and the Terumo Advantage wire were used to cross the lesion without significant difficulty. I confirmed intraluminal flow, in the iliac system below the occlusion, once I crossed the lesion. I then replaced the wire and placed a 6 French Ansel sheath. Again, the patient was given a total of 4000 units of intravenous heparin. A 6 mm diameter angioplasty balloon was inflated, in the left common iliac artery, to treat the lesion, and then a 7 mm diameter angioplasty balloon was used at the origin of the left common iliac artery. Completion angiogram now showed the left iliac system to be patent. There was a shelf of aortic plaque that was causing a 40 to 50% stenosis, at the origin of left common iliac artery, but this was not going to be easily treated from the right femoral approach we had taken today and, after systemically heparinizing the patient, I did not want to access the left femoral artery for kissing balloon stents. In addition, there was markedly improved flow and this did not appear to be a flow limiting at this point so I felt  that angioplasty alone would be appropriate. At this point, I elected to terminate the procedure. The sheath was removed and StarClose closure device deployed in the usual  fashion with excellent hemostatic result. The patient tolerated the procedure well and was taken to the recovery room in stable condition.  ____________________________ Algernon Huxley, MD jsd:[id number] D: 12/22/2012 09:51:00 ET T: 12/22/2012 09:59:12 ET JOB#: [id number]  cc: Algernon Huxley, MD, <Dictator> Deborra Medina, MD Algernon Huxley MD ELECTRONICALLY SIGNED 12/24/2012 7:34

## 2015-06-30 ENCOUNTER — Other Ambulatory Visit: Payer: Self-pay

## 2015-06-30 ENCOUNTER — Encounter: Payer: Self-pay | Admitting: Internal Medicine

## 2015-06-30 MED ORDER — ALPRAZOLAM 0.5 MG PO TABS: 0.5000 mg | ORAL_TABLET | Freq: Every evening | ORAL | Status: DC | PRN

## 2015-07-01 ENCOUNTER — Other Ambulatory Visit: Payer: Self-pay | Admitting: Cardiovascular Disease

## 2015-07-01 DIAGNOSIS — I779 Disorder of arteries and arterioles, unspecified: Secondary | ICD-10-CM

## 2015-07-11 ENCOUNTER — Other Ambulatory Visit (INDEPENDENT_AMBULATORY_CARE_PROVIDER_SITE_OTHER): Payer: BLUE CROSS/BLUE SHIELD

## 2015-07-11 DIAGNOSIS — E079 Disorder of thyroid, unspecified: Secondary | ICD-10-CM

## 2015-07-11 DIAGNOSIS — E785 Hyperlipidemia, unspecified: Secondary | ICD-10-CM | POA: Diagnosis not present

## 2015-07-11 DIAGNOSIS — E034 Atrophy of thyroid (acquired): Secondary | ICD-10-CM

## 2015-07-11 LAB — LIPID PANEL
CHOLESTEROL: 145 mg/dL (ref 0–200)
HDL: 39.5 mg/dL (ref >39.00)
LDL Cholesterol: 85 mg/dL (ref 0–99)
NonHDL: 105.87
Total CHOL/HDL Ratio: 4
Triglycerides: 103 mg/dL (ref 0.0–149.0)
VLDL: 20.6 mg/dL (ref 0.0–40.0)

## 2015-07-12 ENCOUNTER — Ambulatory Visit: Payer: BLUE CROSS/BLUE SHIELD

## 2015-07-12 ENCOUNTER — Ambulatory Visit (INDEPENDENT_AMBULATORY_CARE_PROVIDER_SITE_OTHER): Payer: BLUE CROSS/BLUE SHIELD

## 2015-07-12 DIAGNOSIS — I779 Disorder of arteries and arterioles, unspecified: Secondary | ICD-10-CM

## 2015-07-12 LAB — T4 AND TSH
T4, Total: 9.6 ug/dL (ref 4.5–12.0)
TSH: 0.011 u[IU]/mL — ABNORMAL LOW (ref 0.450–4.500)

## 2015-07-13 ENCOUNTER — Ambulatory Visit
Admission: EM | Admit: 2015-07-13 | Discharge: 2015-07-13 | Disposition: A | Discharge status: Home / Self Care | Payer: BLUE CROSS/BLUE SHIELD | Attending: Family Medicine | Admitting: Family Medicine

## 2015-07-13 ENCOUNTER — Other Ambulatory Visit: Payer: Self-pay | Admitting: Internal Medicine

## 2015-07-13 ENCOUNTER — Encounter: Payer: Self-pay | Admitting: Internal Medicine

## 2015-07-13 DIAGNOSIS — Z7982 Long term (current) use of aspirin: Secondary | ICD-10-CM | POA: Insufficient documentation

## 2015-07-13 DIAGNOSIS — Z87891 Personal history of nicotine dependence: Secondary | ICD-10-CM | POA: Insufficient documentation

## 2015-07-13 DIAGNOSIS — R109 Unspecified abdominal pain: Secondary | ICD-10-CM

## 2015-07-13 DIAGNOSIS — E785 Hyperlipidemia, unspecified: Secondary | ICD-10-CM | POA: Diagnosis not present

## 2015-07-13 DIAGNOSIS — R1012 Left upper quadrant pain: Secondary | ICD-10-CM | POA: Diagnosis not present

## 2015-07-13 DIAGNOSIS — E039 Hypothyroidism, unspecified: Secondary | ICD-10-CM | POA: Diagnosis not present

## 2015-07-13 DIAGNOSIS — I739 Peripheral vascular disease, unspecified: Secondary | ICD-10-CM | POA: Diagnosis not present

## 2015-07-13 DIAGNOSIS — E079 Disorder of thyroid, unspecified: Secondary | ICD-10-CM

## 2015-07-13 LAB — URINALYSIS COMPLETE WITH MICROSCOPIC (ARMC ONLY)
BACTERIA UA: NONE SEEN — AB
BILIRUBIN URINE: NEGATIVE
Glucose, UA: NEGATIVE mg/dL
Hgb urine dipstick: NEGATIVE
Ketones, ur: NEGATIVE mg/dL
LEUKOCYTES UA: NEGATIVE
NITRITE: NEGATIVE
Protein, ur: NEGATIVE mg/dL
SPECIFIC GRAVITY, URINE: 1.01 (ref 1.005–1.030)
WBC UA: NONE SEEN WBC/hpf (ref <3)
pH: 7 (ref 5.0–8.0)

## 2015-07-13 MED ORDER — LEVOTHYROXINE SODIUM 150 MCG PO TABS: 150.0000 ug | ORAL_TABLET | Freq: Every day | ORAL | Status: DC

## 2015-07-13 MED ORDER — KETOROLAC TROMETHAMINE 60 MG/2ML IM SOLN
60.0000 mg | Freq: Once | INTRAMUSCULAR | Status: AC
  Administered 2015-07-13: 60 mg via INTRAMUSCULAR

## 2015-07-13 MED ORDER — KETOROLAC TROMETHAMINE 10 MG PO TABS: 10.0000 mg | ORAL_TABLET | Freq: Three times a day (TID) | ORAL | Status: DC | PRN

## 2015-07-13 NOTE — Assessment & Plan Note (Signed)
Tsh was more suppressed.  Will reduce dose an drecheck in 6 weeks. Goal is mildly suppressed TSH

## 2015-07-13 NOTE — Discharge Instructions (Signed)
Flank Pain °Flank pain refers to pain that is located on the side of the body between the upper abdomen and the back. The pain may occur over a short period of time (acute) or may be long-term or reoccurring (chronic). It may be mild or severe. Flank pain can be caused by many things. °CAUSES  °Some of the more common causes of flank pain include: °· Muscle strains.   °· Muscle spasms.   °· A disease of your spine (vertebral disk disease).   °· A lung infection (pneumonia).   °· Fluid around your lungs (pulmonary edema).   °· A kidney infection.   °· Kidney stones.   °· A very painful skin rash caused by the chickenpox virus (shingles).   °· Gallbladder disease.   °HOME CARE INSTRUCTIONS  °Home care will depend on the cause of your pain. In general, °· Rest as directed by your caregiver. °· Drink enough fluids to keep your urine clear or pale yellow. °· Only take over-the-counter or prescription medicines as directed by your caregiver. Some medicines may help relieve the pain. °· Tell your caregiver about any changes in your pain. °· Follow up with your caregiver as directed. °SEEK IMMEDIATE MEDICAL CARE IF:  °· Your pain is not controlled with medicine.   °· You have new or worsening symptoms. °· Your pain increases.   °· You have abdominal pain.   °· You have shortness of breath.   °· You have persistent nausea or vomiting.   °· You have swelling in your abdomen.   °· You feel faint or pass out.   °· You have blood in your urine. °· You have a fever or persistent symptoms for more than 2-3 days. °· You have a fever and your symptoms suddenly get worse. °MAKE SURE YOU:  °· Understand these instructions. °· Will watch your condition. °· Will get help right away if you are not doing well or get worse. °Document Released: 01/03/2006 Document Revised: 08/06/2012 Document Reviewed: 06/26/2012 °[employer]® Patient Information ©2015 [employer]. This information is not intended to replace advice given to you by your  health care provider. Make sure you discuss any questions you have with your health care provider. ° °

## 2015-07-13 NOTE — ED Provider Notes (Signed)
CSN: [id number]     Arrival date & time 07/13/15  0800 History   None    No chief complaint on file.  (Consider location/radiation/quality/duration/timing/severity/associated sxs/prior Treatment) HPI   This a 59 year old female who presents with left flank pain radiating to her abdomen awoke her 3 AM yesterday and has wax and waned since that time. Last night it approximate 6:30 the pain became very intense graded 9/10 continued throughout the night keeping her awake most of the evening. He denies any dysuria no vaginal discharge no fever or chills. She has had some nausea. She has a history of stent placement in the distal aorta at the iliac bifurcation which was reassessed yesterday by ultrasound and preliminarily told that everything was okay. She has had some history of kidney stones in the past the last one being 59 years of age. Both her parents have had kidney stone problems  Past Medical History  Diagnosis Date  . Hypocalcemia   . Migraine     improved since thyroid surgery, 2 years  . Thyroid cancer 2007    s/p thyroidectomy  . Hyperlipidemia   . hypothyroidism 2007    acquired, secondary to surgery for thyroid Ca  . Carotid artery occlusion   . 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 Repeat angiography in November of 2014 showed 50% distal aortic stenosis extending into the left common iliac artery which had 60-70% ostial stenosis. No other significant disease. I placed on balloon expandable stent into  left common iliac artery extending slightly into the distal aorta   Past Surgical History  Procedure Laterality Date  . Thyroid surgery  2007    secondary to thyroid cancer  . Angioplasty illiac artery    . Cesarean section    . Nose surgery    . Skin cancer excision    . Abdominal aortagram N/A 10/14/2013    Procedure: ABDOMINAL [name];  Surgeon: Wellington Hampshire, MD;   Location: [facility] CATH LAB;  Service: Cardiovascular;  Laterality: N/A;   Family History  Problem Relation Age of Onset  . Coronary artery disease Maternal Grandmother   . Heart disease Maternal Grandmother 70  . Coronary artery disease Paternal Grandmother 26  . Heart disease Paternal Grandmother 41  . Alzheimer's disease Father   . Stroke Mother    Social History  Substance Use Topics  . Smoking status: Former Smoker -- 1.00 packs/day for 20 years    Types: Cigarettes    Quit date: 10/28/2011  . Smokeless tobacco: Never Used  . Alcohol Use: No   OB History    No data available     Review of Systems  Constitutional: Negative for fever, chills and fatigue.  Gastrointestinal: Positive for nausea and abdominal pain.  Genitourinary: Positive for flank pain.  Musculoskeletal: Positive for back pain.  All other systems reviewed and are negative.   Allergies  Atorvastatin; Darvocet; Demerol; Methocarbamol; and Ambien  Home Medications   Prior to Admission medications   Medication Sig Start Date End Date Taking? Authorizing Provider  ALPRAZolam Duanne Moron) 0.5 MG tablet Take 1 tablet (0.5 mg total) by mouth at bedtime as needed for anxiety. 06/30/15   [name], MD  aspirin 81 MG tablet Take 1 tablet (81 mg total) by mouth daily. 07/28/13   Wellington Hampshire, MD  benzonatate (TESSALON) 200 MG capsule Take 1 capsule (200 mg total) by mouth 3 (three)  times daily as needed for cough. 07/12/14   [name], MD  butorphanol (STADOL) 10 MG/ML nasal spray Place 1 spray into the nose Once PRN. 01/31/15   [name], MD  calcium carbonate (OS-CAL) 600 MG TABS Take 1,200 mg by mouth daily with breakfast.     Historical Provider, MD  Cholecalciferol (VITAMIN D) 2000 UNITS tablet Take 2,000 Units by mouth daily.    Historical Provider, MD  clopidogrel (PLAVIX) 75 MG tablet Take 1 tablet (75 mg total) by mouth once. ONE EVERY 5 DAYS 08/03/14   Wellington Hampshire, MD  cyclobenzaprine (FLEXERIL) 10 MG  tablet Take 1 tablet (10 mg total) by mouth 3 (three) times daily as needed for muscle spasms. 01/31/15   [name], MD  diclofenac (VOLTAREN) 75 MG EC tablet Take 1 tablet (75 mg total) by mouth 2 (two) times daily. 08/30/14   [name], MD  ketorolac (TORADOL) 10 MG tablet Take 1 tablet (10 mg total) by mouth every 8 (eight) hours as needed. 07/13/15   Lorin Picket, PA-C  levothyroxine (SYNTHROID, LEVOTHROID) 175 MCG tablet Take 1 tablet (175 mcg total) by mouth daily. 02/01/15   [name], MD  Multiple Vitamin (MULTIVITAMIN) tablet Take 1 tablet by mouth daily.      Historical Provider, MD  rizatriptan (MAXALT-MLT) 10 MG disintegrating tablet Take 1 tablet (10 mg total) by mouth as needed. May repeat in 2 hours if needed 01/11/14   [name], MD  rosuvastatin (CRESTOR) 20 MG tablet Take 1 tablet (20 mg total) by mouth daily. 01/31/15   [name], MD  zoster vaccine live, PF, (ZOSTAVAX) 66063 UNT/0.65ML injection Inject 19,400 Units into the skin once. 07/12/14   [name], MD   There were no vitals taken for this visit. Physical Exam  Constitutional: She is oriented to person, place, and time. She appears well-developed and well-nourished.  HENT:  Head: Normocephalic and atraumatic.  Eyes: Pupils are equal, round, and reactive to light.  Neck: Neck supple.  Pulmonary/Chest: Effort normal and breath sounds normal. No respiratory distress. She has no wheezes. She has no rales. She exhibits no tenderness.  Abdominal: Bowel sounds are normal. She exhibits no distension and no mass. There is tenderness. There is no rebound and no guarding.  Vision the abdomen shows no distention. Sounds are normal. His mild tenderness to palpation along the left lower quadrant and the suprapubic region but has no guarding or rebound present. There is no CVA tenderness present.  Neurological: She is alert and oriented to person, place, and time.  Skin: Skin is warm and dry.  Psychiatric: She  has a normal mood and affect. Her behavior is normal. Judgment and thought content normal.  Nursing note and vitals reviewed.   ED Course  Procedures (including critical care time) Labs Review Labs Reviewed  URINALYSIS COMPLETEWITH MICROSCOPIC ([facility] ONLY) - Abnormal; Notable for the following:    Color, Urine STRAW (*)    Bacteria, UA NONE SEEN (*)    Squamous Epithelial / LPF 0-5 (*)    All other components within normal limits  URINE CULTURE    Imaging Review No results found.   MDM   1. Acute left flank pain    Discharge Medication List as of 07/13/2015 10:16 AM    START taking these medications   Details  ketorolac (TORADOL) 10 MG tablet Take 1 tablet (10 mg total) by mouth every 8 (eight) hours as needed.,  Starting 07/13/2015, Until Discontinued, Print      Plan: 1. Test/x-ray results and diagnosis reviewed with patient 2. rx as per orders; risks, benefits, potential side effects reviewed with patient 3. Recommend supportive treatment with fluids,rest. 4. F/u prn if symptoms worsen or don't improve   I've had a long discussion with the patient regarding her findings and my examination. A this point time her pain is much less than it was last night currently at a 3/10 down from a 9/10 during the evening. Her pain is described as being on her left flank around to her upper abdomen and has a pulsing type of sensation . She has no other symptoms and specifically denies any chills or any diarrhea no vaginal discharge no dysuria no fever. She appears comfortable during the entire examination. She has no pain on palpation in the area and has full comfortable thoracic rotation. Abdominal exam was rather benign with mild tenderness in areas removed from her current pain. I recommended that with a normal urine no fever and pain that has markedly decreased from last night that we wait and watch to see how things progress over the next 24 hours. CT scan may be beneficial if she does not  improve and if she returns tomorrow with increased discomfort one may be ordered. She is agreeable to this and not undergoing a large amount of radiation for small possible gain. If she has more pain tonight she should go to emergency department. I gave her an injection of Toradol and a prescription of Toradol to help with her pain management at home. If she is not improved she will return here tomorrow.  Lorin Picket, PA-C 07/13/15 1028

## 2015-07-14 LAB — URINE CULTURE

## 2015-07-16 ENCOUNTER — Ambulatory Visit
Admission: EM | Admit: 2015-07-16 | Discharge: 2015-07-16 | Disposition: A | Discharge status: Home / Self Care | Payer: BLUE CROSS/BLUE SHIELD | Attending: Family Medicine | Admitting: Family Medicine

## 2015-07-16 ENCOUNTER — Ambulatory Visit: Payer: BLUE CROSS/BLUE SHIELD

## 2015-07-16 DIAGNOSIS — R109 Unspecified abdominal pain: Secondary | ICD-10-CM | POA: Insufficient documentation

## 2015-07-16 LAB — URINALYSIS COMPLETE WITH MICROSCOPIC (ARMC ONLY)
Bacteria, UA: NONE SEEN — AB
Bilirubin Urine: NEGATIVE
Glucose, UA: NEGATIVE mg/dL
Hgb urine dipstick: NEGATIVE
Ketones, ur: NEGATIVE mg/dL
Leukocytes, UA: NEGATIVE
Nitrite: NEGATIVE
Protein, ur: NEGATIVE mg/dL
RBC / HPF: NONE SEEN RBC/hpf (ref <3)
Specific Gravity, Urine: 1.015 (ref 1.005–1.030)
pH: 6 (ref 5.0–8.0)

## 2015-07-16 MED ORDER — NAPROXEN 500 MG PO TABS: 500.0000 mg | ORAL_TABLET | Freq: Two times a day (BID) | ORAL | Status: DC

## 2015-07-16 NOTE — ED Notes (Signed)
Patient seen here on wed for back pain, not sure if she has kidney stone. Pt still having pain.

## 2015-07-16 NOTE — ED Provider Notes (Signed)
CSN: [id number]     Arrival date & time 07/16/15  1145 History   First MD Initiated Contact with Patient 07/16/15 1342     Chief Complaint  Patient presents with  . Back Pain   (Consider location/radiation/quality/duration/timing/severity/associated sxs/prior Treatment) HPI   This 59 year old female returns today after being seen on Wednesday for left-sided flank pain. She states that the Toradol helped her a great deal but when it wore off her pain returned. Next morning she woke felt good enough to go to work and finished the whole day without consequence. The pain has started to come back waxing and waning as usual but still remaining in the same area but today she has noticed some radiation into her left lower quadrant towards the pelvis She states she woke this morning and within 15 minutes had started having her pain again. She is concerned that she may have a kidney stone or pain has not changed significantly since her previous exam. She has no nausea or vomiting. Is not febrile. Currently since arriving here the pain has subsided. She still states that she has had no injury to her back and does not seem to be musculoskeletal in origin.  Past Medical History  Diagnosis Date  . Hypocalcemia   . Migraine     improved since thyroid surgery, 2 years  . Thyroid cancer 2007    s/p thyroidectomy  . Hyperlipidemia   . hypothyroidism 2007    acquired, secondary to surgery for thyroid Ca  . Carotid artery occlusion   . 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 Repeat angiography in November of 2014 showed 50% distal aortic stenosis extending into the left common iliac artery which had 60-70% ostial stenosis. No other significant disease. I placed on balloon expandable stent into  left common iliac artery extending slightly into the distal aorta   Past Surgical History  Procedure Laterality  Date  . Thyroid surgery  2007    secondary to thyroid cancer  . Angioplasty illiac artery    . Cesarean section    . Nose surgery    . Skin cancer excision    . Abdominal aortagram N/A 10/14/2013    Procedure: ABDOMINAL [name];  Surgeon: Wellington Hampshire, MD;  Location: [facility] CATH LAB;  Service: Cardiovascular;  Laterality: N/A;   Family History  Problem Relation Age of Onset  . Coronary artery disease Maternal Grandmother   . Heart disease Maternal Grandmother 70  . Coronary artery disease Paternal Grandmother 47  . Heart disease Paternal Grandmother 29  . Alzheimer's disease Father   . Stroke Mother    Social History  Substance Use Topics  . Smoking status: Former Smoker -- 1.00 packs/day for 20 years    Types: Cigarettes    Quit date: 10/28/2011  . Smokeless tobacco: Never Used  . Alcohol Use: No   OB History    No data available     Review of Systems  Constitutional: Negative for chills and fatigue.  Gastrointestinal: Positive for abdominal pain.  All other systems reviewed and are negative.   Allergies  Atorvastatin; Darvocet; Demerol; Methocarbamol; and Ambien  Home Medications   Prior to Admission medications   Medication Sig Start Date End Date Taking? Authorizing Provider  ALPRAZolam Duanne Moron) 0.5 MG tablet Take 1 tablet (0.5 mg total) by mouth at bedtime as needed for anxiety. 06/30/15   [name], MD  aspirin 81 MG tablet Take 1 tablet (81 mg total) by mouth daily. 07/28/13   Wellington Hampshire, MD  benzonatate (TESSALON) 200 MG capsule Take 1 capsule (200 mg total) by mouth 3 (three) times daily as needed for cough. 07/12/14   [name], MD  butorphanol (STADOL) 10 MG/ML nasal spray Place 1 spray into the nose Once PRN. 01/31/15   [name], MD  calcium carbonate (OS-CAL) 600 MG TABS Take 1,200 mg by mouth daily with breakfast.     Historical Provider, MD  Cholecalciferol (VITAMIN D) 2000 UNITS tablet Take 2,000 Units by mouth daily.    Historical  Provider, MD  clopidogrel (PLAVIX) 75 MG tablet Take 1 tablet (75 mg total) by mouth once. ONE EVERY 5 DAYS 08/03/14   Wellington Hampshire, MD  cyclobenzaprine (FLEXERIL) 10 MG tablet Take 1 tablet (10 mg total) by mouth 3 (three) times daily as needed for muscle spasms. 01/31/15   [name], MD  diclofenac (VOLTAREN) 75 MG EC tablet Take 1 tablet (75 mg total) by mouth 2 (two) times daily. 08/30/14   [name], MD  ketorolac (TORADOL) 10 MG tablet Take 1 tablet (10 mg total) by mouth every 8 (eight) hours as needed. 07/13/15   Lorin Picket, PA-C  levothyroxine (SYNTHROID, LEVOTHROID) 150 MCG tablet Take 1 tablet (150 mcg total) by mouth daily. 07/13/15   [name], MD  Multiple Vitamin (MULTIVITAMIN) tablet Take 1 tablet by mouth daily.      Historical Provider, MD  naproxen (NAPROSYN) 500 MG tablet Take 1 tablet (500 mg total) by mouth 2 (two) times daily with a meal. 07/16/15   Lorin Picket, PA-C  rizatriptan (MAXALT-MLT) 10 MG disintegrating tablet Take 1 tablet (10 mg total) by mouth as needed. May repeat in 2 hours if needed 01/11/14   [name], MD  rosuvastatin (CRESTOR) 20 MG tablet Take 1 tablet (20 mg total) by mouth daily. 01/31/15   [name], MD  zoster vaccine live, PF, (ZOSTAVAX) 73532 UNT/0.65ML injection Inject 19,400 Units into the skin once. 07/12/14   [name], MD   BP 133/84 mmHg  Pulse 72  Temp(Src) 98.1 F (36.7 C) (Oral)  Resp 20  Ht 5\' 7"  (1.702 m)  Wt 125 lb (56.7 kg)  BMI 19.57 kg/m2  SpO2 99% Physical Exam  Constitutional: She is oriented to person, place, and time. She appears well-developed and well-nourished.  HENT:  Head: Normocephalic and atraumatic.  Neck: Neck supple.  Abdominal: Soft. She exhibits no distension and no mass. There is tenderness. There is no rebound and no guarding.  Examination of the abdomen today shows slightly hypotonic bowel sounds present. She has mild tenderness to palpation just superior to the umbilicus in  the left upper quadrant. There is no CVA tenderness present. There is no pain in her lumbar or thoracic spine or the rib cage. There is no rebound no guarding  Neurological: She is alert and oriented to person, place, and time.  Skin: Skin is warm and dry.  Psychiatric: She has a normal mood and affect. Her behavior is normal. Judgment and thought content normal.  Nursing note and vitals reviewed.   ED Course  Procedures (including critical care time) Labs Review Labs Reviewed  URINALYSIS COMPLETEWITH MICROSCOPIC ([facility] ONLY) - Abnormal; Notable for the following:    Bacteria, UA NONE SEEN (*)    Squamous Epithelial / LPF 0-5 (*)    All other components  within normal limits  URINE CULTURE    Imaging Review Ct Abdomen Pelvis Wo Contrast  07/16/2015   CLINICAL DATA:  Patient with left lower flank and back pain for 4 days. History of renal stones. Prior C-section.  EXAM: CT ABDOMEN AND PELVIS WITHOUT CONTRAST  TECHNIQUE: Multidetector CT imaging of the abdomen and pelvis was performed following the standard protocol without IV contrast.  COMPARISON:  None.  FINDINGS: Lower chest: Dependent atelectasis within the left and right lower lobes. 5 mm right upper lobe pulmonary nodule (image 5; series 2). Normal heart size.  Hepatobiliary: Liver is normal in size and contour. Gallbladder is unremarkable. No intrahepatic or extrahepatic biliary ductal dilatation.  Pancreas: Unremarkable  Spleen: Unremarkable  Adrenals/Urinary Tract: Nodular thickening of the left adrenal gland measuring up to 9 mm with an internal density of -11 Hounsfield units, most compatible with adenoma. The right adrenal gland is unremarkable. Kidneys are symmetric in size. No hydronephrosis. No ureterolithiasis.  Stomach/Bowel: No abnormal bowel wall thickening or evidence for bowel obstruction. The appendix is normal. No free fluid or free intraperitoneal air.  Vascular/Lymphatic: The abdominal aorta is normal in caliber. Stent  graft is demonstrated within the proximal left common iliac artery.  Other: Uterus and adnexal structures are unremarkable.  Musculoskeletal: No aggressive or acute appearing osseous lesions.  IMPRESSION: No nephroureterolithiasis.  No hydronephrosis.  Normal appendix.  No acute process within the abdomen or pelvis.  5 mm right upper lobe pulmonary nodule. If the patient is at high risk for bronchogenic carcinoma, follow-up chest CT at 6-12 months is recommended. If the patient is at low risk for bronchogenic carcinoma, follow-up chest CT at 12 months is recommended. This recommendation follows the consensus statement: Guidelines for Management of Small Pulmonary Nodules Detected on CT Scans: A Statement from the [employer] as published in Radiology 2005;237:395-400.   Electronically Signed   By: Lovey Newcomer M.D.   On: 07/16/2015 15:08     MDM   1. Flank pain    Discharge Medication List as of 07/16/2015  3:38 PM    START taking these medications   Details  naproxen (NAPROSYN) 500 MG tablet Take 1 tablet (500 mg total) by mouth 2 (two) times daily with a meal., Starting 07/16/2015, Until Discontinued, Print      Plan: 1. Test/x-ray results and diagnosis reviewed with patient 2. rx as per orders; risks, benefits, potential side effects reviewed with patient 3. Recommend supportive treatment with NSAIDS ( use only naprosyn no other NSAIDS in conjunction. 4. F/u with PCP next week. I reviewed the CT scan with the patient told her that there was no evidence of renal stones identified necrosis. He did have an adrenal adenoma but its small size and density and most likely  benign. Also pointed out the 5 mm nodule in her right upper lobe which show will need to be followed I'll leave this up to her primary care which she is seeing next week. I reassured her that is nothing on the CT scan that is alarming today. We'll continue to recommend her taking just nostril anti-inflammatory drugs for her  pain watch the course of her pain until she is seen by her primary care.  Lorin Picket, PA-C 07/16/15 [phone]

## 2015-07-18 LAB — URINE CULTURE

## 2015-07-21 ENCOUNTER — Encounter: Payer: Self-pay | Admitting: Internal Medicine

## 2015-07-21 ENCOUNTER — Ambulatory Visit (INDEPENDENT_AMBULATORY_CARE_PROVIDER_SITE_OTHER): Payer: BLUE CROSS/BLUE SHIELD | Admitting: Internal Medicine

## 2015-07-21 VITALS — BP 144/90 | HR 82 | Temp 98.3°F | Resp 12 | Ht 67.0 in | Wt 124.0 lb

## 2015-07-21 DIAGNOSIS — Z87891 Personal history of nicotine dependence: Secondary | ICD-10-CM

## 2015-07-21 DIAGNOSIS — E079 Disorder of thyroid, unspecified: Secondary | ICD-10-CM

## 2015-07-21 DIAGNOSIS — D3502 Benign neoplasm of left adrenal gland: Secondary | ICD-10-CM | POA: Diagnosis not present

## 2015-07-21 DIAGNOSIS — E278 Other specified disorders of adrenal gland: Secondary | ICD-10-CM

## 2015-07-21 DIAGNOSIS — E279 Disorder of adrenal gland, unspecified: Secondary | ICD-10-CM

## 2015-07-21 DIAGNOSIS — Z85850 Personal history of malignant neoplasm of thyroid: Secondary | ICD-10-CM

## 2015-07-21 DIAGNOSIS — R911 Solitary pulmonary nodule: Secondary | ICD-10-CM | POA: Diagnosis not present

## 2015-07-21 DIAGNOSIS — E038 Other specified hypothyroidism: Secondary | ICD-10-CM

## 2015-07-21 DIAGNOSIS — E032 Hypothyroidism due to medicaments and other exogenous substances: Secondary | ICD-10-CM | POA: Diagnosis not present

## 2015-07-21 DIAGNOSIS — E034 Atrophy of thyroid (acquired): Secondary | ICD-10-CM

## 2015-07-21 NOTE — Progress Notes (Signed)
Subjective:  Patient ID: Tracie Morris, female    DOB: Dec 28, 1955  Age: 59 y.o. MRN: [id number]  CC: The primary encounter diagnosis was Solitary pulmonary nodule. Diagnoses of Adrenal adenoma, left, Hypothyroidism due to acquired atrophy of thyroid, Iatrogenic hypothyroidism, hypothyroidism, History of thyroid cancer, History of tobacco abuse, and Adrenal mass, left were also pertinent to this visit.  HPI Tracie Morris presents for follow up on multiple issues including acquired hypothyroidism and recent abnormal CT of chest and abdomen,    Incidental findings of  5 mm RUL pulmonary nodule and left sided adrenal mass during ER workup for left sided  flank and back pain. Ct was done due to history of renal calculi,  But  CT abd and pelvis  Was negative for stones and  hydronephrosis,  But noted  The findings as stated above .  History of 20 pk yrs of tobacco abuse,  Quit in 2012. History of thyroid cancer,  S/p total thyroidectomy in 2007.    Sine her last visit she has had a weight loss of 11 lbs,  unintentional. Thyroid was very overactive in mid august and dose was reduced,   Not eating lunch due to workplace environment. And reports lack of  Appetite. Has been on 150 mcg daily since August 18,  Next due end of september  Outpatient Prescriptions Prior to Visit  Medication Sig Dispense Refill  . ALPRAZolam (XANAX) 0.5 MG tablet Take 1 tablet (0.5 mg total) by mouth at bedtime as needed for anxiety. 30 tablet 3  . aspirin 81 MG tablet Take 1 tablet (81 mg total) by mouth daily. 30 tablet   . calcium carbonate (OS-CAL) 600 MG TABS Take 1,200 mg by mouth daily with breakfast.     . Cholecalciferol (VITAMIN D) 2000 UNITS tablet Take 2,000 Units by mouth daily.    . clopidogrel (PLAVIX) 75 MG tablet Take 1 tablet (75 mg total) by mouth once. ONE EVERY 5 DAYS 30 tablet 6  . levothyroxine (SYNTHROID, LEVOTHROID) 150 MCG tablet Take 1 tablet (150 mcg total) by mouth daily. 90 tablet 0  .  Multiple Vitamin (MULTIVITAMIN) tablet Take 1 tablet by mouth daily.      . naproxen (NAPROSYN) 500 MG tablet Take 1 tablet (500 mg total) by mouth 2 (two) times daily with a meal. 60 tablet 0  . rizatriptan (MAXALT-MLT) 10 MG disintegrating tablet Take 1 tablet (10 mg total) by mouth as needed. May repeat in 2 hours if needed 10 tablet 3  . rosuvastatin (CRESTOR) 20 MG tablet Take 1 tablet (20 mg total) by mouth daily. 90 tablet 3  . zoster vaccine live, PF, (ZOSTAVAX) 01751 UNT/0.65ML injection Inject 19,400 Units into the skin once. (Patient not taking: Reported on 07/21/2015) 1 each 0  . benzonatate (TESSALON) 200 MG capsule Take 1 capsule (200 mg total) by mouth 3 (three) times daily as needed for cough. (Patient not taking: Reported on 07/21/2015) 60 capsule 0  . butorphanol (STADOL) 10 MG/ML nasal spray Place 1 spray into the nose Once PRN. (Patient not taking: Reported on 07/21/2015) 2.5 mL 2  . cyclobenzaprine (FLEXERIL) 10 MG tablet Take 1 tablet (10 mg total) by mouth 3 (three) times daily as needed for muscle spasms. (Patient not taking: Reported on 07/21/2015) 30 tablet 0  . diclofenac (VOLTAREN) 75 MG EC tablet Take 1 tablet (75 mg total) by mouth 2 (two) times daily. (Patient not taking: Reported on 07/21/2015) 60 tablet 2  . ketorolac (TORADOL) 10  MG tablet Take 1 tablet (10 mg total) by mouth every 8 (eight) hours as needed. (Patient not taking: Reported on 07/21/2015) 15 tablet 0   No facility-administered medications prior to visit.    Review of Systems;  Patient denies headache, fevers, malaise, unintentional weight loss, skin rash, eye pain, sinus congestion and sinus pain, sore throat, dysphagia,  hemoptysis , cough, dyspnea, wheezing, chest pain, palpitations, orthopnea, edema, abdominal pain, nausea, melena, diarrhea, constipation, flank pain, dysuria, hematuria, urinary  Frequency, nocturia, numbness, tingling, seizures,  Focal weakness, Loss of consciousness,  Tremor, insomnia,  depression, anxiety, and suicidal ideation.      Objective:  BP 144/90 mmHg  Pulse 82  Temp(Src) 98.3 F (36.8 C) (Oral)  Resp 12  Ht 5\' 7"  (1.702 m)  Wt 124 lb (56.246 kg)  BMI 19.42 kg/m2  SpO2 98%  BP Readings from Last 3 Encounters:  07/21/15 144/90  07/16/15 133/84  01/31/15 138/70    Wt Readings from Last 3 Encounters:  07/21/15 124 lb (56.246 kg)  07/16/15 125 lb (56.7 kg)  01/31/15 131 lb 8 oz (59.648 kg)    General appearance: alert, cooperative and appears stated age Ears: normal TM's and external ear canals both ears Throat: lips, mucosa, and tongue normal; teeth and gums normal Neck: no adenopathy, no carotid bruit, supple, symmetrical, trachea midline and thyroid not enlarged, symmetric, no tenderness/mass/nodules Back: symmetric, no curvature. ROM normal. No CVA tenderness. Lungs: clear to auscultation bilaterally Heart: regular rate and rhythm, S1, S2 normal, no murmur, click, rub or gallop Abdomen: soft, non-tender; bowel sounds normal; no masses,  no organomegaly Pulses: 2+ and symmetric Skin: Skin color, texture, turgor normal. No rashes or lesions Lymph nodes: Cervical, supraclavicular, and axillary nodes normal.  No results found for: HGBA1C  Lab Results  Component Value Date   CREATININE 0.97 02/17/2015   CREATININE 0.8 08/30/2014   CREATININE 0.9 07/12/2014    Lab Results  Component Value Date   WBC 9.7 02/17/2015   HGB 15.7 02/17/2015   HCT 45.4 02/17/2015   PLT 207 02/17/2015   GLUCOSE 224* 02/17/2015   CHOL 145 07/11/2015   TRIG 103.0 07/11/2015   HDL 39.50 07/11/2015   LDLCALC 85 07/11/2015   ALT 13 08/30/2014   AST 16 08/30/2014   NA 137 02/17/2015   K 3.7 02/17/2015   CL 105 02/17/2015   CREATININE 0.97 02/17/2015   BUN 20 02/17/2015   CO2 23 02/17/2015   TSH 0.011* 07/11/2015   INR 0.96 10/14/2013   MICROALBUR 0.6 01/11/2014    Ct Abdomen Pelvis Wo Contrast  07/16/2015   CLINICAL DATA:  Patient with left lower flank  and back pain for 4 days. History of renal stones. Prior C-section.  EXAM: CT ABDOMEN AND PELVIS WITHOUT CONTRAST  TECHNIQUE: Multidetector CT imaging of the abdomen and pelvis was performed following the standard protocol without IV contrast.  COMPARISON:  None.  FINDINGS: Lower chest: Dependent atelectasis within the left and right lower lobes. 5 mm right upper lobe pulmonary nodule (image 5; series 2). Normal heart size.  Hepatobiliary: Liver is normal in size and contour. Gallbladder is unremarkable. No intrahepatic or extrahepatic biliary ductal dilatation.  Pancreas: Unremarkable  Spleen: Unremarkable  Adrenals/Urinary Tract: Nodular thickening of the left adrenal gland measuring up to 9 mm with an internal density of -11 Hounsfield units, most compatible with adenoma. The right adrenal gland is unremarkable. Kidneys are symmetric in size. No hydronephrosis. No ureterolithiasis.  Stomach/Bowel: No abnormal bowel wall thickening  or evidence for bowel obstruction. The appendix is normal. No free fluid or free intraperitoneal air.  Vascular/Lymphatic: The abdominal aorta is normal in caliber. Stent graft is demonstrated within the proximal left common iliac artery.  Other: Uterus and adnexal structures are unremarkable.  Musculoskeletal: No aggressive or acute appearing osseous lesions.  IMPRESSION: No nephroureterolithiasis.  No hydronephrosis.  Normal appendix.  No acute process within the abdomen or pelvis.  5 mm right upper lobe pulmonary nodule. If the patient is at high risk for bronchogenic carcinoma, follow-up chest CT at 6-12 months is recommended. If the patient is at low risk for bronchogenic carcinoma, follow-up chest CT at 12 months is recommended. This recommendation follows the consensus statement: Guidelines for Management of Small Pulmonary Nodules Detected on CT Scans: A Statement from the [place] as published in Radiology 2005;237:395-400.   Electronically Signed   By: Lovey Newcomer  M.D.   On: 07/16/2015 15:08    Assessment & Plan:   Problem List Items Addressed This Visit      Unprioritized   Iatrogenic hypothyroidism    Her TSH is suppressed but she has lost weight.  Dose has been reduced to 150 mcg daily.  and goal is TSH < 0.2 to 0.5   Lab Results  Component Value Date   TSH 0.011* 07/11/2015         Relevant Orders   Ambulatory referral to Endocrinology   Solitary pulmonary nodule - Primary    PET scan ordered,  Given history of tobacco abuse,  Thyroid ca,  Adrenal mass.  If PET scan is positive she will request referral to [facility] in October.       Relevant Orders   NM PET Image Initial (PI) Skull Base To Thigh   Adrenal mass, left    Likely an adenoma, but given her history of thyroid ca and Ct showing a pulmonary nodule,  Further workup discussed.  PET scan and Endocrine consult ordered.  Patient has been reading about adrenal tumors and  is  overly concerned about pheochromocytoma (no symptoms) and would like workup for adrenal tumor.         Other Visit Diagnoses    Hypothyroidism due to acquired atrophy of thyroid        History of thyroid cancer        Relevant Orders    NM PET Image Initial (PI) Skull Base To Thigh    History of tobacco abuse        Relevant Orders    NM PET Image Initial (PI) Skull Base To Thigh      A total of 40 minutes of face to face time was spent with patient more than half of which was spent in reviewing the CT scan , counselling about the above mentioned conditions  and coordination of care   I have discontinued Ms. Wiersma's benzonatate, diclofenac, butorphanol, cyclobenzaprine, and ketorolac. I am also having her maintain her multivitamin, calcium carbonate, Vitamin D, aspirin, rizatriptan, zoster vaccine live (PF), clopidogrel, rosuvastatin, ALPRAZolam, levothyroxine, naproxen, and zolpidem.  Meds ordered this encounter  Medications  . zolpidem (AMBIEN) 10 MG tablet    Sig: Take 10 mg by mouth at  bedtime as needed.     Refill:  2    Medications Discontinued During This Encounter  Medication Reason  . butorphanol (STADOL) 10 MG/ML nasal spray   . benzonatate (TESSALON) 200 MG capsule   . ketorolac (TORADOL) 10 MG tablet   . diclofenac (VOLTAREN)  75 MG EC tablet   . cyclobenzaprine (FLEXERIL) 10 MG tablet     Follow-up: No Follow-up on file.   [name], MD

## 2015-07-21 NOTE — Assessment & Plan Note (Signed)
PET scan ordered,  Given history of tobacco abuse,  Thyroid ca,  Adrenal mass.  If PET scan is positive she will request referral to [facility] in October.

## 2015-07-21 NOTE — Progress Notes (Signed)
Pre-visit discussion using our clinic review tool. No additional management support is needed unless otherwise documented below in the visit note.  

## 2015-07-21 NOTE — Patient Instructions (Signed)
We will repeat your thyroid function at the end of September, sooner if Dr [name] wants it done  Referral for PET scan and for Endicrinology evaluation (Dr Keitha Butte)

## 2015-07-21 NOTE — Assessment & Plan Note (Signed)
Likely an adenoma, but given her history of thyroid ca and Ct showing a pulmonary nodule,  Further workup discussed.  PET scan and Endocrine consult ordered.  Patient has been reading about adrenal tumors and  is  overly concerned about pheochromocytoma (no symptoms) and would like workup for adrenal tumor.

## 2015-07-21 NOTE — Assessment & Plan Note (Signed)
Her TSH is suppressed but she has lost weight.  Dose has been reduced to 150 mcg daily.  and goal is TSH < 0.2 to 0.5   Lab Results  Component Value Date   TSH 0.011* 07/11/2015

## 2015-07-27 ENCOUNTER — Ambulatory Visit: Payer: BLUE CROSS/BLUE SHIELD

## 2015-07-30 ENCOUNTER — Other Ambulatory Visit: Payer: Self-pay | Admitting: Internal Medicine

## 2015-08-02 ENCOUNTER — Encounter: Payer: Self-pay | Admitting: Internal Medicine

## 2015-08-02 ENCOUNTER — Ambulatory Visit
Admission: RE | Admit: 2015-08-02 | Discharge: 2015-08-02 | Disposition: A | Discharge status: Home / Self Care | Payer: BLUE CROSS/BLUE SHIELD | Source: Ambulatory Visit | Attending: Internal Medicine | Admitting: Internal Medicine

## 2015-08-02 DIAGNOSIS — R911 Solitary pulmonary nodule: Secondary | ICD-10-CM

## 2015-08-02 DIAGNOSIS — Z87891 Personal history of nicotine dependence: Secondary | ICD-10-CM

## 2015-08-02 DIAGNOSIS — J439 Emphysema, unspecified: Secondary | ICD-10-CM | POA: Diagnosis not present

## 2015-08-02 DIAGNOSIS — Z85850 Personal history of malignant neoplasm of thyroid: Secondary | ICD-10-CM

## 2015-08-02 DIAGNOSIS — I700 Atherosclerosis of aorta: Secondary | ICD-10-CM | POA: Diagnosis not present

## 2015-08-02 DIAGNOSIS — D3502 Benign neoplasm of left adrenal gland: Secondary | ICD-10-CM

## 2015-08-02 LAB — GLUCOSE, CAPILLARY: GLUCOSE-CAPILLARY: 95 mg/dL (ref 65–99)

## 2015-08-02 MED ORDER — FLUDEOXYGLUCOSE F - 18 (FDG) INJECTION
12.5200 | Freq: Once | INTRAVENOUS | Status: DC | PRN
  Administered 2015-08-02: 12.52 via INTRAVENOUS
  Filled 2015-08-02: qty 12.52

## 2015-08-11 ENCOUNTER — Ambulatory Visit (INDEPENDENT_AMBULATORY_CARE_PROVIDER_SITE_OTHER): Payer: BLUE CROSS/BLUE SHIELD | Admitting: Cardiovascular Disease

## 2015-08-11 ENCOUNTER — Encounter: Payer: Self-pay | Admitting: Cardiovascular Disease

## 2015-08-11 VITALS — BP 130/80 | HR 79 | Ht 67.0 in | Wt 124.8 lb

## 2015-08-11 DIAGNOSIS — R0600 Dyspnea, unspecified: Secondary | ICD-10-CM | POA: Insufficient documentation

## 2015-08-11 DIAGNOSIS — I739 Peripheral vascular disease, unspecified: Secondary | ICD-10-CM | POA: Diagnosis not present

## 2015-08-11 DIAGNOSIS — R0789 Other chest pain: Secondary | ICD-10-CM

## 2015-08-11 DIAGNOSIS — R0602 Shortness of breath: Secondary | ICD-10-CM | POA: Diagnosis not present

## 2015-08-11 DIAGNOSIS — E785 Hyperlipidemia, unspecified: Secondary | ICD-10-CM

## 2015-08-11 NOTE — Patient Instructions (Signed)
Medication Instructions:  Your physician recommends that you continue on your current medications as directed. Please refer to the Current Medication list given to you today.   Labwork: none  Testing/Procedures: none  Follow-Up: Your physician wants you to follow-up in: 6 months. You will receive a reminder letter in the mail two months in advance. If you don't receive a letter, please call our office to schedule the follow-up appointment.   Any Other Special Instructions Will Be Listed Below (If Applicable). You have been referred to pulmonary for shortness of breath

## 2015-08-11 NOTE — Assessment & Plan Note (Signed)
Lab Results  Component Value Date   CHOL 145 07/11/2015   HDL 39.50 07/11/2015   LDLCALC 85 07/11/2015   TRIG 103.0 07/11/2015   CHOLHDL 4 07/11/2015   Continue treatment with rosuvastatin. LDL is close to target.

## 2015-08-11 NOTE — Assessment & Plan Note (Signed)
No recurrent claudication. Recent noninvasive vascular evaluation showed normal ABI with patent stent in the left common iliac artery. Duplex showed mildly elevated velocities. Recommend continuing medical therapy and follow-up duplex in 1 year. 

## 2015-08-11 NOTE — Assessment & Plan Note (Signed)
I suspect that this is likely due to underlying emphysema related to previous tobacco use and continued secondhand smoking. Less likely to be due to ischemic heart disease. She had a stress test done in 2014 which was normal. Current EKG is also unremarkable. I'm referring her to pulmonary for evaluation. Repeat ischemic cardiac evaluation can be considered if there is no other explanation for symptoms.

## 2015-08-11 NOTE — Progress Notes (Signed)
Primary care physician: Dr. Derrel Nip.   HPI  This is a pleasant 59 year old Caucasian female who is here today for a followup visit regarding peripheral arterial disease.  She is status post left common iliac artery stent placement in November of 2014 for significant claudication. Other Medical history include previous tobacco use and hyperlipidemia  No significant claudication. No chest pain. She was diagnosed recently with an adrenal adenoma with pulmonary nodule. PET scan was unremarkable. She complains of worsening dyspnea and fatigue. She is a previous smoker and continues to be exposed to secondhand smoking. Recent CT scan of the chest and abdomen didn't show evidence of emphysema. She also reports exposure to industrial dust recently during elevation.  Allergies  Allergen Reactions  . Atorvastatin Other (See Comments)    Myalgias   . Darvocet [Propoxyphene N-Acetaminophen] Nausea And Vomiting  . Demerol Nausea And Vomiting  . Methocarbamol Other (See Comments)  . Ambien [Zolpidem Tartrate] Palpitations    Anxiety , trembling.     Current Outpatient Prescriptions on File Prior to Visit  Medication Sig Dispense Refill  . ALPRAZolam (XANAX) 0.5 MG tablet Take 1 tablet (0.5 mg total) by mouth at bedtime as needed for anxiety. 30 tablet 3  . aspirin 81 MG tablet Take 1 tablet (81 mg total) by mouth daily. 30 tablet   . calcium carbonate (OS-CAL) 600 MG TABS Take 1,200 mg by mouth daily with breakfast.     . Cholecalciferol (VITAMIN D) 2000 UNITS tablet Take 2,000 Units by mouth daily.    . clopidogrel (PLAVIX) 75 MG tablet Take 1 tablet (75 mg total) by mouth once. ONE EVERY 5 DAYS 30 tablet 6  . levothyroxine (SYNTHROID, LEVOTHROID) 150 MCG tablet Take 1 tablet (150 mcg total) by mouth daily. 90 tablet 0  . Multiple Vitamin (MULTIVITAMIN) tablet Take 1 tablet by mouth daily.      . naproxen (NAPROSYN) 500 MG tablet Take 1 tablet (500 mg total) by mouth 2 (two) times daily with a  meal. 60 tablet 0  . rizatriptan (MAXALT-MLT) 10 MG disintegrating tablet TAKE 1 TABLET BY MOUTH AS NEEDED MAY REPEAT IN 2 HOURS IF NEEDED 10 tablet 0  . rosuvastatin (CRESTOR) 20 MG tablet Take 1 tablet (20 mg total) by mouth daily. 90 tablet 3  . zolpidem (AMBIEN) 10 MG tablet Take 10 mg by mouth at bedtime as needed.   2  . zoster vaccine live, PF, (ZOSTAVAX) 50932 UNT/0.65ML injection Inject 19,400 Units into the skin once. 1 each 0   No current facility-administered medications on file prior to visit.     Past Medical History  Diagnosis Date  . Hypocalcemia   . Migraine     improved since thyroid surgery, 2 years  . Thyroid cancer 2007    s/p thyroidectomy  . Hyperlipidemia   . hypothyroidism 2007    acquired, secondary to surgery for thyroid Ca  . Carotid artery occlusion   . 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 Repeat angiography in November of 2014 showed 50% distal aortic stenosis extending into the left common iliac artery which had 60-70% ostial stenosis. No other significant disease. I placed on balloon expandable stent into  left common iliac artery extending slightly into the distal aorta     Past Surgical History  Procedure Laterality Date  . Thyroid surgery  2007    secondary to thyroid cancer  .  Angioplasty illiac artery    . Cesarean section    . Nose surgery    . Skin cancer excision    . Abdominal aortagram N/A 10/14/2013    Procedure: ABDOMINAL [name];  Surgeon: Wellington Hampshire, MD;  Location: [facility] CATH LAB;  Service: Cardiovascular;  Laterality: N/A;     Family History  Problem Relation Age of Onset  . Coronary artery disease Maternal Grandmother   . Heart disease Maternal Grandmother 70  . Coronary artery disease Paternal Grandmother 41  . Heart disease Paternal Grandmother 66  . Alzheimer's disease Father   . Stroke Mother      Social  History   Social History  . Marital Status: Married    Spouse Name: N/A  . Number of Children: N/A  . Years of Education: N/A   Occupational History  . Not on file.   Social History Main Topics  . Smoking status: Former Smoker -- 1.00 packs/day for 20 years    Types: Cigarettes    Quit date: 10/28/2011  . Smokeless tobacco: Never Used  . Alcohol Use: No  . Drug Use: No  . Sexual Activity: Not on file   Other Topics Concern  . Not on file   Social History Narrative       PHYSICAL EXAM   BP 130/80 mmHg  Ht 5\' 7"  (1.702 m)  Wt 124 lb 12 oz (56.586 kg)  BMI 19.53 kg/m2 Constitutional: She is oriented to person, place, and time. She appears well-developed and well-nourished. No distress.  HENT: No nasal discharge.  Head: Normocephalic and atraumatic.  Eyes: Pupils are equal and round. Right eye exhibits no discharge. Left eye exhibits no discharge.  Neck: Normal range of motion. Neck supple. No JVD present. No thyromegaly present. No carotid bruits. Cardiovascular: Normal rate, regular rhythm, normal heart sounds. Exam reveals no gallop and no friction rub. No murmur heard.  Pulmonary/Chest: Effort normal and breath sounds normal. No stridor. No respiratory distress. She has no wheezes. She has no rales. She exhibits no tenderness.  Abdominal: Soft. Bowel sounds are normal. She exhibits no distension. There is no tenderness. There is no rebound and no guarding.  Musculoskeletal: Normal range of motion. She exhibits no edema and no tenderness.  Neurological: She is alert and oriented to person, place, and time. Coordination normal.  Skin: Skin is warm and dry. No rash noted. She is not diaphoretic. No erythema. No pallor.  Psychiatric: She has a normal mood and affect. Her behavior is normal. Judgment and thought content normal.  Vascular: Radial pulses are normal bilaterally.  Posterior tibial: Normal bilaterally. Dorsalis pedis: Normal on the right side and mildly  diminished on the left side  INO:MVEHM  Rhythm  WITHIN NORMAL LIMITS  ASSESSMENT AND PLAN

## 2015-08-23 ENCOUNTER — Ambulatory Visit (INDEPENDENT_AMBULATORY_CARE_PROVIDER_SITE_OTHER): Payer: BLUE CROSS/BLUE SHIELD | Admitting: Internal Medicine

## 2015-08-23 ENCOUNTER — Encounter: Payer: Self-pay | Admitting: Internal Medicine

## 2015-08-23 ENCOUNTER — Telehealth: Payer: Self-pay | Admitting: Internal Medicine

## 2015-08-23 VITALS — BP 122/68 | HR 85 | Temp 97.9°F | Resp 12 | Ht 67.5 in | Wt 126.0 lb

## 2015-08-23 DIAGNOSIS — E278 Other specified disorders of adrenal gland: Secondary | ICD-10-CM | POA: Diagnosis not present

## 2015-08-23 DIAGNOSIS — E890 Postprocedural hypothyroidism: Secondary | ICD-10-CM

## 2015-08-23 LAB — T4, FREE: FREE T4: 1.56 ng/dL (ref 0.60–1.60)

## 2015-08-23 LAB — TSH: TSH: 0.22 u[IU]/mL — ABNORMAL LOW (ref 0.35–4.50)

## 2015-08-23 LAB — POTASSIUM: Potassium: 4 mEq/L (ref 3.5–5.1)

## 2015-08-23 MED ORDER — DEXAMETHASONE 1 MG PO TABS: ORAL_TABLET | ORAL | Status: DC

## 2015-08-23 NOTE — Telephone Encounter (Signed)
Patient stated that she would like get her blood drawn at Dr Derrel Nip office, please fax her Cortisol lab order over to their office. Fax # [phone]

## 2015-08-23 NOTE — Patient Instructions (Addendum)
Please stop at the lab. I will send you the results through [place].  Continue to take the thyroid hormone every day, with water, >30 minutes before breakfast, separated by >4 hours from acid reflux medications, calcium, iron, multivitamins.  Please come back for a follow-up appointment in 6 months.

## 2015-08-23 NOTE — Telephone Encounter (Signed)
Tried to call pt. Unable to get an answer. Dr Derrel Nip is in the same system as we are and her lab will be able to access lab orders.

## 2015-08-23 NOTE — Progress Notes (Addendum)
Patient ID: Tracie Morris, female   DOB: [birth year], 59 y.o.   MRN: [id number]   HPI  Tracie Morris is a 59 y.o.-year-old female, referred by her PCP, Dr. 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  Pt's adrenal mass was incidentally found during investigation for kidney stones in 01/2015.  I reviewed pt's previous abdominal CT from 01/2015: Left adrenal nodule of 9 mm measuring -11 HU.  Of note, patient had a PET scan on 08/02/2015 and this nodule was not hypermetabolic.  No h/o hypokalemia, HTN, hyperglycemia, spells of HA + HTN. She does have a h/o migraines, relieved by prn Plavix.   Postsurgical hypothyroidism: Patient had total thyroidectomy in 2007 for thyroid cancer - Dr Carlis Abbott (ENT); 1 parathyroid removed. He was under the care of Dr Tobe Sos at that time. I do not have the pathology report. Pt did not have RAI tx. - She is on levothyroxine 175 >> 150 g daily in 07/21/2015. - She had fluctuating thyroid tests, with the latest TSH levels: Lab Results  Component Value Date   TSH 0.011* 07/11/2015   TSH 0.373* 01/31/2015   TSH 0.33* 08/30/2014   TSH 7.040* 07/12/2014   TSH 0.55 01/11/2014   TSH 1.230 07/07/2013   FREET4 1.39 07/07/2013   She is taking the levothyroxine: - Daily, not skipping doses - Fasting - In a.m. - With water - Eats breakfast more than 30 minutes later - No PPI, calcium, iron - + multivitamins  She has insomnia, joint pain, now better after decreasing the LT4 dose to 150 mcg daily.  ThyCA: We do not have the pathology of her thyroid sample, per review of chart, the FNAs were inconclusive, but unclear about the final pathology after thyroidectomy. We will try to obtain records.   No h/o RAI Tx.  She had a stent placed >> Seeing Dr Fletcher Anon.   ROS: Constitutional: + weight loss, no fatigue, + subjective hyperthermia, + poor sleep Eyes: no blurry  vision, no xerophthalmia ENT: no sore throat, no nodules palpated in throat, no dysphagia/odynophagia, no hoarseness Cardiovascular: no CP/+ SOB/no palpitations/leg swelling Respiratory: no cough/+ SOB Gastrointestinal: no N/V/D/C Musculoskeletal: no muscle/+ joint aches - L ankle and knee Skin: no rashes Neurological: + tremors - R hand/nonumbness/tingling/dizziness Psychiatric: no depression/+ anxiety + low libido  Past Medical History  Diagnosis Date  . Hypocalcemia   . Migraine     improved since thyroid surgery, 2 years  . Thyroid cancer 2007    s/p thyroidectomy  . Hyperlipidemia   . hypothyroidism 2007    acquired, secondary to surgery for thyroid Ca  . Carotid artery occlusion   . 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 Repeat angiography in November of 2014 showed 50% distal aortic stenosis extending into the left common iliac artery which had 60-70% ostial stenosis. No other significant disease. I placed on balloon expandable stent into  left common iliac artery extending slightly into the distal aorta   Past Surgical History  Procedure Laterality Date  . Thyroid surgery  2007    secondary to thyroid cancer  . Angioplasty illiac artery    . Cesarean section    . Nose surgery    . Skin cancer excision    . Abdominal aortagram N/A 10/14/2013    Procedure: ABDOMINAL [name];  Surgeon: Wellington Hampshire, MD;  Location: [facility] CATH LAB;  Service: Cardiovascular;  Laterality: N/A;   Social History   Social History  . Marital Status: Married    Spouse Name: N/A  . Number of Children: 1   Occupational History  . Manager    Social History Main Topics  . Smoking status: Former Smoker -- 1.00 packs/day for 20 years    Types: Cigarettes    Quit date: 10/28/2011  . Smokeless tobacco: Never Used  . Alcohol Use: No  . Drug Use: No   Current Outpatient Prescriptions on  File Prior to Visit  Medication Sig Dispense Refill  . ALPRAZolam (XANAX) 0.5 MG tablet Take 1 tablet (0.5 mg total) by mouth at bedtime as needed for anxiety. 30 tablet 3  . aspirin 81 MG tablet Take 1 tablet (81 mg total) by mouth daily. 30 tablet   . calcium carbonate (OS-CAL) 600 MG TABS Take 1,200 mg by mouth daily with breakfast.     . Cholecalciferol (VITAMIN D) 2000 UNITS tablet Take 2,000 Units by mouth daily.    . clopidogrel (PLAVIX) 75 MG tablet Take 1 tablet (75 mg total) by mouth once. ONE EVERY 5 DAYS 30 tablet 6  . levothyroxine (SYNTHROID, LEVOTHROID) 150 MCG tablet Take 1 tablet (150 mcg total) by mouth daily. 90 tablet 0  . Multiple Vitamin (MULTIVITAMIN) tablet Take 1 tablet by mouth daily.      . naproxen (NAPROSYN) 500 MG tablet Take 1 tablet (500 mg total) by mouth 2 (two) times daily with a meal. 60 tablet 0  . rizatriptan (MAXALT-MLT) 10 MG disintegrating tablet TAKE 1 TABLET BY MOUTH AS NEEDED MAY REPEAT IN 2 HOURS IF NEEDED 10 tablet 0  . rosuvastatin (CRESTOR) 20 MG tablet Take 1 tablet (20 mg total) by mouth daily. 90 tablet 3  . zolpidem (AMBIEN) 10 MG tablet Take 10 mg by mouth at bedtime as needed.   2  . zoster vaccine live, PF, (ZOSTAVAX) 71245 UNT/0.65ML injection Inject 19,400 Units into the skin once. 1 each 0   No current facility-administered medications on file prior to visit.   Allergies  Allergen Reactions  . Atorvastatin Other (See Comments)    Myalgias   . Darvocet [Propoxyphene N-Acetaminophen] Nausea And Vomiting  . Demerol Nausea And Vomiting  . Methocarbamol Other (See Comments)  . Ambien [Zolpidem Tartrate] Palpitations    Anxiety , trembling.   Family History  Problem Relation Age of Onset  . Coronary artery disease Maternal Grandmother   . Heart disease Maternal Grandmother 70  . Coronary artery disease Paternal Grandmother 68  . Heart disease Paternal Grandmother 8  . Alzheimer's disease Father   . Stroke Mother    PE: BP  122/68 mmHg  Pulse 85  Temp(Src) 97.9 F (36.6 C) (Oral)  Resp 12  Ht 5' 7.5" (1.715 m)  Wt 126 lb (57.153 kg)  BMI 19.43 kg/m2  SpO2 96% Wt Readings from Last 3 Encounters:  08/23/15 126 lb (57.153 kg)  08/11/15 124 lb 12 oz (56.586 kg)  07/21/15 124 lb (56.246 kg)   Constitutional: thin, in NAD Eyes: PERRLA, EOMI, no exophthalmos, no lid lag, no stare ENT: moist mucous membranes, no neck masses, no cervical lymphadenopathy Cardiovascular: RRR, No MRG Respiratory: CTA B Gastrointestinal: abdomen soft, NT, ND, BS+ Musculoskeletal: no deformities, strength intact in all 4 Skin: moist, warm, no rashes Neurological: + tremor with outstretched R hand, DTR normal in all 4  ASSESSMENT: 1. Adrenal incidentaloma  2. Postsurgical hypothyroidism  3. ThyCA  PLAN:  1. Patient with a L 9 mm adrenal nodule discovered incidentally. - We discussed about the fact that there are 3 possible scenarios: - A nonfunctioning adrenal nodule - A functioning adrenal adenoma - which can hypersecrete catecholamines/metanephrines, cortisol, or aldosterone - Adrenal cancer/metastasis We do not have blood tests to check for cancer, but the best indicator is lack of change in size and appearance over time.  - To differentiate between a functioning and a nonfunctioning adrenal nodule, we'll need to rule out hypersecretion by checking the following tests  - dexamethasone suppression test to rule out Cushing syndrome (6% of adrenal incidentalomas) - this might be a problem because patient is on HRT, known to interfere with the test by increasing cortisol binding globulin >> false positive tests. If the cortisol level returns >5, will need 24h urine free cortisol - Plasma fractionated metanephrines and catecholamines to rule out pheochromocytoma (3% of adrenal incidentalomas) - Plasma renin activity and aldosterone level to rule out primary hyperaldosteronism (0.6% of adrenal incidentalomas) - I ordered the  above tests today. I advised pt to take the dexamethasone tablets (1 mg total dose, sent to pharmacy) at 11 PM the night before coming to the lab to have a cortisol level drawn. I also added dexamethasone level. - We discussed about the need for an other CT scan, and I believe that we can wait for another 6 mo and then order a dedicated adrenal CT. I will need to order it with and without contrast, if the Hounsfield units are low and the lesion again appears benign, we might not need to get the contrasted CT for washout.  - we discussed about f/u:   hormonal testing yearly for 5 years   CT scans yearly x1-2 - I explained all the above to the patient, and she agrees with the plan. - I will see her back in 6 mo  2. Hypothyroidism - uncontrolled - on a rather high LT4 dose -  She is taking the thyroid hormone correctly: every day, with water, >30 minutes before breakfast, separated by >4 hours from acid reflux medications, calcium, iron, multivitamins. - check TFTs today >> target  Normal range for TSH  3. ThyCA - pathology pending from [facility], [facility] - if we do not obtain the path report, will need to follow her with neck U/S - discussed that there is no need to keep the TSH suppressed beyond 1 year after thyroid Sx  - time spent with the patient: 1 hour, of which >50% was spent in obtaining information about her 3 conditions described above, reviewing her previous labs, evaluations, and treatments, counseling her about her condition (please see the discussed topics above), and developing a plan to further investigate and treat them; she had a number of questions which I addressed.  08/24/2015 I received the report of her thyroid pathology from [facility], from 02/06/2006: - Total thyroidectomy: A. Right thyroid: Nodular hyperplasia B. Left thyroid: Nodular hyperplasia with 1.9 cm dominant adenomatoid nodule. Parathyroid tissue in a single  section, measuring 0.7 mm. Conclusion: There is nodular hyperplasia (multinodular goiter). There is no evidence of malignancy.   I will need to take off thyroid cancer from her problem list.  Office Visit on 08/23/2015  Component Date Value Ref Range Status  . TSH 08/23/2015 0.22* 0.35 - 4.50 uIU/mL Final  . Free T4 08/23/2015 1.56  0.60 - 1.60 ng/dL Final  . PRA LC/MS/MS 08/23/2015 0.47  0.25 - 5.82  ng/mL/h Final  . ALDO / PRA Ratio 08/23/2015 6.4  0.9 - 28.9 Ratio Final  . Aldosterone 08/23/2015 3   Final   Comment:      Adult Reference Ranges for Aldosterone,    LC/MS/MS:       Upright 8:00-10:00 am    < or = 28 ng/dL     Upright 4:00-6:00 pm     < or = 21 ng/dL     Supine  8:00-10:00 am    3-16 ng/dL   This test was developed and its analytical performance characteristics have been determined by [facility]. It has not been cleared or approved by FDA. This assay has been validated pursuant to the CLIA regulations and is used for clinical purposes.     . Potassium 08/23/2015 4.0  3.5 - 5.1 mEq/L Final  . Metanephrine, Free 08/23/2015 38  <=57 pg/mL Final  . Normetanephrine, Free 08/23/2015 151* <=148 pg/mL Final  . Total Metanephrines-Plasma 08/23/2015 189  <=205 pg/mL Final   Comment: For additional information, please refer to http://education.questdiagnostics.com/faq/MetFractFree (This link is being provided for informational/educatio informational/educational purposes only.) Elevations >4-fold upper reference range: strongly suggestive of a pheochromocytoma(1). Elevations >1- 4-fold upper reference range: significant but not diagnostic, may be due to medications or stress. Suggest running 24 hr urine fractionated metanephrines and/or serum Chromagranin A for confirmation. Reference: (1)Algeciras-Schimnich A et al, Plasma Chromogranin A or Urine Fractionated Metanephrines Follow-Up Testing Improves the Diagnostic Accuracy  of Plasma Fractionated Metanephrines for Pheochromocytoma. The Journal of Clinical Endocrinology # Metabolism 93(1), 91-95, 2008.   Marland Kitchen Epinephrine 08/23/2015 REPORT   Final   Comment: Results are below reportable range for this analyte, which is 20 pg/mL.   Marland Kitchen Norepinephrine 08/23/2015 534   Final  . Dopamine 08/23/2015 REPORT   Final   Comment: Results are below reportable range for this analyte, which is 30 pg/mL.   . Catecholamines, Total 08/23/2015 534   Final   Comment: Adult Reference Ranges for Catecholamines, Plasma Epinephrine         Supine:  LESS THAN 50 pg/mL                     Upright: LESS THAN 95 pg/mL Norepinephrine      Supine:  112-658 pg/mL                     Upright: [phone] pg/mL Dopamine            Supine:  LESS THAN 30 pg/mL                     Upright: LESS THAN 30 pg/mL Total (N+E)         Supine:  123-671 pg/mL                     Upright: [phone] pg/mL Pediatric Reference Ranges for Catecholamines, Plasma Due to stress, plasma catecholamine levels are generally unreliable in infants and small children. Urinary catecholamine assays are more reliable. Epinephrine         Supine:  LESS THAN OR EQUAL    3-15 Years                TO 464 pg/mL                     Upright: Not Available Norepinephrine      Supine:  LESS THAN OR EQUAL  3-15 Years                TO 1251 pg/mL                     Upright: Not Available Dopamine            Supine:  LESS THAN 60 pg/mL    3-15 Years       Upright: Not Available Pedi                          atric data from [employer] [phone].     TSH suppressed, I will advise her to back off the levothyroxine dose to 125 g daily. We'll need to recheck her thyroid tests in 5-6 weeks. Plasma renin activity and aldosteronism levels are normal. Plasma catecholamines and metanephrines are normal, except slightly elevated normetanephrine. Nothing to do for now, will repeat her  Plasma metanephrines in a year.    Dexamethasone suppression test results are normal: Component     Latest Ref Rng 08/31/2015         1:31 PM  Cortisol, Plasma      1.2

## 2015-08-24 MED ORDER — LEVOTHYROXINE SODIUM 125 MCG PO TABS: 125.0000 ug | ORAL_TABLET | Freq: Every day | ORAL | Status: DC

## 2015-08-27 LAB — METANEPHRINES, PLASMA
METANEPHRINE FREE: 38 pg/mL (ref <=57)
Normetanephrine, Free: 151 pg/mL — ABNORMAL HIGH (ref <=148)
TOTAL METANEPHRINES-PLASMA: 189 pg/mL (ref <=205)

## 2015-08-27 LAB — CATECHOLAMINES, FRACTIONATED, PLASMA
Catecholamines, Total: 534 pg/mL
Norepinephrine: 534 pg/mL

## 2015-08-28 LAB — ALDOSTERONE + RENIN ACTIVITY W/ RATIO
ALDO / PRA Ratio: 6.4 Ratio (ref 0.9–28.9)
Aldosterone: 3 ng/dL
PRA LC/MS/MS: 0.47 ng/mL/h (ref 0.25–5.82)

## 2015-08-31 ENCOUNTER — Other Ambulatory Visit (INDEPENDENT_AMBULATORY_CARE_PROVIDER_SITE_OTHER): Payer: BLUE CROSS/BLUE SHIELD

## 2015-08-31 ENCOUNTER — Other Ambulatory Visit: Payer: Self-pay | Admitting: *Deleted

## 2015-08-31 ENCOUNTER — Telehealth: Payer: Self-pay | Admitting: Internal Medicine

## 2015-08-31 DIAGNOSIS — E890 Postprocedural hypothyroidism: Secondary | ICD-10-CM

## 2015-08-31 DIAGNOSIS — E079 Disorder of thyroid, unspecified: Secondary | ICD-10-CM | POA: Diagnosis not present

## 2015-08-31 DIAGNOSIS — E279 Disorder of adrenal gland, unspecified: Secondary | ICD-10-CM

## 2015-08-31 DIAGNOSIS — E278 Other specified disorders of adrenal gland: Secondary | ICD-10-CM

## 2015-08-31 LAB — T4, FREE: FREE T4: 1.2 ng/dL (ref 0.60–1.60)

## 2015-08-31 LAB — TSH: TSH: 0.1 u[IU]/mL — AB (ref 0.35–4.50)

## 2015-08-31 NOTE — Telephone Encounter (Signed)
Pt is requesting an order for cortisol please she will have it drawn at [facility] she is there now

## 2015-08-31 NOTE — Addendum Note (Signed)
Addended by: Karlene Einstein D on: 08/31/2015 01:31 PM   Modules accepted: Orders

## 2015-08-31 NOTE — Telephone Encounter (Signed)
Order in.

## 2015-09-01 LAB — CORTISOL: CORTISOL PLASMA: 1.2 ug/dL

## 2015-09-01 LAB — T4 AND TSH
T4 TOTAL: 9.4 ug/dL (ref 4.5–12.0)
TSH: 0.016 u[IU]/mL — ABNORMAL LOW (ref 0.450–4.500)

## 2015-09-05 ENCOUNTER — Ambulatory Visit: Payer: BLUE CROSS/BLUE SHIELD | Admitting: Internal Medicine

## 2015-09-25 ENCOUNTER — Encounter: Payer: Self-pay | Admitting: Internal Medicine

## 2015-09-27 ENCOUNTER — Other Ambulatory Visit: Payer: Self-pay | Admitting: *Deleted

## 2015-09-27 DIAGNOSIS — E032 Hypothyroidism due to medicaments and other exogenous substances: Secondary | ICD-10-CM

## 2015-10-01 ENCOUNTER — Other Ambulatory Visit: Payer: Self-pay | Admitting: Internal Medicine

## 2015-10-03 NOTE — Telephone Encounter (Signed)
Refill on Ambien. 

## 2015-10-03 NOTE — Telephone Encounter (Signed)
Rx faxed to [facility].

## 2015-10-03 NOTE — Telephone Encounter (Signed)
Ok to refill,  printed rx  

## 2015-10-06 ENCOUNTER — Telehealth: Payer: Self-pay

## 2015-10-06 NOTE — Telephone Encounter (Signed)
Paperwork completed and placed in Dr. Demetrios Isaacs sign folder for PA for Ambien.

## 2015-10-07 ENCOUNTER — Other Ambulatory Visit (INDEPENDENT_AMBULATORY_CARE_PROVIDER_SITE_OTHER): Payer: BLUE CROSS/BLUE SHIELD

## 2015-10-07 DIAGNOSIS — E032 Hypothyroidism due to medicaments and other exogenous substances: Secondary | ICD-10-CM

## 2015-10-07 LAB — T4, FREE: FREE T4: 1.53 ng/dL (ref 0.60–1.60)

## 2015-10-07 LAB — TSH: TSH: 0.32 u[IU]/mL — AB (ref 0.35–4.50)

## 2015-10-11 ENCOUNTER — Other Ambulatory Visit: Payer: Self-pay | Admitting: *Deleted

## 2015-10-11 DIAGNOSIS — E032 Hypothyroidism due to medicaments and other exogenous substances: Secondary | ICD-10-CM

## 2015-10-11 MED ORDER — LEVOTHYROXINE SODIUM 112 MCG PO TABS: 112.0000 ug | ORAL_TABLET | Freq: Every day | ORAL | Status: DC

## 2015-10-17 ENCOUNTER — Encounter: Payer: Self-pay | Admitting: Emergency Medicine

## 2015-10-17 ENCOUNTER — Ambulatory Visit (INDEPENDENT_AMBULATORY_CARE_PROVIDER_SITE_OTHER): Payer: BC Managed Care – PPO

## 2015-10-17 ENCOUNTER — Ambulatory Visit
Admission: EM | Admit: 2015-10-17 | Discharge: 2015-10-17 | Disposition: A | Discharge status: Home / Self Care | Payer: BC Managed Care – PPO | Attending: Family Medicine | Admitting: Family Medicine

## 2015-10-17 DIAGNOSIS — J40 Bronchitis, not specified as acute or chronic: Secondary | ICD-10-CM | POA: Diagnosis not present

## 2015-10-17 MED ORDER — AZITHROMYCIN 250 MG PO TABS: ORAL_TABLET | ORAL | Status: DC

## 2015-10-17 MED ORDER — GUAIFENESIN-CODEINE 100-10 MG/5ML PO SOLN: 5.0000 mL | Freq: Three times a day (TID) | ORAL | Status: DC | PRN

## 2015-10-17 NOTE — ED Notes (Signed)
Patient states she developed sinus and chest congestion on Saturday.  Feels like her chest is tight from coughing.

## 2015-10-17 NOTE — ED Provider Notes (Signed)
[facility]  ____________________________________________  Time seen: Approximately 9:50 AM  I have reviewed the triage vital signs and the nursing notes.   HISTORY  Chief Complaint Nasal Congestion   HPI Tracie Morris is a 59 y.o. female presents for complaints of 3-4 days of runny nose, congestion, and cough. Reports can feel congestion in chest. Denies chest pain or shortness of breath. Reports continues to eat and drink well. Denies fevers. States wanted to make sure she did not have pneumonia. Reports frequently getting thick nasal drainage from nose and coughing. States coughing throughout day but more at night. Reports coughing up thick white phlegm. Denies wheezing.   Denies known sick contacts. Denies chest pain, shortness of breath, dizziness, weakness, leg swelling or other complaints.   Past Medical History  Diagnosis Date  . Hypocalcemia   . Migraine     improved since thyroid surgery, 2 years  . Thyroid cancer 2007    s/p thyroidectomy  . Hyperlipidemia   . hypothyroidism 2007    acquired, secondary to surgery for thyroid Ca  . Carotid artery occlusion   . 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 Repeat angiography in November of 2014 showed 50% distal aortic stenosis extending into the left common iliac artery which had 60-70% ostial stenosis. No other significant disease. I placed on balloon expandable stent into  left common iliac artery extending slightly into the distal aorta    Patient Active Problem List   Diagnosis Date Noted  . Dyspnea 08/11/2015  . Solitary pulmonary nodule 07/21/2015  . Adrenal mass, left ([place]) 07/21/2015  . Insomnia, persistent 02/01/2015  . Osteoarthritis cervical spine 02/01/2015  . Encounter for preventive health examination 07/13/2014  . Rhinitis 07/13/2014  . S/P Nissen fundoplication (with gastrostomy tube  placement) ([place]) 01/11/2014  . Atypical chest pain 07/28/2013  . Peripheral arterial disease ([place])   . Post menopausal syndrome 07/07/2013  . Peripheral vascular disease with pain at rest [facility]) 05/16/2012  . Screening for cervical cancer 10/20/2011  . Screening for breast cancer 10/20/2011  . Screening for colon cancer 10/20/2011  . Iatrogenic hypothyroidism   . Hyperlipidemia   . Migraine     Past Surgical History  Procedure Laterality Date  . Thyroid surgery  2007    secondary to thyroid cancer  . Angioplasty illiac artery    . Cesarean section    . Nose surgery    . Skin cancer excision    . Abdominal aortagram N/A 10/14/2013    Procedure: ABDOMINAL [name];  Surgeon: Wellington Hampshire, MD;  Location: [facility] C[facility] LAB;  Service: Cardiovascular;  Laterality: N/A;    Current Outpatient Rx  Name  Route  Sig  Dispense  Refill  .           .           . calcium carbonate (OS-CAL) 600 MG TABS   Oral   Take 1,200 mg by mouth daily with breakfast.          . Cholecalciferol (VITAMIN D) 2000 UNITS tablet   Oral   Take 2,000 Units by mouth daily.         . clopidogrel (PLAVIX) 75 MG tablet   Oral   Take 1 tablet (75 mg total) by mouth once. ONE EVERY 5 DAYS   30 tablet   6   . levothyroxine (SYNTHROID, LEVOTHROID) 112 MCG  tablet   Oral   Take 1 tablet (112 mcg total) by mouth daily.   45 tablet   1   . Multiple Vitamin (MULTIVITAMIN) tablet   Oral   Take 1 tablet by mouth daily.           . naproxen (NAPROSYN) 500 MG tablet   Oral   Take 1 tablet (500 mg total) by mouth 2 (two) times daily with a meal.   60 tablet   0   . rizatriptan (MAXALT-MLT) 10 MG disintegrating tablet      TAKE 1 TABLET BY MOUTH AS NEEDED MAY REPEAT IN 2 HOURS IF NEEDED   10 tablet   0   . rosuvastatin (CRESTOR) 20 MG tablet   Oral   Take 1 tablet (20 mg total) by mouth daily.   90 tablet   3   .           .           .           .           .              Allergies Atorvastatin; Darvocet; Demerol; Methocarbamol; and Ambien  Family History  Problem Relation Age of Onset  . Coronary artery disease Maternal Grandmother   . Heart disease Maternal Grandmother 70  . Coronary artery disease Paternal Grandmother 41  . Heart disease Paternal Grandmother 50  . Alzheimer's disease Father   . Stroke Mother     Social History Social History  Substance Use Topics  . Smoking status: Former Smoker -- 1.00 packs/day for 20 years    Types: Cigarettes    Quit date: 10/28/2011  . Smokeless tobacco: Never Used  . Alcohol Use: No    Review of Systems Constitutional: No fever/chills Eyes: No visual changes. ENT: No sore throat.positive runny nose, congestion, cough.  Cardiovascular: Denies chest pain. Respiratory: Denies shortness of breath. Gastrointestinal: No abdominal pain.  No nausea, no vomiting.  No diarrhea.  No constipation. Genitourinary: Negative for dysuria. Musculoskeletal: Negative for back pain. Skin: Negative for rash. Neurological: Negative for headaches, focal weakness or numbness.  10-point ROS otherwise negative.  ____________________________________________   PHYSICAL EXAM:  VITAL SIGNS: ED Triage Vitals  Enc Vitals Group     BP 10/17/15 0933 97/57 mmHg     Pulse Rate 10/17/15 0933 93     Resp 10/17/15 0933 18     Temp 10/17/15 0933 98.5 F (36.9 C)     Temp Source 10/17/15 0933 Tympanic     SpO2 10/17/15 0933 96 %     Weight 10/17/15 0933 122 lb (55.339 kg)     Height 10/17/15 0933 5\' 7"  (1.702 m)     Head Cir --      Peak Flow --      Pain Score 10/17/15 0933 0     Pain Loc --      Pain Edu? --      Excl. in [place]? --    Today's Vitals   10/17/15 0933 10/17/15 1018  BP: 97/57 126/57  Pulse: 93 94  Temp: 98.5 F (36.9 C) 98.1 F (36.7 C)  TempSrc: Tympanic Tympanic  Resp: 18 16  Height: 5\' 7"  (1.702 m)   Weight: 122 lb (55.339 kg)   SpO2: 96% 96%  PainSc: 0-No pain       Constitutional:  Alert and oriented. Well appearing and in no acute distress.  Eyes: Conjunctivae are normal. PERRL. EOMI. Head: Atraumatic. mild TTP bilateral maxillary sinuses, frontal sinuses nontender. No swelling. No erythema.   Ears: no erythema, normal TMs bilaterally.   Nose: nasal congestion, bilateral nasal turbinate erythema. Nares patent.  Mouth/Throat: Mucous membranes are moist.  Oropharynx non-erythematous.No tonsillar swelling or exudate.  Neck: No stridor.  No cervical spine tenderness to palpation. Hematological/Lymphatic/Immunilogical: No cervical lymphadenopathy. Cardiovascular: Normal rate, regular rhythm. Grossly normal heart sounds.  Good peripheral circulation. Respiratory: Normal respiratory effort.  No retractions. Scattered rhonchi, no wheezes, no rales. Good air movement. Intermittent dry cough in room.  Gastrointestinal: Soft and nontender. No distention. Normal Bowel sounds.   No CVA tenderness. Musculoskeletal: No lower or upper extremity tenderness nor edema. Bilateral pedal pulses equal and easily palpated.  Neurologic:  Normal speech and language. No gross focal neurologic deficits are appreciated. No gait instability. Skin:  Skin is warm, dry and intact. No rash noted. Psychiatric: Mood and affect are normal. Speech and behavior are normal.  ____________________________________________   LABS (all labs ordered are listed, but only abnormal results are displayed)  Labs Reviewed - No data to display  RADIOLOGY   EXAM: CHEST 2 VIEW  COMPARISON: 02/17/2015  FINDINGS: Cardiomediastinal silhouette is stable. Mild hyperinflation. No acute infiltrate or pleural effusion. No pulmonary edema. Bony thorax is unremarkable.  IMPRESSION: No active cardiopulmonary disease. Mild hyperinflation.   Electronically Signed By: Lahoma Crocker M.D. On: 10/17/2015 10:11  I, Marylene Land, personally discussed these images and results by phone with the on-call radiologist and  used this discussion as part of my medical decision making.    INITIAL IMPRESSION / ASSESSMENT AND PLAN / ED COURSE  Pertinent labs & imaging results that were available during my care of the patient were reviewed by me and considered in my medical decision making (see chart for details).  Well appearing. No acute distress. 3-4 days runny nose, cough and congestion. States productive cough. Denies chest pain, shortness of breath or wheezing. Suspect bronchitis. Will treat supportively with oral guaifenesin/codeine as patient reports good results in past with same, rest, fluids, and follow up. Patient states she is concerned going in to Holiday week that if cough and congestion does not improve, she will not be able to be seen by PCP. Rx for azithromycin given and directed patient if symptoms continue without improvement or fever after 2-3 days then start Rx and follow up closely. Patient verbalized understanding. Patient also reports will follow up closely with her PCP.   Discussed follow up with Primary care physician this week. Discussed follow up and return parameters including no resolution or any worsening concerns. Patient verbalized understanding and agreed to plan.   ____________________________________________   FINAL CLINICAL IMPRESSION(S) / ED DIAGNOSES  Final diagnoses:  Bronchitis       Marylene Land, NP 10/23/15 0900

## 2015-10-17 NOTE — Discharge Instructions (Signed)
Take medication as prescribed. Rest. Eat and drink well.   Follow up with your primary care physician this week as needed. Return to Urgent care as needed for new or worsening concerns.

## 2015-10-24 ENCOUNTER — Ambulatory Visit (INDEPENDENT_AMBULATORY_CARE_PROVIDER_SITE_OTHER): Payer: BC Managed Care – PPO | Admitting: Internal Medicine

## 2015-10-24 ENCOUNTER — Encounter: Payer: Self-pay | Admitting: Internal Medicine

## 2015-10-24 VITALS — BP 122/70 | HR 87 | Temp 98.2°F | Resp 12 | Ht 67.0 in | Wt 124.2 lb

## 2015-10-24 DIAGNOSIS — J441 Chronic obstructive pulmonary disease with (acute) exacerbation: Secondary | ICD-10-CM | POA: Diagnosis not present

## 2015-10-24 DIAGNOSIS — E032 Hypothyroidism due to medicaments and other exogenous substances: Secondary | ICD-10-CM

## 2015-10-24 DIAGNOSIS — E785 Hyperlipidemia, unspecified: Secondary | ICD-10-CM

## 2015-10-24 DIAGNOSIS — Z1239 Encounter for other screening for malignant neoplasm of breast: Secondary | ICD-10-CM | POA: Diagnosis not present

## 2015-10-24 MED ORDER — GUAIFENESIN-CODEINE 100-10 MG/5ML PO SOLN: 5.0000 mL | Freq: Three times a day (TID) | ORAL | Status: DC | PRN

## 2015-10-24 MED ORDER — ROSUVASTATIN CALCIUM 20 MG PO TABS: 20.0000 mg | ORAL_TABLET | Freq: Every day | ORAL | Status: DC

## 2015-10-24 MED ORDER — ALBUTEROL SULFATE HFA 108 (90 BASE) MCG/ACT IN AERS: 2.0000 | INHALATION_SPRAY | Freq: Four times a day (QID) | RESPIRATORY_TRACT | Status: DC | PRN

## 2015-10-24 MED ORDER — PREDNISONE 10 MG PO TABS: ORAL_TABLET | ORAL | Status: DC

## 2015-10-24 MED ORDER — ZOLPIDEM TARTRATE 10 MG PO TABS: 10.0000 mg | ORAL_TABLET | Freq: Every evening | ORAL | Status: DC | PRN

## 2015-10-24 NOTE — Progress Notes (Signed)
Subjective:  Patient ID: Tracie Morris, female    DOB: 03/18/1956  Age: 59 y.o. MRN: [id number]  CC: The primary encounter diagnosis was Breast cancer screening. Diagnoses of Hyperlipemia, Iatrogenic hypothyroidism, and Obstructive chronic bronchitis with exacerbation ([place]) were also pertinent to this visit.  HPI Tracie Morris presents for persistent URi symptoms .  Started Nov 19th with sore throat and chest congestion,  Cough started by end of day nonproductive initially, following day developed chills, one week ago was treated by  Urgent care with azithromycin for presumed RLL pneumonia (CXR was reportedly done and clear).  Cough became productive and sinsuses started draining, but now the sinus  drainage is clear.  Z pack did not "do any good" so she started taking amoxicillin 500  Mg bid for the past 4  Days, obtained without rx at her [employer] store. Her cough is  Primarily brought on by exertion  Using cough medicine with codeine which helped .     ER records reviewed with patient today   2) Iatrogenic hypothyroidism:  Was referred to to Dr. Renne Morris who obtained her surgical pathology report and determined that patient never had thyroid CA, contrary to patient's impression and recollection.  Tracie Morris has  reduced her dose to 112 mcg  in mid November after last TSH.    3) Chronic insomnia:  Patient has been managing with alternating doses of zolpidem 5 MG AND alprazolam 0.5 mg  To avoid development of dependence. The risks and benefits of benzodiazepine use werereviewed with patient today including excessive sedation leading to respiratory depression,  impaired thinking/driving, and addiction.  Patient was advised to avoid concurrent use with alcohol, to use medication only as needed and not to share with others  .    Outpatient Prescriptions Prior to Visit  Medication Sig Dispense Refill  . ALPRAZolam (XANAX) 0.5 MG tablet Take 1 tablet (0.5 mg total) by mouth at bedtime as  needed for anxiety. 30 tablet 3  . aspirin 81 MG tablet Take 1 tablet (81 mg total) by mouth daily. 30 tablet   . calcium carbonate (OS-CAL) 600 MG TABS Take 1,200 mg by mouth daily with breakfast.     . Cholecalciferol (VITAMIN D) 2000 UNITS tablet Take 2,000 Units by mouth daily.    . clopidogrel (PLAVIX) 75 MG tablet Take 1 tablet (75 mg total) by mouth once. ONE EVERY 5 DAYS 30 tablet 6  . levothyroxine (SYNTHROID, LEVOTHROID) 112 MCG tablet Take 1 tablet (112 mcg total) by mouth daily. 45 tablet 1  . Multiple Vitamin (MULTIVITAMIN) tablet Take 1 tablet by mouth daily.      . rizatriptan (MAXALT-MLT) 10 MG disintegrating tablet TAKE 1 TABLET BY MOUTH AS NEEDED MAY REPEAT IN 2 HOURS IF NEEDED 10 tablet 0  . rosuvastatin (CRESTOR) 20 MG tablet Take 1 tablet (20 mg total) by mouth daily. 90 tablet 3  . zoster vaccine live, PF, (ZOSTAVAX) 91478 UNT/0.65ML injection Inject 19,400 Units into the skin once. (Patient not taking: Reported on 10/24/2015) 1 each 0  . azithromycin (ZITHROMAX Z-PAK) 250 MG tablet Take 2 tablets (500 mg) on  Day 1,  followed by 1 tablet (250 mg) once daily on Days 2 through 5. (Patient not taking: Reported on 10/24/2015) 6 each 0  . dexamethasone (DECADRON) 1 MG tablet Take 1 tablet by mouth once at 11 pm, before coming for labs at 8 am the next morning (Patient not taking: Reported on 10/24/2015) 1 tablet 0  . guaiFENesin-codeine  100-10 MG/5ML syrup Take 5 mLs by mouth 3 (three) times daily as needed for cough. (Patient not taking: Reported on 10/24/2015) 75 mL 0  . naproxen (NAPROSYN) 500 MG tablet Take 1 tablet (500 mg total) by mouth 2 (two) times daily with a meal. (Patient not taking: Reported on 10/24/2015) 60 tablet 0  . zolpidem (AMBIEN) 10 MG tablet TAKE ONE TABLET BY MOUTH AT BEDTIME AS NEEDED FOR SLEEP (Patient not taking: Reported on 10/24/2015) 30 tablet 3   No facility-administered medications prior to visit.    Review of Systems;  Patient denies headache,  fevers, malaise, unintentional weight loss, skin rash, eye pain, sinus congestion and sinus pain, sore throat, dysphagia,  hemoptysis , cough, dyspnea, wheezing, chest pain, palpitations, orthopnea, edema, abdominal pain, nausea, melena, diarrhea, constipation, flank pain, dysuria, hematuria, urinary  Frequency, nocturia, numbness, tingling, seizures,  Focal weakness, Loss of consciousness,  Tremor, insomnia, depression, anxiety, and suicidal ideation.      Objective:  BP 122/70 mmHg  Pulse 87  Temp(Src) 98.2 F (36.8 C) (Oral)  Resp 12  Ht 5\' 7"  (1.702 m)  Wt 124 lb 4 oz (56.359 kg)  BMI 19.46 kg/m2  SpO2 98%  BP Readings from Last 3 Encounters:  10/24/15 122/70  10/17/15 126/57  08/23/15 122/68    Wt Readings from Last 3 Encounters:  10/24/15 124 lb 4 oz (56.359 kg)  10/17/15 122 lb (55.339 kg)  08/23/15 126 lb (57.153 kg)    General appearance: alert, cooperative and appears stated age Ears: normal TM's and external ear canals both ears Throat: lips, mucosa, and tongue normal; teeth and gums normal Neck: no adenopathy, no carotid bruit, supple, symmetrical, trachea midline and thyroid not enlarged, symmetric, no tenderness/mass/nodules Back: symmetric, no curvature. ROM normal. No CVA tenderness. Lungs: clear to auscultation bilaterally Heart: regular rate and rhythm, S1, S2 normal, no murmur, click, rub or gallop Abdomen: soft, non-tender; bowel sounds normal; no masses,  no organomegaly Pulses: 2+ and symmetric Skin: Skin color, texture, turgor normal. No rashes or lesions Lymph nodes: Cervical, supraclavicular, and axillary nodes normal.  No results found for: HGBA1C  Lab Results  Component Value Date   CREATININE 0.97 02/17/2015   CREATININE 0.8 08/30/2014   CREATININE 0.9 07/12/2014    Lab Results  Component Value Date   WBC 9.7 02/17/2015   HGB 15.7 02/17/2015   HCT 45.4 02/17/2015   PLT 207 02/17/2015   GLUCOSE 224* 02/17/2015   CHOL 145 07/11/2015    TRIG 103.0 07/11/2015   HDL 39.50 07/11/2015   LDLCALC 85 07/11/2015   ALT 13 08/30/2014   AST 16 08/30/2014   NA 137 02/17/2015   K 4.0 08/23/2015   CL 105 02/17/2015   CREATININE 0.97 02/17/2015   BUN 20 02/17/2015   CO2 23 02/17/2015   TSH 0.32* 10/07/2015   INR 0.96 10/14/2013   MICROALBUR 0.6 01/11/2014    Dg Chest 2 View  10/17/2015  CLINICAL DATA:  Productive cough, congestion for 3 days EXAM: CHEST  2 VIEW COMPARISON:  02/17/2015 FINDINGS: Cardiomediastinal silhouette is stable. Mild hyperinflation. No acute infiltrate or pleural effusion. No pulmonary edema. Bony thorax is unremarkable. IMPRESSION: No active cardiopulmonary disease.  Mild hyperinflation. Electronically Signed   By: Lahoma Crocker M.D.   On: 10/17/2015 10:11    Assessment & Plan:   Problem List Items Addressed This Visit    Iatrogenic hypothyroidism    Appreciate Dr Chrissie Noa evaluation and confirmation that there was indeed no cancerous cells  in 2007 path report.  Suppression of TSH no longer necessary,  And LT4 dose was decreased in mid November.   Lab Results  Component Value Date   TSH 0.32* 10/07/2015         Obstructive chronic bronchitis with exacerbation ([place])    Given patient's long tobacco history , hyperinflation seen on  ED's Nov 21 chest x ray and recent use of antibiotics (azithromycin,  Followed by self prescribed amoxicillin) with failure to resolve cough, I suspect she has mild COPD with exacerbation which should respond to steroids and bronchodilators.       Relevant Medications   predniSONE (DELTASONE) 10 MG tablet   albuterol (PROVENTIL HFA;VENTOLIN HFA) 108 (90 BASE) MCG/ACT inhaler   guaiFENesin-codeine 100-10 MG/5ML syrup    Other Visit Diagnoses    Breast cancer screening    -  Primary    Relevant Orders    MM DIGITAL SCREENING BILATERAL    Hyperlipemia        Relevant Medications    rosuvastatin (CRESTOR) 20 MG tablet       I have discontinued Ms. Sneath's naproxen,  dexamethasone, zolpidem, and azithromycin. I am also having her start on predniSONE, albuterol, and zolpidem. Additionally, I am having her maintain her multivitamin, calcium carbonate, Vitamin D, aspirin, zoster vaccine live (PF), clopidogrel, ALPRAZolam, rizatriptan, levothyroxine, guaiFENesin-codeine, and rosuvastatin.  Meds ordered this encounter  Medications  . predniSONE (DELTASONE) 10 MG tablet    Sig: 6 tablets on Day 1 , then reduce by 1 tablet daily until gone    Dispense:  21 tablet    Refill:  0  . albuterol (PROVENTIL HFA;VENTOLIN HFA) 108 (90 BASE) MCG/ACT inhaler    Sig: Inhale 2 puffs into the lungs every 6 (six) hours as needed for wheezing or shortness of breath.    Dispense:  1 Inhaler    Refill:  11  . guaiFENesin-codeine 100-10 MG/5ML syrup    Sig: Take 5 mLs by mouth 3 (three) times daily as needed for cough.    Dispense:  180 mL    Refill:  0  . zolpidem (AMBIEN) 10 MG tablet    Sig: Take 1 tablet (10 mg total) by mouth at bedtime as needed for sleep.    Dispense:  30 tablet    Refill:  5  . rosuvastatin (CRESTOR) 20 MG tablet    Sig: Take 1 tablet (20 mg total) by mouth daily.    Dispense:  90 tablet    Refill:  3    Medications Discontinued During This Encounter  Medication Reason  . dexamethasone (DECADRON) 1 MG tablet Completed Course  . azithromycin (ZITHROMAX Z-PAK) 250 MG tablet   . guaiFENesin-codeine 100-10 MG/5ML syrup Reorder  . naproxen (NAPROSYN) 500 MG tablet   . zolpidem (AMBIEN) 10 MG tablet   . rosuvastatin (CRESTOR) 20 MG tablet Reorder    Follow-up: No Follow-up on file.   [name], MD

## 2015-10-24 NOTE — Patient Instructions (Addendum)
Prednisone taper for 6 days  Albuterol inhaler , take 2 puffs every 6 hours as needed for chest tightness  Use Neil Med's sinus rinse to flush sinuses once daily  Please take a probiotic ( Align, [place]ora[place] or Culturelle) for 2 weeks if you start the antibiotic to prevent a serious antibiotic associated diarrhea  Called" clostridium dificile colitis" ( should also help prevent   vaginal yeast infection)    Schedule your mammogram  Make your lab/RN appt for labs and Prevnar vaccine after the holidays

## 2015-10-24 NOTE — Progress Notes (Signed)
Pre-visit discussion using our clinic review tool. No additional management support is needed unless otherwise documented below in the visit note.  

## 2015-10-25 ENCOUNTER — Encounter: Payer: Self-pay | Admitting: Internal Medicine

## 2015-10-25 DIAGNOSIS — J441 Chronic obstructive pulmonary disease with (acute) exacerbation: Secondary | ICD-10-CM | POA: Insufficient documentation

## 2015-10-25 NOTE — Assessment & Plan Note (Signed)
Appreciate Dr Chrissie Noa evaluation and confirmation that there was indeed no cancerous cells in 2007 path report.  Suppression of TSH no longer necessary,  And LT4 dose was decreased in mid November.   Lab Results  Component Value Date   TSH 0.32* 10/07/2015

## 2015-10-25 NOTE — Assessment & Plan Note (Addendum)
Given patient's long tobacco history , hyperinflation seen on  ED's Nov 21 chest x ray and recent use of antibiotics (azithromycin,  Followed by self prescribed amoxicillin) with failure to resolve cough, I suspect she has mild COPD with exacerbation which should respond to steroids and bronchodilators.

## 2015-11-29 ENCOUNTER — Other Ambulatory Visit: Payer: BC Managed Care – PPO

## 2015-11-29 ENCOUNTER — Ambulatory Visit: Payer: BC Managed Care – PPO

## 2015-12-14 ENCOUNTER — Ambulatory Visit (INDEPENDENT_AMBULATORY_CARE_PROVIDER_SITE_OTHER): Payer: BC Managed Care – PPO

## 2015-12-14 ENCOUNTER — Other Ambulatory Visit (INDEPENDENT_AMBULATORY_CARE_PROVIDER_SITE_OTHER): Payer: BC Managed Care – PPO

## 2015-12-14 DIAGNOSIS — Z23 Encounter for immunization: Secondary | ICD-10-CM

## 2015-12-14 DIAGNOSIS — E032 Hypothyroidism due to medicaments and other exogenous substances: Secondary | ICD-10-CM | POA: Diagnosis not present

## 2015-12-14 LAB — T4, FREE: FREE T4: 1.32 ng/dL (ref 0.60–1.60)

## 2015-12-14 LAB — TSH: TSH: 1.31 u[IU]/mL (ref 0.35–4.50)

## 2015-12-14 NOTE — Progress Notes (Signed)
Patient came to office for [place].  Received in left deltoid.  Patient tolerated well.

## 2015-12-26 ENCOUNTER — Other Ambulatory Visit: Payer: Self-pay | Admitting: Internal Medicine

## 2016-01-16 ENCOUNTER — Encounter: Payer: Self-pay | Admitting: Internal Medicine

## 2016-01-16 ENCOUNTER — Ambulatory Visit (INDEPENDENT_AMBULATORY_CARE_PROVIDER_SITE_OTHER): Payer: BC Managed Care – PPO | Admitting: Internal Medicine

## 2016-01-16 VITALS — BP 108/70 | HR 94 | Temp 98.1°F | Resp 12 | Ht 67.0 in | Wt 120.8 lb

## 2016-01-16 DIAGNOSIS — G4700 Insomnia, unspecified: Secondary | ICD-10-CM

## 2016-01-16 DIAGNOSIS — R634 Abnormal weight loss: Secondary | ICD-10-CM

## 2016-01-16 DIAGNOSIS — F411 Generalized anxiety disorder: Secondary | ICD-10-CM

## 2016-01-16 DIAGNOSIS — R251 Tremor, unspecified: Secondary | ICD-10-CM | POA: Diagnosis not present

## 2016-01-16 DIAGNOSIS — F339 Major depressive disorder, recurrent, unspecified: Secondary | ICD-10-CM | POA: Diagnosis not present

## 2016-01-16 LAB — CORTISOL: CORTISOL PLASMA: 9 ug/dL

## 2016-01-16 LAB — COMPREHENSIVE METABOLIC PANEL
ALK PHOS: 110 U/L (ref 39–117)
ALT: 11 U/L (ref 0–35)
AST: 15 U/L (ref 0–37)
Albumin: 4.2 g/dL (ref 3.5–5.2)
BUN: 12 mg/dL (ref 6–23)
CHLORIDE: 104 meq/L (ref 96–112)
CO2: 27 mEq/L (ref 19–32)
Calcium: 9.7 mg/dL (ref 8.4–10.5)
Creatinine, Ser: 0.83 mg/dL (ref 0.40–1.20)
GFR: 74.39 mL/min (ref >60.00)
Glucose, Bld: 93 mg/dL (ref 70–99)
Potassium: 4.3 mEq/L (ref 3.5–5.1)
SODIUM: 141 meq/L (ref 135–145)
Total Bilirubin: 0.5 mg/dL (ref 0.2–1.2)
Total Protein: 7.1 g/dL (ref 6.0–8.3)

## 2016-01-16 MED ORDER — MIRTAZAPINE 15 MG PO TABS: 15.0000 mg | ORAL_TABLET | Freq: Every day | ORAL | Status: DC

## 2016-01-16 MED ORDER — ESCITALOPRAM OXALATE 10 MG PO TABS: 10.0000 mg | ORAL_TABLET | Freq: Every day | ORAL | Status: DC

## 2016-01-16 NOTE — Progress Notes (Signed)
Pre-visit discussion using our clinic review tool. No additional management support is needed unless otherwise documented below in the visit note.  

## 2016-01-16 NOTE — Progress Notes (Signed)
Subjective:  Patient ID: Tracie Morris, female    DOB: 1956-02-14  Age: 60 y.o. MRN: [id number]  CC: The primary encounter diagnosis was Tremor. Diagnoses of Loss of weight, Episode of recurrent major depressive disorder, unspecified depression episode severity ([place]), Tremulousness, Insomnia, persistent, and GAD (generalized anxiety disorder) were also pertinent to this visit.  HPI Tracie Morris presents for new onset tremors accompanied by feeling depressed.   Right hand has had a coarse tremor  for the last year.  For the last several months has been having episodes of  feeling panicky, wakes up fine,  Starts to feel shaky and heart races  Before she goe to work.  Avoids caffeine.  Drinks 1-2 boosts every morning.    insomnia started first.  Insomnia:  trouble with initiation,  Averages 4 hours sleep per night.  Taking ambien,  But chart says ambien causes anxiety and trembling .  Stopped the [place] a week ago due to decreased memory and trouble concentrating at her job.   These symptoms have improved with stopping ambien,  So she is using alprazolam  At night 0.5 mg and occasionally 1/2 tablet during the day if she feel tremulous. No actual panic attack described:  Symptoms start with heart rate speeding up ,  Never > 150,.  No SOB aor dizziness.  Just tremulousness . Lots of work stressor , takes work home with her often. "everything is getting on my nerves". ,  Short fuse  Some days feels depressed not on HRT since her stent was placed . Has occasional hot flashes if she misses her thyroid dose. No appetite,  Has lost weight . Averaging only one meal daily and supplementing with Ensure.     History of adrenal incidentaloma found on CT from 3/16 .  Screening labs for hyperfunction were checked by Dr. [name] and were normal.    History of thyroid resection for suspicion of thyroid CA in 2007  ([place])  Last TSH WNL Jan 2017    Lab Results  Component Value Date   TSH 1.31  12/14/2015     Outpatient Prescriptions Prior to Visit  Medication Sig Dispense Refill  . albuterol (PROVENTIL HFA;VENTOLIN HFA) 108 (90 BASE) MCG/ACT inhaler Inhale 2 puffs into the lungs every 6 (six) hours as needed for wheezing or shortness of breath. 1 Inhaler 11  . ALPRAZolam (XANAX) 0.5 MG tablet TAKE ONE TABLET AT BEDTIME AS NEEDED FORANXIETY 30 tablet 2  . aspirin 81 MG tablet Take 1 tablet (81 mg total) by mouth daily. 30 tablet   . calcium carbonate (OS-CAL) 600 MG TABS Take 1,200 mg by mouth daily with breakfast.     . Cholecalciferol (VITAMIN D) 2000 UNITS tablet Take 2,000 Units by mouth daily.    . clopidogrel (PLAVIX) 75 MG tablet Take 1 tablet (75 mg total) by mouth once. ONE EVERY 5 DAYS 30 tablet 6  . levothyroxine (SYNTHROID, LEVOTHROID) 112 MCG tablet Take 1 tablet (112 mcg total) by mouth daily. 45 tablet 1  . Multiple Vitamin (MULTIVITAMIN) tablet Take 1 tablet by mouth daily.      . rizatriptan (MAXALT-MLT) 10 MG disintegrating tablet TAKE 1 TABLET BY MOUTH AS NEEDED MAY REPEAT IN 2 HOURS IF NEEDED 10 tablet 0  . rosuvastatin (CRESTOR) 20 MG tablet Take 1 tablet (20 mg total) by mouth daily. 90 tablet 3  . zolpidem (AMBIEN) 10 MG tablet Take 1 tablet (10 mg total) by mouth at bedtime as needed for  sleep. 30 tablet 5  . zoster vaccine live, PF, (ZOSTAVAX) 45038 UNT/0.65ML injection Inject 19,400 Units into the skin once. 1 each 0  . guaiFENesin-codeine 100-10 MG/5ML syrup Take 5 mLs by mouth 3 (three) times daily as needed for cough. (Patient not taking: Reported on 01/16/2016) 180 mL 0  . predniSONE (DELTASONE) 10 MG tablet 6 tablets on Day 1 , then reduce by 1 tablet daily until gone (Patient not taking: Reported on 01/16/2016) 21 tablet 0   No facility-administered medications prior to visit.    Review of Systems;  Patient denies headache, fevers, malaise, unintentional weight loss, skin rash, eye pain, sinus congestion and sinus pain, sore throat, dysphagia,   hemoptysis , cough, dyspnea, wheezing, chest pain, palpitations, orthopnea, edema, abdominal pain, nausea, melena, diarrhea, constipation, flank pain, dysuria, hematuria, urinary  Frequency, nocturia, numbness, tingling, seizures,  Focal weakness, Loss of consciousness,  Tremor, insomnia, depression, anxiety, and suicidal ideation.      Objective:  BP 108/70 mmHg  Pulse 94  Temp(Src) 98.1 F (36.7 C) (Oral)  Resp 12  Ht 5' 7"  (1.702 m)  Wt 120 lb 12 oz (54.772 kg)  BMI 18.91 kg/m2  SpO2 98%  BP Readings from Last 3 Encounters:  01/16/16 108/70  10/24/15 122/70  10/17/15 126/57    Wt Readings from Last 3 Encounters:  01/16/16 120 lb 12 oz (54.772 kg)  10/24/15 124 lb 4 oz (56.359 kg)  10/17/15 122 lb (55.339 kg)    General appearance: alert, cooperative and appears stated age Ears: normal TM's and external ear canals both ears Throat: lips, mucosa, and tongue normal; teeth and gums normal Neck: no adenopathy, no carotid bruit, supple, symmetrical, trachea midline and thyroid not enlarged, symmetric, no tenderness/mass/nodules Back: symmetric, no curvature. ROM normal. No CVA tenderness. Lungs: clear to auscultation bilaterally Heart: regular rate and rhythm, S1, S2 normal, no murmur, click, rub or gallop Abdomen: soft, non-tender; bowel sounds normal; no masses,  no organomegaly Pulses: 2+ and symmetric Skin: Skin color, texture, turgor normal. No rashes or lesions Lymph nodes: Cervical, supraclavicular, and axillary nodes normal.  No results found for: HGBA1C  Lab Results  Component Value Date   CREATININE 0.83 01/16/2016   CREATININE 0.97 02/17/2015   CREATININE 0.8 08/30/2014    Lab Results  Component Value Date   WBC 9.7 02/17/2015   HGB 15.7 02/17/2015   HCT 45.4 02/17/2015   PLT 207 02/17/2015   GLUCOSE 93 01/16/2016   CHOL 145 07/11/2015   TRIG 103.0 07/11/2015   HDL 39.50 07/11/2015   LDLCALC 85 07/11/2015   ALT 11 01/16/2016   AST 15 01/16/2016    NA 141 01/16/2016   K 4.3 01/16/2016   CL 104 01/16/2016   CREATININE 0.83 01/16/2016   BUN 12 01/16/2016   CO2 27 01/16/2016   TSH 1.31 12/14/2015   INR 0.96 10/14/2013   MICROALBUR 0.6 01/11/2014    Dg Chest 2 View  10/17/2015  CLINICAL DATA:  Productive cough, congestion for 3 days EXAM: CHEST  2 VIEW COMPARISON:  02/17/2015 FINDINGS: Cardiomediastinal silhouette is stable. Mild hyperinflation. No acute infiltrate or pleural effusion. No pulmonary edema. Bony thorax is unremarkable. IMPRESSION: No active cardiopulmonary disease.  Mild hyperinflation. Electronically Signed   By: Lahoma Crocker M.D.   On: 10/17/2015 10:11    Assessment & Plan:   Problem List Items Addressed This Visit    Insomnia, persistent    dc'd ambien.  Adding remeron.       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  Repeat adrenal screening tests with 24 hr urine collection for catecholamines.  Thyroid functio normnal.  Adding remeron for weight loss, anorexia, and insomnia.       GAD (generalized anxiety disorder)    Mixed with depression,  Weight loss,  Irritability and fatigue,  Adding remeron,  Already using alprazolam.The risks and benefits of benzodiazepine use were reviewed with patient today including excessive sedation leading to respiratory depression,  impaired thinking/driving, and addiction.  Patient was advised to avoid concurrent use with alcohol, to use medication only as needed and not to share with others  .  May resume lexapro in two week,  Return in one month        Other Visit Diagnoses    Tremor    -  Primary    Relevant Orders    Catecholamines, fractionated, Urine, 24 hour    Metanephrines, Urine, 24 hour    Aldosterone + renin activity w/ ratio    Cortisol (Completed)    Loss of weight        Relevant Orders    Catecholamines, fractionated, Urine, 24 hour    Metanephrines, Urine, 24 hour    Aldosterone + renin activity w/ ratio    Comp Met (CMET) (Completed)    Episode of  recurrent major depressive disorder, unspecified depression episode severity ([facility])        Relevant Medications    mirtazapine (REMERON) 15 MG tablet    escitalopram (LEXAPRO) 10 MG tablet       I am having Ms. Yaworski start on mirtazapine and escitalopram. I am also having her maintain her multivitamin, calcium carbonate, Vitamin D, aspirin, zoster vaccine live (PF), clopidogrel, rizatriptan, levothyroxine, predniSONE, albuterol, guaiFENesin-codeine, zolpidem, rosuvastatin, and ALPRAZolam.  Meds ordered this encounter  Medications  . mirtazapine (REMERON) 15 MG tablet    Sig: Take 1 tablet (15 mg total) by mouth at bedtime.    Dispense:  30 tablet    Refill:  5  . escitalopram (LEXAPRO) 10 MG tablet    Sig: Take 1 tablet (10 mg total) by mouth daily.    Dispense:  3 tablet    Refill:  5   A total of 40 minutes was spent with patient more than half of which was spent in counseling patient on the above mentioned issues , reviewing and explaining recent labs and imaging studies done, and coordination of care. There are no discontinued medications.  Follow-up: No Follow-up on file.   [name], MD

## 2016-01-16 NOTE — Patient Instructions (Addendum)
I am starting you on generic Remeron (mirtazipine) TO TAKE AT BEDTIME  FOR INSOMNIA,  AND LOSS OF APPETITE  Start with 1/2 tablet for the first week,  Then increase to full tablet   Add the Lexapro in 2 weeks if no improvement   We will order the 24 hour urine collection  to test for adrenal hypersecretion , and screen for low cortisol levels toda

## 2016-01-17 DIAGNOSIS — F411 Generalized anxiety disorder: Secondary | ICD-10-CM | POA: Insufficient documentation

## 2016-01-17 DIAGNOSIS — R251 Tremor, unspecified: Secondary | ICD-10-CM | POA: Insufficient documentation

## 2016-01-17 NOTE — Assessment & Plan Note (Signed)
dc'd [place].  Adding remeron.

## 2016-01-17 NOTE — Assessment & Plan Note (Signed)
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  Repeat adrenal screening tests with 24 hr urine collection for catecholamines.  Thyroid functio normnal.  Adding remeron for weight loss, anorexia, and insomnia.

## 2016-01-17 NOTE — Assessment & Plan Note (Signed)
Mixed with depression,  Weight loss,  Irritability and fatigue,  Adding remeron,  Already using alprazolam.The risks and benefits of benzodiazepine use were reviewed with patient today including excessive sedation leading to respiratory depression,  impaired thinking/driving, and addiction.  Patient was advised to avoid concurrent use with alcohol, to use medication only as needed and not to share with others  .  May resume lexapro in two week,  Return in one month

## 2016-01-18 ENCOUNTER — Encounter: Payer: Self-pay | Admitting: Internal Medicine

## 2016-01-21 LAB — ALDOSTERONE + RENIN ACTIVITY W/ RATIO
ALDO / PRA RATIO: 6.7 ratio (ref 0.9–28.9)
Aldosterone: 11 ng/dL
PRA LC/MS/MS: 1.63 ng/mL/h (ref 0.25–5.82)

## 2016-01-22 ENCOUNTER — Encounter: Payer: Self-pay | Admitting: Internal Medicine

## 2016-02-06 ENCOUNTER — Other Ambulatory Visit: Payer: Self-pay | Admitting: *Deleted

## 2016-02-06 DIAGNOSIS — R251 Tremor, unspecified: Secondary | ICD-10-CM

## 2016-02-06 DIAGNOSIS — R634 Abnormal weight loss: Secondary | ICD-10-CM

## 2016-02-10 ENCOUNTER — Encounter: Payer: Self-pay | Admitting: Internal Medicine

## 2016-02-10 LAB — METANEPHRINES, URINE, 24 HOUR
METANEPH TOTAL UR: 179 ug/(24.h) — AB (ref 224–832)
METANEPHRINES UR: 51 ug/(24.h) — AB (ref 90–315)
Normetanephrine, 24H Ur: 128 mcg/24 h (ref 122–676)

## 2016-02-10 LAB — CATECHOLAMINES, FRACTIONATED, URINE, 24 HOUR
CREATININE, URINE MG/DAY-CATEUR: 0.65 g/(24.h) (ref 0.63–2.50)
Calculated Total (E+NE): 19 mcg/24 h — ABNORMAL LOW (ref 26–121)
Dopamine, 24 hr Urine: 116 mcg/24 h (ref 52–480)
NOREPINEPHRINE, 24 HR UR: 19 ug/(24.h) (ref 15–100)
Total Volume - CF 24Hr U: 1400 mL

## 2016-02-16 ENCOUNTER — Encounter: Payer: Self-pay | Admitting: Cardiovascular Disease

## 2016-02-16 ENCOUNTER — Ambulatory Visit (INDEPENDENT_AMBULATORY_CARE_PROVIDER_SITE_OTHER): Payer: BC Managed Care – PPO | Admitting: Cardiovascular Disease

## 2016-02-16 VITALS — BP 120/62 | HR 84 | Ht 67.5 in | Wt 120.5 lb

## 2016-02-16 DIAGNOSIS — R0600 Dyspnea, unspecified: Secondary | ICD-10-CM | POA: Diagnosis not present

## 2016-02-16 DIAGNOSIS — R0789 Other chest pain: Secondary | ICD-10-CM

## 2016-02-16 NOTE — Progress Notes (Signed)
Cardiology Office Note   Date:  02/16/2016   ID:  Tracie Morris, DOB 15-Feb-1956, MRN [id number]  PCP:  [name], MD  Cardiologist:   [name], MD   Chief Complaint  Patient presents with  . Other    6 month f/u. Meds reviewed verbally with pt. "doing well."       History of Present Illness: Tracie Morris is a 60 y.o. female who presents for a followup visit regarding peripheral arterial disease.  She is status post left common iliac artery stent placement in November of 2014 for significant claudication. Other Medical history include previous tobacco use and hyperlipidemia  No significant claudication. No chest pain.  She reported worsening dyspnea during last visit which has resolved without intervention. She is doing well overall but she complains of being anxious.  Past Medical History  Diagnosis Date  . Hypocalcemia   . Migraine     improved since thyroid surgery, 2 years  . multinodular goiter 2007    s/p thyroidectomy  . Hyperlipidemia   . hypothyroidism 2007    acquired, secondary to surgery for thyroid Ca  . Carotid artery occlusion   . 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 Repeat angiography in November of 2014 showed 50% distal aortic stenosis extending into the left common iliac artery which had 60-70% ostial stenosis. No other significant disease. I placed on balloon expandable stent into  left common iliac artery extending slightly into the distal aorta    Past Surgical History  Procedure Laterality Date  . Thyroid surgery  2007    secondary to thyroid cancer  . Angioplasty illiac artery    . Cesarean section    . Nose surgery    . Skin cancer excision    . Abdominal aortagram N/A 10/14/2013    Procedure: ABDOMINAL [name];  Surgeon: Wellington Hampshire, MD;  Location: [facility] CATH LAB;  Service: Cardiovascular;  Laterality: N/A;      Current Outpatient Prescriptions  Medication Sig Dispense Refill  . ALPRAZolam (XANAX) 0.5 MG tablet TAKE ONE TABLET AT BEDTIME AS NEEDED FORANXIETY 30 tablet 2  . aspirin 81 MG tablet Take 1 tablet (81 mg total) by mouth daily. 30 tablet   . clopidogrel (PLAVIX) 75 MG tablet Take 1 tablet (75 mg total) by mouth once. ONE EVERY 5 DAYS 30 tablet 6  . escitalopram (LEXAPRO) 10 MG tablet Take 1 tablet (10 mg total) by mouth daily. 3 tablet 5  . levothyroxine (SYNTHROID, LEVOTHROID) 112 MCG tablet Take 1 tablet (112 mcg total) by mouth daily. 45 tablet 1  . rizatriptan (MAXALT-MLT) 10 MG disintegrating tablet TAKE 1 TABLET BY MOUTH AS NEEDED MAY REPEAT IN 2 HOURS IF NEEDED 10 tablet 0  . rosuvastatin (CRESTOR) 20 MG tablet Take 1 tablet (20 mg total) by mouth daily. 90 tablet 3  . zolpidem (AMBIEN) 10 MG tablet Take 1 tablet (10 mg total) by mouth at bedtime as needed for sleep. 30 tablet 5   No current facility-administered medications for this visit.    Allergies:   Atorvastatin; Darvocet; Demerol; Methocarbamol; and Ambien    Social History:  The patient  reports that she quit smoking about 4 years ago. Her smoking use included Cigarettes. She has a 20 pack-year smoking history. She has never used smokeless tobacco. She reports that she does not drink alcohol or use illicit drugs.  Family History:  The patient's family history includes Alzheimer's disease in her father; Coronary artery disease in her maternal grandmother; Coronary artery disease (age of onset: 55) in her paternal grandmother; Heart disease (age of onset: 70) in her maternal grandmother; Heart disease (age of onset: 19) in her paternal grandmother; Stroke in her mother.    ROS:  Please see the history of present illness.   Otherwise, review of systems are positive for none.   All other systems are reviewed and negative.    PHYSICAL EXAM: VS:  BP 120/62 mmHg  Pulse 84  Ht 5' 7.5" (1.715 m)  Wt 120 lb 8 oz (54.658  kg)  BMI 18.58 kg/m2 , BMI Body mass index is 18.58 kg/(m^2). GEN: Well nourished, well developed, in no acute distress HEENT: normal Neck: no JVD, carotid bruits, or masses Cardiac: RRR; no murmurs, rubs, or gallops,no edema  Respiratory:  clear to auscultation bilaterally, normal work of breathing GI: soft, nontender, nondistended, + BS MS: no deformity or atrophy Skin: warm and dry, no rash Neuro:  Strength and sensation are intact Psych: euthymic mood, full affect   EKG:  EKG is ordered today. The ekg ordered today demonstrates normal sinus rhythm with no significant ST or T wave changes.   Recent Labs: 02/17/2015: HGB 15.7; Platelet 207 12/14/2015: TSH 1.31 01/16/2016: ALT 11; BUN 12; Creatinine, Ser 0.83; Potassium 4.3; Sodium 141    Lipid Panel    Component Value Date/Time   CHOL 145 07/11/2015 0949   TRIG 103.0 07/11/2015 0949   HDL 39.50 07/11/2015 0949   CHOLHDL 4 07/11/2015 0949   VLDL 20.6 07/11/2015 0949   LDLCALC 85 07/11/2015 0949      Wt Readings from Last 3 Encounters:  02/16/16 120 lb 8 oz (54.658 kg)  01/16/16 120 lb 12 oz (54.772 kg)  10/24/15 124 lb 4 oz (56.359 kg)        ASSESSMENT AND PLAN:  1.  Peripheral arterial disease: Status post left common iliac artery stent placement. Most recent ABI was normal and duplex showed borderline disease in the bilateral common iliac arteries with velocity slightly above 200. She has no claudication. I recommend continuing medical therapy. She continues to use Plavix as needed for migraines.  2. Hyperlipidemia: Continue treatment with rosuvastatin. Most recent lipid profile showed an LDL of 85.    Disposition:   FU with me in 1 year  Signed,  [name], MD  02/16/2016 8:50 AM    [place]

## 2016-02-16 NOTE — Patient Instructions (Signed)
Medication Instructions: Continue same medications.   Labwork: None.   Procedures/Testing: None.   Follow-Up: 1 year with Dr. Janese Radabaugh.   Any Additional Special Instructions Will Be Listed Below (If Applicable).     If you need a refill on your cardiac medications before your next appointment, please call your pharmacy.   

## 2016-02-20 ENCOUNTER — Ambulatory Visit (INDEPENDENT_AMBULATORY_CARE_PROVIDER_SITE_OTHER): Payer: BC Managed Care – PPO | Admitting: Internal Medicine

## 2016-02-20 ENCOUNTER — Encounter: Payer: Self-pay | Admitting: Internal Medicine

## 2016-02-20 VITALS — BP 110/68 | HR 86 | Temp 98.3°F | Resp 12 | Wt 122.0 lb

## 2016-02-20 DIAGNOSIS — E032 Hypothyroidism due to medicaments and other exogenous substances: Secondary | ICD-10-CM

## 2016-02-20 DIAGNOSIS — E279 Disorder of adrenal gland, unspecified: Secondary | ICD-10-CM

## 2016-02-20 DIAGNOSIS — E278 Other specified disorders of adrenal gland: Secondary | ICD-10-CM

## 2016-02-20 LAB — T4, FREE: Free T4: 1.33 ng/dL (ref 0.60–1.60)

## 2016-02-20 LAB — TSH: TSH: 1.81 u[IU]/mL (ref 0.35–4.50)

## 2016-02-20 MED ORDER — LEVOTHYROXINE SODIUM 112 MCG PO TABS: 112.0000 ug | ORAL_TABLET | Freq: Every day | ORAL | Status: DC

## 2016-02-20 NOTE — Progress Notes (Signed)
Patient ID: Tracie Morris, female   DOB: 23-Feb-1956, 60 y.o.   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 Last visit 6 mo ago.  Pt's adrenal mass was incidentally found during investigation for kidney stones in 01/2015.  I reviewed pt's previous abdominal CT from 06/2015: Left adrenal nodule of 9 mm measuring -11 HU.  Of note, patient had a PET scan on 08/02/2015 and this nodule was not hypermetabolic.  No h/o hypokalemia, HTN, hyperglycemia, spells of HA + HTN. She does have a h/o migraines, relieved by prn Plavix (!).   Reviewed previous investigations along with pt >> plasma normetanephrines are slightly elevated 6 months ago, however, urine metanephrines, normetanephrines and catecholamines were normal 2 weeks ago. Plasma reading activity, aldosterone, and dexamethasone suppression test were normal:  Component     Latest Ref Rng 08/23/2015 08/31/2015 01/16/2016 02/06/2016  Total Volume - CF 24Hr U         1400  Epinephrine, 24 hr Urine         REPORT  Norepinephrine, 24 hr Ur     15 - 100 mcg/24 h    19  Calculated Total (E+NE)     26 - 121 mcg/24 h    19 (L)  Dopamine, 24 hr Urine     52 - 480 mcg/24 h    116  Creatinine, Urine mg/day-CATEUR     0.63 - 2.50 g/24 h    0.65  Epinephrine      REPORT     Norepinephrine      534     Dopamine      REPORT     Catecholamines, Total      534     PRA LC/MS/MS     0.25 - 5.82 ng/mL/h 0.47  1.63   ALDO / PRA Ratio     0.9 - 28.9 Ratio 6.4  6.7   ALDOSTERONE      3  11   Metanephrine, Pl     <=57 pg/mL 38     Normetanephrine, Pl     <=148 pg/mL 151 (H)     Total Metanephrines-Plasma     <=205 pg/mL 189     Metanephrines, Ur     90 - 315 mcg/24 h    51 (L)  Normetanephr.,U,24h     122 - 676 mcg/24 h    128  Metanephrine, Ur     224 - 832 mcg/24 h    179 (L)  Potassium     3.5 - 5.1 mEq/L 4.0     Cortisol,  Plasma       1.2 9.0    Postsurgical hypothyroidism: Patient had total thyroidectomy in 2007 for presumed thyroid cancer - Dr Carlis Abbott (ENT); 1 parathyroid removed. Final pathology after thyroidectomy >> benign.   - She is on levothyroxine 175 >> 150 >> 125 >> 112 mcg in 09/2015 >> labs subsequently normalized.  - She had fluctuating thyroid tests, with the latest TSH levels: Lab Results  Component Value Date   TSH 1.31 12/14/2015   TSH 0.32* 10/07/2015   TSH 0.016* 08/31/2015   TSH 0.10* 08/31/2015   TSH 0.22* 08/23/2015   TSH 0.011* 07/11/2015   FREET4 1.32 12/14/2015   FREET4 1.53 10/07/2015   FREET4 1.20 08/31/2015   FREET4 1.56 08/23/2015   FREET4 1.39 07/07/2013   She is taking the levothyroxine: - Daily, not  skipping doses - Fasting - In a.m. - With water - Eats breakfast more than 30 minutes later - No PPI, calcium, iron - No multivitamins  She had a stent placed >> Seeing Dr Fletcher Anon.   ROS: Constitutional:no weight loss, no fatigue, no subjective hyperthermia, no poor sleep Eyes: no blurry vision, no xerophthalmia ENT: no sore throat, no nodules palpated in throat, no dysphagia/odynophagia, no hoarseness Cardiovascular: no CP/+ SOB/no palpitations/leg swelling Respiratory: no cough/+ SOB Gastrointestinal: no N/V/D/C Musculoskeletal: no muscle/joint aches Skin: no rashes Neurological: + tremors - R > L hand/no numbness/tingling/dizziness  I reviewed pt's medications, allergies, PMH, social hx, family hx, and changes were documented in the history of present illness. Otherwise, unchanged from my initial visit note.  Past Medical History  Diagnosis Date  . Hypocalcemia   . Migraine     improved since thyroid surgery, 2 years  . multinodular goiter 2007    s/p thyroidectomy  . Hyperlipidemia   . hypothyroidism 2007    acquired, secondary to surgery for thyroid Ca  . Carotid artery occlusion   . Peripheral arterial disease (HCC)     Left common iliac artery  occlusion status post balloon angioplasty without stent placement in January of 2014. Repeat angiography in November of 2014 showed 50% distal aortic stenosis extending into the left common iliac artery which had 60-70% ostial stenosis. No other significant disease. I placed on balloon expandable stent into  left common iliac artery extending slightly into the distal aorta   Past Surgical History  Procedure Laterality Date  . Thyroid surgery  2007    secondary to thyroid cancer  . Angioplasty illiac artery    . Cesarean section    . Nose surgery    . Skin cancer excision    . Abdominal aortagram N/A 10/14/2013    Procedure: ABDOMINAL [name];  Surgeon: Wellington Hampshire, MD;  Location: [facility] CATH LAB;  Service: Cardiovascular;  Laterality: N/A;   Social History   Social History  . Marital Status: Married    Spouse Name: N/A  . Number of Children: 1   Occupational History  . Manager    Social History Main Topics  . Smoking status: Former Smoker -- 1.00 packs/day for 20 years    Types: Cigarettes    Quit date: 10/28/2011  . Smokeless tobacco: Never Used  . Alcohol Use: No  . Drug Use: No   Current Outpatient Prescriptions on File Prior to Visit  Medication Sig Dispense Refill  . ALPRAZolam (XANAX) 0.5 MG tablet TAKE ONE TABLET AT BEDTIME AS NEEDED FORANXIETY 30 tablet 2  . aspirin 81 MG tablet Take 1 tablet (81 mg total) by mouth daily. 30 tablet   . clopidogrel (PLAVIX) 75 MG tablet Take 1 tablet (75 mg total) by mouth once. ONE EVERY 5 DAYS 30 tablet 6  . escitalopram (LEXAPRO) 10 MG tablet Take 1 tablet (10 mg total) by mouth daily. 3 tablet 5  . levothyroxine (SYNTHROID, LEVOTHROID) 112 MCG tablet Take 1 tablet (112 mcg total) by mouth daily. 45 tablet 1  . rizatriptan (MAXALT-MLT) 10 MG disintegrating tablet TAKE 1 TABLET BY MOUTH AS NEEDED MAY REPEAT IN 2 HOURS IF NEEDED 10 tablet 0  . rosuvastatin (CRESTOR) 20 MG tablet Take 1 tablet (20 mg total) by mouth daily. 90 tablet 3   . zolpidem (AMBIEN) 10 MG tablet Take 1 tablet (10 mg total) by mouth at bedtime as needed for sleep. 30 tablet 5   No current facility-administered medications on  file prior to visit.   Allergies  Allergen Reactions  . Atorvastatin Other (See Comments)    Myalgias   . Darvocet [Propoxyphene N-Acetaminophen] Nausea And Vomiting  . Demerol Nausea And Vomiting  . Methocarbamol Other (See Comments)  . Ambien [Zolpidem Tartrate] Palpitations    Anxiety , trembling.   Family History  Problem Relation Age of Onset  . Coronary artery disease Maternal Grandmother   . Heart disease Maternal Grandmother 70  . Coronary artery disease Paternal Grandmother 16  . Heart disease Paternal Grandmother 25  . Alzheimer's disease Father   . Stroke Mother    PE: BP 110/68 mmHg  Pulse 86  Temp(Src) 98.3 F (36.8 C) (Oral)  Resp 12  Wt 122 lb (55.339 kg)  SpO2 96% Body mass index is 18.81 kg/(m^2). Wt Readings from Last 3 Encounters:  02/20/16 122 lb (55.339 kg)  02/16/16 120 lb 8 oz (54.658 kg)  01/16/16 120 lb 12 oz (54.772 kg)   Constitutional: thin, in NAD Eyes: PERRLA, EOMI ENT: moist mucous membranes, no neck masses, no cervical lymphadenopathy Cardiovascular: RRR, No MRG Respiratory: CTA B Gastrointestinal: abdomen soft, NT, ND, BS+ Musculoskeletal: no deformities, strength intact in all 4 Skin: moist, warm, no rashes Neurological: + tremor with outstretched R hand, DTR normal in all 4  ASSESSMENT: 1. Adrenal incidentaloma  2. Postsurgical hypothyroidism  3. 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 Right thyroid: Nodular hyperplasia B. Left thyroid: Nodular hyperplasia with 1.9 cm dominant adenomatoid nodule. Parathyroid tissue in a single section, measuring 0.7 mm. Conclusion: There is nodular hyperplasia (multinodular goiter). There is no evidence of malignancy.   PLAN:   1. Patient with a L 9 mm adrenal nodule discovered incidentally. - We discussed about the fact that there are 3 possible scenarios - previous hormonal workup and imaging have been indicating a benign, nonsecreting nodule - To differentiate between a functioning and a nonfunctioning adrenal nodule, we need to rule out hypersecretion by checking the following tests  - dexamethasone suppression test to rule out Cushing syndrome (6% of adrenal incidentalomas). If the cortisol level returns >5, will need 24h urine free cortisol. She had one suppressed free cortisol so far. We'll repeat in one year. - Plasma fractionated metanephrines and catecholamines to rule out pheochromocytoma (3% of adrenal incidentalomas). Normal so far. - Plasma renin activity and aldosterone level to rule out primary hyperaldosteronism (0.6% of adrenal incidentalomas). Normal so far. - we discussed about f/u:   hormonal testing yearly for 5 years - she had 2x sets - will repeat in 1 year  CT scans yearly x1-2 - she will need a new adrenal CT at the time of her next pulm CT (to follow a lung nodule) - this summer - I explained all the above to the patient, and she agrees with the plan. - I will see her back in 1 year  2. Hypothyroidism - TSH normalized after changing the levothyroxine dose to 112 g daily in 09/2015. She is feeling well on this dose.  -  She is taking the thyroid hormone correctly: every day, with water, >30 minutes before breakfast, separated by >4 hours from acid reflux medications, calcium, iron, multivitamins. She is taking it correctly. - check TFTs today - Needs refills for LT4 - 3 mo.  Office Visit on 02/20/2016  Component Date Value Ref Range Status  . Free T4 02/20/2016 1.33  0.60 - 1.60 ng/dL  Final  . TSH 02/20/2016 1.81  0.35 - 4.50 uIU/mL Final   Normal TFTs.

## 2016-02-20 NOTE — Patient Instructions (Addendum)
Please stop at the lab.  Please let me know about the CT scan of the chest so I can order the adrenal CT.  Continue Levothyroxine 112 mcg daily.  Take the thyroid hormone every day, with water, at least 30 minutes before breakfast, separated by at least 4 hours from: - acid reflux medications - calcium - iron - multivitamins  Please return in 1 year.

## 2016-04-09 ENCOUNTER — Encounter: Payer: Self-pay | Admitting: Internal Medicine

## 2016-04-09 ENCOUNTER — Ambulatory Visit (INDEPENDENT_AMBULATORY_CARE_PROVIDER_SITE_OTHER): Payer: BC Managed Care – PPO | Admitting: Internal Medicine

## 2016-04-09 ENCOUNTER — Other Ambulatory Visit (HOSPITAL_COMMUNITY)
Admission: RE | Admit: 2016-04-09 | Discharge: 2016-04-09 | Disposition: A | Discharge status: Home / Self Care | Payer: BC Managed Care – PPO | Source: Ambulatory Visit | Attending: Internal Medicine | Admitting: Internal Medicine

## 2016-04-09 VITALS — BP 122/70 | HR 81 | Temp 98.2°F | Ht 67.5 in | Wt 125.0 lb

## 2016-04-09 DIAGNOSIS — E279 Disorder of adrenal gland, unspecified: Secondary | ICD-10-CM

## 2016-04-09 DIAGNOSIS — Z01419 Encounter for gynecological examination (general) (routine) without abnormal findings: Secondary | ICD-10-CM | POA: Insufficient documentation

## 2016-04-09 DIAGNOSIS — E785 Hyperlipidemia, unspecified: Secondary | ICD-10-CM

## 2016-04-09 DIAGNOSIS — R911 Solitary pulmonary nodule: Secondary | ICD-10-CM

## 2016-04-09 DIAGNOSIS — Z79899 Other long term (current) drug therapy: Secondary | ICD-10-CM

## 2016-04-09 DIAGNOSIS — Z7289 Other problems related to lifestyle: Secondary | ICD-10-CM | POA: Diagnosis not present

## 2016-04-09 DIAGNOSIS — F411 Generalized anxiety disorder: Secondary | ICD-10-CM | POA: Diagnosis not present

## 2016-04-09 DIAGNOSIS — Z124 Encounter for screening for malignant neoplasm of cervix: Secondary | ICD-10-CM

## 2016-04-09 DIAGNOSIS — E278 Other specified disorders of adrenal gland: Secondary | ICD-10-CM

## 2016-04-09 DIAGNOSIS — Z0000 Encounter for general adult medical examination without abnormal findings: Secondary | ICD-10-CM | POA: Diagnosis not present

## 2016-04-09 LAB — COMPREHENSIVE METABOLIC PANEL
ALT: 16 U/L (ref 0–35)
AST: 20 U/L (ref 0–37)
Albumin: 4.4 g/dL (ref 3.5–5.2)
Alkaline Phosphatase: 120 U/L — ABNORMAL HIGH (ref 39–117)
BILIRUBIN TOTAL: 0.6 mg/dL (ref 0.2–1.2)
BUN: 14 mg/dL (ref 6–23)
CO2: 30 meq/L (ref 19–32)
CREATININE: 0.76 mg/dL (ref 0.40–1.20)
Calcium: 9.6 mg/dL (ref 8.4–10.5)
Chloride: 103 mEq/L (ref 96–112)
GFR: 82.29 mL/min (ref >60.00)
GLUCOSE: 90 mg/dL (ref 70–99)
Potassium: 4.3 mEq/L (ref 3.5–5.1)
Sodium: 140 mEq/L (ref 135–145)
Total Protein: 7.1 g/dL (ref 6.0–8.3)

## 2016-04-09 LAB — LIPID PANEL
CHOL/HDL RATIO: 3
Cholesterol: 170 mg/dL (ref 0–200)
HDL: 60.8 mg/dL (ref >39.00)
LDL Cholesterol: 96 mg/dL (ref 0–99)
NONHDL: 109.39
Triglycerides: 68 mg/dL (ref 0.0–149.0)
VLDL: 13.6 mg/dL (ref 0.0–40.0)

## 2016-04-09 LAB — HIV ANTIBODY (ROUTINE TESTING W REFLEX): HIV: NONREACTIVE

## 2016-04-09 MED ORDER — PAROXETINE HCL 10 MG PO TABS: 10.0000 mg | ORAL_TABLET | Freq: Every day | ORAL | Status: DC

## 2016-04-09 MED ORDER — IBUPROFEN 600 MG PO TABS: 600.0000 mg | ORAL_TABLET | Freq: Three times a day (TID) | ORAL | Status: DC | PRN

## 2016-04-09 NOTE — Patient Instructions (Addendum)
You need your mammogram done .call if you have trouble ordering  Your  mammogram since it  was ordered last November.   We will coordinate the CT's of lung and adrenals  with Dr [name]   I suggest using ibuprofen 600 mg every  Hours as needed plus tylenol 500 mg (to to 4 times daily)   We are changing lexapro to paxil for your anxiety .  10 mg to start with.   Stop the lexapro when you start the paxil.   Increase to 20 mg after one week  Menopause is a normal process in which your reproductive ability comes to an end. This process happens gradually over a span of months to years, usually between the ages of 73 and 71. Menopause is complete when you have missed 12 consecutive menstrual periods. It is important to talk with your health care provider about some of the most common conditions that affect postmenopausal women, such as heart disease, cancer, and bone loss (osteoporosis). Adopting a healthy lifestyle and getting preventive care can help to promote your health and wellness. Those actions can also lower your chances of developing some of these common conditions. WHAT SHOULD I KNOW ABOUT MENOPAUSE? During menopause, you may experience a number of symptoms, such as:  Moderate-to-severe hot flashes.  Night sweats.  Decrease in sex drive.  Mood swings.  Headaches.  Tiredness.  Irritability.  Memory problems.  Insomnia. Choosing to treat or not to treat menopausal changes is an individual decision that you make with your health care provider. WHAT SHOULD I KNOW ABOUT HORMONE REPLACEMENT THERAPY AND SUPPLEMENTS? Hormone therapy products are effective for treating symptoms that are associated with menopause, such as hot flashes and night sweats. Hormone replacement carries certain risks, especially as you become older. If you are thinking about using estrogen or estrogen with progestin treatments, discuss the benefits and risks with your health care provider. WHAT SHOULD I KNOW ABOUT  HEART DISEASE AND STROKE? Heart disease, heart attack, and stroke become more likely as you age. This may be due, in part, to the hormonal changes that your body experiences during menopause. These can affect how your body processes dietary fats, triglycerides, and cholesterol. Heart attack and stroke are both medical emergencies. There are many things that you can do to help prevent heart disease and stroke:  Have your blood pressure checked at least every 1-2 years. High blood pressure causes heart disease and increases the risk of stroke.  If you are 73-69 years old, ask your health care provider if you should take aspirin to prevent a heart attack or a stroke.  Do not use any tobacco products, including cigarettes, chewing tobacco, or electronic cigarettes. If you need help quitting, ask your health care provider.  It is important to eat a healthy diet and maintain a healthy weight.  Be sure to include plenty of vegetables, fruits, low-fat dairy products, and lean protein.  Avoid eating foods that are high in solid fats, added sugars, or salt (sodium).  Get regular exercise. This is one of the most important things that you can do for your health.  Try to exercise for at least 150 minutes each week. The type of exercise that you do should increase your heart rate and make you sweat. This is known as moderate-intensity exercise.  Try to do strengthening exercises at least twice each week. Do these in addition to the moderate-intensity exercise.  Know your numbers.Ask your health care provider to check your cholesterol and  your blood glucose. Continue to have your blood tested as directed by your health care provider. WHAT SHOULD I KNOW ABOUT CANCER SCREENING? There are several types of cancer. Take the following steps to reduce your risk and to catch any cancer development as early as possible. Breast Cancer  Practice breast self-awareness.  This means understanding how your breasts  normally appear and feel.  It also means doing regular breast self-exams. Let your health care provider know about any changes, no matter how small.  If you are 83 or older, have a clinician do a breast exam (clinical breast exam or CBE) every year. Depending on your age, family history, and medical history, it may be recommended that you also have a yearly breast X-ray (mammogram).  If you have a family history of breast cancer, talk with your health care provider about genetic screening.  If you are at high risk for breast cancer, talk with your health care provider about having an MRI and a mammogram every year.  Breast cancer (BRCA) gene test is recommended for women who have family members with BRCA-related cancers. Results of the assessment will determine the need for genetic counseling and BRCA1 and for BRCA2 testing. BRCA-related cancers include these types:  Breast. This occurs in males or females.  Ovarian.  Tubal. This may also be called fallopian tube cancer.  Cancer of the abdominal or pelvic lining (peritoneal cancer).  Prostate.  Pancreatic. Cervical, Uterine, and Ovarian Cancer Your health care provider may recommend that you be screened regularly for cancer of the pelvic organs. These include your ovaries, uterus, and vagina. This screening involves a pelvic exam, which includes checking for microscopic changes to the surface of your cervix (Pap test).  For women ages 21-65, health care providers may recommend a pelvic exam and a Pap test every three years. For women ages 35-65, they may recommend the Pap test and pelvic exam, combined with testing for human papilloma virus (HPV), every five years. Some types of HPV increase your risk of cervical cancer. Testing for HPV may also be done on women of any age who have unclear Pap test results.  Other health care providers may not recommend any screening for nonpregnant women who are considered low risk for pelvic cancer and  have no symptoms. Ask your health care provider if a screening pelvic exam is right for you.  If you have had past treatment for cervical cancer or a condition that could lead to cancer, you need Pap tests and screening for cancer for at least 20 years after your treatment. If Pap tests have been discontinued for you, your risk factors (such as having a new sexual partner) need to be reassessed to determine if you should start having screenings again. Some women have medical problems that increase the chance of getting cervical cancer. In these cases, your health care provider may recommend that you have screening and Pap tests more often.  If you have a family history of uterine cancer or ovarian cancer, talk with your health care provider about genetic screening.  If you have vaginal bleeding after reaching menopause, tell your health care provider.  There are currently no reliable tests available to screen for ovarian cancer. Lung Cancer Lung cancer screening is recommended for adults 50-78 years old who are at high risk for lung cancer because of a history of smoking. A yearly low-dose CT scan of the lungs is recommended if you:  Currently smoke.  Have a history of at least  30 pack-years of smoking and you currently smoke or have quit within the past 15 years. A pack-year is smoking an average of one pack of cigarettes per day for one year. Yearly screening should:  Continue until it has been 15 years since you quit.  Stop if you develop a health problem that would prevent you from having lung cancer treatment. Colorectal Cancer  This type of cancer can be detected and can often be prevented.  Routine colorectal cancer screening usually begins at age 66 and continues through age 61.  If you have risk factors for colon cancer, your health care provider may recommend that you be screened at an earlier age.  If you have a family history of colorectal cancer, talk with your health care  provider about genetic screening.  Your health care provider may also recommend using home test kits to check for hidden blood in your stool.  A small camera at the end of a tube can be used to examine your colon directly (sigmoidoscopy or colonoscopy). This is done to check for the earliest forms of colorectal cancer.  Direct examination of the colon should be repeated every 5-10 years until age 19. However, if early forms of precancerous polyps or small growths are found or if you have a family history or genetic risk for colorectal cancer, you may need to be screened more often. Skin Cancer  Check your skin from head to toe regularly.  Monitor any moles. Be sure to tell your health care provider:  About any new moles or changes in moles, especially if there is a change in a mole's shape or color.  If you have a mole that is larger than the size of a pencil eraser.  If any of your family members has a history of skin cancer, especially at a young age, talk with your health care provider about genetic screening.  Always use sunscreen. Apply sunscreen liberally and repeatedly throughout the day.  Whenever you are outside, protect yourself by wearing long sleeves, pants, a wide-brimmed hat, and sunglasses. WHAT SHOULD I KNOW ABOUT OSTEOPOROSIS? Osteoporosis is a condition in which bone destruction happens more quickly than new bone creation. After menopause, you may be at an increased risk for osteoporosis. To help prevent osteoporosis or the bone fractures that can happen because of osteoporosis, the following is recommended:  If you are 1-37 years old, get at least 1,000 mg of calcium and at least 600 mg of vitamin D per day.  If you are older than age 55 but younger than age 16, get at least 1,200 mg of calcium and at least 600 mg of vitamin D per day.  If you are older than age 47, get at least 1,200 mg of calcium and at least 800 mg of vitamin D per day. Smoking and excessive  alcohol intake increase the risk of osteoporosis. Eat foods that are rich in calcium and vitamin D, and do weight-bearing exercises several times each week as directed by your health care provider. WHAT SHOULD I KNOW ABOUT HOW MENOPAUSE AFFECTS [place]? Depression may occur at any age, but it is more common as you become older. Common symptoms of depression include:  Low or sad mood.  Changes in sleep patterns.  Changes in appetite or eating patterns.  Feeling an overall lack of motivation or enjoyment of activities that you previously enjoyed.  Frequent crying spells. Talk with your health care provider if you think that you are experiencing depression. WHAT  SHOULD I KNOW ABOUT IMMUNIZATIONS? It is important that you get and maintain your immunizations. These include:  Tetanus, diphtheria, and pertussis (Tdap) booster vaccine.  Influenza every year before the flu season begins.  Pneumonia vaccine.  Shingles vaccine. Your health care provider may also recommend other immunizations.   This information is not intended to replace advice given to you by your health care provider. Make sure you discuss any questions you have with your health care provider.   Document Released: 01/04/2006 Document Revised: 12/03/2014 Document Reviewed: 07/15/2014 Elsevier Interactive Patient Education [employer].

## 2016-04-09 NOTE — Progress Notes (Signed)
Pre visit review using our clinic review tool, if applicable. No additional management support is needed unless otherwise documented below in the visit note. 

## 2016-04-09 NOTE — Progress Notes (Signed)
Patient ID: Tracie Morris, female    DOB: 09-22-1956  Age: 60 y.o. MRN: [id number]  The patient is here for annual physical  examination and management of other chronic and acute problems.   Mammogram ordered Nov 2016 but not done,  Last one 2015  The risk factors are reflected in the social history. Needs to have CT of adrenal at same time of CT chest. needs Monday appointments for everything.   Discussed cologuard   The roster of all physicians providing medical care to patient - is listed in the Snapshot section of the chart.   Home safety : The patient has smoke detectors in the home. They wear seatbelts.  There are no firearms at home. There is no violence in the home.   There is no risks for hepatitis, STDs or HIV. There is no   history of blood transfusion. They have no travel history to infectious disease endemic areas of the world.  The patient has seen their dentist in the last six month. They have seen their eye doctor in the last year.    They do not  have excessive sun exposure. Discussed the need for sun protection: hats, long sleeves and use of sunscreen if there is significant sun exposure.   Diet: the importance of a healthy diet is discussed. They do have a healthy diet.  The benefits of regular aerobic exercise were discussed. She walks 4 times per week ,  20 minutes.   Depression screen: there are no signs or vegative symptoms of depression- irritability, change in appetite, anhedonia, sadness/tearfullness.  The following portions of the patient's history were reviewed and updated as appropriate: allergies, current medications, past family history, past medical history,  past surgical history, past social history  and problem list.  Visual acuity was not assessed per patient preference since she has regular follow up with her ophthalmologist. Hearing and body mass index were assessed and reviewed.   During the course of the visit the patient was educated and  counseled about appropriate screening and preventive services including : fall prevention , diabetes screening, nutrition counseling, colorectal cancer screening, and recommended immunizations.    CC: The primary encounter diagnosis was Other problems related to lifestyle. Diagnoses of Hyperlipidemia, Long-term use of high-risk medication, Screening for cervical cancer, Encounter for preventive health examination, Adrenal mass, left ([place]), and GAD (generalized anxiety disorder) were also pertinent to this visit.  Seen in Feb for new onset tremor, histor of adrenal incidentaloma. Catecholamine test normal dexamethaasone suppression test was nornal.   Increased anxiety..  Did not tolerate remeron .  Wake sup with it  Has it even on weekends and vacations,  Using alprazolam, 1/2 tablet prn day and prn night     Endocrinee and cardiology evaluations this year no new issues.   Painful intercourse.requesting vaginal estrogen    Neck pain on the right starting to bother her more. Has tried motrin with food.  Requesting tramadol   Needs repeat chest CT this summer for follow up on 5 mm RUL nodule seen on CT August 2016   History Christienne has a past medical history of Hypocalcemia; Migraine; multinodular goiter (2007); Hyperlipidemia; hypothyroidism (2007); Carotid artery occlusion; and Peripheral arterial disease ([place]).   She has past surgical history that includes Thyroid surgery (2007); Angioplasty illiac artery; Cesarean section; Nose surgery; Skin cancer excision; and abdominal aortagram (N/A, 10/14/2013).   Her family history includes Alzheimer's disease in her father; Coronary artery disease in her maternal grandmother; Coronary  artery disease (age of onset: 15) in her paternal grandmother; Heart disease (age of onset: 60) in her maternal grandmother; Heart disease (age of onset: 14) in her paternal grandmother; Stroke in her mother.She reports that she quit smoking about 4 years ago. Her smoking use  included Cigarettes. She has a 20 pack-year smoking history. She has never used smokeless tobacco. She reports that she does not drink alcohol or use illicit drugs.  Outpatient Prescriptions Prior to Visit  Medication Sig Dispense Refill  . ALPRAZolam (XANAX) 0.5 MG tablet TAKE ONE TABLET AT BEDTIME AS NEEDED FORANXIETY 30 tablet 2  . aspirin 81 MG tablet Take 1 tablet (81 mg total) by mouth daily. 30 tablet   . clopidogrel (PLAVIX) 75 MG tablet Take 1 tablet (75 mg total) by mouth once. ONE EVERY 5 DAYS (Patient taking differently: Take 75 mg by mouth daily as needed. ONE EVERY 5 DAYS) 30 tablet 6  . escitalopram (LEXAPRO) 10 MG tablet Take 1 tablet (10 mg total) by mouth daily. 3 tablet 5  . levothyroxine (SYNTHROID, LEVOTHROID) 112 MCG tablet Take 1 tablet (112 mcg total) by mouth daily. 90 tablet 3  . rizatriptan (MAXALT-MLT) 10 MG disintegrating tablet TAKE 1 TABLET BY MOUTH AS NEEDED MAY REPEAT IN 2 HOURS IF NEEDED 10 tablet 0  . rosuvastatin (CRESTOR) 20 MG tablet Take 1 tablet (20 mg total) by mouth daily. 90 tablet 3  . zolpidem (AMBIEN) 10 MG tablet Take 1 tablet (10 mg total) by mouth at bedtime as needed for sleep. (Patient taking differently: Take 5 mg by mouth at bedtime as needed for sleep. ) 30 tablet 5   No facility-administered medications prior to visit.    Review of Systems   Patient denies headache, fevers, malaise, unintentional weight loss, skin rash, eye pain, sinus congestion and sinus pain, sore throat, dysphagia,  hemoptysis , cough, dyspnea, wheezing, chest pain, palpitations, orthopnea, edema, abdominal pain, nausea, melena, diarrhea, constipation, flank pain, dysuria, hematuria, urinary  Frequency, nocturia, numbness, tingling, seizures,  Focal weakness, Loss of consciousness,  Tremor, insomnia, depression, anxiety, and suicidal ideation.      Objective:  BP 122/70 mmHg  Pulse 81  Temp(Src) 98.2 F (36.8 C) (Oral)  Ht 5' 7.5" (1.715 m)  Wt 125 lb (56.7 kg)   BMI 19.28 kg/m2  SpO2 97%  Physical Exam  General Appearance:    Alert, cooperative, no distress, appears stated age  Head:    Normocephalic, without obvious abnormality, atraumatic  Eyes:    PERRL, conjunctiva/corneas clear, EOM's intact, fundi    benign, both eyes  Ears:    Normal TM's and external ear canals, both ears  Nose:   Nares normal, septum midline, mucosa normal, no drainage    or sinus tenderness  Throat:   Lips, mucosa, and tongue normal; teeth and gums normal  Neck:   Supple, symmetrical, trachea midline, no adenopathy;    thyroid:  no enlargement/tenderness/nodules; no carotid   bruit or JVD  Back:     Symmetric, no curvature, ROM normal, no CVA tenderness  Lungs:     Clear to auscultation bilaterally, respirations unlabored  Chest Wall:    No tenderness or deformity   Heart:    Regular rate and rhythm, S1 and S2 normal, no murmur, rub   or gallop  Breast Exam:    No tenderness, masses, or nipple abnormality  Abdomen:     Soft, non-tender, bowel sounds active all four quadrants,    no masses, no organomegaly  Genitalia:    Pelvic: cervix normal in appearance, external genitalia normal, no adnexal masses or tenderness, no cervical motion tenderness, rectovaginal septum normal, uterus normal size, shape, and consistency and vagina normal without discharge  Extremities:   Extremities normal, atraumatic, no cyanosis or edema  Pulses:   2+ and symmetric all extremities  Skin:   Skin color, texture, turgor normal, no rashes or lesions  Lymph nodes:   Cervical, supraclavicular, and axillary nodes normal  Neurologic:   CNII-XII intact, normal strength, sensation and reflexes    throughout     Assessment & Plan:   Problem List Items Addressed This Visit    Encounter for preventive health examination    Annual comprehensive preventive exam was done as well as an evaluation and management of chronic conditions .  During the course of the visit the patient was educated and  counseled about appropriate screening and preventive services including :  diabetes screening, lipid analysis with projected  10 year  risk for CAD  , , nutrition counseling, colorectal cancer screening, and recommended immunizations.  Printed recommendations for health maintenance screenings was given.        Adrenal mass, left ([place])    She is due for adrenal imaging,  Which will need coordination with non contrast lung Ct .  Will coordinate with Dr [name],       GAD (generalized anxiety disorder)    Not controlled on lexapro.   discussed chagne to paxil.       Hyperlipidemia   Relevant Orders   Lipid panel (Completed)   Screening for cervical cancer   Relevant Orders   Cytology - PAP (Completed)    Other Visit Diagnoses    Other problems related to lifestyle    -  Primary    Relevant Orders    Hepatitis C antibody (Completed)    HIV antibody (Completed)    Long-term use of high-risk medication        Relevant Orders    Comprehensive metabolic panel (Completed)       I am having Ms. Loren start on ibuprofen and PARoxetine. I am also having her maintain her aspirin, clopidogrel, rizatriptan, zolpidem, rosuvastatin, ALPRAZolam, escitalopram, and levothyroxine.  Meds ordered this encounter  Medications  . ibuprofen (ADVIL,MOTRIN) 600 MG tablet    Sig: Take 1 tablet (600 mg total) by mouth every 8 (eight) hours as needed.    Dispense:  90 tablet    Refill:  2  . PARoxetine (PAXIL) 10 MG tablet    Sig: Take 1 tablet (10 mg total) by mouth daily.    Dispense:  90 tablet    Refill:  1    There are no discontinued medications.  Follow-up: No Follow-up on file.   [name], MD

## 2016-04-10 LAB — CYTOLOGY - PAP

## 2016-04-10 LAB — HEPATITIS C ANTIBODY: HCV AB: NEGATIVE

## 2016-04-10 NOTE — Assessment & Plan Note (Signed)
Annual comprehensive preventive exam was done as well as an evaluation and management of chronic conditions .  During the course of the visit the patient was educated and counseled about appropriate screening and preventive services including :  diabetes screening, lipid analysis with projected  10 year  risk for CAD  , , nutrition counseling, colorectal cancer screening, and recommended immunizations.  Printed recommendations for health maintenance screenings was given.   

## 2016-04-10 NOTE — Assessment & Plan Note (Addendum)
She is due for adrenal imaging,  Which will need coordination with non contrast lung Ct .  Will coordinate with Dr [name],

## 2016-04-10 NOTE — Assessment & Plan Note (Signed)
Not controlled on lexapro.   discussed chagne to paxil.

## 2016-04-12 ENCOUNTER — Encounter: Payer: Self-pay | Admitting: Internal Medicine

## 2016-04-17 ENCOUNTER — Other Ambulatory Visit: Payer: Self-pay | Admitting: Internal Medicine

## 2016-04-17 DIAGNOSIS — E278 Other specified disorders of adrenal gland: Secondary | ICD-10-CM

## 2016-04-17 NOTE — Progress Notes (Signed)
Imaging requestin CT of abdomen be with or without contrast CT ordered.

## 2016-04-17 NOTE — Addendum Note (Signed)
Addended by: [name] on: 04/17/2016 08:11 AM   Modules accepted: Orders, SmartSet

## 2016-04-18 ENCOUNTER — Other Ambulatory Visit: Payer: Self-pay | Admitting: Internal Medicine

## 2016-04-19 ENCOUNTER — Telehealth: Payer: Self-pay | Admitting: *Deleted

## 2016-04-19 NOTE — Telephone Encounter (Signed)
Received referral for low dose lung cancer screening CT scan. Voicemail left at phone number listed in EMR for patient to call me back to facilitate scheduling scan.  

## 2016-04-19 NOTE — Telephone Encounter (Signed)
Received referral for initial lung cancer screening scan. Contacted patient and obtained smoking history as well as answering questions related to screening process. Patient reports 3/4 of a pack to 1 pack of cigarettes for 25 years and does not meet the minimum 30 pack year history for a screening scan. However, she does need follow up by low dose scan for a known lung nodule. This information will be forwarded to Dr. Derrel Nip.

## 2016-04-27 ENCOUNTER — Other Ambulatory Visit: Payer: Self-pay | Admitting: Internal Medicine

## 2016-04-27 DIAGNOSIS — Z1231 Encounter for screening mammogram for malignant neoplasm of breast: Secondary | ICD-10-CM

## 2016-04-29 ENCOUNTER — Other Ambulatory Visit: Payer: Self-pay | Admitting: Internal Medicine

## 2016-04-29 DIAGNOSIS — R911 Solitary pulmonary nodule: Secondary | ICD-10-CM

## 2016-05-03 ENCOUNTER — Encounter: Payer: Self-pay | Admitting: Internal Medicine

## 2016-05-14 ENCOUNTER — Other Ambulatory Visit: Payer: Self-pay | Admitting: Internal Medicine

## 2016-05-14 ENCOUNTER — Ambulatory Visit
Admission: RE | Admit: 2016-05-14 | Discharge: 2016-05-14 | Disposition: A | Discharge status: Home / Self Care | Payer: BC Managed Care – PPO | Source: Ambulatory Visit | Attending: Internal Medicine | Admitting: Internal Medicine

## 2016-05-14 DIAGNOSIS — Z1231 Encounter for screening mammogram for malignant neoplasm of breast: Secondary | ICD-10-CM

## 2016-05-18 ENCOUNTER — Telehealth: Payer: Self-pay | Admitting: *Deleted

## 2016-05-21 ENCOUNTER — Ambulatory Visit: Admission: RE | Admit: 2016-05-21 | Payer: BC Managed Care – PPO | Source: Ambulatory Visit

## 2016-05-21 ENCOUNTER — Ambulatory Visit
Admission: RE | Admit: 2016-05-21 | Discharge: 2016-05-21 | Disposition: A | Discharge status: Home / Self Care | Payer: BC Managed Care – PPO | Source: Ambulatory Visit | Attending: Internal Medicine | Admitting: Internal Medicine

## 2016-05-21 DIAGNOSIS — J432 Centrilobular emphysema: Secondary | ICD-10-CM | POA: Insufficient documentation

## 2016-05-21 DIAGNOSIS — R911 Solitary pulmonary nodule: Secondary | ICD-10-CM | POA: Diagnosis not present

## 2016-05-22 ENCOUNTER — Encounter: Payer: Self-pay | Admitting: Internal Medicine

## 2016-06-06 NOTE — Telephone Encounter (Signed)
Mailed unread message to patient.  

## 2016-08-12 IMAGING — CT CT CHEST W/O CM
1 series · 15 of 34 positions shown, 19 images · non-contrast
Comparison: 08/02/2015

CLINICAL DATA: Followup lung nodule. History of thyroid cancer.
Smoker.

EXAM:
CT CHEST WITHOUT CONTRAST
TECHNIQUE: Multidetector CT imaging of the chest was performed following the
standard protocol without IV contrast.

[Series 2: thorax · axial · 0.62mm/px · z∈[-844,-582]mm · 15 of 155 slices shown, 19 images]
[im 12/155  mediastinal]
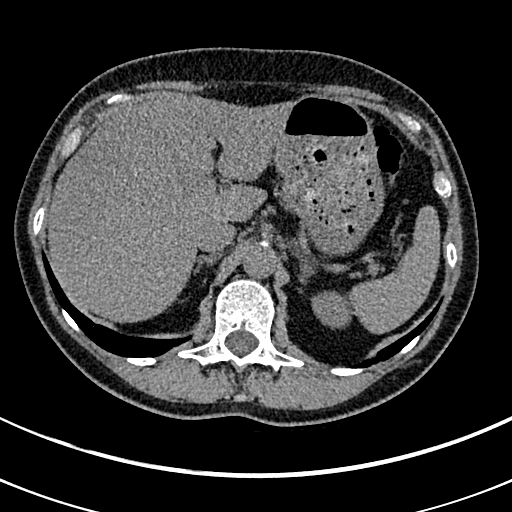
[im 12/155  lung]
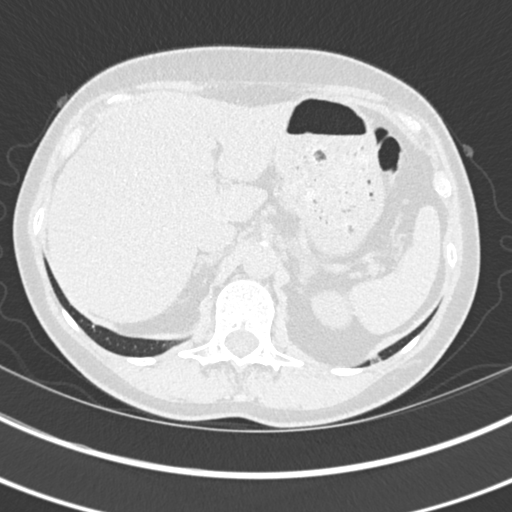
[im 23/155  lung]
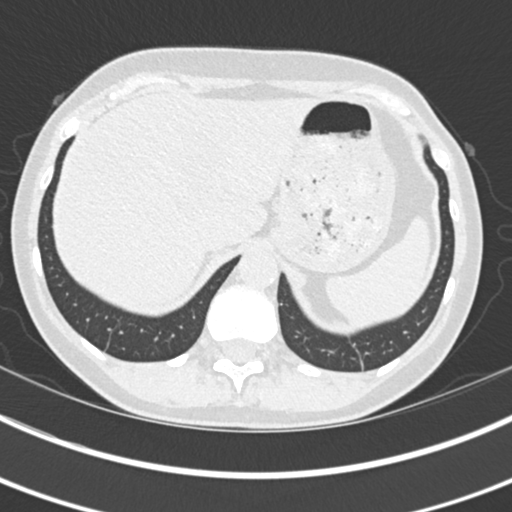
[im 31/155  lung]
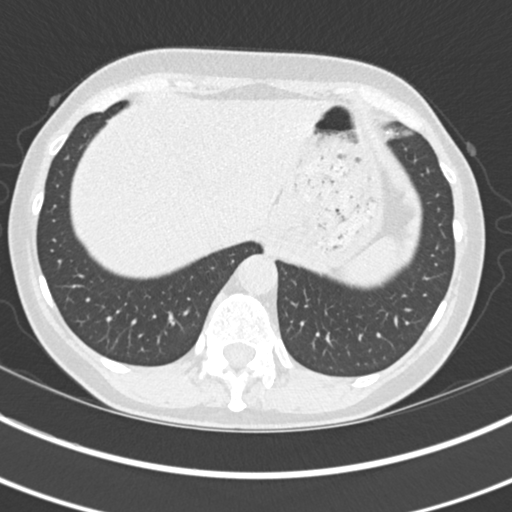
[im 40/155  lung]
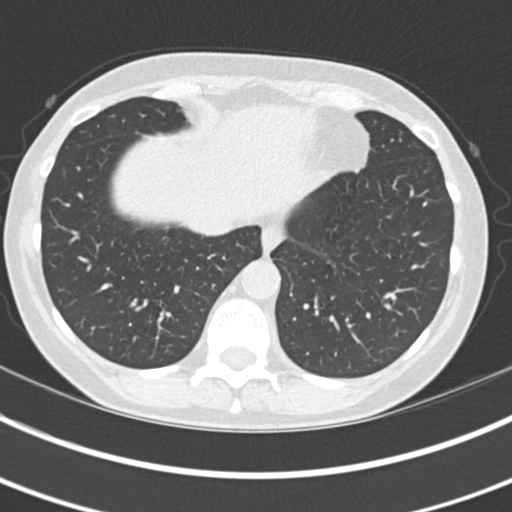
[im 52/155  mediastinal]
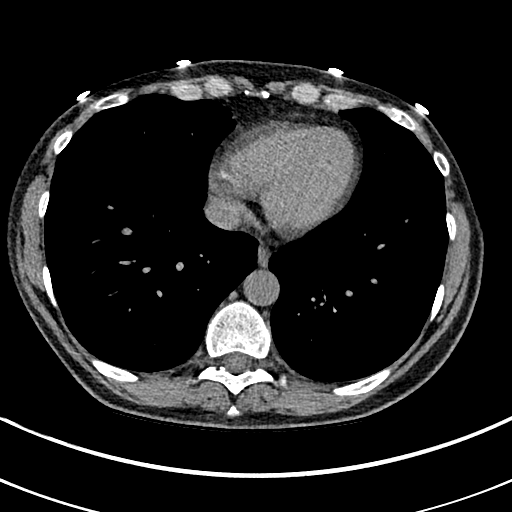
[im 52/155  lung]
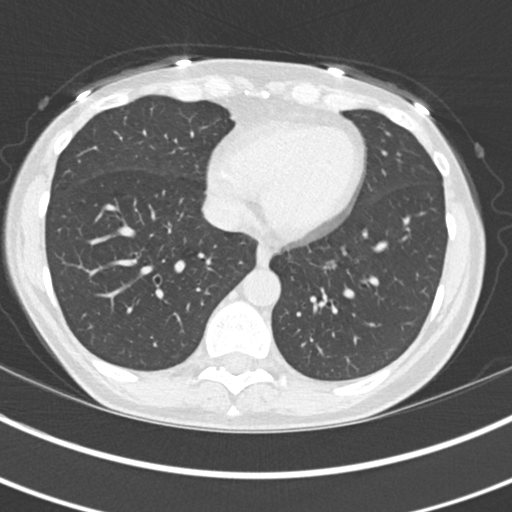
[im 62/155  lung]
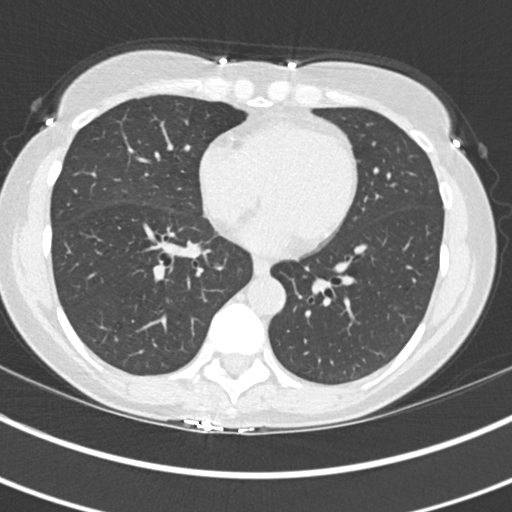
[im 69/155  lung]
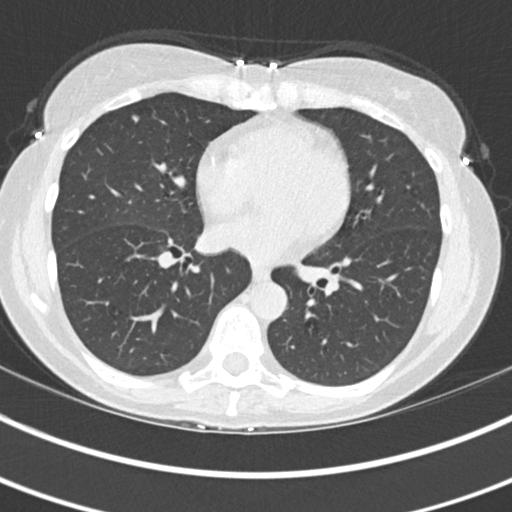
[im 80/155  lung]
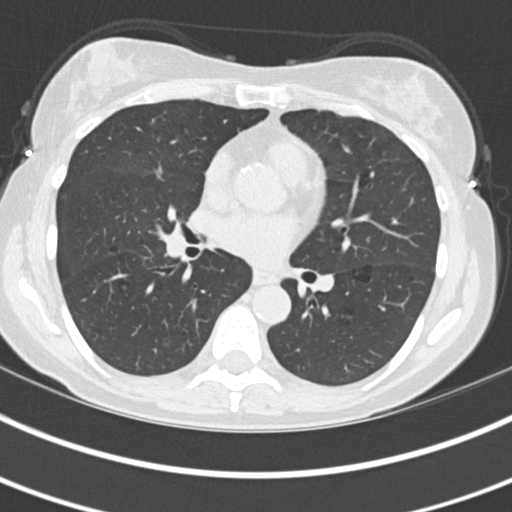
[im 86/155  mediastinal]
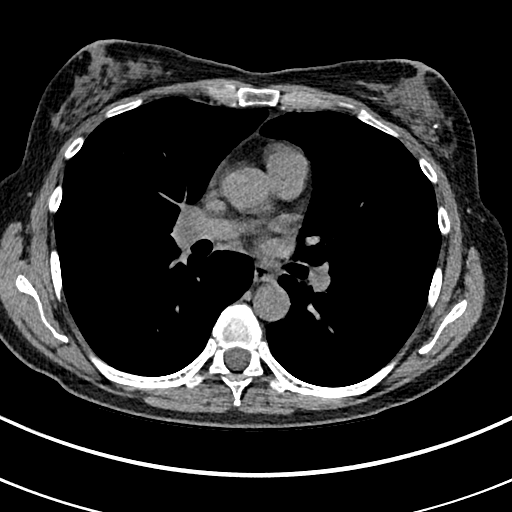
[im 86/155  lung]
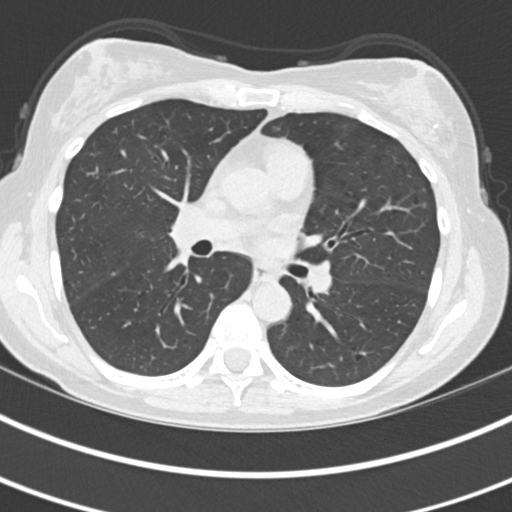
[im 93/155  lung]
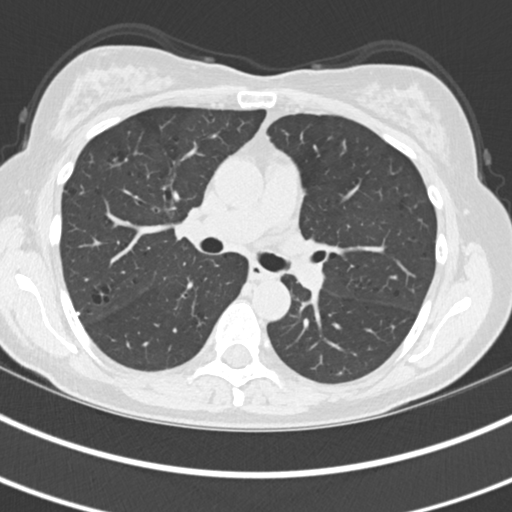
[im 103/155  lung]
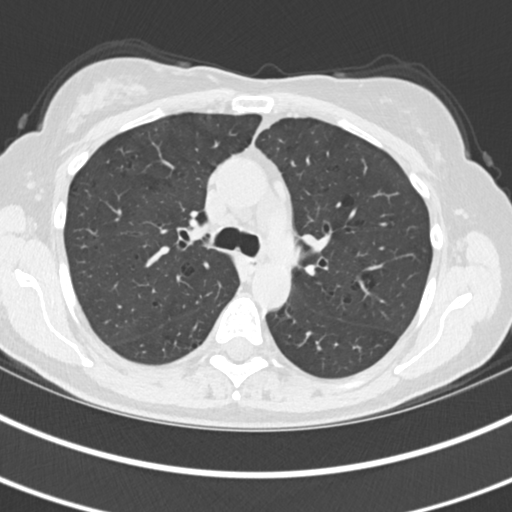
[im 115/155  lung]
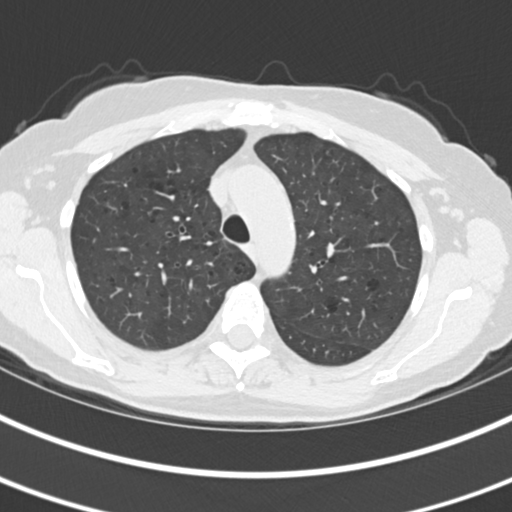
[im 124/155  mediastinal]
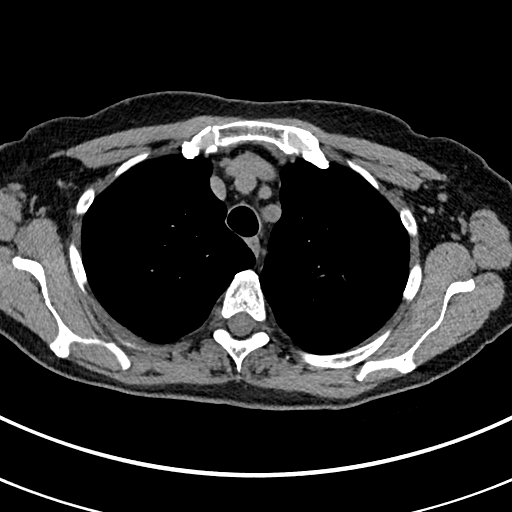
[im 124/155  lung]
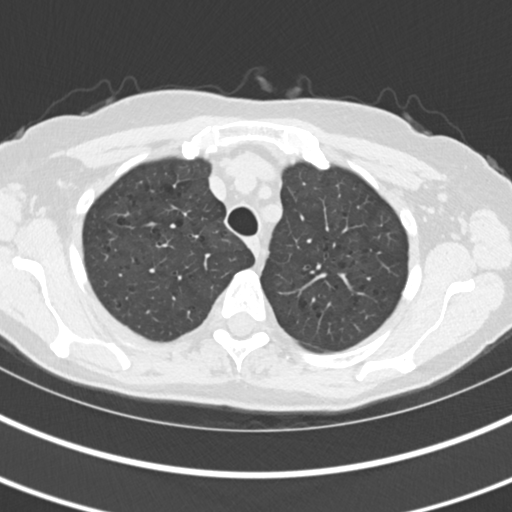
[im 132/155  lung]
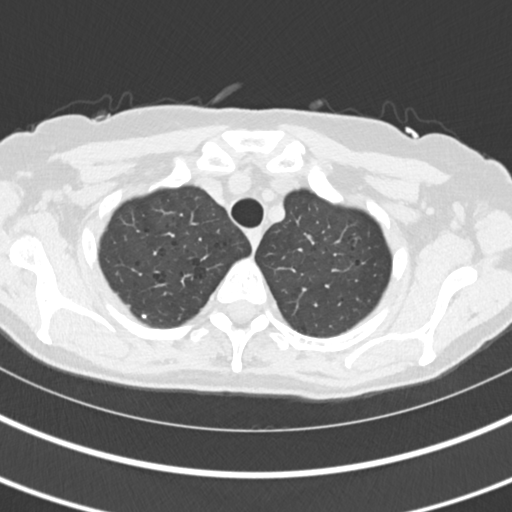
[im 143/155  lung]
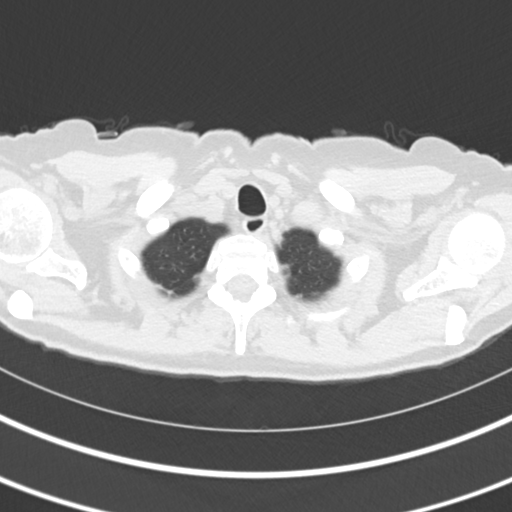

[15 of 34 positions shown; findings below may reference images not displayed]

FINDINGS: Cardiovascular: Calcifications in all 3 major coronary vessels.
Calcifications in the aortic arch and descending thoracic aorta. No
aneurysm. Heart is normal size.

Mediastinum: Small scattered mediastinal and axillary lymph nodes,
none pathologically enlarged.

Lungs/Pleura: Mild centrilobular emphysema. Small ovoid nodule in
the right middle lobe measures 5 mm, stable since prior study. No
new or enlarging pulmonary nodules. Small scattered calcified
granulomas in the upper lobes.

Upper Abdomen: Heterogeneous density to the liver. No acute findings
in the upper abdomen.

Musculoskeletal: Chest wall soft tissues are unremarkable. No acute
bony abnormality.
IMPRESSION: Stable 5 mm perifissural right middle lobe nodule. This could be
followed with repeat CT in 12-18 months.

Mild centrilobular emphysema.  Old granulomatous disease.

Geographic areas of low density noted within the liver, likely
geographic fatty infiltration. This could be confirmed with
ultrasound or contrast-enhanced CT.

## 2016-08-28 DIAGNOSIS — M79676 Pain in unspecified toe(s): Secondary | ICD-10-CM | POA: Insufficient documentation

## 2016-10-30 ENCOUNTER — Other Ambulatory Visit: Payer: Self-pay

## 2016-10-30 ENCOUNTER — Telehealth: Payer: Self-pay | Admitting: Cardiovascular Disease

## 2016-10-30 DIAGNOSIS — I739 Peripheral vascular disease, unspecified: Secondary | ICD-10-CM

## 2016-10-30 NOTE — Telephone Encounter (Signed)
S/w pt of Dr. Tyrell Antonio recommendations. She understands our office does not have an opening until January therefore, I will contact Outpatient Imaging tomorrow and call back with a date/time.

## 2016-10-30 NOTE — Telephone Encounter (Signed)
Pt reports left hip, foot and leg pain for 2 weeks. She has feeling in her left foot but it is cold and the pulse is weak.  She has known PAD s/p left common iliac stent placement Nov 2014 for claudication.  Last ABI and aorta iliac duplex September 2015.  Offered pt appt but she would like to have repeat ABI and duplex first if MD agreeable. Forward to Dr. Fletcher Anon.

## 2016-10-30 NOTE — Telephone Encounter (Signed)
Schedule an ABI and aortoiliac duplex.

## 2016-10-30 NOTE — Telephone Encounter (Signed)
Pt states for 2 weeks, she has had pain in her left foot, hip, and leg. States he foot feels cold, and states this is the way it felt before when she had a blockage. Please call. States when she walks it feels as if her muscles are tightening up.

## 2016-10-31 NOTE — Telephone Encounter (Addendum)
Pt scheduled for ABI/aorta iliac Dec 7, 11:30am.  Left message on pt cell. Attempted to reach patient at work but she is not at her desk. Did not leave message at work.

## 2016-10-31 NOTE — Telephone Encounter (Signed)
S/w pt who confirmed 12/7 11:30 ABI/aorta iliac appt. She understands to have a light breakfast.

## 2016-11-01 ENCOUNTER — Ambulatory Visit: Payer: BC Managed Care – PPO

## 2016-11-01 DIAGNOSIS — I739 Peripheral vascular disease, unspecified: Secondary | ICD-10-CM | POA: Diagnosis not present

## 2016-11-01 DIAGNOSIS — I700 Atherosclerosis of aorta: Secondary | ICD-10-CM | POA: Diagnosis not present

## 2016-11-01 DIAGNOSIS — R202 Paresthesia of skin: Secondary | ICD-10-CM | POA: Diagnosis not present

## 2016-11-02 ENCOUNTER — Ambulatory Visit: Payer: BC Managed Care – PPO | Admitting: Cardiovascular Disease

## 2016-11-05 ENCOUNTER — Ambulatory Visit (INDEPENDENT_AMBULATORY_CARE_PROVIDER_SITE_OTHER): Payer: BC Managed Care – PPO | Admitting: Cardiovascular Disease

## 2016-11-05 ENCOUNTER — Encounter: Payer: Self-pay | Admitting: Cardiovascular Disease

## 2016-11-05 VITALS — BP 140/82 | HR 81 | Ht 67.5 in | Wt 125.0 lb

## 2016-11-05 DIAGNOSIS — Z01812 Encounter for preprocedural laboratory examination: Secondary | ICD-10-CM | POA: Diagnosis not present

## 2016-11-05 DIAGNOSIS — I739 Peripheral vascular disease, unspecified: Secondary | ICD-10-CM | POA: Diagnosis not present

## 2016-11-05 DIAGNOSIS — E7849 Other hyperlipidemia: Secondary | ICD-10-CM

## 2016-11-05 DIAGNOSIS — E784 Other hyperlipidemia: Secondary | ICD-10-CM

## 2016-11-05 MED ORDER — CLOPIDOGREL BISULFATE 75 MG PO TABS: 75.0000 mg | ORAL_TABLET | Freq: Every day | ORAL | 5 refills | Status: DC

## 2016-11-05 NOTE — Progress Notes (Signed)
Cardiology Office Note   Date:  11/05/2016   ID:  Tracie Morris, DOB [birth year], MRN [id number]  PCP:  [name], MD  Cardiologist:   [name], MD   Chief Complaint  Patient presents with  . othe r    Follow up from test. Pt. c/o left leg cramps when walking and her toes turning blue on left foot, she has chest pain. Meds reviewed by the pt. verbally.       History of Present Illness: Tracie Morris is a 60 y.o. female who presents for a followup visit regarding peripheral arterial disease.  She is status post left common iliac artery stent placement in November of 2014 for significant claudication. Other Medical history include previous tobacco use and hyperlipidemia  She has been doing wellUp until 2-3 weeks ago when she noted sudden onset of left leg cramping and discomfort with minimal walking. This was unusual for her given that she has been physically very active and has been exercising regularly. This is now happening with minimal activities and she has not been able to resume her exercise. She also noticed purplish discoloration in her left toes especially the big toe. She has numbness. Recent noninvasive vascular evaluation showed a drop in her ABI 0.69 on the left with evidence of severe stenosis affecting the left common iliac artery. Previous vascular studies in 2016 were overall unremarkable.  Past Medical History:  Diagnosis Date  . Carotid artery occlusion   . Hyperlipidemia   . Hypocalcemia   . hypothyroidism 2007   acquired, secondary to surgery for thyroid Ca  . Migraine    improved since thyroid surgery, 2 years  . multinodular goiter 2007   s/p thyroidectomy  . 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 Repeat angiography in November of 2014 showed 50% distal aortic stenosis extending into the left common iliac artery which had 60-70%  ostial stenosis. No other significant disease. I placed on balloon expandable stent into  left common iliac artery extending slightly into the distal aorta    Past Surgical History:  Procedure Laterality Date  . ABDOMINAL AORTAGRAM N/A 10/14/2013   Procedure: ABDOMINAL [name];  Surgeon: Wellington Hampshire, MD;  Location: [facility];  Service: Cardiovascular;  Laterality: N/A;  . ANGIOPLASTY ILLIAC ARTERY    . CESAREAN SECTION    . NOSE SURGERY    . SKIN CANCER EXCISION    . THYROID SURGERY  2007   secondary to thyroid cancer     Current Outpatient Prescriptions  Medication Sig Dispense Refill  . ALPRAZolam (XANAX) 0.5 MG tablet TAKE ONE TABLET BY MOUTH AT BEDTIME AS NEEDED FOR ANXIETY 30 tablet 5  . aspirin 81 MG tablet Take 1 tablet (81 mg total) by mouth daily. 30 tablet   . clopidogrel (PLAVIX) 75 MG tablet Take 1 tablet (75 mg total) by mouth once. ONE EVERY 5 DAYS (Patient taking differently: Take 75 mg by mouth daily as needed. ONE EVERY 5 DAYS) 30 tablet 6  . levothyroxine (SYNTHROID, LEVOTHROID) 112 MCG tablet Take 1 tablet (112 mcg total) by mouth daily. 90 tablet 3  . PARoxetine (PAXIL) 10 MG tablet Take 1 tablet (10 mg total) by mouth daily. 90 tablet 1  . rizatriptan (MAXALT-MLT) 10 MG disintegrating tablet TAKE 1 TABLET BY MOUTH AS NEEDED MAY REPEAT IN 2 HOURS IF NEEDED 10 tablet 0  . rosuvastatin (  CRESTOR) 20 MG tablet Take 1 tablet (20 mg total) by mouth daily. 90 tablet 3   No current facility-administered medications for this visit.     Allergies:   Atorvastatin; Darvocet [propoxyphene n-acetaminophen]; Demerol; Methocarbamol; Mirtazapine; and Ambien [zolpidem tartrate]    Social History:  The patient  reports that she quit smoking about 5 years ago. Her smoking use included Cigarettes. She has a 20.00 pack-year smoking history. She has never used smokeless tobacco. She reports that she does not drink alcohol or use drugs.   Family History:  The patient's family  history includes Alzheimer's disease in her father; Coronary artery disease in her maternal grandmother; Coronary artery disease (age of onset: 20) in her paternal grandmother; Heart disease (age of onset: 35) in her maternal grandmother; Heart disease (age of onset: 81) in her paternal grandmother; Stroke in her mother.    ROS:  Please see the history of present illness.   Otherwise, review of systems are positive for none.   All other systems are reviewed and negative.    PHYSICAL EXAM: VS:  BP 140/82 (BP Location: Left Arm, Patient Position: Sitting, Cuff Size: Normal)   Pulse 81   Ht 5' 7.5" (1.715 m)   Wt 125 lb (56.7 kg)   BMI 19.29 kg/m  , BMI Body mass index is 19.29 kg/m. GEN: Well nourished, well developed, in no acute distress HEENT: normal Neck: no JVD, carotid bruits, or masses Cardiac: RRR; no murmurs, rubs, or gallops,no edema  Respiratory:  clear to auscultation bilaterally, normal work of breathing GI: soft, nontender, nondistended, + BS MS: no deformity or atrophy Skin: warm and dry, no rash Neuro:  Strength and sensation are intact Psych: euthymic mood, full affect Vascular: Femoral pulse: +2 on the right and barely palpable on the left. Distal pulses are only palpable on the right. Left toes are bluish especially the big toe.  EKG:  EKG is ordered today. The ekg ordered today demonstrates normal sinus rhythm with no significant ST or T wave changes.   Recent Labs: 02/20/2016: TSH 1.81 04/09/2016: ALT 16; BUN 14; Creatinine, Ser 0.76; Potassium 4.3; Sodium 140    Lipid Panel    Component Value Date/Time   CHOL 170 04/09/2016 1126   TRIG 68.0 04/09/2016 1126   HDL 60.80 04/09/2016 1126   CHOLHDL 3 04/09/2016 1126   VLDL 13.6 04/09/2016 1126   LDLCALC 96 04/09/2016 1126      Wt Readings from Last 3 Encounters:  11/05/16 125 lb (56.7 kg)  04/09/16 125 lb (56.7 kg)  02/20/16 122 lb (55.3 kg)        ASSESSMENT AND PLAN:  1.  Peripheral arterial  disease: Status post left common iliac artery stent placement.  Sudden onset of left leg claudication of 2-3 weeks duration which is currently severe and associated with significant ischemic changes affecting her toes. ABI dropped significantly from normal and duplex showed severe stenosis affecting the left common iliac artery. Given the severity of her symptoms, I recommend proceeding with abdominal aortogram, lower extremity runoff and possible endovascular intervention. Plan access via the left common femoral artery with ultrasound guidance. The acuity of her symptoms is worrisome for possible thrombosis. I asked her to start taking Plavix daily in addition to aspirin 81 mg once daily.  2. Hyperlipidemia: Continue treatment with rosuvastatin. Most recent lipid profile showed an LDL of 96.    Disposition:   FU with me in 1 month  Signed,  [name], MD  11/05/2016  3:35 PM    [facility]

## 2016-11-05 NOTE — Patient Instructions (Addendum)
Medication Instructions:  Your physician has recommended you make the following change in your medication:  START taking Plavix 75mg  once daily   Labwork: BMET, CBC, PT/INR  Testing/Procedures: Abdominal aortogram at [facility], Short Stay [place] [phone]  Wednesday, December 20 Arrival time: 8:30am Nothing to eat or drink after midnight the evening before your procedure.    Follow-Up: Your physician recommends that you schedule a follow-up appointment in: one month with Dr. Fletcher Anon.    Any Other Special Instructions Will Be Listed Below (If Applicable).     If you need a refill on your cardiac medications before your next appointment, please call your pharmacy.   Angiogram An angiogram, also called angiography, is a procedure used to look at the blood vessels. In this procedure, dye is injected through a long, thin tube (catheter) into an artery. X-rays are then taken. The X-rays will show if there is a blockage or problem in a blood vessel. Tell a health care provider about:  Any allergies you have, including allergies to shellfish or contrast dye.  All medicines you are taking, including vitamins, herbs, eye drops, creams, and over-the-counter medicines.  Any problems you or family members have had with anesthetic medicines.  Any blood disorders you have.  Any surgeries you have had.  Any previous kidney problems or failure you have had.  Any medical conditions you have.  Possibility of pregnancy, if this applies. What are the risks? Generally, an angiogram is a safe procedure. However, as with any procedure, problems can occur. Possible problems include:  Injury to the blood vessels, including rupture or bleeding.  Infection or bruising at the catheter site.  Allergic reaction to the dye or contrast used.  Kidney damage from the dye or contrast used.  Blood clots that can lead to a stroke or heart attack. What  happens before the procedure?  Do not eat or drink after midnight on the night before the procedure, or as directed by your health care provider.  Ask your health care provider if you may drink enough water to take any needed medicines the morning of the procedure. What happens during the procedure?  You may be given a medicine to help you relax (sedative) before and during the procedure. This medicine is given through an IV access tube that is inserted into one of your veins.  The area where the catheter will be inserted will be washed and shaved. This is usually done in the groin but may be done in the fold of your arm (near your elbow) or in the wrist.  A medicine will be given to numb the area where the catheter will be inserted (local anesthetic).  The catheter will be inserted with a guide wire into an artery. The catheter is guided by using a type of X-ray (fluoroscopy) to the blood vessel being examined.  Dye is then injected into the catheter, and X-rays are taken. The dye helps to show where any narrowing or blockages are located. What happens after the procedure?  If the procedure is done through the leg, you will be kept in bed lying flat for several hours. You will be instructed to not bend or cross your legs.  The insertion site will be checked frequently.  The pulse in your feet or wrist will be checked frequently.  Additional blood tests, X-rays, and electrocardiography may be done.  You may need to stay in the hospital overnight for observation. This information is not  intended to replace advice given to you by your health care provider. Make sure you discuss any questions you have with your health care provider. Document Released: 08/22/2005 Document Revised: 04/25/2016 Document Reviewed: 04/15/2013 Elsevier Interactive Patient Education  2017 [employer] After Refer to this sheet in the next few weeks. These instructions provide you with  information about caring for yourself after your procedure. Your health care provider may also give you more specific instructions. Your treatment has been planned according to current medical practices, but problems sometimes occur. Call your health care provider if you have any problems or questions after your procedure. What can I expect after the procedure? After your procedure, it is typical to have the following:  Bruising at the catheter insertion site that usually fades within 1-2 weeks.  Blood collecting in the tissue (hematoma) that may be painful to the touch. It should usually decrease in size and tenderness within 1-2 weeks. Follow these instructions at home:  Take medicines only as directed by your health care provider.  You may shower 24-48 hours after the procedure or as directed by your health care provider. Remove the bandage (dressing) and gently wash the site with plain soap and water. Pat the area dry with a clean towel. Do not rub the site, because this may cause bleeding.  Do not take baths, swim, or use a hot tub until your health care provider approves.  Check your insertion site every day for redness, swelling, or drainage.  Do not apply powder or lotion to the site.  Do not lift over 10 lb (4.5 kg) for 5 days after your procedure or as directed by your health care provider.  Ask your health care provider when it is okay to:  Return to work or school.  Resume usual physical activities or sports.  Resume sexual activity.  Do not drive home if you are discharged the same day as the procedure. Have someone else drive you.  You may drive 24 hours after the procedure unless otherwise instructed by your health care provider.  Do not operate machinery or power tools for 24 hours after the procedure or as directed by your health care provider.  If your procedure was done as an outpatient procedure, which means that you went home the same day as your procedure, a  responsible adult should be with you for the first 24 hours after you arrive home.  Keep all follow-up visits as directed by your health care provider. This is important. Contact a health care provider if:  You have a fever.  You have chills.  You have increased bleeding from the catheter insertion site. Hold pressure on the site. Get help right away if:  You have unusual pain at the catheter insertion site.  You have redness, warmth, or swelling at the catheter insertion site.  You have drainage (other than a small amount of blood on the dressing) from the catheter insertion site.  The catheter insertion site is bleeding, and the bleeding does not stop after 30 minutes of holding steady pressure on the site.  The area near or just beyond the catheter insertion site becomes pale, cool, tingly, or numb. This information is not intended to replace advice given to you by your health care provider. Make sure you discuss any questions you have with your health care provider. Document Released: 05/31/2005 Document Revised: 04/19/2016 Document Reviewed: 04/15/2013 Elsevier Interactive Patient Education  2017 [employer].

## 2016-11-06 LAB — PROTIME-INR
INR: 1 (ref 0.8–1.2)
PROTHROMBIN TIME: 10.9 s (ref 9.1–12.0)

## 2016-11-06 LAB — BASIC METABOLIC PANEL
BUN / CREAT RATIO: 19 (ref 12–28)
BUN: 13 mg/dL (ref 8–27)
CALCIUM: 9.6 mg/dL (ref 8.7–10.3)
CO2: 21 mmol/L (ref 18–29)
Chloride: 102 mmol/L (ref 96–106)
Creatinine, Ser: 0.69 mg/dL (ref 0.57–1.00)
GFR calc non Af Amer: 94 mL/min/{1.73_m2} (ref >59)
GFR, EST AFRICAN AMERICAN: 109 mL/min/{1.73_m2} (ref >59)
Glucose: 66 mg/dL (ref 65–99)
Potassium: 4.1 mmol/L (ref 3.5–5.2)
Sodium: 143 mmol/L (ref 134–144)

## 2016-11-06 LAB — CBC
HEMOGLOBIN: 16.6 g/dL — AB (ref 11.1–15.9)
Hematocrit: 48.1 % — ABNORMAL HIGH (ref 34.0–46.6)
MCH: 34.9 pg — ABNORMAL HIGH (ref 26.6–33.0)
MCHC: 34.5 g/dL (ref 31.5–35.7)
MCV: 101 fL — ABNORMAL HIGH (ref 79–97)
Platelets: 251 10*3/uL (ref 150–379)
RBC: 4.75 x10E6/uL (ref 3.77–5.28)
RDW: 12.9 % (ref 12.3–15.4)
WBC: 15 10*3/uL — ABNORMAL HIGH (ref 3.4–10.8)

## 2016-11-13 ENCOUNTER — Telehealth: Payer: Self-pay | Admitting: Cardiovascular Disease

## 2016-11-13 NOTE — Telephone Encounter (Signed)
Left detailed message regarding abdominal aortogram on pt home VM. Provided CB number if questions.

## 2016-11-14 ENCOUNTER — Ambulatory Visit (HOSPITAL_COMMUNITY)
Admission: RE | Admit: 2016-11-14 | Discharge: 2016-11-14 | Disposition: A | Discharge status: Home / Self Care | Payer: BC Managed Care – PPO | Source: Ambulatory Visit | Attending: Cardiovascular Disease | Admitting: Cardiovascular Disease

## 2016-11-14 ENCOUNTER — Encounter (HOSPITAL_COMMUNITY)
Admission: RE | Disposition: A | Discharge status: Home / Self Care | Payer: Self-pay | Source: Ambulatory Visit | Attending: Cardiovascular Disease

## 2016-11-14 DIAGNOSIS — E890 Postprocedural hypothyroidism: Secondary | ICD-10-CM | POA: Insufficient documentation

## 2016-11-14 DIAGNOSIS — G43909 Migraine, unspecified, not intractable, without status migrainosus: Secondary | ICD-10-CM | POA: Insufficient documentation

## 2016-11-14 DIAGNOSIS — Z7902 Long term (current) use of antithrombotics/antiplatelets: Secondary | ICD-10-CM | POA: Insufficient documentation

## 2016-11-14 DIAGNOSIS — Z85850 Personal history of malignant neoplasm of thyroid: Secondary | ICD-10-CM | POA: Diagnosis not present

## 2016-11-14 DIAGNOSIS — I6529 Occlusion and stenosis of unspecified carotid artery: Secondary | ICD-10-CM | POA: Insufficient documentation

## 2016-11-14 DIAGNOSIS — E785 Hyperlipidemia, unspecified: Secondary | ICD-10-CM | POA: Insufficient documentation

## 2016-11-14 DIAGNOSIS — I739 Peripheral vascular disease, unspecified: Secondary | ICD-10-CM | POA: Diagnosis present

## 2016-11-14 DIAGNOSIS — Z7982 Long term (current) use of aspirin: Secondary | ICD-10-CM | POA: Insufficient documentation

## 2016-11-14 DIAGNOSIS — T82868A Thrombosis of vascular prosthetic devices, implants and grafts, initial encounter: Secondary | ICD-10-CM | POA: Diagnosis not present

## 2016-11-14 DIAGNOSIS — Z823 Family history of stroke: Secondary | ICD-10-CM | POA: Diagnosis not present

## 2016-11-14 DIAGNOSIS — Z87891 Personal history of nicotine dependence: Secondary | ICD-10-CM | POA: Insufficient documentation

## 2016-11-14 DIAGNOSIS — Z8249 Family history of ischemic heart disease and other diseases of the circulatory system: Secondary | ICD-10-CM | POA: Insufficient documentation

## 2016-11-14 DIAGNOSIS — Y812 Prosthetic and other implants, materials and accessory general- and plastic-surgery devices associated with adverse incidents: Secondary | ICD-10-CM | POA: Insufficient documentation

## 2016-11-14 DIAGNOSIS — I70212 Atherosclerosis of native arteries of extremities with intermittent claudication, left leg: Secondary | ICD-10-CM

## 2016-11-14 HISTORY — PX: PERIPHERAL VASCULAR CATHETERIZATION: SHX172C

## 2016-11-14 LAB — POCT ACTIVATED CLOTTING TIME
ACTIVATED CLOTTING TIME: 246 s
ACTIVATED CLOTTING TIME: 180 s

## 2016-11-14 SURGERY — ABDOMINAL AORTOGRAM

## 2016-11-14 MED ORDER — IODIXANOL 320 MG/ML IV SOLN
INTRAVENOUS | Status: DC | PRN
  Administered 2016-11-14: 100 mL via INTRAVENOUS

## 2016-11-14 MED ORDER — SODIUM CHLORIDE 0.9% FLUSH: 3.0000 mL | Freq: Two times a day (BID) | INTRAVENOUS | Status: DC

## 2016-11-14 MED ORDER — LIDOCAINE HCL (PF) 1 % IJ SOLN
INTRAMUSCULAR | Status: AC
  Filled 2016-11-14: qty 30

## 2016-11-14 MED ORDER — HEPARIN (PORCINE) IN NACL 2-0.9 UNIT/ML-% IJ SOLN
INTRAMUSCULAR | Status: DC | PRN
  Administered 2016-11-14: 1000 mL

## 2016-11-14 MED ORDER — MIDAZOLAM HCL 2 MG/2ML IJ SOLN
INTRAMUSCULAR | Status: AC
  Filled 2016-11-14: qty 2

## 2016-11-14 MED ORDER — ASPIRIN 81 MG PO CHEW
CHEWABLE_TABLET | ORAL | Status: AC
  Administered 2016-11-14: 81 mg via ORAL
  Filled 2016-11-14: qty 1

## 2016-11-14 MED ORDER — SODIUM CHLORIDE 0.9 % IV SOLN: 250.0000 mL | INTRAVENOUS | Status: DC | PRN

## 2016-11-14 MED ORDER — HEPARIN SODIUM (PORCINE) 1000 UNIT/ML IJ SOLN
INTRAMUSCULAR | Status: DC | PRN
  Administered 2016-11-14: 4000 [IU] via INTRAVENOUS

## 2016-11-14 MED ORDER — CLOPIDOGREL BISULFATE 300 MG PO TABS
ORAL_TABLET | ORAL | Status: AC
  Filled 2016-11-14: qty 1

## 2016-11-14 MED ORDER — SODIUM CHLORIDE 0.9% FLUSH: 3.0000 mL | INTRAVENOUS | Status: DC | PRN

## 2016-11-14 MED ORDER — SODIUM CHLORIDE 0.9 % IV SOLN: INTRAVENOUS | Status: AC

## 2016-11-14 MED ORDER — SODIUM CHLORIDE 0.9 % WEIGHT BASED INFUSION
3.0000 mL/kg/h | INTRAVENOUS | Status: AC
  Administered 2016-11-14: 3 mL/kg/h via INTRAVENOUS

## 2016-11-14 MED ORDER — MIDAZOLAM HCL 2 MG/2ML IJ SOLN
INTRAMUSCULAR | Status: DC | PRN
  Administered 2016-11-14 (×2): 1 mg via INTRAVENOUS

## 2016-11-14 MED ORDER — FENTANYL CITRATE (PF) 100 MCG/2ML IJ SOLN
INTRAMUSCULAR | Status: DC | PRN
  Administered 2016-11-14 (×3): 25 ug via INTRAVENOUS

## 2016-11-14 MED ORDER — CLOPIDOGREL BISULFATE 300 MG PO TABS
ORAL_TABLET | ORAL | Status: DC | PRN
  Administered 2016-11-14: 300 mg via ORAL

## 2016-11-14 MED ORDER — ASPIRIN 81 MG PO CHEW
81.0000 mg | CHEWABLE_TABLET | ORAL | Status: AC
  Administered 2016-11-14: 81 mg via ORAL

## 2016-11-14 MED ORDER — FENTANYL CITRATE (PF) 100 MCG/2ML IJ SOLN
INTRAMUSCULAR | Status: AC
  Filled 2016-11-14: qty 2

## 2016-11-14 MED ORDER — LIDOCAINE HCL (PF) 1 % IJ SOLN
INTRAMUSCULAR | Status: DC | PRN
  Administered 2016-11-14: 12 mL
  Administered 2016-11-14: 10 mL

## 2016-11-14 MED ORDER — HEPARIN SODIUM (PORCINE) 1000 UNIT/ML IJ SOLN
INTRAMUSCULAR | Status: AC
  Filled 2016-11-14: qty 1

## 2016-11-14 MED ORDER — SODIUM CHLORIDE 0.9 % WEIGHT BASED INFUSION: 1.0000 mL/kg/h | INTRAVENOUS | Status: DC

## 2016-11-14 MED ORDER — HEPARIN (PORCINE) IN NACL 2-0.9 UNIT/ML-% IJ SOLN
INTRAMUSCULAR | Status: AC
  Filled 2016-11-14: qty 1000

## 2016-11-14 SURGICAL SUPPLY — 18 items
CATH ANGIO 5F PIGTAIL 65CM (CATHETERS) ×1 IMPLANT
DEVICE CLOSURE MYNXGRIP 6/7F (Vascular Products) ×1 IMPLANT
DEVICE CONTINUOUS FLUSH (MISCELLANEOUS) ×1 IMPLANT
KIT ENCORE 26 ADVANTAGE (KITS) ×1 IMPLANT
KIT MICROINTRODUCER STIFF 5F (SHEATH) ×1 IMPLANT
KIT PV (KITS) ×4 IMPLANT
SHEATH BRITE TIP 7FR 35CM (SHEATH) ×1 IMPLANT
SHEATH PINNACLE 5F 10CM (SHEATH) ×1 IMPLANT
SHEATH PINNACLE 7F 10CM (SHEATH) ×1 IMPLANT
STENT LIFESTREAM 8X37X80 (Permanent Stent) ×1 IMPLANT
STOPCOCK MORSE 400PSI 3WAY (MISCELLANEOUS) ×1 IMPLANT
SYRINGE MEDRAD AVANTA MACH 7 (SYRINGE) ×1 IMPLANT
TAPE RADIOPAQUE TURBO (MISCELLANEOUS) ×1 IMPLANT
TRANSDUCER W/STOPCOCK (MISCELLANEOUS) ×4 IMPLANT
TRAY PV CATH (CUSTOM PROCEDURE TRAY) ×4 IMPLANT
TUBING CIL FLEX 10 FLL-RA (TUBING) ×1 IMPLANT
WIRE HITORQ VERSACORE ST 145CM (WIRE) ×1 IMPLANT
WIRE VERSACORE LOC 115CM (WIRE) ×1 IMPLANT

## 2016-11-14 NOTE — Discharge Instructions (Signed)
Angiogram, Care After °These instructions give you information about caring for yourself after your procedure. Your doctor may also give you more specific instructions. Call your doctor if you have any problems or questions after your procedure. °Follow these instructions at home: °· Take medicines only as told by your doctor. °· Follow your doctor's instructions about: °¨ Care of the area where the tube was inserted. °¨ Bandage (dressing) changes and removal. °· You may shower 24-48 hours after the procedure or as told by your doctor. °· Do not take baths, swim, or use a hot tub until your doctor approves. °· Every day, check the area where the tube was inserted. Watch for: °¨ Redness, swelling, or pain. °¨ Fluid, blood, or pus. °· Do not apply powder or lotion to the site. °· Do not lift anything that is heavier than 10 lb (4.5 kg) for 5 days or as told by your doctor. °· Ask your doctor when you can: °¨ Return to work or school. °¨ Do physical activities or play sports. °¨ Have sex. °· Do not drive or operate heavy machinery for 24 hours or as told by your doctor. °· Have someone with you for the first 24 hours after the procedure. °· Keep all follow-up visits as told by your doctor. This is important. °Contact a health care provider if: °· You have a fever. °· You have chills. °· You have more bleeding from the area where the tube was inserted. Hold pressure on the area. °· You have redness, swelling, or pain in the area where the tube was inserted. °· You have fluid or pus coming from the area. °Get help right away if: °· You have a lot of pain in the area where the tube was inserted. °· The area where the tube was inserted is bleeding, and the bleeding does not stop after 30 minutes of holding steady pressure on the area. °· The area near or just beyond the insertion site becomes pale, cool, tingly, or numb. °This information is not intended to replace advice given to you by your health care provider. Make  sure you discuss any questions you have with your health care provider. °Document Released: 02/08/2009 Document Revised: 04/19/2016 Document Reviewed: 04/15/2013 °Elsevier Interactive Patient Education © 2017 [employer] ° °

## 2016-11-14 NOTE — H&P (View-Only) (Signed)
Cardiology Office Note   Date:  11/05/2016   ID:  Tracie Morris, DOB 12-01-1955, MRN [id number]  PCP:  [name], MD  Cardiologist:   [name], MD   Chief Complaint  Patient presents with  . othe r    Follow up from test. Pt. c/o left leg cramps when walking and her toes turning blue on left foot, she has chest pain. Meds reviewed by the pt. verbally.       History of Present Illness: Tracie Morris is a 60 y.o. female who presents for a followup visit regarding peripheral arterial disease.  She is status post left common iliac artery stent placement in November of 2014 for significant claudication. Other Medical history include previous tobacco use and hyperlipidemia  She has been doing wellUp until 2-3 weeks ago when she noted sudden onset of left leg cramping and discomfort with minimal walking. This was unusual for her given that she has been physically very active and has been exercising regularly. This is now happening with minimal activities and she has not been able to resume her exercise. She also noticed purplish discoloration in her left toes especially the big toe. She has numbness. Recent noninvasive vascular evaluation showed a drop in her ABI 0.69 on the left with evidence of severe stenosis affecting the left common iliac artery. Previous vascular studies in 2016 were overall unremarkable.  Past Medical History:  Diagnosis Date  . Carotid artery occlusion   . Hyperlipidemia   . Hypocalcemia   . hypothyroidism 2007   acquired, secondary to surgery for thyroid Ca  . Migraine    improved since thyroid surgery, 2 years  . multinodular goiter 2007   s/p thyroidectomy  . 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 Repeat angiography in November of 2014 showed 50% distal aortic stenosis extending into the left common iliac artery which had 60-70%  ostial stenosis. No other significant disease. I placed on balloon expandable stent into  left common iliac artery extending slightly into the distal aorta    Past Surgical History:  Procedure Laterality Date  . ABDOMINAL AORTAGRAM N/A 10/14/2013   Procedure: ABDOMINAL [name];  Surgeon: Wellington Hampshire, MD;  Location: [facility];  Service: Cardiovascular;  Laterality: N/A;  . ANGIOPLASTY ILLIAC ARTERY    . CESAREAN SECTION    . NOSE SURGERY    . SKIN CANCER EXCISION    . THYROID SURGERY  2007   secondary to thyroid cancer     Current Outpatient Prescriptions  Medication Sig Dispense Refill  . ALPRAZolam (XANAX) 0.5 MG tablet TAKE ONE TABLET BY MOUTH AT BEDTIME AS NEEDED FOR ANXIETY 30 tablet 5  . aspirin 81 MG tablet Take 1 tablet (81 mg total) by mouth daily. 30 tablet   . clopidogrel (PLAVIX) 75 MG tablet Take 1 tablet (75 mg total) by mouth once. ONE EVERY 5 DAYS (Patient taking differently: Take 75 mg by mouth daily as needed. ONE EVERY 5 DAYS) 30 tablet 6  . levothyroxine (SYNTHROID, LEVOTHROID) 112 MCG tablet Take 1 tablet (112 mcg total) by mouth daily. 90 tablet 3  . PARoxetine (PAXIL) 10 MG tablet Take 1 tablet (10 mg total) by mouth daily. 90 tablet 1  . rizatriptan (MAXALT-MLT) 10 MG disintegrating tablet TAKE 1 TABLET BY MOUTH AS NEEDED MAY REPEAT IN 2 HOURS IF NEEDED 10 tablet 0  . rosuvastatin (  CRESTOR) 20 MG tablet Take 1 tablet (20 mg total) by mouth daily. 90 tablet 3   No current facility-administered medications for this visit.     Allergies:   Atorvastatin; Darvocet [propoxyphene n-acetaminophen]; Demerol; Methocarbamol; Mirtazapine; and Ambien [zolpidem tartrate]    Social History:  The patient  reports that she quit smoking about 5 years ago. Her smoking use included Cigarettes. She has a 20.00 pack-year smoking history. She has never used smokeless tobacco. She reports that she does not drink alcohol or use drugs.   Family History:  The patient's family  history includes Alzheimer's disease in her father; Coronary artery disease in her maternal grandmother; Coronary artery disease (age of onset: 62) in her paternal grandmother; Heart disease (age of onset: 14) in her maternal grandmother; Heart disease (age of onset: 73) in her paternal grandmother; Stroke in her mother.    ROS:  Please see the history of present illness.   Otherwise, review of systems are positive for none.   All other systems are reviewed and negative.    PHYSICAL EXAM: VS:  BP 140/82 (BP Location: Left Arm, Patient Position: Sitting, Cuff Size: Normal)   Pulse 81   Ht 5' 7.5" (1.715 m)   Wt 125 lb (56.7 kg)   BMI 19.29 kg/m  , BMI Body mass index is 19.29 kg/m. GEN: Well nourished, well developed, in no acute distress HEENT: normal Neck: no JVD, carotid bruits, or masses Cardiac: RRR; no murmurs, rubs, or gallops,no edema  Respiratory:  clear to auscultation bilaterally, normal work of breathing GI: soft, nontender, nondistended, + BS MS: no deformity or atrophy Skin: warm and dry, no rash Neuro:  Strength and sensation are intact Psych: euthymic mood, full affect Vascular: Femoral pulse: +2 on the right and barely palpable on the left. Distal pulses are only palpable on the right. Left toes are bluish especially the big toe.  EKG:  EKG is ordered today. The ekg ordered today demonstrates normal sinus rhythm with no significant ST or T wave changes.   Recent Labs: 02/20/2016: TSH 1.81 04/09/2016: ALT 16; BUN 14; Creatinine, Ser 0.76; Potassium 4.3; Sodium 140    Lipid Panel    Component Value Date/Time   CHOL 170 04/09/2016 1126   TRIG 68.0 04/09/2016 1126   HDL 60.80 04/09/2016 1126   CHOLHDL 3 04/09/2016 1126   VLDL 13.6 04/09/2016 1126   LDLCALC 96 04/09/2016 1126      Wt Readings from Last 3 Encounters:  11/05/16 125 lb (56.7 kg)  04/09/16 125 lb (56.7 kg)  02/20/16 122 lb (55.3 kg)        ASSESSMENT AND PLAN:  1.  Peripheral arterial  disease: Status post left common iliac artery stent placement.  Sudden onset of left leg claudication of 2-3 weeks duration which is currently severe and associated with significant ischemic changes affecting her toes. ABI dropped significantly from normal and duplex showed severe stenosis affecting the left common iliac artery. Given the severity of her symptoms, I recommend proceeding with abdominal aortogram, lower extremity runoff and possible endovascular intervention. Plan access via the left common femoral artery with ultrasound guidance. The acuity of her symptoms is worrisome for possible thrombosis. I asked her to start taking Plavix daily in addition to aspirin 81 mg once daily.  2. Hyperlipidemia: Continue treatment with rosuvastatin. Most recent lipid profile showed an LDL of 96.    Disposition:   FU with me in 1 month  Signed,  [name], MD  11/05/2016  3:35 PM    [facility]

## 2016-11-14 NOTE — Interval H&P Note (Signed)
History and Physical Interval Note:  11/14/2016 10:14 AM  Tracie Morris  has presented today for surgery, with the diagnosis of pvd  The various methods of treatment have been discussed with the patient and family. After consideration of risks, benefits and other options for treatment, the patient has consented to  Procedure(s): Abdominal Aortogram w/Lower Extremity (N/A) as a surgical intervention .  The patient's history has been reviewed, patient examined, no change in status, stable for surgery.  I have reviewed the patient's chart and labs.  Questions were answered to the patient's satisfaction.     [name]

## 2016-11-14 NOTE — Progress Notes (Addendum)
Site area: RFA Site Prior to Removal:  Level 0 Pressure Applied For:25 min Manual:   yes Patient Status During Pull: stable  Post Pull Site:  Level Post Pull Instructions Given: yes  Post Pull Pulses Present: palpable Dressing Applied:  tegaderm Bedrest begins @ 1325 till 1725 Comments:

## 2016-11-15 ENCOUNTER — Other Ambulatory Visit: Payer: Self-pay

## 2016-11-15 ENCOUNTER — Encounter (HOSPITAL_COMMUNITY): Payer: Self-pay | Admitting: Cardiovascular Disease

## 2016-11-15 ENCOUNTER — Telehealth: Payer: Self-pay | Admitting: Cardiovascular Disease

## 2016-11-15 ENCOUNTER — Telehealth: Payer: Self-pay | Admitting: Internal Medicine

## 2016-11-15 DIAGNOSIS — I739 Peripheral vascular disease, unspecified: Secondary | ICD-10-CM

## 2016-11-15 NOTE — Telephone Encounter (Signed)
Pt called in and said that Dr. Cruzita Lederer was requesting her to have a CT Scan.  She didn't know if the orders ever got sent in for her to have that done and didn't know if she was supposed to call and schedule that or what she needed to do.

## 2016-11-15 NOTE — Telephone Encounter (Signed)
ABI and aortoiliac duplex scheduled 12/11/16 @ 8:30am Pt notified and agreeable.

## 2016-11-15 NOTE — Telephone Encounter (Signed)
Please advise.//AB/CMA 

## 2016-11-15 NOTE — Telephone Encounter (Signed)
Called and spoke with the pt and informed her of the message below.   Pt verbalized understand and agreed.//AB/CMA

## 2016-11-15 NOTE — Telephone Encounter (Signed)
At last visit, we discussed about having the new adrenal CT scan at the time of her pulmonary CT scan, but from what I can see in the chart, this has already been done during the summer. No worries, we can repeat the CT scan after our next visit in March. I will discuss with her when she comes back.

## 2016-11-20 ENCOUNTER — Other Ambulatory Visit: Payer: Self-pay | Admitting: Internal Medicine

## 2016-11-21 NOTE — Telephone Encounter (Signed)
Last OV 04/11/06 requesting refill on Alprazolam please advise ok to fill. Patient has follow up schedule 2/18

## 2016-12-07 ENCOUNTER — Other Ambulatory Visit: Payer: Self-pay | Admitting: Cardiovascular Disease

## 2016-12-07 DIAGNOSIS — I779 Disorder of arteries and arterioles, unspecified: Secondary | ICD-10-CM

## 2016-12-07 DIAGNOSIS — I739 Peripheral vascular disease, unspecified: Secondary | ICD-10-CM

## 2016-12-10 ENCOUNTER — Ambulatory Visit: Payer: BC Managed Care – PPO | Admitting: Cardiovascular Disease

## 2016-12-11 ENCOUNTER — Ambulatory Visit: Payer: BC Managed Care – PPO

## 2016-12-11 DIAGNOSIS — I779 Disorder of arteries and arterioles, unspecified: Secondary | ICD-10-CM

## 2016-12-11 DIAGNOSIS — I739 Peripheral vascular disease, unspecified: Secondary | ICD-10-CM

## 2016-12-17 ENCOUNTER — Telehealth: Payer: Self-pay | Admitting: Cardiovascular Disease

## 2016-12-17 NOTE — Telephone Encounter (Signed)
Patient had called and left voicemail message on Tracie Morris's line stating that Tracie Morris was calling back from message that was left on her voicemail by Tracie Morris regarding results. Called patient and left voicemail message to call back.

## 2016-12-17 NOTE — Telephone Encounter (Signed)
Spoke with patient and she states that she did not realize that Ivin Booty had left a detailed voicemail message on her cell phone. She states that she was able to listen to that and had no further questions at this time. She was appreciative for my call and confirmed her upcoming appointment with Dr. Fletcher Anon.

## 2016-12-24 ENCOUNTER — Encounter: Payer: Self-pay | Admitting: Cardiovascular Disease

## 2016-12-24 ENCOUNTER — Ambulatory Visit (INDEPENDENT_AMBULATORY_CARE_PROVIDER_SITE_OTHER): Payer: BC Managed Care – PPO | Admitting: Cardiovascular Disease

## 2016-12-24 VITALS — BP 120/60 | HR 96 | Ht 67.5 in | Wt 124.3 lb

## 2016-12-24 DIAGNOSIS — I739 Peripheral vascular disease, unspecified: Secondary | ICD-10-CM | POA: Diagnosis not present

## 2016-12-24 DIAGNOSIS — E785 Hyperlipidemia, unspecified: Secondary | ICD-10-CM | POA: Diagnosis not present

## 2016-12-24 DIAGNOSIS — I779 Disorder of arteries and arterioles, unspecified: Secondary | ICD-10-CM | POA: Diagnosis not present

## 2016-12-24 NOTE — Patient Instructions (Signed)
Medication Instructions:  Your physician recommends that you continue on your current medications as directed. Please refer to the Current Medication list given to you today.   Labwork: none  Testing/Procedures: Your physician has requested that you have a carotid duplex. This test is an ultrasound of the carotid arteries in your neck. It looks at blood flow through these arteries that supply the brain with blood. Allow one hour for this exam. There are no restrictions or special instructions.    Follow-Up: Your physician recommends that you schedule a follow-up appointment in: one month with Dr. Fletcher Anon.    Any Other Special Instructions Will Be Listed Below (If Applicable).     If you need a refill on your cardiac medications before your next appointment, please call your pharmacy.

## 2016-12-24 NOTE — Progress Notes (Signed)
Cardiology Office Note   Date:  12/25/2016   ID:  Tracie Morris, DOB [birth year], MRN [id number]  PCP:  [name], MD  Cardiologist:   [name], MD   Chief Complaint  Patient presents with  . other    1 month follow up from the covered stent placement to the left common iliac artery.. Meds reviewed by the pt. verbally. "doing well."       History of Present Illness: Tracie Morris is a 61 y.o. female who presents for a followup visit regarding peripheral arterial disease.  She is status post left common iliac artery stent placement in November of 2014 for significant claudication. Other Medical history include previous tobacco use and hyperlipidemia  She was seen recently for sudden onset of left leg cramping and discomfort with minimal walking. Noninvasive vascular evaluation showed a drop in her ABI 0.69 on the left with evidence of severe stenosis affecting the left common iliac artery. Previous vascular studies in 2016 were overall unremarkable. I proceeded with angiography which showed subtotal occlusion at the distal edge of the previously placed stent in the left common iliac artery with what seems to be in situ thrombosis. I proceeded with successful covered stent placement without residual stenosis. There was mild to moderate disease affecting the distal aorta and right common iliac artery.   She has been doing well with resolution of left calf claudication except for continued numbness in her left foot with bluish discoloration of the toes especially the large toe. No chest pain or shortness of breath. She had Life screening in 2012 and was found to have mild to moderate carotid disease with no recent follow-up.  Past Medical History:  Diagnosis Date  . Carotid artery occlusion   . Hyperlipidemia   . Hypocalcemia   . hypothyroidism 2007   acquired, secondary to surgery for thyroid Ca  . Migraine    improved since thyroid surgery, 2 years  . multinodular  goiter 2007   s/p thyroidectomy  . 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 Repeat angiography in November of 2014 showed 50% distal aortic stenosis extending into the left common iliac artery which had 60-70% ostial stenosis. No other significant disease. I placed on balloon expandable stent into  left common iliac artery extending slightly into the distal aorta    Past Surgical History:  Procedure Laterality Date  . ABDOMINAL AORTAGRAM N/A 10/14/2013   Procedure: ABDOMINAL [name];  Surgeon: Wellington Hampshire, MD;  Location: [facility];  Service: Cardiovascular;  Laterality: N/A;  . ANGIOPLASTY ILLIAC ARTERY    . CESAREAN SECTION    . NOSE SURGERY    . PERIPHERAL VASCULAR CATHETERIZATION N/A 11/14/2016   Procedure: Abdominal Aortogram;  Surgeon: Wellington Hampshire, MD;  Location: [facility];  Service: Cardiovascular;  Laterality: N/A;  . PERIPHERAL VASCULAR CATHETERIZATION Bilateral 11/14/2016   Procedure: Lower Extremity Angiography;  Surgeon: Wellington Hampshire, MD;  Location: [facility];  Service: Cardiovascular;  Laterality: Bilateral;  limited iliacs only  . PERIPHERAL VASCULAR CATHETERIZATION Left 11/14/2016   Procedure: Peripheral Vascular Intervention;  Surgeon: Wellington Hampshire, MD;  Location: [facility];  Service: Cardiovascular;  Laterality: Left;  common iliac  . SKIN CANCER EXCISION    . THYROID SURGERY  2007   secondary to thyroid cancer     Current Outpatient Prescriptions  Medication  Sig Dispense Refill  . ALPRAZolam (XANAX) 0.5 MG tablet TAKE ONE TABLET BY MOUTH AT BEDTIME AS NEEDED FOR ANXIETY 30 tablet 1  . aspirin 81 MG tablet Take 1 tablet (81 mg total) by mouth daily. 30 tablet   . Cholecalciferol (VITAMIN D-3) 1000 units CAPS Take 1,000 Units by mouth daily.    . clopidogrel (PLAVIX) 75 MG tablet Take 1 tablet (75 mg total) by mouth  daily. 30 tablet 5  . levothyroxine (SYNTHROID, LEVOTHROID) 112 MCG tablet Take 1 tablet (112 mcg total) by mouth daily. 90 tablet 3  . Multiple Vitamin (MULTIVITAMIN WITH MINERALS) TABS tablet Take 1 tablet by mouth daily.    Marland Kitchen PARoxetine (PAXIL) 10 MG tablet Take 1 tablet (10 mg total) by mouth daily. 90 tablet 1  . rizatriptan (MAXALT-MLT) 10 MG disintegrating tablet TAKE 1 TABLET BY MOUTH AS NEEDED MAY REPEAT IN 2 HOURS IF NEEDED (Patient taking differently: TAKE 1 TABLET BY MOUTH AS NEEDED MAY REPEAT IN 2 HOURS IF NEEDED FOR MIGRAINES) 10 tablet 0  . rosuvastatin (CRESTOR) 20 MG tablet Take 1 tablet (20 mg total) by mouth daily. 90 tablet 3   No current facility-administered medications for this visit.     Allergies:   Atorvastatin; Darvocet [propoxyphene n-acetaminophen]; Demerol; Methocarbamol; Mirtazapine; and Ambien [zolpidem tartrate]    Social History:  The patient  reports that she quit smoking about 5 years ago. Her smoking use included Cigarettes. She has a 20.00 pack-year smoking history. She has never used smokeless tobacco. She reports that she does not drink alcohol or use drugs.   Family History:  The patient's family history includes Alzheimer's disease in her father; Coronary artery disease in her maternal grandmother; Coronary artery disease (age of onset: 1) in her paternal grandmother; Heart disease (age of onset: 9) in her maternal grandmother; Heart disease (age of onset: 24) in her paternal grandmother; Stroke in her mother.    ROS:  Please see the history of present illness.   Otherwise, review of systems are positive for none.   All other systems are reviewed and negative.    PHYSICAL EXAM: VS:  BP 120/60 (BP Location: Left Arm, Patient Position: Sitting, Cuff Size: Normal)   Pulse 96   Ht 5' 7.5" (1.715 m)   Wt 124 lb 5 oz (56.4 kg)   BMI 19.18 kg/m  , BMI Body mass index is 19.18 kg/m. GEN: Well nourished, well developed, in no acute distress  HEENT:  normal  Neck: no JVD, carotid bruits, or masses Cardiac: RRR; no murmurs, rubs, or gallops,no edema  Respiratory:  clear to auscultation bilaterally, normal work of breathing GI: soft, nontender, nondistended, + BS MS: no deformity or atrophy  Skin: warm and dry, no rash Neuro:  Strength and sensation are intact Psych: euthymic mood, full affect Vascular: Femoral pulse: Normal bilaterally with no hematoma. Posterior tibial is normal bilaterally. Dorsalis pedis is normal on the right side and not palpable on the left side. it is palpable above the ankle. Left toes are bluish especially the big toe.  EKG:  EKG is ordered today. The ekg ordered today demonstrates normal sinus rhythm with no significant ST or T wave changes.   Recent Labs: 02/20/2016: TSH 1.81 04/09/2016: ALT 16 11/05/2016: BUN 13; Creatinine, Ser 0.69; Platelets 251; Potassium 4.1; Sodium 143    Lipid Panel    Component Value Date/Time   CHOL 170 04/09/2016 1126   TRIG 68.0 04/09/2016 1126   HDL 60.80 04/09/2016 1126  CHOLHDL 3 04/09/2016 1126   VLDL 13.6 04/09/2016 1126   LDLCALC 96 04/09/2016 1126      Wt Readings from Last 3 Encounters:  12/24/16 124 lb 5 oz (56.4 kg)  11/14/16 124 lb (56.2 kg)  11/05/16 125 lb (56.7 kg)        ASSESSMENT AND PLAN:  1.  Peripheral arterial disease: Status post recent left common iliac artery covered stent placement due to in situ thrombosis. I recommend lifelong dual antiplatelet therapy. Postprocedure ABI was normal bilaterally with normal velocities in the left common iliac artery. Toe  pressure was diminished on the left side and it appears that there was embolization into the dorsalis pedis. I am hoping that this will improve gradually with dual antiplatelet therapy and collateral formation. Angiography of this can be considered if there is no improvement.  2. Hyperlipidemia: Continue treatment with rosuvastatin. Most recent lipid profile showed an LDL of 96. We  should consider a target LDL of less than 70.  3. Carotid disease: Mild to moderate in 2012. I requested carotid Doppler for follow-up.    Disposition:   FU with me in 1 month  Signed,  [name], MD  12/25/2016 6:02 PM    [facility]

## 2016-12-31 ENCOUNTER — Encounter: Payer: Self-pay | Admitting: Internal Medicine

## 2016-12-31 ENCOUNTER — Ambulatory Visit (INDEPENDENT_AMBULATORY_CARE_PROVIDER_SITE_OTHER): Payer: BC Managed Care – PPO | Admitting: Internal Medicine

## 2016-12-31 VITALS — BP 132/80 | HR 88 | Resp 16 | Wt 124.0 lb

## 2016-12-31 DIAGNOSIS — I2510 Atherosclerotic heart disease of native coronary artery without angina pectoris: Secondary | ICD-10-CM

## 2016-12-31 DIAGNOSIS — E032 Hypothyroidism due to medicaments and other exogenous substances: Secondary | ICD-10-CM | POA: Diagnosis not present

## 2016-12-31 DIAGNOSIS — E785 Hyperlipidemia, unspecified: Secondary | ICD-10-CM | POA: Diagnosis not present

## 2016-12-31 DIAGNOSIS — F411 Generalized anxiety disorder: Secondary | ICD-10-CM

## 2016-12-31 DIAGNOSIS — I739 Peripheral vascular disease, unspecified: Secondary | ICD-10-CM

## 2016-12-31 DIAGNOSIS — D582 Other hemoglobinopathies: Secondary | ICD-10-CM | POA: Diagnosis not present

## 2016-12-31 DIAGNOSIS — R0600 Dyspnea, unspecified: Secondary | ICD-10-CM | POA: Diagnosis not present

## 2016-12-31 DIAGNOSIS — E559 Vitamin D deficiency, unspecified: Secondary | ICD-10-CM

## 2016-12-31 DIAGNOSIS — I2583 Coronary atherosclerosis due to lipid rich plaque: Secondary | ICD-10-CM

## 2016-12-31 DIAGNOSIS — R5383 Other fatigue: Secondary | ICD-10-CM

## 2016-12-31 DIAGNOSIS — E279 Disorder of adrenal gland, unspecified: Secondary | ICD-10-CM

## 2016-12-31 DIAGNOSIS — G4700 Insomnia, unspecified: Secondary | ICD-10-CM

## 2016-12-31 DIAGNOSIS — Z931 Gastrostomy status: Secondary | ICD-10-CM

## 2016-12-31 DIAGNOSIS — R748 Abnormal levels of other serum enzymes: Secondary | ICD-10-CM

## 2016-12-31 DIAGNOSIS — E278 Other specified disorders of adrenal gland: Secondary | ICD-10-CM

## 2016-12-31 DIAGNOSIS — R911 Solitary pulmonary nodule: Secondary | ICD-10-CM

## 2016-12-31 DIAGNOSIS — D7589 Other specified diseases of blood and blood-forming organs: Secondary | ICD-10-CM

## 2016-12-31 LAB — CBC WITH DIFFERENTIAL/PLATELET
Basophils Absolute: 0.1 10*3/uL (ref 0.0–0.1)
Basophils Relative: 0.6 % (ref 0.0–3.0)
EOS PCT: 1.9 % (ref 0.0–5.0)
Eosinophils Absolute: 0.2 10*3/uL (ref 0.0–0.7)
HCT: 45.8 % (ref 36.0–46.0)
Hemoglobin: 15.7 g/dL — ABNORMAL HIGH (ref 12.0–15.0)
LYMPHS ABS: 3.9 10*3/uL (ref 0.7–4.0)
Lymphocytes Relative: 32.5 % (ref 12.0–46.0)
MCHC: 34.4 g/dL (ref 30.0–36.0)
MCV: 101 fl — AB (ref 78.0–100.0)
MONOS PCT: 5.5 % (ref 3.0–12.0)
Monocytes Absolute: 0.7 10*3/uL (ref 0.1–1.0)
NEUTROS ABS: 7.2 10*3/uL (ref 1.4–7.7)
NEUTROS PCT: 59.5 % (ref 43.0–77.0)
PLATELETS: 242 10*3/uL (ref 150.0–400.0)
RBC: 4.53 Mil/uL (ref 3.87–5.11)
RDW: 12.7 % (ref 11.5–15.5)
WBC: 12 10*3/uL — ABNORMAL HIGH (ref 4.0–10.5)

## 2016-12-31 LAB — COMPREHENSIVE METABOLIC PANEL
ALT: 16 U/L (ref 0–35)
AST: 19 U/L (ref 0–37)
Albumin: 4.3 g/dL (ref 3.5–5.2)
Alkaline Phosphatase: 112 U/L (ref 39–117)
BUN: 13 mg/dL (ref 6–23)
CHLORIDE: 111 meq/L (ref 96–112)
CO2: 26 mEq/L (ref 19–32)
Calcium: 9.6 mg/dL (ref 8.4–10.5)
Creatinine, Ser: 0.74 mg/dL (ref 0.40–1.20)
GFR: 84.66 mL/min (ref >60.00)
GLUCOSE: 91 mg/dL (ref 70–99)
POTASSIUM: 4 meq/L (ref 3.5–5.1)
SODIUM: 141 meq/L (ref 135–145)
Total Bilirubin: 0.5 mg/dL (ref 0.2–1.2)
Total Protein: 7.1 g/dL (ref 6.0–8.3)

## 2016-12-31 LAB — IRON AND TIBC
%SAT: 53 % — ABNORMAL HIGH (ref 11–50)
IRON: 194 ug/dL — AB (ref 45–160)
TIBC: 368 ug/dL (ref 250–450)
UIBC: 174 ug/dL (ref 125–400)

## 2016-12-31 LAB — T4, FREE: Free T4: 1.05 ng/dL (ref 0.60–1.60)

## 2016-12-31 LAB — TSH: TSH: 0.38 u[IU]/mL (ref 0.35–4.50)

## 2016-12-31 LAB — LIPID PANEL
CHOL/HDL RATIO: 3
CHOLESTEROL: 159 mg/dL (ref 0–200)
HDL: 63.2 mg/dL (ref >39.00)
LDL Cholesterol: 77 mg/dL (ref 0–99)
NonHDL: 95.3
TRIGLYCERIDES: 93 mg/dL (ref 0.0–149.0)
VLDL: 18.6 mg/dL (ref 0.0–40.0)

## 2016-12-31 LAB — FERRITIN: Ferritin: 158.3 ng/mL (ref 10.0–291.0)

## 2016-12-31 LAB — VITAMIN D 25 HYDROXY (VIT D DEFICIENCY, FRACTURES): VITD: 26.33 ng/mL — ABNORMAL LOW (ref 30.00–100.00)

## 2016-12-31 MED ORDER — ROSUVASTATIN CALCIUM 20 MG PO TABS: 20.0000 mg | ORAL_TABLET | Freq: Every day | ORAL | 3 refills | Status: DC

## 2016-12-31 MED ORDER — ESCITALOPRAM OXALATE 20 MG PO TABS: 20.0000 mg | ORAL_TABLET | Freq: Every day | ORAL | 3 refills | Status: DC

## 2016-12-31 MED ORDER — ALPRAZOLAM 1 MG PO TABS: 1.0000 mg | ORAL_TABLET | Freq: Every evening | ORAL | 5 refills | Status: DC | PRN

## 2016-12-31 NOTE — Patient Instructions (Addendum)
The ShingRx vaccine will be available in about 6 months and IS ADVISED for all interested adults over 50 to prevent shingles  You may have an inherited condition that raises your hemoglobin .  I am checking you for today for hemochromatosis  I f you want to be vaccinated against Hepatitis A and B, check with your pharmacy about the cost ( 3 injections needed)     We will  switch from paxil to lexapro  20 mg daily (higher dose )

## 2016-12-31 NOTE — Progress Notes (Signed)
Subjective:  Patient ID: Tracie Morris, female    DOB: 1956-04-18  Age: 61 y.o. MRN: [id number]  CC: The primary encounter diagnosis was Iatrogenic hypothyroidism. Diagnoses of Hyperlipidemia, unspecified hyperlipidemia type, Dyspnea, unspecified type, Vitamin D deficiency, Fatigue, unspecified type, Elevated hemoglobin (HCC), Elevated liver enzymes, S/P Nissen fundoplication (with gastrostomy tube placement) ([place]), Peripheral vascular disease with pain at rest [facility]), Insomnia, persistent, Adrenal mass, left ([place]), Macrocytosis without anemia, GAD (generalized anxiety disorder), Solitary pulmonary nodule, and Coronary artery disease due to lipid rich plaque were also pertinent to this visit.  HPI Tracie Morris presents for follow up on multiple chronic conditions including hyperlipidemia,  GAD  And PAD on Plavix, ASA  Since her last visit the following events transpired:  She had a prolonged viral respiratory  illness while vacationing with friends  at the beach in September.  The recovery was complicated by otitis which was treated at walk in clinic with antibitoics, cough suppressant and prednisone taper .      She developed sudden onset of  claudication in the left leg in early December  while exercising .  Given her history of PAD with  S/p left common iliac artery stent in Nov 2014, she was seen urgently by Dr Fletcher Anon and an  In stent thrombosis was found which was treated with another stent and longterm dual antiplatelet therapy (asa and Plavix) which will be lifelong.  Calcifications were also noted in the distal aorta and right common iliac artery disease and has been noted in all 3 coronary arteries.  Post procedure development of cyanotic toes and pain with loss of DP pulse has persisted but not worsened,  And she is waling on the treadmill daily as directed and will follow up one month . Patient quit smoking 5 years ago but admits that on rare occasions, (when she is enjoying a  cocktail), she smokes , and this behavior did occur in September.   Carotid doppler scheduled for  feb 19th . She is taking crestor, but last LDL was above goal. LDL goal is 70,  On crestor 20 mg .  She has been more fatigued than usual, without exertional dyspnea and chest pain or weight loss.  Has chronic insomnia, but is requesting evaluation of thyroid function  Prior to follow up with  dr [name].     Outpatient Medications Prior to Visit  Medication Sig Dispense Refill  . aspirin 81 MG tablet Take 1 tablet (81 mg total) by mouth daily. 30 tablet   . clopidogrel (PLAVIX) 75 MG tablet Take 1 tablet (75 mg total) by mouth daily. 30 tablet 5  . levothyroxine (SYNTHROID, LEVOTHROID) 112 MCG tablet Take 1 tablet (112 mcg total) by mouth daily. 90 tablet 3  . Multiple Vitamin (MULTIVITAMIN WITH MINERALS) TABS tablet Take 1 tablet by mouth daily.    . rizatriptan (MAXALT-MLT) 10 MG disintegrating tablet TAKE 1 TABLET BY MOUTH AS NEEDED MAY REPEAT IN 2 HOURS IF NEEDED (Patient taking differently: TAKE 1 TABLET BY MOUTH AS NEEDED MAY REPEAT IN 2 HOURS IF NEEDED FOR MIGRAINES) 10 tablet 0  . ALPRAZolam (XANAX) 0.5 MG tablet TAKE ONE TABLET BY MOUTH AT BEDTIME AS NEEDED FOR ANXIETY 30 tablet 1  . Cholecalciferol (VITAMIN D-3) 1000 units CAPS Take 1,000 Units by mouth daily.    Marland Kitchen PARoxetine (PAXIL) 10 MG tablet Take 1 tablet (10 mg total) by mouth daily. 90 tablet 1  . rosuvastatin (CRESTOR) 20 MG tablet Take 1 tablet (20 mg  total) by mouth daily. 90 tablet 3   No facility-administered medications prior to visit.     Review of Systems;  Patient denies headache, fevers, malaise, unintentional weight loss, skin rash, eye pain, sinus congestion and sinus pain, sore throat, dysphagia,  hemoptysis , cough, dyspnea, wheezing, chest pain, palpitations, orthopnea, edema, abdominal pain, nausea, melena, diarrhea, constipation, flank pain, dysuria, hematuria, urinary  Frequency, nocturia, numbness, tingling,  seizures,  Focal weakness, Loss of consciousness,  Tremor, insomnia, depression, anxiety, and suicidal ideation.      Objective:  BP 132/80   Pulse 88   Resp 16   Wt 124 lb (56.2 kg)   SpO2 96%   BMI 19.13 kg/m   BP Readings from Last 3 Encounters:  12/31/16 132/80  12/24/16 120/60  11/14/16 128/66    Wt Readings from Last 3 Encounters:  12/31/16 124 lb (56.2 kg)  12/24/16 124 lb 5 oz (56.4 kg)  11/14/16 124 lb (56.2 kg)    General appearance: alert, cooperative and appears stated age Ears: normal TM's and external ear canals both ears Throat: lips, mucosa, and tongue normal; teeth and gums normal Neck: no adenopathy, no carotid bruit, supple, symmetrical, trachea midline and thyroid not enlarged, symmetric, no tenderness/mass/nodules Back: symmetric, no curvature. ROM normal. No CVA tenderness. Lungs: clear to auscultation bilaterally Heart: regular rate and rhythm, S1, S2 normal, no murmur, click, rub or gallop Abdomen: soft, non-tender; bowel sounds normal; no masses,  no organomegaly Pulses: 2+ and symmetric Skin: Skin color, texture, turgor normal. No rashes or lesions Lymph nodes: Cervical, supraclavicular, and axillary nodes normal.  No results found for: HGBA1C  Lab Results  Component Value Date   CREATININE 0.74 12/31/2016   CREATININE 0.69 11/05/2016   CREATININE 0.76 04/09/2016    Lab Results  Component Value Date   WBC 12.0 (H) 12/31/2016   HGB 15.7 (H) 12/31/2016   HCT 45.8 12/31/2016   PLT 242.0 12/31/2016   GLUCOSE 91 12/31/2016   CHOL 159 12/31/2016   TRIG 93.0 12/31/2016   HDL 63.20 12/31/2016   LDLCALC 77 12/31/2016   ALT 16 12/31/2016   AST 19 12/31/2016   NA 141 12/31/2016   K 4.0 12/31/2016   CL 111 12/31/2016   CREATININE 0.74 12/31/2016   BUN 13 12/31/2016   CO2 26 12/31/2016   TSH 0.38 12/31/2016   INR 1.0 11/05/2016   MICROALBUR 0.6 01/11/2014    No results found.  Assessment & Plan:   Problem List Items Addressed This  Visit    Adrenal mass, left (HCC)    Nonfunctioning by prior endocrine workup and PET scanning.  Continue annual surveillance imaging with dr. [name].      Coronary artery disease due to lipid rich plaque    3 vessel, noted on  noninvasive studies/imaging . Continue asa, plavix increase crestor to 40 mg daily . Given c/o fatigue , addition of low dose beta blocker vs low does ARB to be considered pending review of home bp readings       Relevant Medications   rosuvastatin (CRESTOR) 40 MG tablet   RESOLVED: Dyspnea   Elevated hemoglobin (HCC)    ETIOLOGY MAY BE OCCULT ToBACCO ABUSE vs HH given father's history  Of elevated hgb as a non smoker.  Repeat cbc with HH PCR notes slight drop in hgb , mild leukocytosis and elevated mcv.  Will have her return for b12/folate and repeat wbc in 2 weeks   Lab Results  Component Value Date  WBC 12.0 (H) 12/31/2016   HGB 15.7 (H) 12/31/2016   HCT 45.8 12/31/2016   MCV 101.0 (H) 12/31/2016   PLT 242.0 12/31/2016         Relevant Orders   Hemochromatosis DNA-PCR(c282y,h63d)   Iron and TIBC   Ferritin (Completed)   CBC with Differential/Platelet   Erythropoietin   GAD (generalized anxiety disorder)    She does not feel that the paxil is providing relief of anxiety and is requesting alternative,  lexapro trial discussed      Hyperlipidemia    LDL is 77 on crestor 20 mg. Will increase dose to 30 mg.   Lab Results  Component Value Date   CHOL 159 12/31/2016   HDL 63.20 12/31/2016   LDLCALC 77 12/31/2016   TRIG 93.0 12/31/2016   CHOLHDL 3 12/31/2016   Lab Results  Component Value Date   LDLCALC 77 12/31/2016         Relevant Medications   rosuvastatin (CRESTOR) 40 MG tablet   Other Relevant Orders   Lipid panel (Completed)   Iatrogenic hypothyroidism - Primary    Her tsh is almost suppressed (compared to previous level) but her T4 is lower as well . She has had no involuntary weight loss and pulse is normal  No changes today,   Follow up with Dr [name] in March  Lab Results  Component Value Date   TSH 0.38 12/31/2016         Relevant Orders   TSH (Completed)   T4, free (Completed)   Insomnia, persistent    dc'd ambien.  Did not tolerate remeron. continue alprazolam qhs       Peripheral vascular disease with pain at rest [facility])    In stent thrombosis of left common iliac artery was addressed with second stent dec 2018 by [place].  Loss of DP pulse due to embolization was noted, with development of pain and cyanosis in toes.  Patient is tolerating exercise with no worsening of symptoms and will follow up with [facility] as planned for repeat evaluation.  Continue asa and plavix longterm.  Advised patient to [place] , and avoid any elective procedures for one year that would require temporary suspension of plavix.  Repeating lipids for goal LDL < 70 .      S/P Nissen fundoplication (with gastrostomy tube placement) ([place])    She is currently asymptomatic      Solitary pulmonary nodule    Stable, No change by June 2017 chest CT        Other Visit Diagnoses    Vitamin D deficiency       Relevant Orders   VITAMIN D 25 Hydroxy (Vit-D Deficiency, Fractures) (Completed)   Fatigue, unspecified type       Relevant Orders   Comprehensive metabolic panel (Completed)   CBC with Differential/Platelet (Completed)   Elevated liver enzymes       Relevant Orders   Hepatitis B surface antibody (Completed)   Hepatitis A Ab, Total (Completed)   Macrocytosis without anemia       Relevant Orders   B12   Folate RBC   Methylmalonic Acid     A total of 40 minutes was spent with patient more than half of which was spent in counseling patient on the above mentioned issues , reviewing and explaining recent labs and imaging studies done, and coordination of care. I have discontinued Ms. Peine's rosuvastatin, PARoxetine, Vitamin D-3, and rosuvastatin. I have also changed her ALPRAZolam and escitalopram.  Additionally, I am  having her start on rosuvastatin. Lastly, I am having her maintain her aspirin, rizatriptan, levothyroxine, clopidogrel, and multivitamin with minerals.  Meds ordered this encounter  Medications  . DISCONTD: rosuvastatin (CRESTOR) 20 MG tablet    Sig: Take 1 tablet (20 mg total) by mouth daily.    Dispense:  90 tablet    Refill:  3  . ALPRAZolam (XANAX) 1 MG tablet    Sig: Take 1 tablet (1 mg total) by mouth at bedtime as needed for anxiety.    Dispense:  30 tablet    Refill:  5  . escitalopram (LEXAPRO) 20 MG tablet    Sig: Take 1 tablet (20 mg total) by mouth daily.    Dispense:  30 tablet    Refill:  3  . rosuvastatin (CRESTOR) 40 MG tablet    Sig: Take 1 tablet (40 mg total) by mouth daily.    Dispense:  90 tablet    Refill:  3    REPLACES PRIOR DOSE,  IF PATIENT HAS PICKED UP THE 20 MG ,  KEEP ON FILE FOR NEXT REFILL    Medications Discontinued During This Encounter  Medication Reason  . rosuvastatin (CRESTOR) 20 MG tablet Reorder  . ALPRAZolam (XANAX) 0.5 MG tablet Reorder  . Cholecalciferol (VITAMIN D-3) 1000 units CAPS   . PARoxetine (PAXIL) 10 MG tablet   . rosuvastatin (CRESTOR) 20 MG tablet     Follow-up: No Follow-up on file.   [name], MD

## 2016-12-31 NOTE — Progress Notes (Signed)
Pre visit review using our clinic review tool, if applicable. No additional management support is needed unless otherwise documented below in the visit note. 

## 2017-01-01 DIAGNOSIS — I2583 Coronary atherosclerosis due to lipid rich plaque: Secondary | ICD-10-CM

## 2017-01-01 DIAGNOSIS — D582 Other hemoglobinopathies: Secondary | ICD-10-CM | POA: Insufficient documentation

## 2017-01-01 DIAGNOSIS — I2510 Atherosclerotic heart disease of native coronary artery without angina pectoris: Secondary | ICD-10-CM | POA: Insufficient documentation

## 2017-01-01 LAB — HEPATITIS B SURFACE ANTIBODY,QUALITATIVE: HEP B S AB: NEGATIVE

## 2017-01-01 LAB — HEPATITIS A ANTIBODY, TOTAL: HEP A TOTAL AB: NONREACTIVE

## 2017-01-01 MED ORDER — ROSUVASTATIN CALCIUM 40 MG PO TABS: 40.0000 mg | ORAL_TABLET | Freq: Every day | ORAL | 3 refills | Status: DC

## 2017-01-01 NOTE — Assessment & Plan Note (Signed)
Stable, No change by June 2017 chest CT

## 2017-01-01 NOTE — Assessment & Plan Note (Signed)
Nonfunctioning by prior endocrine workup and PET scanning.  Continue annual surveillance imaging with dr. gherge. 

## 2017-01-01 NOTE — Assessment & Plan Note (Signed)
dc'd [place].  Did not tolerate remeron. continue alprazolam qhs

## 2017-01-01 NOTE — Assessment & Plan Note (Signed)
LDL is 77 on crestor 20 mg. Will increase dose to 30 mg.   Lab Results  Component Value Date   CHOL 159 12/31/2016   HDL 63.20 12/31/2016   LDLCALC 77 12/31/2016   TRIG 93.0 12/31/2016   CHOLHDL 3 12/31/2016   Lab Results  Component Value Date   LDLCALC 77 12/31/2016

## 2017-01-01 NOTE — Assessment & Plan Note (Signed)
In stent thrombosis of left common iliac artery was addressed with second stent dec 2018 by [place].  Loss of DP pulse due to embolization was noted, with development of pain and cyanosis in toes.  Patient is tolerating exercise with no worsening of symptoms and will follow up with Arida as planned for repeat evaluation.  Continue asa and plavix longterm.  Advised patient to [place] , and avoid any elective procedures for one year that would require temporary suspension of plavix.  Repeating lipids for goal LDL < 70 .

## 2017-01-01 NOTE — Assessment & Plan Note (Signed)
ETIOLOGY MAY BE OCCULT ToBACCO ABUSE vs HH given father's history  Of elevated hgb as a non smoker.  Repeat cbc with HH PCR notes slight drop in hgb , mild leukocytosis and elevated mcv.  Will have her return for b12/folate and repeat wbc in 2 weeks   Lab Results  Component Value Date   WBC 12.0 (H) 12/31/2016   HGB 15.7 (H) 12/31/2016   HCT 45.8 12/31/2016   MCV 101.0 (H) 12/31/2016   PLT 242.0 12/31/2016

## 2017-01-01 NOTE — Assessment & Plan Note (Addendum)
Her tsh is almost suppressed (compared to previous level) but her T4 is lower as well . She has had no involuntary weight loss and pulse is normal  No changes today,  Follow up with Dr [name] in March  Lab Results  Component Value Date   TSH 0.38 12/31/2016

## 2017-01-01 NOTE — Assessment & Plan Note (Signed)
She is currently asymptomatic 

## 2017-01-01 NOTE — Assessment & Plan Note (Signed)
She does not feel that the paxil is providing relief of anxiety and is requesting alternative,  lexapro trial discussed

## 2017-01-01 NOTE — Assessment & Plan Note (Addendum)
3 vessel, noted on  noninvasive studies/imaging . Continue asa, plavix increase crestor to 40 mg daily . Given c/o fatigue , addition of low dose beta blocker vs low does ARB to be considered pending review of home bp readings

## 2017-01-02 ENCOUNTER — Encounter: Payer: Self-pay | Admitting: Internal Medicine

## 2017-01-05 ENCOUNTER — Other Ambulatory Visit: Payer: Self-pay | Admitting: Internal Medicine

## 2017-01-05 ENCOUNTER — Encounter: Payer: Self-pay | Admitting: Internal Medicine

## 2017-01-05 LAB — HEMOCHROMATOSIS DNA-PCR(C282Y,H63D)

## 2017-01-05 NOTE — Progress Notes (Signed)
amb  

## 2017-01-09 ENCOUNTER — Other Ambulatory Visit (INDEPENDENT_AMBULATORY_CARE_PROVIDER_SITE_OTHER): Payer: BC Managed Care – PPO

## 2017-01-09 DIAGNOSIS — D582 Other hemoglobinopathies: Secondary | ICD-10-CM

## 2017-01-09 DIAGNOSIS — D7589 Other specified diseases of blood and blood-forming organs: Secondary | ICD-10-CM | POA: Diagnosis not present

## 2017-01-09 LAB — CBC WITH DIFFERENTIAL/PLATELET
BASOS PCT: 1.2 % (ref 0.0–3.0)
Basophils Absolute: 0.2 10*3/uL — ABNORMAL HIGH (ref 0.0–0.1)
EOS ABS: 0.2 10*3/uL (ref 0.0–0.7)
EOS PCT: 1.9 % (ref 0.0–5.0)
HEMATOCRIT: 46.3 % — AB (ref 36.0–46.0)
HEMOGLOBIN: 15.8 g/dL — AB (ref 12.0–15.0)
LYMPHS PCT: 25.5 % (ref 12.0–46.0)
Lymphs Abs: 3.3 10*3/uL (ref 0.7–4.0)
MCHC: 34.2 g/dL (ref 30.0–36.0)
MCV: 100.9 fl — AB (ref 78.0–100.0)
Monocytes Absolute: 0.7 10*3/uL (ref 0.1–1.0)
Monocytes Relative: 5.6 % (ref 3.0–12.0)
Neutro Abs: 8.5 10*3/uL — ABNORMAL HIGH (ref 1.4–7.7)
Neutrophils Relative %: 65.8 % (ref 43.0–77.0)
Platelets: 247 10*3/uL (ref 150.0–400.0)
RBC: 4.59 Mil/uL (ref 3.87–5.11)
RDW: 12.8 % (ref 11.5–15.5)
WBC: 13 10*3/uL — AB (ref 4.0–10.5)

## 2017-01-09 LAB — VITAMIN B12: Vitamin B-12: 406 pg/mL (ref 211–911)

## 2017-01-10 LAB — FOLATE RBC: RBC FOLATE: 743 ng/mL (ref >280)

## 2017-01-11 LAB — ERYTHROPOIETIN: ERYTHROPOIETIN: 8.1 m[IU]/mL (ref 2.6–18.5)

## 2017-01-14 ENCOUNTER — Telehealth: Payer: Self-pay | Admitting: Radiology

## 2017-01-14 ENCOUNTER — Ambulatory Visit: Payer: BC Managed Care – PPO

## 2017-01-14 DIAGNOSIS — R0989 Other specified symptoms and signs involving the circulatory and respiratory systems: Secondary | ICD-10-CM | POA: Diagnosis not present

## 2017-01-14 DIAGNOSIS — I739 Peripheral vascular disease, unspecified: Principal | ICD-10-CM

## 2017-01-14 DIAGNOSIS — I779 Disorder of arteries and arterioles, unspecified: Secondary | ICD-10-CM

## 2017-01-14 LAB — VAS US CAROTID
LCCAPDIAS: 14 cm/s
LEFT ECA DIAS: -12 cm/s
LEFT VERTEBRAL DIAS: -9 cm/s
LICAPDIAS: -13 cm/s
Left CCA dist dias: -12 cm/s
Left CCA dist sys: -92 cm/s
Left CCA prox sys: 117 cm/s
Left ICA dist dias: -19 cm/s
Left ICA dist sys: -88 cm/s
Left ICA prox sys: -58 cm/s
RCCADSYS: -104 cm/s
RCCAPDIAS: 14 cm/s
RIGHT ECA DIAS: -12 cm/s
RIGHT VERTEBRAL DIAS: -12 cm/s
Right CCA prox sys: 102 cm/s

## 2017-01-14 NOTE — Telephone Encounter (Signed)
Just FYI, pt RBC folate and Erythropoietin tests have resulted. Methylmalonic Acid is still pending due to being a send out test. Tests will all result together.

## 2017-01-16 ENCOUNTER — Encounter: Payer: Self-pay | Admitting: Internal Medicine

## 2017-01-16 LAB — METHYLMALONIC ACID, SERUM: Methylmalonic Acid, Quant: 330 nmol/L — ABNORMAL HIGH (ref 87–318)

## 2017-01-21 ENCOUNTER — Ambulatory Visit (INDEPENDENT_AMBULATORY_CARE_PROVIDER_SITE_OTHER): Payer: BC Managed Care – PPO | Admitting: Cardiovascular Disease

## 2017-01-21 ENCOUNTER — Encounter: Payer: Self-pay | Admitting: Cardiovascular Disease

## 2017-01-21 VITALS — BP 114/60 | HR 94 | Ht 68.0 in | Wt 124.0 lb

## 2017-01-21 DIAGNOSIS — E785 Hyperlipidemia, unspecified: Secondary | ICD-10-CM

## 2017-01-21 DIAGNOSIS — I779 Disorder of arteries and arterioles, unspecified: Secondary | ICD-10-CM

## 2017-01-21 DIAGNOSIS — I739 Peripheral vascular disease, unspecified: Secondary | ICD-10-CM | POA: Diagnosis not present

## 2017-01-21 NOTE — Progress Notes (Signed)
Cardiology Office Note   Date:  01/21/2017   ID:  Tracie Morris, DOB [birth year], MRN [id number]  PCP:  Tracie Mc, MD  Cardiologist:   Tracie Sacramento, MD   Chief Complaint  Patient presents with  . other    1 month f/u carotid US. Meds reviewed verbally with pt.      History of Present Illness: Tracie Morris is a 61 y.o. female who presents for a followup visit regarding peripheral arterial disease.  She is status post left common iliac artery stent placement in November of 2014 for significant claudication. Other Medical history include previous tobacco use and hyperlipidemia  She was seen in December for sudden onset of left leg cramping and discomfort with minimal walking. Noninvasive vascular evaluation showed a drop in her ABI 0.69 on the left with evidence of severe stenosis affecting the left common iliac artery. Previous vascular studies in 2016 were overall unremarkable. I proceeded with angiography which showed subtotal occlusion at the distal edge of the previously placed stent in the left common iliac artery with what seems to be in situ thrombosis. I proceeded with successful covered stent placement without residual stenosis. There was mild to moderate disease affecting the distal aorta and right common iliac artery.   Embolization into the dorsalis pedis was suspected given the weak pulse that persisted in that area. ABI nonetheless improved to normal. She has been exercising regularly with no limitations. She reports gradual improvement in left foot numbness. She does have cyanosis in both feet but more obvious on the left foot. Recent testing indicated elevated hemoglobin with possible hemochromatosis. Previous CT scan of the chest did show calcifications in the coronary arteries.   Past Medical History:  Diagnosis Date  . Carotid artery occlusion   . Hyperlipidemia   . Hypocalcemia   . hypothyroidism 2007   acquired, secondary to surgery for thyroid Ca   . Migraine    improved since thyroid surgery, 2 years  . multinodular goiter 2007   s/p thyroidectomy  . 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 Repeat angiography in November of 2014 showed 50% distal aortic stenosis extending into the left common iliac artery which had 60-70% ostial stenosis. No other significant disease. I placed on balloon expandable stent into  left common iliac artery extending slightly into the distal aorta    Past Surgical History:  Procedure Laterality Date  . ABDOMINAL AORTAGRAM N/A 10/14/2013   Procedure: ABDOMINAL Tracie Morris;  Surgeon: Tracie Hampshire, MD;  Location: [facility] CATH LAB;  Service: Cardiovascular;  Laterality: N/A;  . ANGIOPLASTY ILLIAC ARTERY    . CESAREAN SECTION    . NOSE SURGERY    . PERIPHERAL VASCULAR CATHETERIZATION N/A 11/14/2016   Procedure: Abdominal Aortogram;  Surgeon: Tracie Hampshire, MD;  Location: [place] CV LAB;  Service: Cardiovascular;  Laterality: N/A;  . PERIPHERAL VASCULAR CATHETERIZATION Bilateral 11/14/2016   Procedure: Lower Extremity Angiography;  Surgeon: Tracie Hampshire, MD;  Location: [place] CV LAB;  Service: Cardiovascular;  Laterality: Bilateral;  limited iliacs only  . PERIPHERAL VASCULAR CATHETERIZATION Left 11/14/2016   Procedure: Peripheral Vascular Intervention;  Surgeon: Tracie Hampshire, MD;  Location: [place] [facility] CV LAB;  Service: Cardiovascular;  Laterality: Left;  common iliac  . SKIN CANCER EXCISION    . THYROID SURGERY  2007   secondary to thyroid cancer     Current  Outpatient Prescriptions  Medication Sig Dispense Refill  . ALPRAZolam (XANAX) 1 MG tablet Take 1 tablet (1 mg total) by mouth at bedtime as needed for anxiety. 30 tablet 5  . aspirin 81 MG tablet Take 1 tablet (81 mg total) by mouth daily. 30 tablet   . clopidogrel (PLAVIX) 75 MG tablet Take 1 tablet (75 mg total) by mouth  daily. 30 tablet 5  . escitalopram (LEXAPRO) 20 MG tablet Take 1 tablet (20 mg total) by mouth daily. 30 tablet 3  . levothyroxine (SYNTHROID, LEVOTHROID) 112 MCG tablet Take 1 tablet (112 mcg total) by mouth daily. 90 tablet 3  . Multiple Vitamin (MULTIVITAMIN WITH MINERALS) TABS tablet Take 1 tablet by mouth daily.    . rizatriptan (MAXALT-MLT) 10 MG disintegrating tablet TAKE 1 TABLET BY MOUTH AS NEEDED MAY REPEAT IN 2 HOURS IF NEEDED (Patient taking differently: TAKE 1 TABLET BY MOUTH AS NEEDED MAY REPEAT IN 2 HOURS IF NEEDED FOR MIGRAINES) 10 tablet 0  . rosuvastatin (CRESTOR) 40 MG tablet Take 1 tablet (40 mg total) by mouth daily. 90 tablet 3   No current facility-administered medications for this visit.     Allergies:   Atorvastatin; Darvocet [propoxyphene n-acetaminophen]; Demerol; Methocarbamol; Mirtazapine; and Ambien [zolpidem tartrate]    Social History:  The patient  reports that she quit smoking about 5 years ago. Her smoking use included Cigarettes. She has a 20.00 pack-year smoking history. She has never used smokeless tobacco. She reports that she does not drink alcohol or use drugs.   Family History:  The patient's family history includes Alzheimer's disease in her father; Coronary artery disease in her maternal grandmother; Coronary artery disease (age of onset: 11) in her paternal grandmother; Heart disease (age of onset: 75) in her maternal grandmother; Heart disease (age of onset: 64) in her paternal grandmother; Stroke in her mother.    ROS:  Please see the history of present illness.   Otherwise, review of systems are positive for none.   All other systems are reviewed and negative.    PHYSICAL EXAM: VS:  BP 114/60 (BP Location: Left Arm, Patient Position: Sitting, Cuff Size: Normal)   Pulse 94   Ht 5\' 8"  (1.727 m)   Wt 124 lb (56.2 kg)   BMI 18.85 kg/m  , BMI Body mass index is 18.85 kg/m. GEN: Well nourished, well developed, in no acute distress  HEENT: normal   Neck: no JVD, carotid bruits, or masses Cardiac: RRR; no murmurs, rubs, or gallops,no edema  Respiratory:  clear to auscultation bilaterally, normal work of breathing GI: soft, nontender, nondistended, + BS MS: no deformity or atrophy  Skin: warm and dry, no rash Neuro:  Strength and sensation are intact Psych: euthymic mood, full affect Vascular: Femoral pulse: Normal bilaterally . Posterior tibial is normal bilaterally. Dorsalis pedis is normal on the right side and +1 on the left side. No ulceration  EKG:  EKG is not ordered today.    Recent Labs: 12/31/2016: ALT 16; BUN 13; Creatinine, Ser 0.74; Potassium 4.0; Sodium 141; TSH 0.38 01/09/2017: Hemoglobin 15.8; Platelets 247.0    Lipid Panel    Component Value Date/Time   CHOL 159 12/31/2016 1144   TRIG 93.0 12/31/2016 1144   HDL 63.20 12/31/2016 1144   CHOLHDL 3 12/31/2016 1144   VLDL 18.6 12/31/2016 1144   LDLCALC 77 12/31/2016 1144      Wt Readings from Last 3 Encounters:  01/21/17 124 lb (56.2 kg)  12/31/16 124 lb (56.2 kg)  12/24/16 124 lb 5 oz (56.4 kg)        ASSESSMENT AND PLAN:  1.  Peripheral arterial disease: Status post left common iliac artery covered stent placement due to in situ thrombosis. Continue lifelong dual antiplatelet therapy. Left foot numbness improved significantly and she does have a palpable left dorsalis pedis now. Continue medical therapy. The patient might have hypercoagulable state that led to recent in situ thrombosis.  2. Hyperlipidemia:  Rosuvastatin was increased to 40 mg daily with improvement in LDL 277.  3. Carotid disease: This was mild on recent carotid Doppler.  4. Coronary calcifications: No anginal symptoms. Continue aggressive treatment of risk factors.   Disposition:   FU with me in 1 month  Signed,  Tracie Sacramento, MD  01/21/2017 1:40 PM    [facility]

## 2017-01-21 NOTE — Patient Instructions (Signed)
Medication Instructions:  Your physician recommends that you continue on your current medications as directed. Please refer to the Current Medication list given to you today.   Labwork: none  Testing/Procedures: Your physician has requested that you have an abdominal aorta duplex, end of June. During this test, an ultrasound is used to evaluate the aorta. Allow 30 minutes for this exam. Do not eat after midnight the day before and avoid carbonated beverages  Your physician has requested that you have an ankle brachial index (ABI), end of June. During this test an ultrasound and blood pressure cuff are used to evaluate the arteries that supply the arms and legs with blood. Allow thirty minutes for this exam. There are no restrictions or special instructions.    Follow-Up: Your physician wants you to follow-up in 4 months with Dr. Fletcher Anon.   You will receive a reminder letter in the mail two months in advance. If you don't receive a letter, please call our office to schedule the follow-up appointment.   Any Other Special Instructions Will Be Listed Below (If Applicable).     If you need a refill on your cardiac medications before your next appointment, please call your pharmacy.

## 2017-01-24 ENCOUNTER — Encounter: Payer: Self-pay | Admitting: Internal Medicine

## 2017-01-25 MED ORDER — VITAMIN B-12 1000 MCG PO TABS: 1000.0000 ug | ORAL_TABLET | Freq: Every day | ORAL | 1 refills | Status: DC

## 2017-01-29 ENCOUNTER — Encounter: Payer: Self-pay | Admitting: Oncology

## 2017-01-29 ENCOUNTER — Inpatient Hospital Stay: Payer: BC Managed Care – PPO | Attending: Oncology | Admitting: Oncology

## 2017-01-29 DIAGNOSIS — Z79899 Other long term (current) drug therapy: Secondary | ICD-10-CM

## 2017-01-29 DIAGNOSIS — E785 Hyperlipidemia, unspecified: Secondary | ICD-10-CM | POA: Diagnosis not present

## 2017-01-29 DIAGNOSIS — E890 Postprocedural hypothyroidism: Secondary | ICD-10-CM | POA: Diagnosis not present

## 2017-01-29 DIAGNOSIS — E039 Hypothyroidism, unspecified: Secondary | ICD-10-CM | POA: Diagnosis not present

## 2017-01-29 DIAGNOSIS — Z85850 Personal history of malignant neoplasm of thyroid: Secondary | ICD-10-CM | POA: Diagnosis not present

## 2017-01-29 DIAGNOSIS — Z7982 Long term (current) use of aspirin: Secondary | ICD-10-CM | POA: Diagnosis not present

## 2017-01-29 DIAGNOSIS — I739 Peripheral vascular disease, unspecified: Secondary | ICD-10-CM | POA: Diagnosis not present

## 2017-01-29 DIAGNOSIS — I6529 Occlusion and stenosis of unspecified carotid artery: Secondary | ICD-10-CM | POA: Insufficient documentation

## 2017-01-29 DIAGNOSIS — Z87891 Personal history of nicotine dependence: Secondary | ICD-10-CM | POA: Insufficient documentation

## 2017-01-29 NOTE — Progress Notes (Signed)
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  INTERVAL HISTORY: Patient is a 61 year old female who was noted to have an increasing hemoglobin on routine blood work. Subsequent workup revealed patient to be a compound heterozygote for hereditary hemochromatosis. She currently feels well and is asymptomatic. She has no neurologic complaints. She denies any recent fevers or illnesses. She has a good appetite and denies weight loss. She has no chest pain or shortness of breath. She denies any nausea, vomiting, constipation, or diarrhea. She has no urinary complaints. Patient feels at her baseline and offers no specific complaints today.  REVIEW OF SYSTEMS:   Review of Systems  Constitutional: Negative.  Negative for fever, malaise/fatigue and weight loss.  Respiratory: Negative.  Negative for cough and shortness of breath.   Cardiovascular: Negative.  Negative for chest pain and leg swelling.  Gastrointestinal: Negative.  Negative for abdominal pain.  Genitourinary: Negative.   Musculoskeletal: Negative.   Neurological: Negative.  Negative for weakness.  Psychiatric/Behavioral: Negative.  The patient is not nervous/anxious.     As per HPI. Otherwise, a complete review of systems is negative.  PAST MEDICAL HISTORY: Past Medical History:  Diagnosis Date  . Carotid artery occlusion   . Hyperlipidemia   . Hypocalcemia   . hypothyroidism 2007   acquired, secondary to surgery for thyroid Ca  . Migraine    improved since thyroid surgery, 2 years  . multinodular goiter 2007   s/p thyroidectomy  . Peripheral arterial disease (HCC)    Left common iliac artery occlusion status post  balloon angioplasty without stent placement in January of 2014. Repeat angiography in November of 2014 showed 50% distal aortic stenosis extending into the left common iliac artery which had 60-70% ostial stenosis. No other significant disease. I placed on balloon expandable stent into  left common iliac artery extending slightly into the distal aorta    PAST SURGICAL HISTORY: Past Surgical History:  Procedure Laterality Date  . ABDOMINAL AORTAGRAM N/A 10/14/2013   Procedure: ABDOMINAL [name];  Surgeon: Wellington Hampshire, MD;  Location: [place] CATH [facility];  Service: Cardiovascular;  Laterality: N/A;  . ANGIOPLASTY ILLIAC ARTERY    . CESAREAN SECTION    . NOSE SURGERY    . PERIPHERAL VASCULAR CATHETERIZATION N/A 11/14/2016   Procedure: Abdominal Aortogram;  Surgeon: Wellington Hampshire, MD;  Location: [place] [facility];  Service: Cardiovascular;  Laterality: N/A;  . PERIPHERAL VASCULAR CATHETERIZATION Bilateral 11/14/2016   Procedure: Lower Extremity Angiography;  Surgeon: Wellington Hampshire, MD;  Location: [place] [facility];  Service: Cardiovascular;  Laterality: Bilateral;  limited iliacs only  . PERIPHERAL VASCULAR CATHETERIZATION Left 11/14/2016   Procedure: Peripheral Vascular Intervention;  Surgeon: Wellington Hampshire, MD;  Location: [place] [facility];  Service: Cardiovascular;  Laterality: Left;  common iliac  . SKIN CANCER EXCISION    . THYROID SURGERY  2007   secondary to thyroid cancer    FAMILY HISTORY: Family History  Problem Relation Age of Onset  . Alzheimer's disease Father   . Stroke Mother   . Coronary artery disease Maternal Grandmother   . Heart disease Maternal Grandmother 70  . Coronary artery disease Paternal Grandmother 76  . Heart disease Paternal Grandmother 17  ADVANCED DIRECTIVES (Y/N):  N  HEALTH MAINTENANCE: Social History  Substance Use Topics  . Smoking status: Former Smoker    Packs/day: 1.00    Years: 20.00    Types: Cigarettes    Quit date:  10/28/2011  . Smokeless tobacco: Never Used  . Alcohol use No     Colonoscopy:  PAP:  Bone density:  Lipid panel:  Allergies  Allergen Reactions  . Atorvastatin Other (See Comments)    Myalgias   . Darvocet [Propoxyphene N-Acetaminophen] Nausea And Vomiting  . Demerol Nausea And Vomiting  . Methocarbamol Other (See Comments)    UNKNOWN  . Mirtazapine     Sleeplessness, nausea   . Ambien [Zolpidem Tartrate] Palpitations    Anxiety , trembling.    Current Outpatient Prescriptions  Medication Sig Dispense Refill  . ALPRAZolam (XANAX) 1 MG tablet Take 1 tablet (1 mg total) by mouth at bedtime as needed for anxiety. 30 tablet 5  . aspirin 81 MG tablet Take 1 tablet (81 mg total) by mouth daily. 30 tablet   . clopidogrel (PLAVIX) 75 MG tablet Take 1 tablet (75 mg total) by mouth daily. 30 tablet 5  . escitalopram (LEXAPRO) 20 MG tablet Take 1 tablet (20 mg total) by mouth daily. 30 tablet 3  . levothyroxine (SYNTHROID, LEVOTHROID) 112 MCG tablet Take 1 tablet (112 mcg total) by mouth daily. 90 tablet 3  . Multiple Vitamin (MULTIVITAMIN WITH MINERALS) TABS tablet Take 1 tablet by mouth daily.    . rizatriptan (MAXALT-MLT) 10 MG disintegrating tablet TAKE 1 TABLET BY MOUTH AS NEEDED MAY REPEAT IN 2 HOURS IF NEEDED (Patient taking differently: TAKE 1 TABLET BY MOUTH AS NEEDED MAY REPEAT IN 2 HOURS IF NEEDED FOR MIGRAINES) 10 tablet 0  . rosuvastatin (CRESTOR) 40 MG tablet Take 1 tablet (40 mg total) by mouth daily. 90 tablet 3  . vitamin B-12 (CYANOCOBALAMIN) 1000 MCG tablet Take 1 tablet (1,000 mcg total) by mouth daily. 90 tablet 1   No current facility-administered medications for this visit.     OBJECTIVE: Vitals:   01/29/17 1112  BP: 133/71  Pulse: 75  Temp: 97.5 F (36.4 C)     Body mass index is 19.41 kg/m.    ECOG FS:0 - Asymptomatic  General: Well-developed, well-nourished, no acute distress. Eyes: Pink conjunctiva, anicteric sclera. HEENT: Normocephalic, moist  mucous membranes, clear oropharnyx. Lungs: Clear to auscultation bilaterally. Heart: Regular rate and rhythm. No rubs, murmurs, or gallops. Abdomen: Soft, nontender, nondistended. No organomegaly noted, normoactive bowel sounds. Musculoskeletal: No edema, cyanosis, or clubbing. Neuro: Alert, answering all questions appropriately. Cranial nerves grossly intact. Skin: No rashes or petechiae noted. Psych: Normal affect. Lymphatics: No cervical, calvicular, axillary or inguinal LAD.   LAB RESULTS:  Lab Results  Component Value Date   NA 141 12/31/2016   K 4.0 12/31/2016   CL 111 12/31/2016   CO2 26 12/31/2016   GLUCOSE 91 12/31/2016   BUN 13 12/31/2016   CREATININE 0.74 12/31/2016   CALCIUM 9.6 12/31/2016   PROT 7.1 12/31/2016   ALBUMIN 4.3 12/31/2016   AST 19 12/31/2016   ALT 16 12/31/2016   ALKPHOS 112 12/31/2016   BILITOT 0.5 12/31/2016   GFRNONAA 94 11/05/2016   GFRAA 109 11/05/2016    Lab Results  Component Value Date   WBC 13.0 (H) 01/09/2017   NEUTROABS 8.5 (H) 01/09/2017   HGB 15.8 (H) 01/09/2017   HCT 46.3 (H) 01/09/2017   MCV 100.9 (H) 01/09/2017   PLT 247.0 01/09/2017  Lab Results  Component Value Date   IRON 194 (H) 12/31/2016   TIBC 368 12/31/2016   IRONPCTSAT 53 (H) 12/31/2016   Lab Results  Component Value Date   FERRITIN 158.3 12/31/2016     STUDIES: No results found.  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  PLAN:    1. Hereditary hemochromatosis: Patient has an elevated hemoglobin as well as ferritin, therefore she will benefit from phlebotomy. Goal ferritin will be between 50 and 100. Return to clinic Monday 4 400 mL phlebotomy. Patient will then return to clinic in 2 months with repeat laboratory work, further evaluation, and consideration of additional phlebotomy.  Approximately 35 minutes was spent in discussion of which greater than 50% was consultation.  Patient expressed  understanding and was in agreement with this plan. She also understands that She can call clinic at any time with any questions, concerns, or complaints.    Lloyd Huger, MD   02/02/2017 7:00 PM

## 2017-02-04 ENCOUNTER — Inpatient Hospital Stay: Payer: BC Managed Care – PPO

## 2017-02-11 ENCOUNTER — Inpatient Hospital Stay: Payer: BC Managed Care – PPO

## 2017-02-18 ENCOUNTER — Encounter: Payer: Self-pay | Admitting: Internal Medicine

## 2017-02-18 ENCOUNTER — Ambulatory Visit (INDEPENDENT_AMBULATORY_CARE_PROVIDER_SITE_OTHER): Payer: BC Managed Care – PPO | Admitting: Internal Medicine

## 2017-02-18 VITALS — BP 138/82 | HR 79 | Wt 125.0 lb

## 2017-02-18 DIAGNOSIS — E279 Disorder of adrenal gland, unspecified: Secondary | ICD-10-CM

## 2017-02-18 DIAGNOSIS — E032 Hypothyroidism due to medicaments and other exogenous substances: Secondary | ICD-10-CM | POA: Diagnosis not present

## 2017-02-18 DIAGNOSIS — E278 Other specified disorders of adrenal gland: Secondary | ICD-10-CM

## 2017-02-18 LAB — TSH: TSH: 5.22 u[IU]/mL — ABNORMAL HIGH (ref 0.35–4.50)

## 2017-02-18 LAB — POTASSIUM: Potassium: 3.9 mEq/L (ref 3.5–5.1)

## 2017-02-18 LAB — T4, FREE: Free T4: 0.88 ng/dL (ref 0.60–1.60)

## 2017-02-18 MED ORDER — DEXAMETHASONE 1 MG PO TABS: ORAL_TABLET | ORAL | 0 refills | Status: DC

## 2017-02-18 NOTE — Progress Notes (Signed)
Patient ID: Tracie Morris, female   DOB: Oct 26, 1956, 61 y.o.   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 Last visit 1 year ago.  She was feeling tired >> saw PCP in 12/2016. She was found to have low vitamin D and B12. She started replacement and feels better. She was also found to have hemochromatosis. She is treated by hematology.  Pt's adrenal mass was incidentally found during investigation for kidney stones in 01/2015.  Abdominal CT from 06/2015: Left adrenal nodule of 9 mm measuring -11 HU.  PET scan on 08/02/2015 and this nodule was not hypermetabolic.  No h/o hypokalemia, HTN, hyperglycemia, spells of HA + HTN. She does have a h/o migraines, relieved by prn Plavix (!).   Reviewed previous investigations along with pt: urine metanephrines, normetanephrines and catecholamines were normal. Plasma reading activity, aldosterone, and dexamethasone suppression test were normal:  Component     Latest Ref Rng 08/23/2015 08/31/2015 01/16/2016 02/06/2016  Total Volume - CF 24Hr U         1400  Epinephrine, 24 hr Urine         REPORT  Norepinephrine, 24 hr Ur     15 - 100 mcg/24 h    19  Calculated Total (E+NE)     26 - 121 mcg/24 h    19 (L)  Dopamine, 24 hr Urine     52 - 480 mcg/24 h    116  Creatinine, Urine mg/day-CATEUR     0.63 - 2.50 g/24 h    0.65  Epinephrine      REPORT     Norepinephrine      534     Dopamine      REPORT     Catecholamines, Total      534     PRA LC/MS/MS     0.25 - 5.82 ng/mL/h 0.47  1.63   ALDO / PRA Ratio     0.9 - 28.9 Ratio 6.4  6.7   ALDOSTERONE      3  11   Metanephrine, Pl     <=57 pg/mL 38     Normetanephrine, Pl     <=148 pg/mL 151 (H)     Total Metanephrines-Plasma     <=205 pg/mL 189     Metanephrines, Ur     90 - 315 mcg/24 h    51 (L)  Normetanephr.,U,24h     122 - 676 mcg/24 h    128  Metanephrine, Ur     224 - 832  mcg/24 h    179 (L)  Potassium     3.5 - 5.1 mEq/L 4.0     Cortisol, Plasma       1.2 9.0    Postsurgical hypothyroidism: Patient had total thyroidectomy in 2007 for presumed thyroid cancer - Dr Carlis Abbott (ENT); 1 parathyroid removed. Final pathology after thyroidectomy >> benign.   - She is on levothyroxine 112 mcg (dose decreased in 09/2015).  - Latest TSH levels: Lab Results  Component Value Date   TSH 0.38 12/31/2016   TSH 1.81 02/20/2016   TSH 1.31 12/14/2015   TSH 0.32 (L) 10/07/2015   TSH 0.016 (L) 08/31/2015   TSH 0.10 (L) 08/31/2015   FREET4 1.05 12/31/2016   FREET4 1.33 02/20/2016   FREET4 1.32 12/14/2015   FREET4 1.53 10/07/2015   FREET4 1.20 08/31/2015   FREET4 1.56 08/23/2015  She is taking the levothyroxine: - Daily, not skipping doses - Fasting - In a.m. - With water - Eats breakfast 2h later - No PPI, calcium, iron - + multivitamins 2.5 h later (!)  ROS: Constitutional:no weight loss, + fatigue, no subjective hyperthermia, no poor sleep Eyes: no blurry vision, no xerophthalmia ENT: no sore throat, no nodules palpated in throat, no dysphagia/odynophagia, no hoarseness Cardiovascular: no CP/SOB/no palpitations/leg swelling Respiratory: no cough/SOB Gastrointestinal: no N/V/D/C Musculoskeletal: no muscle/+joint aches Skin: no rashes Neurological: + tremors - R > L hand/no numbness/tingling/dizziness  I reviewed pt's medications, allergies, PMH, social hx, family hx, and changes were documented in the history of present illness. Otherwise, unchanged from my initial visit note.  Past Medical History:  Diagnosis Date  . Carotid artery occlusion   . Hyperlipidemia   . Hypocalcemia   . hypothyroidism 2007   acquired, secondary to surgery for thyroid Ca  . Migraine    improved since thyroid surgery, 2 years  . multinodular goiter 2007   s/p thyroidectomy  . Peripheral arterial disease (HCC)    Left common iliac artery occlusion status post balloon  angioplasty without stent placement in January of 2014. Repeat angiography in November of 2014 showed 50% distal aortic stenosis extending into the left common iliac artery which had 60-70% ostial stenosis. No other significant disease. I placed on balloon expandable stent into  left common iliac artery extending slightly into the distal aorta   Past Surgical History:  Procedure Laterality Date  . ABDOMINAL AORTAGRAM N/A 10/14/2013   Procedure: ABDOMINAL [name];  Surgeon: Wellington Hampshire, MD;  Location: [place] CATH LAB;  Service: Cardiovascular;  Laterality: N/A;  . ANGIOPLASTY ILLIAC ARTERY    . CESAREAN SECTION    . NOSE SURGERY    . PERIPHERAL VASCULAR CATHETERIZATION N/A 11/14/2016   Procedure: Abdominal Aortogram;  Surgeon: Wellington Hampshire, MD;  Location: [place] [facility];  Service: Cardiovascular;  Laterality: N/A;  . PERIPHERAL VASCULAR CATHETERIZATION Bilateral 11/14/2016   Procedure: Lower Extremity Angiography;  Surgeon: Wellington Hampshire, MD;  Location: [place] [facility];  Service: Cardiovascular;  Laterality: Bilateral;  limited iliacs only  . PERIPHERAL VASCULAR CATHETERIZATION Left 11/14/2016   Procedure: Peripheral Vascular Intervention;  Surgeon: Wellington Hampshire, MD;  Location: [place] [facility];  Service: Cardiovascular;  Laterality: Left;  common iliac  . SKIN CANCER EXCISION    . THYROID SURGERY  2007   secondary to thyroid cancer   Social History   Social History  . Marital Status: Married    Spouse Name: N/A  . Number of Children: 1   Occupational History  . Manager    Social History Main Topics  . Smoking status: Former Smoker -- 1.00 packs/day for 20 years    Types: Cigarettes    Quit date: 10/28/2011  . Smokeless tobacco: Never Used  . Alcohol Use: No  . Drug Use: No   Current Outpatient Prescriptions on File Prior to Visit  Medication Sig Dispense Refill  . ALPRAZolam (XANAX) 1 MG tablet Take 1 tablet (1 mg total) by mouth at bedtime as needed for  anxiety. 30 tablet 5  . aspirin 81 MG tablet Take 1 tablet (81 mg total) by mouth daily. 30 tablet   . clopidogrel (PLAVIX) 75 MG tablet Take 1 tablet (75 mg total) by mouth daily. 30 tablet 5  . escitalopram (LEXAPRO) 20 MG tablet Take 1 tablet (20 mg total) by mouth daily. 30 tablet 3  . levothyroxine (SYNTHROID,  LEVOTHROID) 112 MCG tablet Take 1 tablet (112 mcg total) by mouth daily. 90 tablet 3  . Multiple Vitamin (MULTIVITAMIN WITH MINERALS) TABS tablet Take 1 tablet by mouth daily.    . rizatriptan (MAXALT-MLT) 10 MG disintegrating tablet TAKE 1 TABLET BY MOUTH AS NEEDED MAY REPEAT IN 2 HOURS IF NEEDED (Patient taking differently: TAKE 1 TABLET BY MOUTH AS NEEDED MAY REPEAT IN 2 HOURS IF NEEDED FOR MIGRAINES) 10 tablet 0  . rosuvastatin (CRESTOR) 40 MG tablet Take 1 tablet (40 mg total) by mouth daily. 90 tablet 3  . vitamin B-12 (CYANOCOBALAMIN) 1000 MCG tablet Take 1 tablet (1,000 mcg total) by mouth daily. 90 tablet 1   No current facility-administered medications on file prior to visit.    Allergies  Allergen Reactions  . Atorvastatin Other (See Comments)    Myalgias   . Darvocet [Propoxyphene N-Acetaminophen] Nausea And Vomiting  . Demerol Nausea And Vomiting  . Methocarbamol Other (See Comments)    UNKNOWN  . Mirtazapine     Sleeplessness, nausea   . Ambien [Zolpidem Tartrate] Palpitations    Anxiety , trembling.   Family History  Problem Relation Age of Onset  . Alzheimer's disease Father   . Stroke Mother   . Coronary artery disease Maternal Grandmother   . Heart disease Maternal Grandmother 70  . Coronary artery disease Paternal Grandmother 11  . Heart disease Paternal Grandmother 22   PE: BP 138/82 (BP Location: Left Arm, Patient Position: Sitting)   Pulse 79   Wt 125 lb (56.7 kg)   SpO2 96%   BMI 19.01 kg/m  Body mass index is 19.01 kg/m. Wt Readings from Last 3 Encounters:  02/18/17 125 lb (56.7 kg)  01/29/17 127 lb 10.3 oz (57.9 kg)  01/21/17 124 lb  (56.2 kg)   Constitutional: thin, in NAD Eyes: PERRLA, EOMI ENT: moist mucous membranes, no neck masses, no cervical lymphadenopathy Cardiovascular: RRR, No MRG Respiratory: CTA B Gastrointestinal: abdomen soft, NT, ND, BS+ Musculoskeletal: no deformities, strength intact in all 4 Skin: moist, warm, no rashes Neurological: + tremor with outstretched R hand, DTR normal in all 4  ASSESSMENT: 1. Adrenal incidentaloma  2. Postsurgical hypothyroidism  3. 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 Right thyroid: Nodular hyperplasia B. Left thyroid: Nodular hyperplasia with 1.9 cm dominant adenomatoid nodule. Parathyroid tissue in a single section, measuring 0.7 mm. Conclusion: There is nodular hyperplasia (multinodular goiter). There is no evidence of malignancy.   PLAN:  1. Patient with a L 9 mm adrenal nodule  - discovered incidentally.  - Previous hormonal workup and imaging indicate a benign, nonsecreting nodule - To differentiate between a functioning and a nonfunctioning adrenal nodule, we need to rule out hypersecretion by checking the following tests  - dexamethasone suppression test to rule out Cushing syndrome (6% of adrenal incidentalomas). If the cortisol level returns >5, will need 24h urine free cortisol. She had 2 suppressed free cortisol so far. We'll repeat now. - Plasma fractionated metanephrines and catecholamines to rule out pheochromocytoma (3% of adrenal incidentalomas). Normal so far. - Plasma renin activity and aldosterone level to rule out primary hyperaldosteronism (0.6% of adrenal incidentalomas). Normal so far. - we discussed about f/u:   hormonal testing yearly for 5 years - she had 2x sets - will repeat now  CT scans yearly x1-2 - had a chest CT that includes the adrenal >> I discussed with the radiologist  that read the original CT scan (Dr. Ardeen Garland) >> her  adrenal adenoma appears stable compared with the CT scan obtained in 2016. - I will see her back in 1 year  2. Hypothyroidism - TSH normalized after changing the levothyroxine dose to 112 g daily in 09/2015. Last TSH was slightly low, but still WNL. Will repeat today  -  She is taking the thyroid hormone correctly: every day, with water, >30 minutes before breakfast, separated by >4 hours from acid reflux medications, calcium, iron, multivitamins. She is taking it correctly, except she takes MVI 2.5 hours later >> advised her to move them >4h later  Component     Latest Ref Rng & Units 02/18/2017          Epinephrine      <LLN  Norepinephrine     pg/mL 558  Dopamine      <LLN  Catecholamines, Total     pg/mL 558  PRA LC/MS/MS     0.25 - 5.82 ng/mL/h 0.33  ALDO / PRA Ratio     0.9 - 28.9 Ratio 9.1  ALDOSTERONE     ng/dL 3  Metanephrine, Pl     <=57 pg/mL 46  Normetanephrine, Pl     <=148 pg/mL 170 (H)  Total Metanephrines-Plasma     <=205 pg/mL 216 (H)  TSH     0.35 - 4.50 uIU/mL 5.22 (H)  T4,Free(Direct)     0.60 - 1.60 ng/dL 0.88  Potassium     3.5 - 5.1 mEq/L 3.9   Normetanephrine and total metanephrines slightly elevated. This is usually a nonspecific finding. No repeat of the labs as needed for now. We can repeat when she comes back in a year.  TSH is slightly high. I would like to repeat the tests after she moves multivitamins later in the day. We'll have her come back in 1.5 months for repeat  Philemon Kingdom, MD PhD [facility] Endocrinology

## 2017-02-18 NOTE — Patient Instructions (Addendum)
Please stop at the lab.  Please come back for labs at 8 am after you take Dexamethasone at 11 m the night before.  Continue Levothyroxine 112 mcg daily.  Take the thyroid hormone every day, with water, at least 30 minutes before breakfast, separated by at least 4 hours from: - acid reflux medications - calcium - iron - multivitamins Move the multivitamins >4h after b'fast.  Please return in 1 year.

## 2017-02-21 LAB — CATECHOLAMINES, FRACTIONATED, PLASMA
CATECHOLAMINES, TOTAL: 558 pg/mL
Norepinephrine: 558 pg/mL

## 2017-02-22 LAB — ALDOSTERONE + RENIN ACTIVITY W/ RATIO
ALDO / PRA Ratio: 9.1 Ratio (ref 0.9–28.9)
ALDOSTERONE: 3 ng/dL
PRA LC/MS/MS: 0.33 ng/mL/h (ref 0.25–5.82)

## 2017-02-22 LAB — METANEPHRINES, PLASMA
Metanephrine, Free: 46 pg/mL (ref <=57)
Normetanephrine, Free: 170 pg/mL — ABNORMAL HIGH (ref <=148)
TOTAL METANEPHRINES-PLASMA: 216 pg/mL — AB (ref <=205)

## 2017-03-01 ENCOUNTER — Encounter: Payer: Self-pay | Admitting: Internal Medicine

## 2017-03-01 ENCOUNTER — Other Ambulatory Visit: Payer: Self-pay | Admitting: Internal Medicine

## 2017-03-01 MED ORDER — GABAPENTIN 100 MG PO CAPS: 100.0000 mg | ORAL_CAPSULE | Freq: Three times a day (TID) | ORAL | 3 refills | Status: DC

## 2017-03-11 DIAGNOSIS — M7062 Trochanteric bursitis, left hip: Secondary | ICD-10-CM | POA: Insufficient documentation

## 2017-04-01 ENCOUNTER — Inpatient Hospital Stay: Payer: BC Managed Care – PPO

## 2017-04-01 ENCOUNTER — Inpatient Hospital Stay: Payer: BC Managed Care – PPO | Admitting: Oncology

## 2017-04-04 ENCOUNTER — Telehealth: Payer: Self-pay | Admitting: Cardiovascular Disease

## 2017-04-04 NOTE — Telephone Encounter (Signed)
We do check the iliac stent with ultrasound when she comes for ABI. That is standard.

## 2017-04-04 NOTE — Telephone Encounter (Signed)
Pt reports continued pain in the left groin area. She had a stent in the left common iliac artery in December 2017. She has mentioned this to Dr. Fletcher Anon at follow up [place] and she feels the stent could be pressing on her artery causing pain and impaired blood flow. She is scheduled for July LEA and ABI and asks for an US of the area where the stent is. Forward to MD to advise.

## 2017-04-04 NOTE — Telephone Encounter (Signed)
Pt calling stating in December she had a stent placement  Where they did that they used a "minx" to set the incision  She states she is coming for ultra sounds in July if we can order a scan for she states she feel like it is still there sitting on her nerve Please advise.

## 2017-04-05 ENCOUNTER — Other Ambulatory Visit: Payer: Self-pay | Admitting: Internal Medicine

## 2017-04-14 NOTE — Progress Notes (Signed)
[place]  [phone] [phone]  ID: Evelena Leyden OB: [birth year]  MR#: [id number]  FEO#:[id number]  Patient Care Team: [name], MD as PCP - General (Internal Medicine) [name], MD (Internal Medicine)  CHIEF COMPLAINT: Hereditary hemochromatosis, compound heterozygote for C282Y and H63D mutations.  INTERVAL HISTORY: Patient returns to clinic today for repeat laboratory work, further evaluation, and consideration of additional phlebotomy. She currently feels well and is asymptomatic. She has some mild residual congestion after her recent cold.  She has no neurologic complaints. She denies any fevers. She has a good appetite and denies weight loss. She has no chest pain or shortness of breath. She denies any nausea, vomiting, constipation, or diarrhea. She has no urinary complaints. Patient feels at her baseline and offers no specific complaints today.  REVIEW OF SYSTEMS:   Review of Systems  Constitutional: Negative.  Negative for fever, malaise/fatigue and weight loss.  HENT: Positive for congestion.   Respiratory: Negative.  Negative for cough and shortness of breath.   Cardiovascular: Negative.  Negative for chest pain and leg swelling.  Gastrointestinal: Negative.  Negative for abdominal pain.  Genitourinary: Negative.   Musculoskeletal: Negative.   Neurological: Negative.  Negative for weakness.  Psychiatric/Behavioral: Negative.  The patient is not nervous/anxious.     As per HPI. Otherwise, a complete review of systems is negative.  PAST MEDICAL HISTORY: Past Medical History:  Diagnosis Date  . Carotid artery occlusion   . Hyperlipidemia   . Hypocalcemia   . hypothyroidism 2007   acquired, secondary to surgery for thyroid Ca  . Migraine    improved since thyroid surgery, 2 years  . multinodular goiter 2007   s/p thyroidectomy  . Peripheral arterial disease (HCC)    Left common iliac artery occlusion status  post balloon angioplasty without stent placement in January of 2014. Repeat angiography in November of 2014 showed 50% distal aortic stenosis extending into the left common iliac artery which had 60-70% ostial stenosis. No other significant disease. I placed on balloon expandable stent into  left common iliac artery extending slightly into the distal aorta    PAST SURGICAL HISTORY: Past Surgical History:  Procedure Laterality Date  . ABDOMINAL AORTAGRAM N/A 10/14/2013   Procedure: ABDOMINAL [name];  Surgeon: Wellington Hampshire, MD;  Location: [facility];  Service: Cardiovascular;  Laterality: N/A;  . ANGIOPLASTY ILLIAC ARTERY    . CESAREAN SECTION    . NOSE SURGERY    . PERIPHERAL VASCULAR CATHETERIZATION N/A 11/14/2016   Procedure: Abdominal Aortogram;  Surgeon: Wellington Hampshire, MD;  Location: [facility];  Service: Cardiovascular;  Laterality: N/A;  . PERIPHERAL VASCULAR CATHETERIZATION Bilateral 11/14/2016   Procedure: Lower Extremity Angiography;  Surgeon: Wellington Hampshire, MD;  Location: [place] [facility];  Service: Cardiovascular;  Laterality: Bilateral;  limited iliacs only  . PERIPHERAL VASCULAR CATHETERIZATION Left 11/14/2016   Procedure: Peripheral Vascular Intervention;  Surgeon: Wellington Hampshire, MD;  Location: [place] [facility];  Service: Cardiovascular;  Laterality: Left;  common iliac  . SKIN CANCER EXCISION    . THYROID SURGERY  2007   secondary to thyroid cancer    FAMILY HISTORY: Family History  Problem Relation Age of Onset  . Alzheimer's disease Father   . Stroke Mother   . Coronary artery disease Maternal Grandmother   . Heart disease Maternal Grandmother 70  . Coronary artery disease Paternal Grandmother 55  . Heart disease Paternal Grandmother 33  ADVANCED DIRECTIVES (Y/N):  N  HEALTH MAINTENANCE: Social History  Substance Use Topics  . Smoking status: Former Smoker    Packs/day: 1.00    Years: 20.00    Types: Cigarettes    Quit date:  10/28/2011  . Smokeless tobacco: Never Used  . Alcohol use No     Colonoscopy:  PAP:  Bone density:  Lipid panel:  Allergies  Allergen Reactions  . Atorvastatin Other (See Comments)    Myalgias   . Darvocet [Propoxyphene N-Acetaminophen] Nausea And Vomiting  . Demerol Nausea And Vomiting  . Methocarbamol Other (See Comments)    UNKNOWN  . Mirtazapine     Sleeplessness, nausea   . Ambien [Zolpidem Tartrate] Palpitations    Anxiety , trembling.    Current Outpatient Prescriptions  Medication Sig Dispense Refill  . ALPRAZolam (XANAX) 1 MG tablet Take 1 tablet (1 mg total) by mouth at bedtime as needed for anxiety. 30 tablet 5  . aspirin 81 MG tablet Take 1 tablet (81 mg total) by mouth daily. 30 tablet   . clopidogrel (PLAVIX) 75 MG tablet Take 1 tablet (75 mg total) by mouth daily. 30 tablet 5  . dexamethasone (DECADRON) 1 MG tablet Take 1 tablet by mouth once at 11 pm, before coming for labs at 8 am the next morning 1 tablet 0  . gabapentin (NEURONTIN) 100 MG capsule Take 1 capsule (100 mg total) by mouth 3 (three) times daily. Increase gradually to 6 capsules daily 180 capsule 3  . levothyroxine (SYNTHROID, LEVOTHROID) 112 MCG tablet TAKE ONE (1) TABLET BY MOUTH EVERY DAY 90 tablet 1  . Multiple Vitamin (MULTIVITAMIN WITH MINERALS) TABS tablet Take 1 tablet by mouth daily.    . rizatriptan (MAXALT-MLT) 10 MG disintegrating tablet TAKE 1 TABLET BY MOUTH AS NEEDED MAY REPEAT IN 2 HOURS IF NEEDED (Patient taking differently: TAKE 1 TABLET BY MOUTH AS NEEDED MAY REPEAT IN 2 HOURS IF NEEDED FOR MIGRAINES) 10 tablet 0  . rosuvastatin (CRESTOR) 40 MG tablet Take 1 tablet (40 mg total) by mouth daily. 90 tablet 3  . vitamin B-12 (CYANOCOBALAMIN) 1000 MCG tablet Take 1 tablet (1,000 mcg total) by mouth daily. 90 tablet 1  . escitalopram (LEXAPRO) 20 MG tablet Take 1 tablet (20 mg total) by mouth daily. (Patient not taking: Reported on 04/15/2017) 30 tablet 3   No current  facility-administered medications for this visit.     OBJECTIVE: Vitals:   04/15/17 1426  BP: (!) 148/75  Pulse: 99  Resp: 20  Temp: 99.9 F (37.7 C)     Body mass index is 19.22 kg/m.    ECOG FS:0 - Asymptomatic  General: Well-developed, well-nourished, no acute distress. Eyes: Pink conjunctiva, anicteric sclera. Lungs: Clear to auscultation bilaterally. Heart: Regular rate and rhythm. No rubs, murmurs, or gallops. Abdomen: Soft, nontender, nondistended. No organomegaly noted, normoactive bowel sounds. Musculoskeletal: No edema, cyanosis, or clubbing. Neuro: Alert, answering all questions appropriately. Cranial nerves grossly intact. Skin: No rashes or petechiae noted. Psych: Normal affect.   LAB RESULTS:  Lab Results  Component Value Date   NA 141 12/31/2016   K 3.9 02/18/2017   CL 111 12/31/2016   CO2 26 12/31/2016   GLUCOSE 91 12/31/2016   BUN 13 12/31/2016   CREATININE 0.74 12/31/2016   CALCIUM 9.6 12/31/2016   PROT 7.1 12/31/2016   ALBUMIN 4.3 12/31/2016   AST 19 12/31/2016   ALT 16 12/31/2016   ALKPHOS 112 12/31/2016   BILITOT 0.5 12/31/2016   GFRNONAA  94 11/05/2016   GFRAA 109 11/05/2016    Lab Results  Component Value Date   WBC 11.6 (H) 04/15/2017   NEUTROABS 7.1 (H) 04/15/2017   HGB 15.9 04/15/2017   HCT 45.9 04/15/2017   MCV 102.3 (H) 04/15/2017   PLT 219 04/15/2017   Lab Results  Component Value Date   IRON 56 04/15/2017   TIBC 360 04/15/2017   IRONPCTSAT 16 04/15/2017   Lab Results  Component Value Date   FERRITIN 108 04/15/2017     STUDIES: No results found.  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  PLAN:    1. Hereditary hemochromatosis: Patient's hemoglobin is mildly elevated, but essentially unchanged. Her ferritin has improved, but not at goal of 50-100. Proceed with 400 mL phlebotomy today. Patient will then return to clinic in 2 months with repeat laboratory work,  further evaluation, and consideration of additional phlebotomy. 2. Leukocytosis: Possibly reactive, monitor. 3. Elevated MCV: Possibly related to recent phlebotomies. Monitor.  Patient expressed understanding and was in agreement with this plan. She also understands that She can call clinic at any time with any questions, concerns, or complaints.    Lloyd Huger, MD   04/20/2017 8:45 AM

## 2017-04-15 ENCOUNTER — Inpatient Hospital Stay: Payer: BC Managed Care – PPO

## 2017-04-15 ENCOUNTER — Inpatient Hospital Stay (HOSPITAL_BASED_OUTPATIENT_CLINIC_OR_DEPARTMENT_OTHER): Payer: BC Managed Care – PPO | Admitting: Oncology

## 2017-04-15 ENCOUNTER — Inpatient Hospital Stay: Payer: BC Managed Care – PPO | Attending: Oncology

## 2017-04-15 DIAGNOSIS — E785 Hyperlipidemia, unspecified: Secondary | ICD-10-CM | POA: Diagnosis not present

## 2017-04-15 DIAGNOSIS — I2510 Atherosclerotic heart disease of native coronary artery without angina pectoris: Secondary | ICD-10-CM | POA: Insufficient documentation

## 2017-04-15 DIAGNOSIS — E039 Hypothyroidism, unspecified: Secondary | ICD-10-CM

## 2017-04-15 DIAGNOSIS — I739 Peripheral vascular disease, unspecified: Secondary | ICD-10-CM

## 2017-04-15 DIAGNOSIS — Z79899 Other long term (current) drug therapy: Secondary | ICD-10-CM

## 2017-04-15 DIAGNOSIS — Z7982 Long term (current) use of aspirin: Secondary | ICD-10-CM | POA: Insufficient documentation

## 2017-04-15 DIAGNOSIS — D72829 Elevated white blood cell count, unspecified: Secondary | ICD-10-CM | POA: Diagnosis not present

## 2017-04-15 DIAGNOSIS — Z87891 Personal history of nicotine dependence: Secondary | ICD-10-CM | POA: Insufficient documentation

## 2017-04-15 DIAGNOSIS — R7989 Other specified abnormal findings of blood chemistry: Secondary | ICD-10-CM | POA: Diagnosis not present

## 2017-04-15 LAB — CBC WITH DIFFERENTIAL/PLATELET
BASOS ABS: 0.1 10*3/uL (ref 0–0.1)
BASOS PCT: 1 %
Eosinophils Absolute: 0.2 10*3/uL (ref 0–0.7)
Eosinophils Relative: 2 %
HCT: 45.9 % (ref 35.0–47.0)
Hemoglobin: 15.9 g/dL (ref 12.0–16.0)
LYMPHS PCT: 29 %
Lymphs Abs: 3.3 10*3/uL (ref 1.0–3.6)
MCH: 35.4 pg — AB (ref 26.0–34.0)
MCHC: 34.6 g/dL (ref 32.0–36.0)
MCV: 102.3 fL — AB (ref 80.0–100.0)
MONO ABS: 0.9 10*3/uL (ref 0.2–0.9)
Monocytes Relative: 7 %
Neutro Abs: 7.1 10*3/uL — ABNORMAL HIGH (ref 1.4–6.5)
Neutrophils Relative %: 61 %
PLATELETS: 219 10*3/uL (ref 150–440)
RBC: 4.49 MIL/uL (ref 3.80–5.20)
RDW: 13.1 % (ref 11.5–14.5)
WBC: 11.6 10*3/uL — ABNORMAL HIGH (ref 3.6–11.0)

## 2017-04-15 LAB — IRON AND TIBC
IRON: 56 ug/dL (ref 28–170)
Saturation Ratios: 16 % (ref 10.4–31.8)
TIBC: 360 ug/dL (ref 250–450)
UIBC: 304 ug/dL

## 2017-04-15 LAB — FERRITIN: FERRITIN: 108 ng/mL (ref 11–307)

## 2017-04-15 NOTE — Progress Notes (Signed)
Patient reports having a cold, denies any other concerns today.

## 2017-05-09 ENCOUNTER — Ambulatory Visit: Payer: BC Managed Care – PPO | Admitting: Cardiovascular Disease

## 2017-05-27 ENCOUNTER — Ambulatory Visit: Payer: BC Managed Care – PPO

## 2017-05-27 DIAGNOSIS — I739 Peripheral vascular disease, unspecified: Secondary | ICD-10-CM

## 2017-06-03 ENCOUNTER — Ambulatory Visit (INDEPENDENT_AMBULATORY_CARE_PROVIDER_SITE_OTHER): Payer: BC Managed Care – PPO | Admitting: Cardiovascular Disease

## 2017-06-03 ENCOUNTER — Telehealth: Payer: Self-pay | Admitting: Internal Medicine

## 2017-06-03 ENCOUNTER — Encounter: Payer: Self-pay | Admitting: Cardiovascular Disease

## 2017-06-03 VITALS — BP 130/70 | HR 99 | Ht 67.0 in | Wt 120.2 lb

## 2017-06-03 DIAGNOSIS — E785 Hyperlipidemia, unspecified: Secondary | ICD-10-CM

## 2017-06-03 DIAGNOSIS — Z720 Tobacco use: Secondary | ICD-10-CM | POA: Diagnosis not present

## 2017-06-03 DIAGNOSIS — I739 Peripheral vascular disease, unspecified: Secondary | ICD-10-CM

## 2017-06-03 MED ORDER — VARENICLINE TARTRATE 1 MG PO TABS: 1.0000 mg | ORAL_TABLET | Freq: Two times a day (BID) | ORAL | 1 refills | Status: DC

## 2017-06-03 MED ORDER — VARENICLINE TARTRATE 0.5 MG X 11 & 1 MG X 42 PO MISC: ORAL | 0 refills | Status: DC

## 2017-06-03 NOTE — Telephone Encounter (Signed)
Disregard last message.  

## 2017-06-03 NOTE — Patient Instructions (Addendum)
Medication Instructions: Your physician has recommended you make the following change in your medication:  START taking Chantix as prescribed.    Labwork: None.   Procedures/Testing: None.   Follow-Up: 6 months with Dr. Fletcher Anon.   Any Additional Special Instructions Will Be Listed Below (If Applicable).     If you need a refill on your cardiac medications before your next appointment, please call your pharmacy.

## 2017-06-03 NOTE — Progress Notes (Signed)
Cardiology Office Note   Date:  06/03/2017   ID:  Tracie Morris, DOB [birth year], MRN [id number]  PCP:  [name], MD  Cardiologist:   [name], MD   Chief Complaint  Patient presents with  . OTHER    4 month f/u. Patient c/o swelling in left ankle. Patient states she is smoking again. Meds reviewed verbally with pt.      History of Present Illness: Tracie Morris is a 61 y.o. female who presents for a followup visit regarding peripheral arterial disease.  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 She was diagnosed with hereditary hemochromatosis site doses which might have been the trigger for very late stent thrombosis.  Other Medical history include previous tobacco use and hyperlipidemia   Previous CT scan of the chest did show calcifications in the coronary arteries. Recent noninvasive vascular evaluation earlier this month showed normal ABI bilaterally with patent left common iliac stent. Velocities were mildly elevated in the 200 range.  She had recent left hip bursitis which improved with steroid injection. She has been undergoing phlebotomy every other month for hemochromatosis. Unfortunately, she resumed smoking due to stress. No left leg claudication. She continues to have cold feet and toes with bluish discoloration in both sides slightly worse on the left side.  Past Medical History:  Diagnosis Date  . Carotid artery occlusion   . Hyperlipidemia   . Hypocalcemia   . hypothyroidism 2007   acquired, secondary to surgery for thyroid Ca  . Migraine    improved since thyroid surgery, 2 years  . multinodular goiter 2007   s/p thyroidectomy  . 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  Repeat angiography in November of 2014 showed 50% distal aortic stenosis extending into the left common iliac artery which had 60-70% ostial stenosis. No other significant disease. I placed on balloon expandable stent into  left common iliac artery extending slightly into the distal aorta    Past Surgical History:  Procedure Laterality Date  . ABDOMINAL AORTAGRAM N/A 10/14/2013   Procedure: ABDOMINAL [name];  Surgeon: Wellington Hampshire, MD;  Location: [facility]ATH [facility];  Service: Cardiovascular;  Laterality: N/A;  . ANGIOPLASTY ILLIAC ARTERY    . CESAREAN SECTION    . NOSE SURGERY    . PERIPHERAL VASCULAR CATHETERIZATION N/A 11/14/2016   Procedure: Abdominal Aortogram;  Surgeon: Wellington Hampshire, MD;  Location: [place];  Service: Cardiovascular;  Laterality: N/A;  . PERIPHERAL VASCULAR CATHETERIZATION Bilateral 11/14/2016   Procedure: Lower Extremity Angiography;  Surgeon: Wellington Hampshire, MD;  Location: [facility];  Service: Cardiovascular;  Laterality: Bilateral;  limited iliacs only  . PERIPHERAL VASCULAR CATHETERIZATION Left 11/14/2016   Procedure: Peripheral Vascular Intervention;  Surgeon: Wellington Hampshire, MD;  Location: [place] [facility];  Service: Cardiovascular;  Laterality: Left;  common iliac  . SKIN CANCER EXCISION    . THYROID SURGERY  2007   secondary to thyroid cancer     Current Outpatient Prescriptions  Medication Sig Dispense Refill  . ALPRAZolam (XANAX) 1 MG tablet Take 1 tablet (1 mg total) by mouth at bedtime as needed for anxiety. 30 tablet 5  . aspirin 81 MG tablet Take 1 tablet (81 mg total) by  mouth daily. 30 tablet   . clopidogrel (PLAVIX) 75 MG tablet Take 1 tablet (75 mg total) by mouth daily. 30 tablet 5  . dexamethasone (DECADRON) 1 MG tablet Take 1 tablet by mouth once at 11 pm, before coming for labs at 8 am the next morning 1 tablet 0  . gabapentin (NEURONTIN) 100 MG capsule Take 1 capsule (100 mg total) by mouth 3 (three) times daily.  Increase gradually to 6 capsules daily 180 capsule 3  . levothyroxine (SYNTHROID, LEVOTHROID) 112 MCG tablet TAKE ONE (1) TABLET BY MOUTH EVERY DAY 90 tablet 1  . Multiple Vitamin (MULTIVITAMIN WITH MINERALS) TABS tablet Take 1 tablet by mouth daily.    . rosuvastatin (CRESTOR) 40 MG tablet Take 1 tablet (40 mg total) by mouth daily. 90 tablet 3  . vitamin B-12 (CYANOCOBALAMIN) 1000 MCG tablet Take 1 tablet (1,000 mcg total) by mouth daily. 90 tablet 1   No current facility-administered medications for this visit.     Allergies:   Atorvastatin; Darvocet [propoxyphene n-acetaminophen]; Demerol; Methocarbamol; Mirtazapine; and Ambien [zolpidem tartrate]    Social History:  The patient  reports that she has been smoking Cigarettes.  She has a 10.00 pack-year smoking history. She has never used smokeless tobacco. She reports that she does not drink alcohol or use drugs.   Family History:  The patient's family history includes Alzheimer's disease in her father; Coronary artery disease in her maternal grandmother; Coronary artery disease (age of onset: 61) in her paternal grandmother; Heart disease (age of onset: 44) in her maternal grandmother; Heart disease (age of onset: 37) in her paternal grandmother; Stroke in her mother.    ROS:  Please see the history of present illness.   Otherwise, review of systems are positive for none.   All other systems are reviewed and negative.    PHYSICAL EXAM: VS:  BP 130/70 (BP Location: Left Arm, Patient Position: Sitting, Cuff Size: Normal)   Pulse 99   Ht 5\' 7"  (1.702 m)   Wt 120 lb 4 oz (54.5 kg)   BMI 18.83 kg/m  , BMI Body mass index is 18.83 kg/m. GEN: Well nourished, well developed, in no acute distress  HEENT: normal  Neck: no JVD, carotid bruits, or masses Cardiac: RRR; no murmurs, rubs, or gallops,no edema  Respiratory:  clear to auscultation bilaterally, normal work of breathing GI: soft, nontender, nondistended, + BS MS: no deformity or  atrophy  Skin: warm and dry, no rash Neuro:  Strength and sensation are intact Psych: euthymic mood, full affect Vascular: Femoral pulse: Normal bilaterally . Posterior tibial is normal bilaterally. Dorsalis pedis is normal on the right side and +1 on the left side. No ulceration  EKG:  EKG is  ordered today. EKG showed normal sinus rhythm with possible biatrial enlargement.   Recent Labs: 12/31/2016: ALT 16; BUN 13; Creatinine, Ser 0.74; Sodium 141 02/18/2017: Potassium 3.9; TSH 5.22 04/15/2017: Hemoglobin 15.9; Platelets 219    Lipid Panel    Component Value Date/Time   CHOL 159 12/31/2016 1144   TRIG 93.0 12/31/2016 1144   HDL 63.20 12/31/2016 1144   CHOLHDL 3 12/31/2016 1144   VLDL 18.6 12/31/2016 1144   LDLCALC 77 12/31/2016 1144      Wt Readings from Last 3 Encounters:  06/03/17 120 lb 4 oz (54.5 kg)  04/15/17 126 lb 6.4 oz (57.3 kg)  02/18/17 125 lb (56.7 kg)        ASSESSMENT AND PLAN:  1.  Peripheral arterial disease:  Status post left common iliac artery covered stent placement due to in situ thrombosis. Continue lifelong dual antiplatelet therapy. The patient has cold feet bilaterally with bluish discoloration of the toes slightly worse on the left side. She has palpable distal pulses and I suspect that she has microvascular disease.  2. Hyperlipidemia:  Continue high-dose rosuvastatin. Most recent LDL was 77.  3. Carotid disease: This was mild on recent carotid Doppler.  4. Coronary calcifications: No anginal symptoms. Continue aggressive treatment of risk factors.  5. Tobacco use: Unfortunately, she resumed smoking recently. I had a prolonged discussion with her about the importance of smoking cessation especially with underlying peripheral arterial disease as well as hemachromatosis. After discussion, I prescribed Chantix.   Disposition:   FU with me in 6 months  Signed,  [name], MD  06/03/2017 2:38 PM    [place]

## 2017-06-03 NOTE — Telephone Encounter (Signed)
Pt called and wanted to know if we could draw labs for Dr. Cruzita Tracie Morris here in office instead of her going to [place]. Pt states that she will need a 8 am lab appt due to specific lab that needs to be done.  Call pt @ [phone] (may leave msg)

## 2017-06-04 ENCOUNTER — Other Ambulatory Visit (INDEPENDENT_AMBULATORY_CARE_PROVIDER_SITE_OTHER): Payer: BC Managed Care – PPO

## 2017-06-04 DIAGNOSIS — E032 Hypothyroidism due to medicaments and other exogenous substances: Secondary | ICD-10-CM | POA: Diagnosis not present

## 2017-06-04 DIAGNOSIS — E279 Disorder of adrenal gland, unspecified: Secondary | ICD-10-CM

## 2017-06-04 DIAGNOSIS — E278 Other specified disorders of adrenal gland: Secondary | ICD-10-CM

## 2017-06-04 LAB — TSH: TSH: 0.22 u[IU]/mL — ABNORMAL LOW (ref 0.35–4.50)

## 2017-06-04 LAB — T4, FREE: Free T4: 1.4 ng/dL (ref 0.60–1.60)

## 2017-06-05 LAB — CORTISOL-AM, BLOOD: Cortisol - AM: 2.9 ug/dL — ABNORMAL LOW

## 2017-06-13 ENCOUNTER — Inpatient Hospital Stay: Payer: BC Managed Care – PPO | Attending: Oncology

## 2017-06-13 DIAGNOSIS — Z7982 Long term (current) use of aspirin: Secondary | ICD-10-CM | POA: Diagnosis not present

## 2017-06-13 DIAGNOSIS — R5381 Other malaise: Secondary | ICD-10-CM | POA: Insufficient documentation

## 2017-06-13 DIAGNOSIS — F17210 Nicotine dependence, cigarettes, uncomplicated: Secondary | ICD-10-CM | POA: Insufficient documentation

## 2017-06-13 DIAGNOSIS — E785 Hyperlipidemia, unspecified: Secondary | ICD-10-CM | POA: Insufficient documentation

## 2017-06-13 DIAGNOSIS — R5383 Other fatigue: Secondary | ICD-10-CM | POA: Insufficient documentation

## 2017-06-13 DIAGNOSIS — D72829 Elevated white blood cell count, unspecified: Secondary | ICD-10-CM | POA: Insufficient documentation

## 2017-06-13 DIAGNOSIS — Z85850 Personal history of malignant neoplasm of thyroid: Secondary | ICD-10-CM | POA: Diagnosis not present

## 2017-06-13 DIAGNOSIS — Z79899 Other long term (current) drug therapy: Secondary | ICD-10-CM | POA: Diagnosis not present

## 2017-06-13 DIAGNOSIS — I739 Peripheral vascular disease, unspecified: Secondary | ICD-10-CM | POA: Insufficient documentation

## 2017-06-13 DIAGNOSIS — R7989 Other specified abnormal findings of blood chemistry: Secondary | ICD-10-CM | POA: Diagnosis not present

## 2017-06-13 DIAGNOSIS — E890 Postprocedural hypothyroidism: Secondary | ICD-10-CM | POA: Diagnosis not present

## 2017-06-13 LAB — IRON AND TIBC
Iron: 186 ug/dL — ABNORMAL HIGH (ref 28–170)
Saturation Ratios: 49 % — ABNORMAL HIGH (ref 10.4–31.8)
TIBC: 382 ug/dL (ref 250–450)
UIBC: 196 ug/dL

## 2017-06-13 LAB — CBC WITH DIFFERENTIAL/PLATELET
Basophils Absolute: 0.1 10*3/uL (ref 0–0.1)
Basophils Relative: 1 %
Eosinophils Absolute: 0.2 10*3/uL (ref 0–0.7)
Eosinophils Relative: 2 %
HEMATOCRIT: 46.7 % (ref 35.0–47.0)
HEMOGLOBIN: 16.7 g/dL — AB (ref 12.0–16.0)
LYMPHS ABS: 3.5 10*3/uL (ref 1.0–3.6)
LYMPHS PCT: 30 %
MCH: 36.7 pg — AB (ref 26.0–34.0)
MCHC: 35.7 g/dL (ref 32.0–36.0)
MCV: 102.9 fL — AB (ref 80.0–100.0)
MONOS PCT: 6 %
Monocytes Absolute: 0.7 10*3/uL (ref 0.2–0.9)
NEUTROS ABS: 6.9 10*3/uL — AB (ref 1.4–6.5)
Neutrophils Relative %: 61 %
Platelets: 225 10*3/uL (ref 150–440)
RBC: 4.54 MIL/uL (ref 3.80–5.20)
RDW: 13.2 % (ref 11.5–14.5)
WBC: 11.4 10*3/uL — AB (ref 3.6–11.0)

## 2017-06-13 LAB — FERRITIN: FERRITIN: 66 ng/mL (ref 11–307)

## 2017-06-14 ENCOUNTER — Inpatient Hospital Stay: Payer: BC Managed Care – PPO

## 2017-06-14 ENCOUNTER — Encounter: Payer: Self-pay | Admitting: Internal Medicine

## 2017-06-14 ENCOUNTER — Other Ambulatory Visit: Payer: Self-pay | Admitting: Internal Medicine

## 2017-06-14 DIAGNOSIS — E032 Hypothyroidism due to medicaments and other exogenous substances: Secondary | ICD-10-CM

## 2017-06-16 NOTE — Progress Notes (Signed)
[place]  [phone] [phone]  ID: Tracie Morris OB: Feb 16, 1956  MR#: [id number]  AJO#:[id number]  Patient Care Team: [name], MD as PCP - General (Internal Medicine) [name], MD (Internal Medicine)  CHIEF COMPLAINT: Hereditary hemochromatosis, compound heterozygote for C282Y and H63D mutations.  INTERVAL HISTORY: Patient returns to clinic today for repeat laboratory work, further evaluation, and consideration of additional phlebotomy. She currently feels well and is asymptomatic. She occasionally feels tired. She has just started on Chantix to try and quit smoking. She has no neurologic complaints. She denies any fevers. She has a good appetite and denies weight loss. She has no chest pain or shortness of breath. She denies any nausea, vomiting, constipation, or diarrhea. She has no urinary complaints. Patient feels at her baseline and offers no specific complaints today.  REVIEW OF SYSTEMS:   Review of Systems  Constitutional: Positive for malaise/fatigue. Negative for fever and weight loss.  HENT: Negative.   Respiratory: Negative.  Negative for cough and shortness of breath.   Cardiovascular: Negative.  Negative for chest pain and leg swelling.  Gastrointestinal: Negative.  Negative for abdominal pain.  Genitourinary: Negative.   Musculoskeletal: Negative.   Neurological: Negative.  Negative for weakness.  Psychiatric/Behavioral: Negative.  The patient is not nervous/anxious.     As per HPI. Otherwise, a complete review of systems is negative.  PAST MEDICAL HISTORY: Past Medical History:  Diagnosis Date  . Carotid artery occlusion   . Hyperlipidemia   . Hypocalcemia   . hypothyroidism 2007   acquired, secondary to surgery for thyroid Ca  . Migraine    improved since thyroid surgery, 2 years  . multinodular goiter 2007   s/p thyroidectomy  . 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 Repeat angiography in November of 2014 showed 50% distal aortic stenosis extending into the left common iliac artery which had 60-70% ostial stenosis. No other significant disease. I placed on balloon expandable stent into  left common iliac artery extending slightly into the distal aorta    PAST SURGICAL HISTORY: Past Surgical History:  Procedure Laterality Date  . ABDOMINAL AORTAGRAM N/A 10/14/2013   Procedure: ABDOMINAL [name];  Surgeon: Wellington Hampshire, MD;  Location: [facility];  Service: Cardiovascular;  Laterality: N/A;  . ANGIOPLASTY ILLIAC ARTERY    . CESAREAN SECTION    . NOSE SURGERY    . PERIPHERAL VASCULAR CATHETERIZATION N/A 11/14/2016   Procedure: Abdominal Aortogram;  Surgeon: Wellington Hampshire, MD;  Location: [facility];  Service: Cardiovascular;  Laterality: N/A;  . PERIPHERAL VASCULAR CATHETERIZATION Bilateral 11/14/2016   Procedure: Lower Extremity Angiography;  Surgeon: Wellington Hampshire, MD;  Location: [facility];  Service: Cardiovascular;  Laterality: Bilateral;  limited iliacs only  . PERIPHERAL VASCULAR CATHETERIZATION Left 11/14/2016   Procedure: Peripheral Vascular Intervention;  Surgeon: Wellington Hampshire, MD;  Location: [facility];  Service: Cardiovascular;  Laterality: Left;  common iliac  . SKIN CANCER EXCISION    . THYROID SURGERY  2007   secondary to thyroid cancer    FAMILY HISTORY: Family History  Problem Relation Age of Onset  . Alzheimer's disease Father   . Stroke Mother   . Coronary artery disease Maternal Grandmother   . Heart disease Maternal Grandmother 70  . Coronary artery disease Paternal Grandmother 73  . Heart disease Paternal Grandmother 28  ADVANCED DIRECTIVES (Y/N):  N  HEALTH MAINTENANCE: Social History  Substance Use Topics  . Smoking status: Current Every Day Smoker    Packs/day: 0.50    Years: 20.00    Types:  Cigarettes    Last attempt to quit: 10/28/2011  . Smokeless tobacco: Never Used  . Alcohol use No     Colonoscopy:  PAP:  Bone density:  Lipid panel:  Allergies  Allergen Reactions  . Atorvastatin Other (See Comments)    Myalgias   . Darvocet [Propoxyphene N-Acetaminophen] Nausea And Vomiting  . Demerol Nausea And Vomiting  . Methocarbamol Other (See Comments)    UNKNOWN  . Mirtazapine     Sleeplessness, nausea   . Ambien [Zolpidem Tartrate] Palpitations    Anxiety , trembling.    Current Outpatient Prescriptions  Medication Sig Dispense Refill  . ALPRAZolam (XANAX) 1 MG tablet Take 1 tablet (1 mg total) by mouth at bedtime as needed for anxiety. 30 tablet 5  . aspirin 81 MG tablet Take 1 tablet (81 mg total) by mouth daily. 30 tablet   . clopidogrel (PLAVIX) 75 MG tablet Take 1 tablet (75 mg total) by mouth daily. 30 tablet 5  . gabapentin (NEURONTIN) 100 MG capsule Take 1 capsule (100 mg total) by mouth 3 (three) times daily. Increase gradually to 6 capsules daily 180 capsule 3  . levothyroxine (SYNTHROID, LEVOTHROID) 112 MCG tablet TAKE ONE (1) TABLET BY MOUTH EVERY DAY 90 tablet 1  . Multiple Vitamin (MULTIVITAMIN WITH MINERALS) TABS tablet Take 1 tablet by mouth daily.    . rosuvastatin (CRESTOR) 40 MG tablet Take 1 tablet (40 mg total) by mouth daily. 90 tablet 3  . varenicline (CHANTIX CONTINUING MONTH PAK) 1 MG tablet Take 1 tablet (1 mg total) by mouth 2 (two) times daily. 60 tablet 1  . varenicline (CHANTIX STARTING MONTH PAK) 0.5 MG X 11 & 1 MG X 42 tablet Take one 0.5 mg tablet by mouth once daily for 3 days, then increase to one 0.5 mg tablet twice daily for 4 days, then increase to one 1 mg tablet twice daily. 53 tablet 0  . vitamin B-12 (CYANOCOBALAMIN) 1000 MCG tablet Take 1 tablet (1,000 mcg total) by mouth daily. 90 tablet 1   No current facility-administered medications for this visit.     OBJECTIVE: Vitals:   06/17/17 1402  BP: 126/84  Pulse: 83    Resp: 18  Temp: (!) 96.3 F (35.7 C)     Body mass index is 18.62 kg/m.    ECOG FS:0 - Asymptomatic  General: Well-developed, well-nourished, no acute distress. Eyes: Pink conjunctiva, anicteric sclera. Lungs: Clear to auscultation bilaterally. Heart: Regular rate and rhythm. No rubs, murmurs, or gallops. Abdomen: Soft, nontender, nondistended. No organomegaly noted, normoactive bowel sounds. Musculoskeletal: No edema, cyanosis, or clubbing. Neuro: Alert, answering all questions appropriately. Cranial nerves grossly intact. Skin: No rashes or petechiae noted. Psych: Normal affect.   LAB RESULTS:  Lab Results  Component Value Date   NA 141 12/31/2016   K 3.9 02/18/2017   CL 111 12/31/2016   CO2 26 12/31/2016   GLUCOSE 91 12/31/2016   BUN 13 12/31/2016   CREATININE 0.74 12/31/2016   CALCIUM 9.6 12/31/2016   PROT 7.1 12/31/2016   ALBUMIN 4.3 12/31/2016   AST 19 12/31/2016   ALT 16 12/31/2016   ALKPHOS 112 12/31/2016   BILITOT 0.5 12/31/2016   GFRNONAA 94 11/05/2016   GFRAA 109 11/05/2016    Lab Results  Component Value  Date   WBC 11.4 (H) 06/13/2017   NEUTROABS 6.9 (H) 06/13/2017   HGB 16.7 (H) 06/13/2017   HCT 46.7 06/13/2017   MCV 102.9 (H) 06/13/2017   PLT 225 06/13/2017   Lab Results  Component Value Date   IRON 186 (H) 06/13/2017   TIBC 382 06/13/2017   IRONPCTSAT 49 (H) 06/13/2017   Lab Results  Component Value Date   FERRITIN 66 06/13/2017     STUDIES: No results found.  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  PLAN:    1. Hereditary hemochromatosis: Patient's hemoglobin is mildly elevated, but essentially unchanged. Her ferritin has improved and is within goal range. Ferritin is 66 today. She does not need phlebotomy today. Patient will then return to clinic in 1 month for labs only and in 3 months with labs/MD and possible phlebotomy.   2. Leukocytosis: Possibly reactive, monitor. 3.  Elevated MCV: Possibly related to recent phlebotomies and smoking. She has started on Chantix. Continue to monitor.   Patient expressed understanding and was in agreement with this plan. She also understands that She can call clinic at any time with any questions, concerns, or complaints.    Jacquelin Hawking, NP   06/18/2017 3:43 PM

## 2017-06-17 ENCOUNTER — Inpatient Hospital Stay (HOSPITAL_BASED_OUTPATIENT_CLINIC_OR_DEPARTMENT_OTHER): Payer: BC Managed Care – PPO | Admitting: Oncology

## 2017-06-17 ENCOUNTER — Other Ambulatory Visit: Payer: BC Managed Care – PPO

## 2017-06-17 ENCOUNTER — Inpatient Hospital Stay: Payer: BC Managed Care – PPO

## 2017-06-17 DIAGNOSIS — D72829 Elevated white blood cell count, unspecified: Secondary | ICD-10-CM

## 2017-06-17 DIAGNOSIS — F17210 Nicotine dependence, cigarettes, uncomplicated: Secondary | ICD-10-CM

## 2017-06-17 DIAGNOSIS — E890 Postprocedural hypothyroidism: Secondary | ICD-10-CM

## 2017-06-17 DIAGNOSIS — Z7982 Long term (current) use of aspirin: Secondary | ICD-10-CM | POA: Diagnosis not present

## 2017-06-17 DIAGNOSIS — I739 Peripheral vascular disease, unspecified: Secondary | ICD-10-CM | POA: Diagnosis not present

## 2017-06-17 DIAGNOSIS — Z85850 Personal history of malignant neoplasm of thyroid: Secondary | ICD-10-CM

## 2017-06-17 DIAGNOSIS — R5381 Other malaise: Secondary | ICD-10-CM | POA: Diagnosis not present

## 2017-06-17 DIAGNOSIS — E785 Hyperlipidemia, unspecified: Secondary | ICD-10-CM | POA: Diagnosis not present

## 2017-06-17 DIAGNOSIS — R5383 Other fatigue: Secondary | ICD-10-CM

## 2017-06-17 DIAGNOSIS — Z79899 Other long term (current) drug therapy: Secondary | ICD-10-CM

## 2017-06-17 DIAGNOSIS — R7989 Other specified abnormal findings of blood chemistry: Secondary | ICD-10-CM | POA: Diagnosis not present

## 2017-06-17 NOTE — Progress Notes (Signed)
Patient denies any concerns today.  

## 2017-07-13 ENCOUNTER — Other Ambulatory Visit: Payer: Self-pay | Admitting: Internal Medicine

## 2017-07-15 ENCOUNTER — Inpatient Hospital Stay: Payer: BC Managed Care – PPO | Attending: Oncology

## 2017-07-15 LAB — CBC WITH DIFFERENTIAL/PLATELET
BASOS PCT: 1 %
Basophils Absolute: 0.1 10*3/uL (ref 0–0.1)
EOS ABS: 0.1 10*3/uL (ref 0–0.7)
EOS PCT: 1 %
HCT: 45.9 % (ref 35.0–47.0)
Hemoglobin: 16.1 g/dL — ABNORMAL HIGH (ref 12.0–16.0)
LYMPHS ABS: 3.9 10*3/uL — AB (ref 1.0–3.6)
Lymphocytes Relative: 29 %
MCH: 36.1 pg — ABNORMAL HIGH (ref 26.0–34.0)
MCHC: 35 g/dL (ref 32.0–36.0)
MCV: 103.2 fL — ABNORMAL HIGH (ref 80.0–100.0)
MONO ABS: 0.9 10*3/uL (ref 0.2–0.9)
MONOS PCT: 6 %
Neutro Abs: 8.5 10*3/uL — ABNORMAL HIGH (ref 1.4–6.5)
Neutrophils Relative %: 63 %
Platelets: 231 10*3/uL (ref 150–440)
RBC: 4.45 MIL/uL (ref 3.80–5.20)
RDW: 13.7 % (ref 11.5–14.5)
WBC: 13.4 10*3/uL — ABNORMAL HIGH (ref 3.6–11.0)

## 2017-07-15 LAB — FERRITIN: FERRITIN: 39 ng/mL (ref 11–307)

## 2017-07-15 LAB — IRON AND TIBC
Iron: 140 ug/dL (ref 28–170)
SATURATION RATIOS: 35 % — AB (ref 10.4–31.8)
TIBC: 395 ug/dL (ref 250–450)
UIBC: 255 ug/dL

## 2017-07-15 NOTE — Telephone Encounter (Signed)
Refill for 30 days only.  OFFICE VISIT NEEDED an labs  prior to any more refills

## 2017-07-15 NOTE — Telephone Encounter (Signed)
Printed, signed and faxed.  

## 2017-07-15 NOTE — Telephone Encounter (Signed)
Last OV 12/31/2016 Next OV Not scheduled Last refill 12/31/2016

## 2017-07-22 ENCOUNTER — Encounter: Payer: Self-pay | Admitting: Internal Medicine

## 2017-07-22 ENCOUNTER — Ambulatory Visit (INDEPENDENT_AMBULATORY_CARE_PROVIDER_SITE_OTHER): Payer: BC Managed Care – PPO | Admitting: Internal Medicine

## 2017-07-22 VITALS — BP 116/64 | HR 73 | Temp 98.7°F | Resp 15 | Ht 67.0 in | Wt 121.0 lb

## 2017-07-22 DIAGNOSIS — I739 Peripheral vascular disease, unspecified: Secondary | ICD-10-CM

## 2017-07-22 DIAGNOSIS — E7800 Pure hypercholesterolemia, unspecified: Secondary | ICD-10-CM

## 2017-07-22 DIAGNOSIS — R251 Tremor, unspecified: Secondary | ICD-10-CM | POA: Diagnosis not present

## 2017-07-22 DIAGNOSIS — E032 Hypothyroidism due to medicaments and other exogenous substances: Secondary | ICD-10-CM

## 2017-07-22 DIAGNOSIS — Z79899 Other long term (current) drug therapy: Secondary | ICD-10-CM | POA: Diagnosis not present

## 2017-07-22 DIAGNOSIS — I798 Other disorders of arteries, arterioles and capillaries in diseases classified elsewhere: Secondary | ICD-10-CM

## 2017-07-22 MED ORDER — METOPROLOL SUCCINATE ER 25 MG PO TB24: 12.5000 mg | ORAL_TABLET | Freq: Every day | ORAL | 3 refills | Status: DC

## 2017-07-22 MED ORDER — ALPRAZOLAM 1 MG PO TABS: ORAL_TABLET | ORAL | 5 refills | Status: DC

## 2017-07-22 NOTE — Patient Instructions (Signed)
   IF YOU CAN TOLERATE IT,  I would like you to add 12.5 mg metoprolol daily in the evening.  This medication is beneficial for patients  with heart disease AND for tremors

## 2017-07-22 NOTE — Progress Notes (Signed)
Subjective:  Patient ID: Tracie Morris, female    DOB: 01-Jul-1956  Age: 61 y.o. MRN: [id number]  CC: The primary encounter diagnosis was Pure hypercholesterolemia. Diagnoses of Iatrogenic hypothyroidism, Long-term use of high-risk medication, Peripheral vascular disease, secondary ([place]), Hereditary hemochromatosis ([place]), and Tremor of right hand were also pertinent to this visit.  HPI [name] presents for FOLLOW UP ON CHRONIC ISSUES.  PAD:  She is exercising regularly.  NO PULSE IN left great toe,  No pain. Arida not worried about it for now.  Going to the gym for walks on treadmill,  Exercise classes   Can walk 2.5 miles on treadmill  Left hip bursitis :  Received and  I/A injection  in left hip by Earnestine Leys for bursitis   Diagnosed with hereditary hemochromatosis, confirmed with both genes in Feb 2018, and has received phlebotomy several times until iron levels dropped    Had left inguinal pain from compression of inguinal nerve by the [facility] (closure device put in after angiogram) now resolved.   Hypothyroidism:  Thyroid dose unchanged.  Dr [name] did NOT  Lower thyroid dose in July bc the TSH suppression was caused by the dexamethasone .  The previously elevated  TSH in March was attributed to malasorption from taking within 4 hours of protein meal, so she has changed her administration and will repeat her TSH today .  Adrenal tumor :  Incidental finding in 2016 , ongoing surveillance with by Dr. [name] in progress.   Taking chantix bc she resumed smoking in Jan/Feb.  Weaned her daily use of cigs to 4 cigs daily currently.  Not having cravings,  So plans to quit this week.   Right sided hand tremor making keyboard work difficult,  Going to see a Restaurant manager, fast food in [place]  for workup of tremor without spinal manipulaiton  Dr Andree Elk.      Lab Results  Component Value Date   TSH 0.22 (L) 06/04/2017     Outpatient Medications Prior to Visit  Medication Sig Dispense  Refill  . aspirin 81 MG tablet Take 1 tablet (81 mg total) by mouth daily. 30 tablet   . clopidogrel (PLAVIX) 75 MG tablet Take 1 tablet (75 mg total) by mouth daily. 30 tablet 5  . gabapentin (NEURONTIN) 100 MG capsule Take 1 capsule (100 mg total) by mouth 3 (three) times daily. Increase gradually to 6 capsules daily 180 capsule 3  . levothyroxine (SYNTHROID, LEVOTHROID) 112 MCG tablet TAKE ONE (1) TABLET BY MOUTH EVERY DAY 90 tablet 1  . Multiple Vitamin (MULTIVITAMIN WITH MINERALS) TABS tablet Take 1 tablet by mouth daily.    . rosuvastatin (CRESTOR) 40 MG tablet Take 1 tablet (40 mg total) by mouth daily. 90 tablet 3  . varenicline (CHANTIX CONTINUING MONTH PAK) 1 MG tablet Take 1 tablet (1 mg total) by mouth 2 (two) times daily. 60 tablet 1  . varenicline (CHANTIX STARTING MONTH PAK) 0.5 MG X 11 & 1 MG X 42 tablet Take one 0.5 mg tablet by mouth once daily for 3 days, then increase to one 0.5 mg tablet twice daily for 4 days, then increase to one 1 mg tablet twice daily. 53 tablet 0  . vitamin B-12 (CYANOCOBALAMIN) 1000 MCG tablet Take 1 tablet (1,000 mcg total) by mouth daily. 90 tablet 1  . ALPRAZolam (XANAX) 1 MG tablet TAKE ONE TABLET BY MOUTH AT BEDTIME AS NEEDED FOR ANXIETY 30 tablet 0   No facility-administered medications prior to visit.  Review of Systems;  Patient denies headache, fevers, malaise, unintentional weight loss, skin rash, eye pain, sinus congestion and sinus pain, sore throat, dysphagia,  hemoptysis , cough, dyspnea, wheezing, chest pain, palpitations, orthopnea, edema, abdominal pain, nausea, melena, diarrhea, constipation, flank pain, dysuria, hematuria, urinary  Frequency, nocturia, numbness, tingling, seizures,  Focal weakness, Loss of consciousness,  Tremor, insomnia, depression, anxiety, and suicidal ideation.      Objective:  BP 116/64 (BP Location: Left Arm, Patient Position: Sitting, Cuff Size: Normal)   Pulse 73   Temp 98.7 F (37.1 C) (Oral)   Resp  15   Ht 5\' 7"  (1.702 m)   Wt 121 lb (54.9 kg)   SpO2 96%   BMI 18.95 kg/m   BP Readings from Last 3 Encounters:  07/22/17 116/64  06/17/17 126/84  06/03/17 130/70    Wt Readings from Last 3 Encounters:  07/22/17 121 lb (54.9 kg)  06/17/17 118 lb 14.4 oz (53.9 kg)  06/03/17 120 lb 4 oz (54.5 kg)    General appearance: alert, cooperative and appears stated age Ears: normal TM's and external ear canals both ears Throat: lips, mucosa, and tongue normal; teeth and gums normal Neck: no adenopathy, no carotid bruit, supple, symmetrical, trachea midline and thyroid not enlarged, symmetric, no tenderness/mass/nodules Back: symmetric, no curvature. ROM normal. No CVA tenderness. Lungs: clear to auscultation bilaterally Heart: regular rate and rhythm, S1, S2 normal, no murmur, click, rub or gallop Abdomen: soft, non-tender; bowel sounds normal; no masses,  no organomegaly Pulses: 2+ and symmetric Skin: Skin color, texture, turgor normal. No rashes or lesions Lymph nodes: Cervical, supraclavicular, and axillary nodes normal.  No results found for: HGBA1C  Lab Results  Component Value Date   CREATININE 0.74 12/31/2016   CREATININE 0.69 11/05/2016   CREATININE 0.76 04/09/2016    Lab Results  Component Value Date   WBC 13.4 (H) 07/15/2017   HGB 16.1 (H) 07/15/2017   HCT 45.9 07/15/2017   PLT 231 07/15/2017   GLUCOSE 91 12/31/2016   CHOL 159 12/31/2016   TRIG 93.0 12/31/2016   HDL 63.20 12/31/2016   LDLCALC 77 12/31/2016   ALT 16 12/31/2016   AST 19 12/31/2016   NA 141 12/31/2016   K 3.9 02/18/2017   CL 111 12/31/2016   CREATININE 0.74 12/31/2016   BUN 13 12/31/2016   CO2 26 12/31/2016   TSH 0.22 (L) 06/04/2017   INR 1.0 11/05/2016   MICROALBUR 0.6 01/11/2014    No results found.  Assessment & Plan:   Problem List Items Addressed This Visit    Hereditary hemochromatosis ([place])    ETIOLOGY CONFIRMED WITH GENETIC TESTING.  Hematology managing with monthly  surveillance and therapeutic phlebotomy.    Lab Results  Component Value Date   WBC 13.4 (H) 07/15/2017   HGB 16.1 (H) 07/15/2017   HCT 45.9 07/15/2017   MCV 103.2 (H) 07/15/2017   PLT 231 07/15/2017   Lab Results  Component Value Date   IRON 140 07/15/2017   TIBC 395 07/15/2017   FERRITIN 39 07/15/2017        Hyperlipidemia - Primary   Relevant Medications   metoprolol succinate (TOPROL-XL) 25 MG 24 hr tablet   Other Relevant Orders   Lipid panel   Iatrogenic hypothyroidism     repeat TSH is pending.  TSH suppression in January was caused by dexamethasone suppression test  Lab Results  Component Value Date   TSH 0.22 (L) 06/04/2017  Relevant Medications   metoprolol succinate (TOPROL-XL) 25 MG 24 hr tablet   Other Relevant Orders   TSH   T4, free   Peripheral vascular disease, secondary ([place])    In stent thrombosis of left common iliac artery was addressed with second stent dec 2018 by [place].  Loss of DP pulse due to embolization was noted, but she is exercising without pain.  Contributors include ongoing tobacco abuse and new diagnosis of HH.  Continue asa, plavix, statin,  Therapeutic phlebotomy and smoking cessation needed       Relevant Medications   metoprolol succinate (TOPROL-XL) 25 MG 24 hr tablet   Tremor of right hand    trial of Toprol XL 25 mg at bedtime.        Other Visit Diagnoses    Long-term use of high-risk medication       Relevant Orders   Comprehensive metabolic panel    A total of 25 minutes of face to face time was spent with patient more than half of which was spent in counselling about the above mentioned conditions  and coordination of care   I am having Ms. Johannes start on metoprolol succinate. I am also having her maintain her aspirin, clopidogrel, multivitamin with minerals, rosuvastatin, vitamin B-12, gabapentin, levothyroxine, varenicline, varenicline, and ALPRAZolam.  Meds ordered this encounter  Medications  .  ALPRAZolam (XANAX) 1 MG tablet    Sig: TAKE ONE TABLET BY MOUTH AT BEDTIME AS NEEDED FOR ANXIETY    Dispense:  30 tablet    Refill:  5    R  . metoprolol succinate (TOPROL-XL) 25 MG 24 hr tablet    Sig: Take 0.5 tablets (12.5 mg total) by mouth daily. At bedtime    Dispense:  30 tablet    Refill:  3    Medications Discontinued During This Encounter  Medication Reason  . ALPRAZolam (XANAX) 1 MG tablet Reorder    Follow-up: Return in about 6 months (around 01/22/2018).   [name], MD

## 2017-07-23 ENCOUNTER — Encounter: Payer: Self-pay | Admitting: Internal Medicine

## 2017-07-23 DIAGNOSIS — G252 Other specified forms of tremor: Secondary | ICD-10-CM | POA: Insufficient documentation

## 2017-07-23 LAB — COMPREHENSIVE METABOLIC PANEL
ALK PHOS: 85 U/L (ref 39–117)
ALT: 19 U/L (ref 0–35)
AST: 23 U/L (ref 0–37)
Albumin: 4.2 g/dL (ref 3.5–5.2)
BUN: 8 mg/dL (ref 6–23)
CO2: 28 mEq/L (ref 19–32)
Calcium: 9.6 mg/dL (ref 8.4–10.5)
Chloride: 104 mEq/L (ref 96–112)
Creatinine, Ser: 0.74 mg/dL (ref 0.40–1.20)
GFR: 84.5 mL/min (ref >60.00)
GLUCOSE: 77 mg/dL (ref 70–99)
POTASSIUM: 3.7 meq/L (ref 3.5–5.1)
SODIUM: 139 meq/L (ref 135–145)
TOTAL PROTEIN: 7.1 g/dL (ref 6.0–8.3)
Total Bilirubin: 0.4 mg/dL (ref 0.2–1.2)

## 2017-07-23 LAB — LIPID PANEL
Cholesterol: 169 mg/dL (ref 0–200)
HDL: 67.6 mg/dL (ref >39.00)
LDL Cholesterol: 81 mg/dL (ref 0–99)
NONHDL: 101.59
Total CHOL/HDL Ratio: 3
Triglycerides: 103 mg/dL (ref 0.0–149.0)
VLDL: 20.6 mg/dL (ref 0.0–40.0)

## 2017-07-23 LAB — TSH: TSH: 1.28 u[IU]/mL (ref 0.35–4.50)

## 2017-07-23 LAB — T4, FREE: FREE T4: 1.06 ng/dL (ref 0.60–1.60)

## 2017-07-23 NOTE — Assessment & Plan Note (Signed)
ETIOLOGY CONFIRMED WITH GENETIC TESTING.  Hematology managing with monthly surveillance and therapeutic phlebotomy.    Lab Results  Component Value Date   WBC 13.4 (H) 07/15/2017   HGB 16.1 (H) 07/15/2017   HCT 45.9 07/15/2017   MCV 103.2 (H) 07/15/2017   PLT 231 07/15/2017   Lab Results  Component Value Date   IRON 140 07/15/2017   TIBC 395 07/15/2017   FERRITIN 39 07/15/2017

## 2017-07-23 NOTE — Assessment & Plan Note (Signed)
  repeat TSH is pending.  TSH suppression in January was caused by dexamethasone suppression test  Lab Results  Component Value Date   TSH 0.22 (L) 06/04/2017

## 2017-07-23 NOTE — Assessment & Plan Note (Signed)
trial of Toprol XL 25 mg at bedtime.

## 2017-07-23 NOTE — Assessment & Plan Note (Signed)
In stent thrombosis of left common iliac artery was addressed with second stent dec 2018 by [place].  Loss of DP pulse due to embolization was noted, but she is exercising without pain.  Contributors include ongoing tobacco abuse and new diagnosis of HH.  Continue asa, plavix, statin,  Therapeutic phlebotomy and smoking cessation needed

## 2017-07-24 ENCOUNTER — Encounter: Payer: Self-pay | Admitting: Internal Medicine

## 2017-08-07 ENCOUNTER — Other Ambulatory Visit: Payer: Self-pay | Admitting: Internal Medicine

## 2017-08-15 ENCOUNTER — Other Ambulatory Visit: Payer: Self-pay | Admitting: Internal Medicine

## 2017-08-19 ENCOUNTER — Other Ambulatory Visit: Payer: Self-pay | Admitting: Internal Medicine

## 2017-08-19 NOTE — Telephone Encounter (Signed)
Pt called to follow up on the refill. Thank you!

## 2017-09-09 ENCOUNTER — Telehealth: Payer: Self-pay | Admitting: *Deleted

## 2017-09-09 NOTE — Telephone Encounter (Signed)
Called pt to verify which lab insurance will cover, left vm message for patient.

## 2017-09-09 NOTE — Telephone Encounter (Signed)
-----   Message from [facility] sent at 09/06/2017  9:25 AM EDT ----- Good morning - patient called and stated that her insurance will not pay for her labs/phelbotomy because we bill as out-patient facility. She would like for you to call her so she can be schedule to go to another lab which they would pay for.  I verified her numbers in the system. They are all correct.  Thanks,  [employer]

## 2017-09-13 ENCOUNTER — Other Ambulatory Visit: Payer: Self-pay | Admitting: *Deleted

## 2017-09-14 NOTE — Progress Notes (Deleted)
[place]  Telephone:(336) [phone] Fax:(336) [phone]  ID: Tracie Morris OB: 1956-11-21  MR#: [id number]  LOV#:[id number]  Patient Care Team: [name], MD as PCP - General (Internal Medicine) [name], MD (Internal Medicine)  CHIEF COMPLAINT: Hereditary hemochromatosis, compound heterozygote for C282Y and H63D mutations.  INTERVAL HISTORY: Patient returns to clinic today for repeat laboratory work, further evaluation, and consideration of additional phlebotomy. She currently feels well and is asymptomatic. She occasionally feels tired. She has just started on Chantix to try and quit smoking. She has no neurologic complaints. She denies any fevers. She has a good appetite and denies weight loss. She has no chest pain or shortness of breath. She denies any nausea, vomiting, constipation, or diarrhea. She has no urinary complaints. Patient feels at her baseline and offers no specific complaints today.  REVIEW OF SYSTEMS:   Review of Systems  Constitutional: Positive for malaise/fatigue. Negative for fever and weight loss.  HENT: Negative.   Respiratory: Negative.  Negative for cough and shortness of breath.   Cardiovascular: Negative.  Negative for chest pain and leg swelling.  Gastrointestinal: Negative.  Negative for abdominal pain.  Genitourinary: Negative.   Musculoskeletal: Negative.   Neurological: Negative.  Negative for weakness.  Psychiatric/Behavioral: Negative.  The patient is not nervous/anxious.     As per HPI. Otherwise, a complete review of systems is negative.  PAST MEDICAL HISTORY: Past Medical History:  Diagnosis Date  . Carotid artery occlusion   . Hyperlipidemia   . Hypocalcemia   . hypothyroidism 2007   acquired, secondary to surgery for thyroid Ca  . Migraine    improved since thyroid surgery, 2 years  . multinodular goiter 2007   s/p thyroidectomy  . 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 Repeat angiography in November of 2014 showed 50% distal aortic stenosis extending into the left common iliac artery which had 60-70% ostial stenosis. No other significant disease. I placed on balloon expandable stent into  left common iliac artery extending slightly into the distal aorta    PAST SURGICAL HISTORY: Past Surgical History:  Procedure Laterality Date  . ABDOMINAL AORTAGRAM N/A 10/14/2013   Procedure: ABDOMINAL [name];  Surgeon: Wellington Hampshire, MD;  Location: [facility];  Service: Cardiovascular;  Laterality: N/A;  . ANGIOPLASTY ILLIAC ARTERY    . CESAREAN SECTION    . NOSE SURGERY    . PERIPHERAL VASCULAR CATHETERIZATION N/A 11/14/2016   Procedure: Abdominal Aortogram;  Surgeon: Wellington Hampshire, MD;  Location: [facility];  Service: Cardiovascular;  Laterality: N/A;  . PERIPHERAL VASCULAR CATHETERIZATION Bilateral 11/14/2016   Procedure: Lower Extremity Angiography;  Surgeon: Wellington Hampshire, MD;  Location: [facility];  Service: Cardiovascular;  Laterality: Bilateral;  limited iliacs only  . PERIPHERAL VASCULAR CATHETERIZATION Left 11/14/2016   Procedure: Peripheral Vascular Intervention;  Surgeon: Wellington Hampshire, MD;  Location: [facility];  Service: Cardiovascular;  Laterality: Left;  common iliac  . SKIN CANCER EXCISION    . THYROID SURGERY  2007   secondary to thyroid cancer    FAMILY HISTORY: Family History  Problem Relation Age of Onset  . Alzheimer's disease Father   . Stroke Mother   . Coronary artery disease Maternal Grandmother   . Heart disease Maternal Grandmother 70  . Coronary artery disease Paternal Grandmother 8  . Heart disease Paternal Grandmother 95  ADVANCED DIRECTIVES (Y/N):  N  HEALTH MAINTENANCE: Social History  Substance Use Topics  . Smoking status: Current Every Day Smoker    Packs/day: 0.50    Years: 20.00    Types:  Cigarettes    Last attempt to quit: 10/28/2011  . Smokeless tobacco: Never Used  . Alcohol use No     Colonoscopy:  PAP:  Bone density:  Lipid panel:  Allergies  Allergen Reactions  . Atorvastatin Other (See Comments)    Myalgias   . Darvocet [Propoxyphene N-Acetaminophen] Nausea And Vomiting  . Demerol Nausea And Vomiting  . Methocarbamol Other (See Comments)    UNKNOWN  . Mirtazapine     Sleeplessness, nausea   . Ambien [Zolpidem Tartrate] Palpitations    Anxiety , trembling.    Current Outpatient Prescriptions  Medication Sig Dispense Refill  . ALPRAZolam (XANAX) 1 MG tablet TAKE ONE TABLET BY MOUTH AT BEDTIME AS NEEDED FOR ANXIETY 30 tablet 5  . aspirin 81 MG tablet Take 1 tablet (81 mg total) by mouth daily. 30 tablet   . clopidogrel (PLAVIX) 75 MG tablet Take 1 tablet (75 mg total) by mouth daily. 30 tablet 5  . gabapentin (NEURONTIN) 100 MG capsule TAKE 1 CAPSULE BY MOUTH 3 TIMES DAILY. INCREASE GRADUALLY TO 6 CAPSULES DAILY. 180 capsule 1  . levothyroxine (SYNTHROID, LEVOTHROID) 112 MCG tablet TAKE ONE (1) TABLET BY MOUTH EVERY DAY 90 tablet 1  . metoprolol succinate (TOPROL-XL) 25 MG 24 hr tablet Take 0.5 tablets (12.5 mg total) by mouth daily. At bedtime 30 tablet 3  . Multiple Vitamin (MULTIVITAMIN WITH MINERALS) TABS tablet Take 1 tablet by mouth daily.    . rosuvastatin (CRESTOR) 40 MG tablet Take 1 tablet (40 mg total) by mouth daily. 90 tablet 3  . varenicline (CHANTIX CONTINUING MONTH PAK) 1 MG tablet Take 1 tablet (1 mg total) by mouth 2 (two) times daily. 60 tablet 1  . varenicline (CHANTIX STARTING MONTH PAK) 0.5 MG X 11 & 1 MG X 42 tablet Take one 0.5 mg tablet by mouth once daily for 3 days, then increase to one 0.5 mg tablet twice daily for 4 days, then increase to one 1 mg tablet twice daily. 53 tablet 0  . vitamin B-12 (CYANOCOBALAMIN) 1000 MCG tablet Take 1 tablet (1,000 mcg total) by mouth daily. 90 tablet 1   No current facility-administered  medications for this visit.     OBJECTIVE: There were no vitals filed for this visit.   There is no height or weight on file to calculate BMI.    ECOG FS:0 - Asymptomatic  General: Well-developed, well-nourished, no acute distress. Eyes: Pink conjunctiva, anicteric sclera. Lungs: Clear to auscultation bilaterally. Heart: Regular rate and rhythm. No rubs, murmurs, or gallops. Abdomen: Soft, nontender, nondistended. No organomegaly noted, normoactive bowel sounds. Musculoskeletal: No edema, cyanosis, or clubbing. Neuro: Alert, answering all questions appropriately. Cranial nerves grossly intact. Skin: No rashes or petechiae noted. Psych: Normal affect.   LAB RESULTS:  Lab Results  Component Value Date   NA 139 07/22/2017   K 3.7 07/22/2017   CL 104 07/22/2017   CO2 28 07/22/2017   GLUCOSE 77 07/22/2017   BUN 8 07/22/2017   CREATININE 0.74 07/22/2017   CALCIUM 9.6 07/22/2017   PROT 7.1 07/22/2017   ALBUMIN 4.2 07/22/2017   AST 23 07/22/2017   ALT 19 07/22/2017   ALKPHOS 85 07/22/2017   BILITOT 0.4 07/22/2017   GFRNONAA 94 11/05/2016   GFRAA 109 11/05/2016  Lab Results  Component Value Date   WBC 13.4 (H) 07/15/2017   NEUTROABS 8.5 (H) 07/15/2017   HGB 16.1 (H) 07/15/2017   HCT 45.9 07/15/2017   MCV 103.2 (H) 07/15/2017   PLT 231 07/15/2017   Lab Results  Component Value Date   IRON 140 07/15/2017   TIBC 395 07/15/2017   IRONPCTSAT 35 (H) 07/15/2017   Lab Results  Component Value Date   FERRITIN 39 07/15/2017     STUDIES: No results found.  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  PLAN:    1. Hereditary hemochromatosis: Patient's hemoglobin is mildly elevated, but essentially unchanged. Her ferritin has improved and is within goal range. Ferritin is 66 today. She does not need phlebotomy today. Patient will then return to clinic in 1 month for labs only and in 3 months with labs/MD and possible  phlebotomy.   2. Leukocytosis: Possibly reactive, monitor. 3. Elevated MCV: Possibly related to recent phlebotomies and smoking. She has started on Chantix. Continue to monitor.   Patient expressed understanding and was in agreement with this plan. She also understands that She can call clinic at any time with any questions, concerns, or complaints.    Lloyd Huger, MD   09/14/2017 8:37 AM

## 2017-09-16 ENCOUNTER — Inpatient Hospital Stay: Payer: BC Managed Care – PPO | Admitting: Oncology

## 2017-09-16 ENCOUNTER — Inpatient Hospital Stay: Payer: BC Managed Care – PPO

## 2017-09-25 NOTE — Progress Notes (Signed)
[facility]  [phone] Fax:(336) [phone]  ID: Tracie Morris OB: January 04, 1956  MR#: [id number]  HEN#:[id number]  Patient Care Team: [name], MD as PCP - General (Internal Medicine) [name], MD (Internal Medicine)  CHIEF COMPLAINT: Hereditary hemochromatosis, compound heterozygote for C282Y and H63D mutations.  INTERVAL HISTORY: Patient returns to clinic today for repeat laboratory work, further evaluation, and consideration of additional phlebotomy. She continues to feel well and is asymptomatic. She has no neurologic complaints. She denies any fevers. She has a good appetite and denies weight loss. She has no chest pain or shortness of breath. She denies any nausea, vomiting, constipation, or diarrhea. She has no urinary complaints. Patient feels at her baseline and offers no specific complaints today.  REVIEW OF SYSTEMS:   Review of Systems  Constitutional: Negative.  Negative for fever, malaise/fatigue and weight loss.  HENT: Negative.   Respiratory: Negative.  Negative for cough and shortness of breath.   Cardiovascular: Negative.  Negative for chest pain and leg swelling.  Gastrointestinal: Negative.  Negative for abdominal pain.  Genitourinary: Negative.   Musculoskeletal: Negative.   Neurological: Negative.  Negative for weakness.  Psychiatric/Behavioral: Negative.  The patient is not nervous/anxious.     As per HPI. Otherwise, a complete review of systems is negative.  PAST MEDICAL HISTORY: Past Medical History:  Diagnosis Date  . Carotid artery occlusion   . Hyperlipidemia   . Hypocalcemia   . hypothyroidism 2007   acquired, secondary to surgery for thyroid Ca  . Migraine    improved since thyroid surgery, 2 years  . multinodular goiter 2007   s/p thyroidectomy  . 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 Repeat  angiography in November of 2014 showed 50% distal aortic stenosis extending into the left common iliac artery which had 60-70% ostial stenosis. No other significant disease. I placed on balloon expandable stent into  left common iliac artery extending slightly into the distal aorta    PAST SURGICAL HISTORY: Past Surgical History:  Procedure Laterality Date  . ANGIOPLASTY ILLIAC ARTERY    . CESAREAN SECTION    . NOSE SURGERY    . SKIN CANCER EXCISION    . THYROID SURGERY  2007   secondary to thyroid cancer    FAMILY HISTORY: Family History  Problem Relation Age of Onset  . Alzheimer's disease Father   . Stroke Mother   . Coronary artery disease Maternal Grandmother   . Heart disease Maternal Grandmother 70  . Coronary artery disease Paternal Grandmother 67  . Heart disease Paternal Grandmother 7    ADVANCED DIRECTIVES (Y/N):  N  HEALTH MAINTENANCE: Social History   Tobacco Use  . Smoking status: Current Every Day Smoker    Packs/day: 0.50    Years: 20.00    Pack years: 10.00    Types: Cigarettes    Last attempt to quit: 10/28/2011    Years since quitting: 5.9  . Smokeless tobacco: Never Used  Substance Use Topics  . Alcohol use: No  . Drug use: No     Colonoscopy:  PAP:  Bone density:  Lipid panel:  Allergies  Allergen Reactions  . Atorvastatin Other (See Comments)    Myalgias   . Darvocet [Propoxyphene N-Acetaminophen] Nausea And Vomiting  . Demerol Nausea And Vomiting  . Methocarbamol Other (See Comments)    UNKNOWN  . Mirtazapine  Sleeplessness, nausea   . Ambien [Zolpidem Tartrate] Palpitations    Anxiety , trembling.    Current Outpatient Medications  Medication Sig Dispense Refill  . ALPRAZolam (XANAX) 1 MG tablet TAKE ONE TABLET BY MOUTH AT BEDTIME AS NEEDED FOR ANXIETY 30 tablet 5  . clopidogrel (PLAVIX) 75 MG tablet Take 1 tablet (75 mg total) by mouth daily. 30 tablet 5  . gabapentin (NEURONTIN) 100 MG capsule TAKE 1 CAPSULE BY MOUTH 3  TIMES DAILY. INCREASE GRADUALLY TO 6 CAPSULES DAILY. 180 capsule 1  . levothyroxine (SYNTHROID, LEVOTHROID) 112 MCG tablet TAKE ONE (1) TABLET BY MOUTH EVERY DAY 90 tablet 1  . Multiple Vitamin (MULTIVITAMIN WITH MINERALS) TABS tablet Take 1 tablet by mouth daily.    . rosuvastatin (CRESTOR) 40 MG tablet Take 1 tablet (40 mg total) by mouth daily. 90 tablet 3  . vitamin B-12 (CYANOCOBALAMIN) 1000 MCG tablet Take 1 tablet (1,000 mcg total) by mouth daily. 90 tablet 1   No current facility-administered medications for this visit.     OBJECTIVE: Vitals:   09/30/17 1359  BP: 139/83  Pulse: 81  Resp: 20  Temp: (!) 96.5 F (35.8 C)     Body mass index is 18.69 kg/m.    ECOG FS:0 - Asymptomatic  General: Well-developed, well-nourished, no acute distress. Eyes: Pink conjunctiva, anicteric sclera. Lungs: Clear to auscultation bilaterally. Heart: Regular rate and rhythm. No rubs, murmurs, or gallops. Abdomen: Soft, nontender, nondistended. No organomegaly noted, normoactive bowel sounds. Musculoskeletal: No edema, cyanosis, or clubbing. Neuro: Alert, answering all questions appropriately. Cranial nerves grossly intact. Skin: No rashes or petechiae noted. Psych: Normal affect.   LAB RESULTS:  Lab Results  Component Value Date   NA 139 07/22/2017   K 3.7 07/22/2017   CL 104 07/22/2017   CO2 28 07/22/2017   GLUCOSE 77 07/22/2017   BUN 8 07/22/2017   CREATININE 0.74 07/22/2017   CALCIUM 9.6 07/22/2017   PROT 7.1 07/22/2017   ALBUMIN 4.2 07/22/2017   AST 23 07/22/2017   ALT 19 07/22/2017   ALKPHOS 85 07/22/2017   BILITOT 0.4 07/22/2017   GFRNONAA 94 11/05/2016   GFRAA 109 11/05/2016    Lab Results  Component Value Date   WBC 13.4 (H) 07/15/2017   NEUTROABS 8.5 (H) 07/15/2017   HGB 16.1 (H) 07/15/2017   HCT 45.9 07/15/2017   MCV 103.2 (H) 07/15/2017   PLT 231 07/15/2017   Lab Results  Component Value Date   IRON 140 07/15/2017   TIBC 395 07/15/2017   IRONPCTSAT 35  (H) 07/15/2017   Lab Results  Component Value Date   FERRITIN 39 07/15/2017     STUDIES: No results found.  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  PLAN:    1. Hereditary hemochromatosis: Patient had her lab work completed at [facility] with a normal hemoglobin at 14.9.  Her ferritin is mildly elevated at 103.  She does not require phlebotomy today.  After lengthy discussion with the patient, secondary to cost have recommended that she have CBC and iron stores are drawn at her primary care physician every 3 months.  If her ferritin is greater than 100, patient will go have phlebotomy at [facility].  She has been given a order form for her phlebotomy.  Return to clinic in 1 year for further evaluation.     2. Leukocytosis: Possibly reactive, monitor. 3. Elevated MCV: Possibly related to recent phlebotomies and smoking. She has started  on Chantix. Continue to monitor.   Approximately 30 minutes spent in discussion of which greater than 50% was consultation.  Patient expressed understanding and was in agreement with this plan. She also understands that She can call clinic at any time with any questions, concerns, or complaints.    Lloyd Huger, MD   09/30/2017 3:05 PM

## 2017-09-30 ENCOUNTER — Encounter: Payer: Self-pay | Admitting: Oncology

## 2017-09-30 ENCOUNTER — Inpatient Hospital Stay: Payer: BC Managed Care – PPO | Attending: Oncology | Admitting: Oncology

## 2017-09-30 DIAGNOSIS — R718 Other abnormality of red blood cells: Secondary | ICD-10-CM | POA: Diagnosis not present

## 2017-09-30 DIAGNOSIS — E038 Other specified hypothyroidism: Secondary | ICD-10-CM

## 2017-09-30 DIAGNOSIS — D72829 Elevated white blood cell count, unspecified: Secondary | ICD-10-CM

## 2017-09-30 DIAGNOSIS — Z79899 Other long term (current) drug therapy: Secondary | ICD-10-CM | POA: Diagnosis not present

## 2017-09-30 DIAGNOSIS — I739 Peripheral vascular disease, unspecified: Secondary | ICD-10-CM | POA: Diagnosis not present

## 2017-09-30 DIAGNOSIS — E785 Hyperlipidemia, unspecified: Secondary | ICD-10-CM | POA: Diagnosis not present

## 2017-09-30 DIAGNOSIS — Z85850 Personal history of malignant neoplasm of thyroid: Secondary | ICD-10-CM | POA: Diagnosis not present

## 2017-09-30 DIAGNOSIS — F17210 Nicotine dependence, cigarettes, uncomplicated: Secondary | ICD-10-CM | POA: Diagnosis not present

## 2017-09-30 DIAGNOSIS — Z85828 Personal history of other malignant neoplasm of skin: Secondary | ICD-10-CM

## 2017-09-30 DIAGNOSIS — Z7902 Long term (current) use of antithrombotics/antiplatelets: Secondary | ICD-10-CM | POA: Diagnosis not present

## 2017-09-30 NOTE — Progress Notes (Signed)
Patient denies any concerns today.  

## 2017-10-01 ENCOUNTER — Encounter: Payer: Self-pay | Admitting: Oncology

## 2017-10-04 ENCOUNTER — Other Ambulatory Visit: Payer: Self-pay | Admitting: Cardiovascular Disease

## 2017-10-14 ENCOUNTER — Telehealth: Payer: Self-pay | Admitting: Internal Medicine

## 2017-10-14 NOTE — Telephone Encounter (Signed)
Pt dropped off lab orders from Dr. [name]. He would like patient to have her labs done at [facility] office 4x a year. Orders are up front in Dr. Lupita Dawn color folder.

## 2017-10-15 ENCOUNTER — Other Ambulatory Visit: Payer: Self-pay | Admitting: Radiology

## 2017-10-15 NOTE — Telephone Encounter (Signed)
Dr. [name] would like for pt to have CBC w/ diff w/ platelet, ferritin, and iron and TIBC drawn every 3 months at our office. Is this okay?

## 2017-10-15 NOTE — Telephone Encounter (Signed)
Standing orders placed

## 2017-10-15 NOTE — Telephone Encounter (Signed)
E83.110     hereditary hemochromatosis .  Thank you!

## 2017-10-15 NOTE — Telephone Encounter (Signed)
Need appropriate diagnosis to order labs.

## 2017-10-15 NOTE — Telephone Encounter (Signed)
Britanny,  Can you order the CBC w diff/platelet,  Ferritin,  Iron and TIBC as a 3 month recurring order? Next one du mid January   Thank you

## 2017-12-19 ENCOUNTER — Other Ambulatory Visit: Payer: Self-pay | Admitting: *Deleted

## 2017-12-19 DIAGNOSIS — I739 Peripheral vascular disease, unspecified: Secondary | ICD-10-CM

## 2017-12-27 ENCOUNTER — Other Ambulatory Visit: Payer: Self-pay | Admitting: Internal Medicine

## 2018-02-03 ENCOUNTER — Ambulatory Visit: Payer: BC Managed Care – PPO | Admitting: Internal Medicine

## 2018-02-03 ENCOUNTER — Other Ambulatory Visit: Payer: Self-pay | Admitting: Internal Medicine

## 2018-02-03 ENCOUNTER — Encounter: Payer: Self-pay | Admitting: Internal Medicine

## 2018-02-03 DIAGNOSIS — Z720 Tobacco use: Secondary | ICD-10-CM

## 2018-02-03 DIAGNOSIS — E782 Mixed hyperlipidemia: Secondary | ICD-10-CM

## 2018-02-03 DIAGNOSIS — E278 Other specified disorders of adrenal gland: Secondary | ICD-10-CM

## 2018-02-03 DIAGNOSIS — E279 Disorder of adrenal gland, unspecified: Secondary | ICD-10-CM | POA: Diagnosis not present

## 2018-02-03 DIAGNOSIS — R918 Other nonspecific abnormal finding of lung field: Secondary | ICD-10-CM | POA: Diagnosis not present

## 2018-02-03 DIAGNOSIS — R251 Tremor, unspecified: Secondary | ICD-10-CM | POA: Diagnosis not present

## 2018-02-03 DIAGNOSIS — R634 Abnormal weight loss: Secondary | ICD-10-CM

## 2018-02-03 DIAGNOSIS — Z716 Tobacco abuse counseling: Secondary | ICD-10-CM | POA: Diagnosis not present

## 2018-02-03 DIAGNOSIS — G4700 Insomnia, unspecified: Secondary | ICD-10-CM | POA: Diagnosis not present

## 2018-02-03 DIAGNOSIS — E032 Hypothyroidism due to medicaments and other exogenous substances: Secondary | ICD-10-CM | POA: Diagnosis not present

## 2018-02-03 DIAGNOSIS — K760 Fatty (change of) liver, not elsewhere classified: Secondary | ICD-10-CM | POA: Diagnosis not present

## 2018-02-03 DIAGNOSIS — R911 Solitary pulmonary nodule: Secondary | ICD-10-CM | POA: Diagnosis not present

## 2018-02-03 DIAGNOSIS — E538 Deficiency of other specified B group vitamins: Secondary | ICD-10-CM

## 2018-02-03 DIAGNOSIS — F411 Generalized anxiety disorder: Secondary | ICD-10-CM

## 2018-02-03 LAB — COMPREHENSIVE METABOLIC PANEL
ALBUMIN: 3.9 g/dL (ref 3.5–5.2)
ALT: 13 U/L (ref 0–35)
AST: 17 U/L (ref 0–37)
Alkaline Phosphatase: 108 U/L (ref 39–117)
BUN: 8 mg/dL (ref 6–23)
CHLORIDE: 105 meq/L (ref 96–112)
CO2: 27 meq/L (ref 19–32)
CREATININE: 0.73 mg/dL (ref 0.40–1.20)
Calcium: 9.7 mg/dL (ref 8.4–10.5)
GFR: 85.69 mL/min (ref >60.00)
GLUCOSE: 100 mg/dL — AB (ref 70–99)
Potassium: 4 mEq/L (ref 3.5–5.1)
SODIUM: 140 meq/L (ref 135–145)
Total Bilirubin: 0.5 mg/dL (ref 0.2–1.2)
Total Protein: 6.6 g/dL (ref 6.0–8.3)

## 2018-02-03 LAB — CBC WITH DIFFERENTIAL/PLATELET
BASOS PCT: 0.5 % (ref 0.0–3.0)
Basophils Absolute: 0.1 10*3/uL (ref 0.0–0.1)
EOS PCT: 1.2 % (ref 0.0–5.0)
Eosinophils Absolute: 0.2 10*3/uL (ref 0.0–0.7)
HCT: 48.4 % — ABNORMAL HIGH (ref 36.0–46.0)
Hemoglobin: 16.4 g/dL — ABNORMAL HIGH (ref 12.0–15.0)
LYMPHS ABS: 3.4 10*3/uL (ref 0.7–4.0)
Lymphocytes Relative: 23.9 % (ref 12.0–46.0)
MCHC: 33.9 g/dL (ref 30.0–36.0)
MCV: 106.8 fl — AB (ref 78.0–100.0)
MONO ABS: 0.9 10*3/uL (ref 0.1–1.0)
MONOS PCT: 6.4 % (ref 3.0–12.0)
NEUTROS ABS: 9.6 10*3/uL — AB (ref 1.4–7.7)
NEUTROS PCT: 68 % (ref 43.0–77.0)
Platelets: 273 10*3/uL (ref 150.0–400.0)
RBC: 4.53 Mil/uL (ref 3.87–5.11)
RDW: 13 % (ref 11.5–15.5)
WBC: 14.1 10*3/uL — ABNORMAL HIGH (ref 4.0–10.5)

## 2018-02-03 LAB — LIPID PANEL
CHOLESTEROL: 165 mg/dL (ref 0–200)
HDL: 51.7 mg/dL (ref >39.00)
LDL Cholesterol: 94 mg/dL (ref 0–99)
NONHDL: 113.56
Total CHOL/HDL Ratio: 3
Triglycerides: 96 mg/dL (ref 0.0–149.0)
VLDL: 19.2 mg/dL (ref 0.0–40.0)

## 2018-02-03 LAB — IRON,TIBC AND FERRITIN PANEL
%SAT: 33 % (calc) (ref 11–50)
Ferritin: 62 ng/mL (ref 20–288)
Iron: 112 ug/dL (ref 45–160)
TIBC: 339 mcg/dL (calc) (ref 250–450)

## 2018-02-03 LAB — TSH: TSH: 6.36 u[IU]/mL — AB (ref 0.35–4.50)

## 2018-02-03 MED ORDER — PRIMIDONE 50 MG PO TABS: 50.0000 mg | ORAL_TABLET | Freq: Every day | ORAL | 2 refills | Status: DC

## 2018-02-03 NOTE — Patient Instructions (Addendum)
Trial of primodone for hand tremor.  Start with 1/2 tablet at bedtime.   Use 1/4 tablet alprazolam  Instead of half until you experience the full effect of primidone.  The usual treatment dose for tremor is 50 mg,  But we can go higher if needed and tolerated.   You are overdue for chest ct to follow up on the pulmonary   resume the escitalopram  For your anxiety .  starting with 5 mg at or after dinner

## 2018-02-03 NOTE — Progress Notes (Addendum)
Subjective:  Patient ID: Tracie Morris, female    DOB: 04-30-1956  Age: 62 y.o. MRN: [id number]  CC: The primary encounter diagnosis was Hereditary hemochromatosis ([place]). Diagnoses of Iatrogenic hypothyroidism, B12 deficiency, Weight loss, Mixed hyperlipidemia, Adrenal mass, left ([place]), GAD (generalized anxiety disorder), Insomnia, persistent, and Tremor of right hand were also pertinent to this visit.  HPI:  Tracie Morris presents for follow up on multiple chronic issues   Cc: worsening right hand tremor.  Stopped taking lexapro because it didn't help her tremor. Beta blocker making her tired.  Gabapentin 200 mg not working either.  Not taking it daily,  No priro trial of primidone  Anxiety out of control,  Worried about husband's declining health,  Son's health issues,  And mounting responsibilities at work. Aggravated also by medication changes (wellbutrin)   She is overdue for repeat Chest CT for follow up on stable 5 mm RML nodule  Unintentional Weight loss of 14 lbs sinc last year.  Small appetite . Eats twice daily ,  Gets full quickly due to small stomach secondary to history of Nissen fundupliucation  Chronic insomnia due to stress,  Uses 1/2 alprazolam nightly   Tobacco abuse:  Coerced to take wellbutrin by [place] tobacco cessation  employee program to be in compliance for health insurance.  Still smoking 1 pack daily.  Since she has been taking the wellbutrin for the past months,  Her tremor and anxiety worse.  Prior rx for chantix was given to her by Dr. Fletcher Anon and was started but discontinued due to  the side effects, which were significant (nausea, which improved with time;  But Son noticed a very negative change in mood, said she was acting "crazy.") she does admit that during use of chantix she was able to taper down from a pack to 3-4 cigs daily   . Outpatient Medications Prior to Visit  Medication Sig Dispense Refill  . ALPRAZolam (XANAX) 1 MG tablet TAKE ONE TABLET  BY MOUTH AT BEDTIME AS NEEDED FOR ANXIETY 30 tablet 5  . clopidogrel (PLAVIX) 75 MG tablet TAKE ONE (1) TABLET BY MOUTH EVERY DAY 30 tablet 2  . Multiple Vitamin (MULTIVITAMIN WITH MINERALS) TABS tablet Take 1 tablet by mouth daily.    . rosuvastatin (CRESTOR) 40 MG tablet Take 1 tablet (40 mg total) by mouth daily. 90 tablet 3  . vitamin B-12 (CYANOCOBALAMIN) 1000 MCG tablet Take 1 tablet (1,000 mcg total) by mouth daily. 90 tablet 1  . buPROPion (WELLBUTRIN SR) 150 MG 12 hr tablet TAKE 1 TABLET BY MOUTH FOR THE FIRST 3 DAYS, THEN TWICE DAILY THEREAFTER FOR 28 DAYS.  0  . gabapentin (NEURONTIN) 100 MG capsule TAKE ONE CAPSULE BY MOUTH THREE TIMES DAILY INCREASING GRADUALLY TO SIX CAPSULE AS DIRECTED 180 capsule 0  . levothyroxine (SYNTHROID, LEVOTHROID) 112 MCG tablet TAKE ONE (1) TABLET BY MOUTH EVERY DAY 90 tablet 1   No facility-administered medications prior to visit.     Review of Systems;  Patient denies headache, fevers, malaise, unintentional weight loss, skin rash, eye pain, sinus congestion and sinus pain, sore throat, dysphagia,  hemoptysis , cough, dyspnea, wheezing, chest pain, palpitations, orthopnea, edema, abdominal pain, nausea, melena, diarrhea, constipation, flank pain, dysuria, hematuria, urinary  Frequency, nocturia, numbness, tingling, seizures,  Focal weakness, Loss of consciousness,  Tremor, insomnia, depression, anxiety, and suicidal ideation.      Objective:  BP 132/80 (BP Location: Left Arm, Patient Position: Sitting, Cuff Size: Normal)   Pulse 89  Temp 98.1 F (36.7 C) (Oral)   Resp 15   Ht 5\' 7"  (1.702 m)   Wt 113 lb 3.2 oz (51.3 kg)   SpO2 97%   BMI 17.73 kg/m   BP Readings from Last 3 Encounters:  02/03/18 132/80  09/30/17 139/83  07/22/17 116/64    Wt Readings from Last 3 Encounters:  02/03/18 113 lb 3.2 oz (51.3 kg)  09/30/17 119 lb 4.8 oz (54.1 kg)  07/22/17 121 lb (54.9 kg)    General appearance: alert, cooperative and appears stated  age Ears: normal TM's and external ear canals both ears Throat: lips, mucosa, and tongue normal; teeth and gums normal Neck: no adenopathy, no carotid bruit, supple, symmetrical, trachea midline and thyroid not enlarged, symmetric, no tenderness/mass/nodules Back: symmetric, no curvature. ROM normal. No CVA tenderness. Lungs: clear to auscultation bilaterally Heart: regular rate and rhythm, S1, S2 normal, no murmur, click, rub or gallop Abdomen: soft, non-tender; bowel sounds normal; no masses,  no organomegaly Pulses: 2+ and symmetric Skin: Skin color, texture, turgor normal. No rashes or lesions Lymph nodes: Cervical, supraclavicular, and axillary nodes normal.  No results found for: HGBA1C  Lab Results  Component Value Date   CREATININE 0.73 02/03/2018   CREATININE 0.74 07/22/2017   CREATININE 0.74 12/31/2016    Lab Results  Component Value Date   WBC 14.1 (H) 02/03/2018   HGB 16.4 (H) 02/03/2018   HCT 48.4 (H) 02/03/2018   PLT 273.0 02/03/2018   GLUCOSE 100 (H) 02/03/2018   CHOL 165 02/03/2018   TRIG 96.0 02/03/2018   HDL 51.70 02/03/2018   LDLCALC 94 02/03/2018   ALT 13 02/03/2018   AST 17 02/03/2018   NA 140 02/03/2018   K 4.0 02/03/2018   CL 105 02/03/2018   CREATININE 0.73 02/03/2018   BUN 8 02/03/2018   CO2 27 02/03/2018   TSH 6.36 (H) 02/03/2018   INR 1.0 11/05/2016   MICROALBUR 0.6 01/11/2014    No results found.  Assessment & Plan:   Problem List Items Addressed This Visit    Adrenal mass, left ([facility])    Nonfunctioning by prior endocrine workup and PET scanning.  Continue annual surveillance imaging with dr. [name].      GAD (generalized anxiety disorder)    aggravated by use of wellbutrin for tobacco cessation.  Stopping medication.  Resuming lexapro starting at 5 mg daily       Hereditary hemochromatosis ([place]) - Primary    ETIOLOGY CONFIRMED WITH GENETIC TESTING.  Hematology managing with monthly surveillance and therapeutic phlebotomy.   Aggravated by ongoing tobacco abuse   Lab Results  Component Value Date   WBC 14.1 (H) 02/03/2018   HGB 16.4 (H) 02/03/2018   HCT 48.4 (H) 02/03/2018   MCV 106.8 (H) 02/03/2018   PLT 273.0 02/03/2018   Lab Results  Component Value Date   IRON 112 02/03/2018   TIBC 339 02/03/2018   FERRITIN 62 02/03/2018        Relevant Orders   CBC with Differential/Platelet (Completed)   Iron, TIBC and Ferritin Panel (Completed)   Hyperlipidemia    LDL is 94 on crestor 40 mg. May be a candidate for adding repatha (goal is < 70)  Lab Results  Component Value Date   CHOL 165 02/03/2018   HDL 51.70 02/03/2018   LDLCALC 94 02/03/2018   TRIG 96.0 02/03/2018   CHOLHDL 3 02/03/2018   Lab Results  Component Value Date   LDLCALC 94 02/03/2018  Relevant Orders   Lipid panel (Completed)   Iatrogenic hypothyroidism    Thyroid is UNDERACTIVE on current dose of 112  Mcg.  Managed by Dr [name], but will increase dose to 125 mcg.   Lab Results  Component Value Date   TSH 6.36 (H) 02/03/2018         Relevant Medications   levothyroxine (SYNTHROID, LEVOTHROID) 125 MCG tablet   Other Relevant Orders   TSH (Completed)   T4, free (Completed)   Insomnia, persistent    Managed with alprazolam.  Advised to resume lexapro and reduce dose to 0.5 mg with initiation of primidone for tremor       Tremor of right hand    Adding primidone 50 mg in the evening        Other Visit Diagnoses    B12 deficiency       Relevant Orders   Vitamin B12 (Completed)   Weight loss       Relevant Orders   Comprehensive metabolic panel (Completed)      I have discontinued Tracie Morris's gabapentin and buPROPion. I have also changed her levothyroxine. Additionally, I am having her maintain her multivitamin with minerals, rosuvastatin, vitamin B-12, ALPRAZolam, clopidogrel, and primidone.  Meds ordered this encounter  Medications  . DISCONTD: primidone (MYSOLINE) 50 MG tablet    Sig: Take 1  tablet (50 mg total) by mouth at bedtime. For essential tremor    Dispense:  30 tablet    Refill:  2  . primidone (MYSOLINE) 50 MG tablet    Sig: Take 1 tablet (50 mg total) by mouth at bedtime. For essential tremor    Dispense:  30 tablet    Refill:  2  . levothyroxine (SYNTHROID, LEVOTHROID) 125 MCG tablet    Sig: Take 1 tablet (125 mcg total) by mouth daily before breakfast.    Dispense:  90 tablet    Refill:  0   In addition to spending 40 minutes  with patient,  more than half of which was spent in counseling patient on the above mentioned issues , reviewing and explaining recent labs and imaging studies done, and coordination of care, I spent 3 minutes with patient counselling  Her on the continued risks inherent to ongoing tobacco abuse and encouraged her to make an effort to quit . Marland Kitchen  Medications Discontinued During This Encounter  Medication Reason  . buPROPion (WELLBUTRIN SR) 150 MG 12 hr tablet   . gabapentin (NEURONTIN) 100 MG capsule   . primidone (MYSOLINE) 50 MG tablet Reorder  . levothyroxine (SYNTHROID, LEVOTHROID) 112 MCG tablet     Follow-up: No Follow-up on file.   [name], MD

## 2018-02-04 DIAGNOSIS — K700 Alcoholic fatty liver: Secondary | ICD-10-CM | POA: Insufficient documentation

## 2018-02-04 DIAGNOSIS — Z716 Tobacco abuse counseling: Secondary | ICD-10-CM | POA: Insufficient documentation

## 2018-02-04 LAB — T4, FREE: FREE T4: 1.14 ng/dL (ref 0.60–1.60)

## 2018-02-04 LAB — VITAMIN B12: Vitamin B-12: 529 pg/mL (ref 211–911)

## 2018-02-04 MED ORDER — LEVOTHYROXINE SODIUM 125 MCG PO TABS: 125.0000 ug | ORAL_TABLET | Freq: Every day | ORAL | 0 refills | Status: DC

## 2018-02-04 NOTE — Assessment & Plan Note (Signed)
Suggested by last CT chest.  Repeat CT pending

## 2018-02-04 NOTE — Assessment & Plan Note (Signed)
Nonfunctioning by prior endocrine workup and PET scanning.  Continue annual surveillance imaging with dr. [name].

## 2018-02-04 NOTE — Assessment & Plan Note (Addendum)
ETIOLOGY CONFIRMED WITH GENETIC TESTING.  Hematology managing with monthly surveillance and therapeutic phlebotomy.  Aggravated by ongoing tobacco abuse   Lab Results  Component Value Date   WBC 14.1 (H) 02/03/2018   HGB 16.4 (H) 02/03/2018   HCT 48.4 (H) 02/03/2018   MCV 106.8 (H) 02/03/2018   PLT 273.0 02/03/2018   Lab Results  Component Value Date   IRON 112 02/03/2018   TIBC 339 02/03/2018   FERRITIN 62 02/03/2018

## 2018-02-04 NOTE — Assessment & Plan Note (Signed)
Adding primidone 50 mg in the evening

## 2018-02-04 NOTE — Assessment & Plan Note (Signed)
aggravated by use of wellbutrin for tobacco cessation.  Stopping medication.  Resuming lexapro starting at 5 mg daily

## 2018-02-04 NOTE — Assessment & Plan Note (Signed)
Managed with alprazolam.  Advised to resume lexapro and reduce dose to 0.5 mg with initiation of primidone for tremor

## 2018-02-04 NOTE — Assessment & Plan Note (Signed)
Thyroid is UNDERACTIVE on current dose of 112  Mcg.  Managed by Dr [name], but will increase dose to 125 mcg.   Lab Results  Component Value Date   TSH 6.36 (H) 02/03/2018

## 2018-02-04 NOTE — Assessment & Plan Note (Signed)
LDL is 94 on crestor 40 mg. May be a candidate for adding repatha (goal is < 70)  Lab Results  Component Value Date   CHOL 165 02/03/2018   HDL 51.70 02/03/2018   LDLCALC 94 02/03/2018   TRIG 96.0 02/03/2018   CHOLHDL 3 02/03/2018   Lab Results  Component Value Date   LDLCALC 94 02/03/2018

## 2018-02-04 NOTE — Assessment & Plan Note (Signed)
Stable, No change by June 2017 chest CT .  Repeat CT is due

## 2018-02-04 NOTE — Assessment & Plan Note (Signed)
Will consider repeat trial of chantix once anxiety is better controlled,  Since she had better success In reduction of use than welbutrin which as aggravated her tremor and her anxiety.    5 minutes was spent in face to face time discussing patient's current tobacco abuse,  Prior attempts to quit, reviewing the current and future hazards to patient's  health,  And assessing and discussing patient's  level of motivation to quit. Patient was encouraged to consider pharmacotherapy again in the future .  

## 2018-02-04 NOTE — Telephone Encounter (Signed)
Refilled: 07/22/2017 Last OV: 02/03/2018 Next OV: 08/11/2018

## 2018-02-06 NOTE — Telephone Encounter (Signed)
Printed, signed and faxed.  

## 2018-02-13 ENCOUNTER — Ambulatory Visit
Admission: RE | Admit: 2018-02-13 | Discharge: 2018-02-13 | Disposition: A | Discharge status: Home / Self Care | Payer: BC Managed Care – PPO | Source: Ambulatory Visit | Attending: Internal Medicine | Admitting: Internal Medicine

## 2018-02-13 DIAGNOSIS — D3502 Benign neoplasm of left adrenal gland: Secondary | ICD-10-CM | POA: Diagnosis not present

## 2018-02-13 DIAGNOSIS — R911 Solitary pulmonary nodule: Secondary | ICD-10-CM | POA: Diagnosis not present

## 2018-02-13 DIAGNOSIS — I2510 Atherosclerotic heart disease of native coronary artery without angina pectoris: Secondary | ICD-10-CM | POA: Insufficient documentation

## 2018-02-13 DIAGNOSIS — R918 Other nonspecific abnormal finding of lung field: Secondary | ICD-10-CM | POA: Diagnosis present

## 2018-02-13 DIAGNOSIS — J432 Centrilobular emphysema: Secondary | ICD-10-CM | POA: Insufficient documentation

## 2018-02-13 DIAGNOSIS — I700 Atherosclerosis of aorta: Secondary | ICD-10-CM | POA: Diagnosis not present

## 2018-02-14 ENCOUNTER — Other Ambulatory Visit: Payer: Self-pay | Admitting: Cardiovascular Disease

## 2018-02-14 DIAGNOSIS — I779 Disorder of arteries and arterioles, unspecified: Secondary | ICD-10-CM

## 2018-02-24 ENCOUNTER — Ambulatory Visit: Payer: BC Managed Care – PPO | Admitting: Internal Medicine

## 2018-02-24 VITALS — BP 126/68 | HR 80 | Ht 67.0 in | Wt 112.8 lb

## 2018-02-24 DIAGNOSIS — E279 Disorder of adrenal gland, unspecified: Secondary | ICD-10-CM | POA: Diagnosis not present

## 2018-02-24 DIAGNOSIS — E278 Other specified disorders of adrenal gland: Secondary | ICD-10-CM

## 2018-02-24 DIAGNOSIS — E032 Hypothyroidism due to medicaments and other exogenous substances: Secondary | ICD-10-CM

## 2018-02-24 LAB — POTASSIUM: Potassium: 3.5 mEq/L (ref 3.5–5.1)

## 2018-02-24 MED ORDER — DEXAMETHASONE 1 MG PO TABS: ORAL_TABLET | ORAL | 0 refills | Status: DC

## 2018-02-24 MED ORDER — LEVOTHYROXINE SODIUM 112 MCG PO TABS: 112.0000 ug | ORAL_TABLET | Freq: Every day | ORAL | 5 refills | Status: DC

## 2018-02-24 NOTE — Patient Instructions (Signed)
Please decrease Levothyroxine 112 mcg daily.  Take the thyroid hormone every day, with water, at least 30 minutes before breakfast, separated by at least 4 hours from: - acid reflux medications - calcium - iron - multivitamins  Please come back for labs in 1.5 months.  Please stop at the lab.  Please return in 1 year.

## 2018-02-24 NOTE — Progress Notes (Signed)
Patient ID: Tracie Morris, female   DOB: Aug 09, 1956, 62 y.o.   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 Last visit 1 year ago.  She had a lot of stress since last visit: husband ESRD, son getting married, stress at work.  Reviewed and addended history: Pt's adrenal mass was incidentally found during investigation for kidney stones in 01/2015.  Abdominal CT from 06/2015: Left adrenal nodule of 9 mm measuring -11 HU.  PET scan on 08/02/2015 and this nodule was not hypermetabolic.  Chest CT (05/21/2016): I have discussed with the radiologist that read the original CT scan (Dr. Ardeen Garland) >> her adrenal adenoma appears stable compared with a CT scan obtained in 2016.  Chest CT (02/13/2018): There is a stable adenoma in the left adrenal measuring 1.3 x 1.2 cm.  No h/o hypokalemia, HTN, hyperglycemia, spells of HA + HTN. She does have a h/o migraines, relieved by prn Plavix (!).   Reviewed previous investigations along with the patient ns along with pt: urine metanephrines, normetanephrines and catecholamines were normal.  However, at last check, normetanephrine and total metanephrines were slightly elevated.  This is a nonspecific finding so we did not repeat the labs at that time.    Plasma renin activity and aldosterone levels were normal.  Dexamethasone suppression tests were also normal.  Reviewed latest results with the patient: Component     Latest Ref Rng & Units 02/18/2017          Epinephrine      <LLN  Norepinephrine     pg/mL 558  Dopamine      <LLN  Catecholamines, Total     pg/mL 558  PRA LC/MS/MS     0.25 - 5.82 ng/mL/h 0.33  ALDO / PRA Ratio     0.9 - 28.9 Ratio 9.1  ALDOSTERONE     ng/dL 3  Metanephrine, Pl     <=57 pg/mL 46  Normetanephrine, Pl     <=148 pg/mL 170 (H)  Total Metanephrines-Plasma     <=205 pg/mL 216 (H)  TSH     0.35 - 4.50 uIU/mL  5.22 (H)  T4,Free(Direct)     0.60 - 1.60 ng/dL 0.88  Potassium     3.5 - 5.1 mEq/L 3.9   Component     Latest Ref Rng & Units 06/04/2017  Cortisol - AM     mcg/dL 2.9 (L)    Previously: Component     Latest Ref Rng 08/23/2015 08/31/2015 01/16/2016 02/06/2016  Total Volume - CF 24Hr U         1400  Epinephrine, 24 hr Urine         REPORT  Norepinephrine, 24 hr Ur     15 - 100 mcg/24 h    19  Calculated Total (E+NE)     26 - 121 mcg/24 h    19 (L)  Dopamine, 24 hr Urine     52 - 480 mcg/24 h    116  Creatinine, Urine mg/day-CATEUR     0.63 - 2.50 g/24 h    0.65  Epinephrine      REPORT     Norepinephrine      534     Dopamine      REPORT     Catecholamines, Total      534     PRA LC/MS/MS     0.25 - 5.82 ng/mL/h 0.47  1.63   ALDO / PRA Ratio     0.9 - 28.9 Ratio 6.4  6.7   ALDOSTERONE      3  11   Metanephrine, Pl     <=57 pg/mL 38     Normetanephrine, Pl     <=148 pg/mL 151 (H)     Total Metanephrines-Plasma     <=205 pg/mL 189     Metanephrines, Ur     90 - 315 mcg/24 h    51 (L)  Normetanephr.,U,24h     122 - 676 mcg/24 h    128  Metanephrine, Ur     224 - 832 mcg/24 h    179 (L)  Potassium     3.5 - 5.1 mEq/L 4.0     Cortisol, Plasma       1.2 9.0    Postsurgical hypothyroidism: Patient had total thyroidectomy in 2007 for presumed thyroid cancer - Dr Carlis Abbott (ENT); 1 parathyroid removed. Final pathology after thyroidectomy >> benign.   Pt is on levothyroxine 125 mcg daily (increased 2 weeks ago), taken: - in am - fasting - at least 30 min from b'fast - no Ca, Fe, PPIs - we moved MVI later in the day at last visit - not on Biotin  She had palpitations and she is sleeping less well after she increase her levothyroxine dose.  - Latest TSH levels: Lab Results  Component Value Date   TSH 6.36 (H) 02/03/2018   TSH 1.28 07/22/2017   TSH 0.22 (L) 06/04/2017   TSH 5.22 (H) 02/18/2017   TSH 0.38 12/31/2016   TSH 1.81 02/20/2016   FREET4 1.14  02/03/2018   FREET4 1.06 07/22/2017   FREET4 1.40 06/04/2017   FREET4 0.88 02/18/2017   FREET4 1.05 12/31/2016   FREET4 1.33 02/20/2016   She has hemochromatosis.   ROS: Constitutional: no weight gain/+ weight loss, no fatigue, + subjective hyperthermia, no subjective hypothermia, + insomnia  After the change in dose Eyes: no blurry vision, no xerophthalmia ENT: no sore throat, no nodules palpated in throat, no dysphagia, no odynophagia, no hoarseness Cardiovascular: no CP/no SOB/+ palpitations 1 week after increasing the LT4 dose/no leg swelling Respiratory: no cough/no SOB/no wheezing Gastrointestinal: no N/no V/no D/no C/no acid reflux Musculoskeletal: no muscle aches/no joint aches Skin: + rash, + hair loss Neurological: + R tremor/no numbness/no tingling/no dizziness  I reviewed pt's medications, allergies, PMH, social hx, family hx, and changes were documented in the history of present illness. Otherwise, unchanged from my initial visit note.  Past Medical History:  Diagnosis Date  . Carotid artery occlusion   . Hyperlipidemia   . Hypocalcemia   . hypothyroidism 2007   acquired, secondary to surgery for thyroid Ca  . Migraine    improved since thyroid surgery, 2 years  . multinodular goiter 2007   s/p thyroidectomy  . 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 Repeat angiography in November of 2014 showed 50% distal aortic stenosis extending into the left common iliac artery which had 60-70% ostial stenosis. No other significant disease. I placed on balloon expandable stent into  left common iliac artery extending slightly into the distal aorta   Past Surgical History:  Procedure Laterality Date  . ABDOMINAL AORTAGRAM N/A 10/14/2013   Procedure: ABDOMINAL [name];  Surgeon: Wellington Hampshire, MD;  Location: [place] [facility];  Service: Cardiovascular;  Laterality: N/A;  .  ANGIOPLASTY  ILLIAC ARTERY    . CESAREAN SECTION    . NOSE SURGERY    . PERIPHERAL VASCULAR CATHETERIZATION N/A 11/14/2016   Procedure: Abdominal Aortogram;  Surgeon: Wellington Hampshire, MD;  Location: [place] [facility];  Service: Cardiovascular;  Laterality: N/A;  . PERIPHERAL VASCULAR CATHETERIZATION Bilateral 11/14/2016   Procedure: Lower Extremity Angiography;  Surgeon: Wellington Hampshire, MD;  Location: [place] [facility];  Service: Cardiovascular;  Laterality: Bilateral;  limited iliacs only  . PERIPHERAL VASCULAR CATHETERIZATION Left 11/14/2016   Procedure: Peripheral Vascular Intervention;  Surgeon: Wellington Hampshire, MD;  Location: [place] [facility];  Service: Cardiovascular;  Laterality: Left;  common iliac  . SKIN CANCER EXCISION    . THYROID SURGERY  2007   secondary to thyroid cancer   Social History   Social History  . Marital Status: Married    Spouse Name: N/A  . Number of Children: 1   Occupational History  . Manager    Social History Main Topics  . Smoking status: Former Smoker -- 1.00 packs/day for 20 years    Types: Cigarettes    Quit date: 10/28/2011  . Smokeless tobacco: Never Used  . Alcohol Use: No  . Drug Use: No   Current Outpatient Medications on File Prior to Visit  Medication Sig Dispense Refill  . ALPRAZolam (XANAX) 1 MG tablet TAKE 1 TABLET BY MOUTH AT BEDTIME AS NEEDED ANXIETY 30 tablet 5  . clopidogrel (PLAVIX) 75 MG tablet TAKE ONE (1) TABLET BY MOUTH EVERY DAY 30 tablet 2  . levothyroxine (SYNTHROID, LEVOTHROID) 125 MCG tablet Take 1 tablet (125 mcg total) by mouth daily before breakfast. 90 tablet 0  . Multiple Vitamin (MULTIVITAMIN WITH MINERALS) TABS tablet Take 1 tablet by mouth daily.    . primidone (MYSOLINE) 50 MG tablet Take 1 tablet (50 mg total) by mouth at bedtime. For essential tremor 30 tablet 2  . rosuvastatin (CRESTOR) 40 MG tablet Take 1 tablet (40 mg total) by mouth daily. 90 tablet 3  . vitamin B-12 (CYANOCOBALAMIN) 1000 MCG  tablet Take 1 tablet (1,000 mcg total) by mouth daily. 90 tablet 1   No current facility-administered medications on file prior to visit.    Allergies  Allergen Reactions  . Atorvastatin Other (See Comments)    Myalgias   . Darvocet [Propoxyphene N-Acetaminophen] Nausea And Vomiting  . Demerol Nausea And Vomiting  . Methocarbamol Other (See Comments)    UNKNOWN  . Mirtazapine     Sleeplessness, nausea   . Ambien [Zolpidem Tartrate] Palpitations    Anxiety , trembling.   Family History  Problem Relation Age of Onset  . Alzheimer's disease Father   . Stroke Mother   . Coronary artery disease Maternal Grandmother   . Heart disease Maternal Grandmother 70  . Coronary artery disease Paternal Grandmother 60  . Heart disease Paternal Grandmother 59   PE: BP 126/68   Pulse 80   Ht 5\' 7"  (1.702 m)   Wt 112 lb 12.8 oz (51.2 kg)   SpO2 98%   BMI 17.67 kg/m  Body mass index is 17.67 kg/m. Wt Readings from Last 3 Encounters:  02/24/18 112 lb 12.8 oz (51.2 kg)  02/03/18 113 lb 3.2 oz (51.3 kg)  09/30/17 119 lb 4.8 oz (54.1 kg)   Constitutional: thin, in NAD Eyes: PERRLA, EOMI, no exophthalmos ENT: moist mucous membranes, no thyromegaly, no cervical lymphadenopathy Cardiovascular: RRR, No MRG Respiratory: CTA B Gastrointestinal: abdomen soft, NT, ND, BS+ Musculoskeletal: no deformities,  strength intact in all 4 Skin: moist, warm, no rashes Neurological: + tremor with outstretched right hand, DTR normal in all 4  ASSESSMENT: 1. Adrenal incidentaloma  2. Postsurgical hypothyroidism  3. 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 Right thyroid: Nodular hyperplasia B. Left thyroid: Nodular hyperplasia with 1.9 cm dominant adenomatoid nodule. Parathyroid tissue in a single section, measuring 0.7 mm. Conclusion: There is nodular hyperplasia (multinodular goiter). There is no  evidence of malignancy.   PLAN:  1. Patient with a left 9 mm adrenal nodule discovered incidentally during workup for kidney stones - Previous hormonal workup and imaging indicate a benign, nonsecreting nodule. - To differentiate between a functioning and a nonfunctioning adrenal nodule, we ruled out hypersecretion by checking the following tests: - dexamethasone suppression test to rule out Cushing syndrome (6% of adrenal incidentalomas). If the cortisol level returns >5, will need 24h urine free cortisol.  She had 3 suppressed free cortisol level so far.  We will repeat now. - Plasma fractionated metanephrines and catecholamines to rule out pheochromocytoma (3% of adrenal incidentalomas).  These were normal so far with the exception of last check, when the normetanephrines were elevated.  This is a nonspecific finding.  We will repeat this today, however she has a lot of stress going on and I will not be surprised if the levels are higher - Plasma renin activity and aldosterone level to rule out primary hyperaldosteronism (0.6% of adrenal incidentalomas).  The levels were normal so far.  We will repeat today. -We again discussed about follow-up:  hormonal testing yearly for 5 years -she had 3 sets >> we will repeat now  CT scans yearly x1-2 - had a chest CT that includes the adrenal >> I have discussed with the radiologist that read the original CT scan (Dr. Ardeen Garland) >> her adrenal adenoma appears stable compared with a CT scan obtained in 2016. She had another CT scan this year >> reviewed report: stable adenoma.  No need to repeat the CT scan. -  I will see her back in 1 year  2. Hypothyroidism - latest thyroid labs reviewed with pt >>  slightly high, after which we increased her levothyroxine dose to 125.  However, she mentions that she may have skipped a few doses of the 112 mcg dose before the last TSH returned... - She did have a low TSH which was checked actually after taking the  dexamethasone the night before, therefore, the level is not accurate so it should be ignored. - she continues on LT4 125 mcg daily - After she started the above dose, she started to have more palpitations and is sleeping less.  We discussed about trying to decrease the dose to 112 mcg daily  And retesting in 1.5 months and then see if she needs the increasing dose afterwards.  She agrees with this. - we discussed about taking the thyroid hormone every day, with water, >30 minutes before breakfast, separated by >4 hours from acid reflux medications, calcium, iron, multivitamins. Pt. is taking it correctly now, after we moved to multivitamins later in the day.  Orders Placed This Encounter  Procedures  . Aldosterone + renin activity w/ ratio  . Catecholamines, fractionated, plasma  . Metanephrines, plasma  . Potassium   Component     Latest Ref Rng & Units 02/24/2018  Epinephrine     pg/mL 31  Norepinephrine     pg/mL 697  Dopamine     pg/mL 30  Catecholamines, Total     pg/mL 728  Metanephrine, Pl     <=57 pg/mL <25  Normetanephrine, Pl     <=148 pg/mL 197 (H)  Total Metanephrines-Plasma     <=205 pg/mL 197  ALDOSTERONE      ng/dL 5  Renin Activity     0.25 - 5.82 ng/mL/h 0.44  ALDO / PRA Ratio     0.9 - 28.9 Ratio 11.4  Potassium     3.5 - 5.1 mEq/L 3.5  Cortisol level pending.  Labs normal, except slightly high normetanephrine.  This is nonspecific, possibly related to stress.  We will repeat her labs again in 1 year.  Philemon Kingdom, MD PhD [facility] Endocrinology

## 2018-02-27 ENCOUNTER — Encounter: Payer: Self-pay | Admitting: Internal Medicine

## 2018-03-02 LAB — CATECHOLAMINES, FRACTIONATED, PLASMA
CATECHOLAMINES, TOTAL: 728 pg/mL
Dopamine: 30 pg/mL
EPINEPHRINE: 31 pg/mL
NOREPINEPHRINE: 697 pg/mL

## 2018-03-02 LAB — METANEPHRINES, PLASMA
Normetanephrine, Free: 197 pg/mL — ABNORMAL HIGH (ref <=148)
Total Metanephrines-Plasma: 197 pg/mL (ref <=205)

## 2018-03-02 LAB — ALDOSTERONE + RENIN ACTIVITY W/ RATIO
ALDO / PRA RATIO: 11.4 ratio (ref 0.9–28.9)
Aldosterone: 5 ng/dL
RENIN ACTIVITY: 0.44 ng/mL/h (ref 0.25–5.82)

## 2018-03-03 ENCOUNTER — Ambulatory Visit (INDEPENDENT_AMBULATORY_CARE_PROVIDER_SITE_OTHER): Payer: BC Managed Care – PPO

## 2018-03-03 DIAGNOSIS — I739 Peripheral vascular disease, unspecified: Secondary | ICD-10-CM | POA: Diagnosis not present

## 2018-03-05 ENCOUNTER — Telehealth: Payer: Self-pay | Admitting: Cardiovascular Disease

## 2018-03-05 ENCOUNTER — Other Ambulatory Visit: Payer: Self-pay

## 2018-03-05 DIAGNOSIS — I739 Peripheral vascular disease, unspecified: Secondary | ICD-10-CM

## 2018-03-05 NOTE — Telephone Encounter (Signed)
Patient calling Patient spoke with Ivin Booty the other day about test results - she recalls Ivin Booty saying that she will not need to be seen in a year - she has an appt on 5/7 with Dr Fletcher Anon and is wondering if she should keep that appt or if she will not have to have a test in a year Please call with clarification

## 2018-03-05 NOTE — Progress Notes (Unsigned)
[id number]

## 2018-03-05 NOTE — Telephone Encounter (Signed)
I spoke with patient to clarify test results and follow up testing is in one year. She understands to keep scheduled 04/01/18 OV with Dr. Fletcher Anon.

## 2018-03-25 ENCOUNTER — Other Ambulatory Visit: Payer: Self-pay

## 2018-03-25 ENCOUNTER — Telehealth: Payer: Self-pay | Admitting: Internal Medicine

## 2018-03-25 NOTE — Telephone Encounter (Signed)
dexamethasone (DECADRON) 1 MG tablet  She stated she also needs orders sent over to the [facility] station for the labs she would get the next morning after taking the pill Please advise

## 2018-03-25 NOTE — Telephone Encounter (Signed)
Patient stated she usually has blood work done at the [facility].  She stated that they needed orders sent to them for her so she can get this done.     Please call patient when this has been sent   [facility] Fax.   [phone]

## 2018-03-25 NOTE — Telephone Encounter (Signed)
Spoke to patient.  Went over labs,  lab result and office visit notes on 04/01.  Patient verbalized understanding.

## 2018-04-01 ENCOUNTER — Ambulatory Visit: Payer: BC Managed Care – PPO | Admitting: Cardiovascular Disease

## 2018-04-01 ENCOUNTER — Encounter (INDEPENDENT_AMBULATORY_CARE_PROVIDER_SITE_OTHER): Payer: Self-pay

## 2018-04-01 ENCOUNTER — Encounter: Payer: Self-pay | Admitting: Cardiovascular Disease

## 2018-04-01 VITALS — BP 118/60 | HR 88 | Ht 67.5 in | Wt 113.2 lb

## 2018-04-01 DIAGNOSIS — E785 Hyperlipidemia, unspecified: Secondary | ICD-10-CM | POA: Diagnosis not present

## 2018-04-01 DIAGNOSIS — Z720 Tobacco use: Secondary | ICD-10-CM

## 2018-04-01 DIAGNOSIS — I779 Disorder of arteries and arterioles, unspecified: Secondary | ICD-10-CM | POA: Diagnosis not present

## 2018-04-01 DIAGNOSIS — I739 Peripheral vascular disease, unspecified: Secondary | ICD-10-CM

## 2018-04-01 MED ORDER — VARENICLINE TARTRATE 0.5 MG X 11 & 1 MG X 42 PO MISC: ORAL | 0 refills | Status: DC

## 2018-04-01 MED ORDER — VARENICLINE TARTRATE 1 MG PO TABS: 1.0000 mg | ORAL_TABLET | Freq: Two times a day (BID) | ORAL | 1 refills | Status: DC

## 2018-04-01 NOTE — Patient Instructions (Signed)
Medication Instructions: - Your physician has recommended you make the following change in your medication:   1) START Chantix: - starter pack as directed on the bottle - maintenance dose as directed on the bottle   Labwork: - none ordered  Procedures/Testing: - none ordered  Follow-Up: - Your physician wants you to follow-up in: 6 months with Dr. Fletcher Anon.  You will receive a reminder letter in the mail two months in advance. If you don't receive a letter, please call our office to schedule the follow-up appointment.   Any Additional Special Instructions Will Be Listed Below (If Applicable).     If you need a refill on your cardiac medications before your next appointment, please call your pharmacy.

## 2018-04-01 NOTE — Progress Notes (Signed)
Cardiology Office Note   Date:  04/01/2018   ID:  Tracie Morris, DOB 1956-01-15, MRN [id number]  PCP:  [name], MD  Cardiologist:   [name], MD   Chief Complaint  Patient presents with  . Other    past due 6 month follow up. patient c/o chest pain, SOB, and swelling in left ankle. Meds reviewed verbally with patient.       History of Present Illness: Tracie Morris is a 62 y.o. female who presents for a followup visit regarding peripheral arterial disease.  She is status post left common iliac artery stent placement in November of 2014 for significant claudication with subsequent covered stent placement in 2017 for in situ thrombosis with subtotal occlusion. She was diagnosed with hereditary hemochromatosis site doses which might have been the trigger for very late stent thrombosis.  Other Medical history include  tobacco use and hyperlipidemia   Previous CT scan of the chest did show calcifications in the coronary arteries.  She has been doing well with no recent chest pain or shortness of breath.  No significant leg claudication.  She admits that she has not been consistent in taking rosuvastatin.  She was prescribed Chantix last year but only took it for 2 weeks.  She stopped the medication due to side effects that were thought to be due to other neuropsychiatric medications that she was taking at the same time.  She is interested in quitting smoking. She also continues to lose weight.  She reports having no appetite.  Past Medical History:  Diagnosis Date  . Carotid artery occlusion   . Hyperlipidemia   . Hypocalcemia   . hypothyroidism 2007   acquired, secondary to surgery for thyroid Ca  . Migraine    improved since thyroid surgery, 2 years  . multinodular goiter 2007   s/p thyroidectomy  . 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 Repeat  angiography in November of 2014 showed 50% distal aortic stenosis extending into the left common iliac artery which had 60-70% ostial stenosis. No other significant disease. I placed on balloon expandable stent into  left common iliac artery extending slightly into the distal aorta    Past Surgical History:  Procedure Laterality Date  . ABDOMINAL AORTAGRAM N/A 10/14/2013   Procedure: ABDOMINAL [name];  Surgeon: Wellington Hampshire, MD;  Location: [place] [facility];  Service: Cardiovascular;  Laterality: N/A;  . ANGIOPLASTY ILLIAC ARTERY    . CESAREAN SECTION    . NOSE SURGERY    . PERIPHERAL VASCULAR CATHETERIZATION N/A 11/14/2016   Procedure: Abdominal Aortogram;  Surgeon: Wellington Hampshire, MD;  Location: [place] [facility];  Service: Cardiovascular;  Laterality: N/A;  . PERIPHERAL VASCULAR CATHETERIZATION Bilateral 11/14/2016   Procedure: Lower Extremity Angiography;  Surgeon: Wellington Hampshire, MD;  Location: [place] [facility];  Service: Cardiovascular;  Laterality: Bilateral;  limited iliacs only  . PERIPHERAL VASCULAR CATHETERIZATION Left 11/14/2016   Procedure: Peripheral Vascular Intervention;  Surgeon: Wellington Hampshire, MD;  Location: [place] [facility];  Service: Cardiovascular;  Laterality: Left;  common iliac  . SKIN CANCER EXCISION    . THYROID SURGERY  2007   secondary to thyroid cancer     Current Outpatient Medications  Medication Sig Dispense Refill  . ALPRAZolam (XANAX) 1 MG tablet TAKE 1 TABLET BY MOUTH AT BEDTIME AS NEEDED ANXIETY 30 tablet 5  .  clopidogrel (PLAVIX) 75 MG tablet TAKE ONE (1) TABLET BY MOUTH EVERY DAY 30 tablet 2  . dexamethasone (DECADRON) 1 MG tablet Take 1 tablet by mouth once at 11 pm, before coming for labs at 8 am the next morning 1 tablet 0  . levothyroxine (SYNTHROID, LEVOTHROID) 112 MCG tablet Take 1 tablet (112 mcg total) by mouth daily before breakfast. 45 tablet 5  . Multiple Vitamin (MULTIVITAMIN WITH MINERALS) TABS tablet Take 1 tablet by mouth  daily.    . rosuvastatin (CRESTOR) 40 MG tablet Take 1 tablet (40 mg total) by mouth daily. 90 tablet 3   No current facility-administered medications for this visit.     Allergies:   Atorvastatin; Darvocet [propoxyphene n-acetaminophen]; Demerol; Methocarbamol; Mirtazapine; and Ambien [zolpidem tartrate]    Social History:  The patient  reports that she has been smoking cigarettes.  She has a 10.00 pack-year smoking history. She has never used smokeless tobacco. She reports that she does not drink alcohol or use drugs.   Family History:  The patient's family history includes Alzheimer's disease in her father; Coronary artery disease in her maternal grandmother; Coronary artery disease (age of onset: 13) in her paternal grandmother; Heart disease (age of onset: 36) in her maternal grandmother; Heart disease (age of onset: 86) in her paternal grandmother; Stroke in her mother.    ROS:  Please see the history of present illness.   Otherwise, review of systems are positive for none.   All other systems are reviewed and negative.    PHYSICAL EXAM: VS:  BP 118/60 (BP Location: Left Arm, Patient Position: Sitting, Cuff Size: Normal)   Pulse 88   Ht 5' 7.5" (1.715 m)   Wt 113 lb 4 oz (51.4 kg)   BMI 17.48 kg/m  , BMI Body mass index is 17.48 kg/m. GEN: Well nourished, well developed, in no acute distress  HEENT: normal  Neck: no JVD, carotid bruits, or masses Cardiac: RRR; no murmurs, rubs, or gallops,no edema  Respiratory:  clear to auscultation bilaterally, normal work of breathing GI: soft, nontender, nondistended, + BS MS: no deformity or atrophy  Skin: warm and dry, no rash Neuro:  Strength and sensation are intact Psych: euthymic mood, full affect Vascular: Femoral pulse: Normal bilaterally . Posterior tibial is normal bilaterally. Dorsalis pedis is normal on the right side and +1 on the left side. No ulceration  EKG:  EKG is  ordered today. EKG showed normal sinus rhythm with no  significant ST or T wave changes.   Recent Labs: 02/03/2018: ALT 13; BUN 8; Creatinine, Ser 0.73; Hemoglobin 16.4; Platelets 273.0; Sodium 140; TSH 6.36 02/24/2018: Potassium 3.5    Lipid Panel    Component Value Date/Time   CHOL 165 02/03/2018 0846   TRIG 96.0 02/03/2018 0846   HDL 51.70 02/03/2018 0846   CHOLHDL 3 02/03/2018 0846   VLDL 19.2 02/03/2018 0846   LDLCALC 94 02/03/2018 0846      Wt Readings from Last 3 Encounters:  04/01/18 113 lb 4 oz (51.4 kg)  02/24/18 112 lb 12.8 oz (51.2 kg)  02/03/18 113 lb 3.2 oz (51.3 kg)        ASSESSMENT AND PLAN:  1.  Peripheral arterial disease: Status post left common iliac artery covered stent placement due to in situ thrombosis. Continue lifelong dual antiplatelet therapy. Recent vascular studies showed normal ABI and patent left common iliac artery stent.  Repeat studies in 1 year.  2. Hyperlipidemia:  Continue high-dose rosuvastatin. Most recent LDL  was 94.  I discussed with her the importance of taking rosuvastatin regularly instead of intermittently.  3. Carotid disease: This was mild on recent carotid Doppler.  4. Coronary calcifications: No anginal symptoms. Continue aggressive treatment of risk factors.  5. Tobacco use: Unfortunately, she continues to smoke.  She wants to try Chantix again and this was prescribed.  Disposition:   FU with me in 6 months  Signed,  [name], MD  04/01/2018 2:38 PM    [place]

## 2018-05-19 ENCOUNTER — Telehealth: Payer: Self-pay | Admitting: *Deleted

## 2018-05-19 NOTE — Telephone Encounter (Signed)
Lab appointment s scheduled.

## 2018-05-19 NOTE — Telephone Encounter (Signed)
Copied from [place] [id number]. Topic: General - Other >> May 19, 2018  3:07 PM Oneta Rack wrote:  Relation to pt: self  Call back number: [phone]    Reason for call:  Patient states her chart should reflect standing orders from oncologist Lloyd Huger, MD to have labs completed at PCP office, please advise  Patient states she would like to come to office tomorrow morning to have labs drawn, ordering physician endocrinologist Philemon Kingdom, MD, please advise

## 2018-05-30 ENCOUNTER — Telehealth: Payer: Self-pay | Admitting: Radiology

## 2018-05-30 DIAGNOSIS — E032 Hypothyroidism due to medicaments and other exogenous substances: Secondary | ICD-10-CM

## 2018-05-30 DIAGNOSIS — E782 Mixed hyperlipidemia: Secondary | ICD-10-CM

## 2018-05-30 NOTE — Telephone Encounter (Signed)
Fasting labs ordered

## 2018-05-30 NOTE — Addendum Note (Signed)
Addended by: [name] on: 05/30/2018 01:10 PM   Modules accepted: Orders

## 2018-05-30 NOTE — Telephone Encounter (Signed)
Pt coming in for labs Monday, please place future orders. Thank you 

## 2018-06-02 ENCOUNTER — Other Ambulatory Visit (INDEPENDENT_AMBULATORY_CARE_PROVIDER_SITE_OTHER): Payer: BC Managed Care – PPO

## 2018-06-02 DIAGNOSIS — E782 Mixed hyperlipidemia: Secondary | ICD-10-CM

## 2018-06-02 DIAGNOSIS — E032 Hypothyroidism due to medicaments and other exogenous substances: Secondary | ICD-10-CM

## 2018-06-02 LAB — TSH: TSH: 4.9 u[IU]/mL — ABNORMAL HIGH (ref 0.35–4.50)

## 2018-06-02 LAB — COMPREHENSIVE METABOLIC PANEL
ALT: 44 U/L — ABNORMAL HIGH (ref 0–35)
AST: 60 U/L — AB (ref 0–37)
Albumin: 4 g/dL (ref 3.5–5.2)
Alkaline Phosphatase: 129 U/L — ABNORMAL HIGH (ref 39–117)
BUN: 8 mg/dL (ref 6–23)
CALCIUM: 9.5 mg/dL (ref 8.4–10.5)
CHLORIDE: 100 meq/L (ref 96–112)
CO2: 27 meq/L (ref 19–32)
CREATININE: 0.77 mg/dL (ref 0.40–1.20)
GFR: 80.49 mL/min (ref >60.00)
Glucose, Bld: 152 mg/dL — ABNORMAL HIGH (ref 70–99)
POTASSIUM: 3.7 meq/L (ref 3.5–5.1)
SODIUM: 137 meq/L (ref 135–145)
Total Bilirubin: 0.5 mg/dL (ref 0.2–1.2)
Total Protein: 6.9 g/dL (ref 6.0–8.3)

## 2018-06-02 LAB — LIPID PANEL
CHOL/HDL RATIO: 2
Cholesterol: 183 mg/dL (ref 0–200)
HDL: 73.2 mg/dL (ref >39.00)
LDL CALC: 92 mg/dL (ref 0–99)
NonHDL: 109.41
Triglycerides: 89 mg/dL (ref 0.0–149.0)
VLDL: 17.8 mg/dL (ref 0.0–40.0)

## 2018-06-02 NOTE — Addendum Note (Signed)
Addended by: Arby Barrette on: 06/02/2018 09:56 AM   Modules accepted: Orders

## 2018-06-03 LAB — IRON AND TIBC
IRON SATURATION: 83 % — AB (ref 15–55)
IRON: 279 ug/dL — AB (ref 27–139)
Total Iron Binding Capacity: 338 ug/dL (ref 250–450)
UIBC: 59 ug/dL — AB (ref 118–369)

## 2018-06-03 LAB — FERRITIN: FERRITIN: 140 ng/mL (ref 15–150)

## 2018-06-05 ENCOUNTER — Other Ambulatory Visit: Payer: Self-pay | Admitting: Internal Medicine

## 2018-06-05 DIAGNOSIS — E032 Hypothyroidism due to medicaments and other exogenous substances: Secondary | ICD-10-CM

## 2018-06-05 MED ORDER — LEVOTHYROXINE SODIUM 125 MCG PO TABS: 125.0000 ug | ORAL_TABLET | Freq: Every day | ORAL | 0 refills | Status: DC

## 2018-06-06 ENCOUNTER — Telehealth: Payer: Self-pay | Admitting: Radiology

## 2018-06-06 NOTE — Telephone Encounter (Signed)
error 

## 2018-06-09 ENCOUNTER — Other Ambulatory Visit (INDEPENDENT_AMBULATORY_CARE_PROVIDER_SITE_OTHER): Payer: BC Managed Care – PPO

## 2018-06-09 DIAGNOSIS — E279 Disorder of adrenal gland, unspecified: Secondary | ICD-10-CM | POA: Diagnosis not present

## 2018-06-09 DIAGNOSIS — E278 Other specified disorders of adrenal gland: Secondary | ICD-10-CM

## 2018-06-09 NOTE — Addendum Note (Signed)
Addended by: Nanci Pina on: 06/09/2018 09:44 AM   Modules accepted: Orders

## 2018-06-09 NOTE — Addendum Note (Signed)
Addended by: Nanci Pina on: 06/09/2018 10:22 AM   Modules accepted: Orders

## 2018-06-09 NOTE — Progress Notes (Signed)
ecg

## 2018-06-11 LAB — CORTISOL-AM, BLOOD: CORTISOL - AM: 2.6 ug/dL — AB

## 2018-06-23 DIAGNOSIS — C4492 Squamous cell carcinoma of skin, unspecified: Secondary | ICD-10-CM

## 2018-06-23 HISTORY — DX: Squamous cell carcinoma of skin, unspecified: C44.92

## 2018-06-24 ENCOUNTER — Telehealth: Payer: Self-pay | Admitting: Internal Medicine

## 2018-06-24 NOTE — Telephone Encounter (Signed)
I called Tracie Morris to inquire about the scheduled order to have a EKG 12 lead done. Does Dr Derrel Nip want Tracie Morris to have it done at Tracie Morris cardiologist? Please advise? Thank you!

## 2018-06-24 NOTE — Telephone Encounter (Signed)
I  d Tracie Morris' recall why the ekg was ordered.  She is eeing cardiology so they can do it if they feel it is necessary

## 2018-06-27 NOTE — Telephone Encounter (Signed)
I spoke with pt regarding getting EKG done with Cardio doctor and pt says ok she sees cardio in six months. Thank you!

## 2018-06-30 ENCOUNTER — Telehealth: Payer: Self-pay | Admitting: *Deleted

## 2018-06-30 NOTE — Telephone Encounter (Signed)
Called reporting that she is having difficulty bouncing back after her phlebotomies. States it takes her 2 weeks to feel better. She states she has lost 12 # and thinks that has something to do with it and that she is having too much drawn off.  I gave her an appointment to come in to discuss this with doctor and she accepted appointment 07/14/18

## 2018-07-13 NOTE — Progress Notes (Signed)
[place]  [phone] [phone]  ID: Tracie Morris OB: [birth year]  MR#: [id number]  FIE#:[id number]  Patient Care Team: [name], MD as PCP - General (Internal Medicine) [name], MD (Internal Medicine)  CHIEF COMPLAINT: Hereditary hemochromatosis, compound heterozygote for C282Y and H63D mutations.  INTERVAL HISTORY: Patient was last evaluated in clinic on September 30, 2017.  She continues to get phlebotomy completed at [facility], but has noticed recently that she has increasing weakness and fatigue for 1 to 2 weeks after her phlebotomy and feels that too much blood is being withdrawn.  She currently feels well and is asymptomatic.  She has no neurologic complaints. She denies any fevers. She has a good appetite and denies weight loss. She has no chest pain or shortness of breath. She denies any nausea, vomiting, constipation, or diarrhea. She has no urinary complaints.  Patient feels at her baseline offers no further specific complaints today.  REVIEW OF SYSTEMS:   Review of Systems  Constitutional: Negative.  Negative for fever, malaise/fatigue and weight loss.  HENT: Negative.   Respiratory: Negative.  Negative for cough and shortness of breath.   Cardiovascular: Negative.  Negative for chest pain and leg swelling.  Gastrointestinal: Negative.  Negative for abdominal pain.  Genitourinary: Negative.  Negative for dysuria.  Musculoskeletal: Negative.  Negative for back pain.  Skin: Negative.  Negative for rash.  Neurological: Negative.  Negative for focal weakness, weakness and headaches.  Psychiatric/Behavioral: Negative.  The patient is not nervous/anxious.     As per HPI. Otherwise, a complete review of systems is negative.  PAST MEDICAL HISTORY: Past Medical History:  Diagnosis Date  . Carotid artery occlusion   . Hyperlipidemia   . Hypocalcemia   . hypothyroidism 2007   acquired, secondary to surgery for thyroid Ca    . Migraine    improved since thyroid surgery, 2 years  . multinodular goiter 2007   s/p thyroidectomy  . 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 Repeat angiography in November of 2014 showed 50% distal aortic stenosis extending into the left common iliac artery which had 60-70% ostial stenosis. No other significant disease. I placed on balloon expandable stent into  left common iliac artery extending slightly into the distal aorta    PAST SURGICAL HISTORY: Past Surgical History:  Procedure Laterality Date  . ABDOMINAL AORTAGRAM N/A 10/14/2013   Procedure: ABDOMINAL [name];  Surgeon: Wellington Hampshire, MD;  Location: [place] CATH LAB;  Service: Cardiovascular;  Laterality: N/A;  . ANGIOPLASTY ILLIAC ARTERY    . CESAREAN SECTION    . NOSE SURGERY    . PERIPHERAL VASCULAR CATHETERIZATION N/A 11/14/2016   Procedure: Abdominal Aortogram;  Surgeon: Wellington Hampshire, MD;  Location: [place] CV LAB;  Service: Cardiovascular;  Laterality: N/A;  . PERIPHERAL VASCULAR CATHETERIZATION Bilateral 11/14/2016   Procedure: Lower Extremity Angiography;  Surgeon: Wellington Hampshire, MD;  Location: [place] CV LAB;  Service: Cardiovascular;  Laterality: Bilateral;  limited iliacs only  . PERIPHERAL VASCULAR CATHETERIZATION Left 11/14/2016   Procedure: Peripheral Vascular Intervention;  Surgeon: Wellington Hampshire, MD;  Location: [place] CV LAB;  Service: Cardiovascular;  Laterality: Left;  common iliac  . SKIN CANCER EXCISION    . THYROID SURGERY  2007   secondary to thyroid cancer    FAMILY HISTORY: Family History  Problem Relation Age of Onset  . Alzheimer's  disease Father   . Stroke Mother   . Coronary artery disease Maternal Grandmother   . Heart disease Maternal Grandmother 70  . Coronary artery disease Paternal Grandmother 50  . Heart disease Paternal Grandmother 72    ADVANCED  DIRECTIVES (Y/N):  N  HEALTH MAINTENANCE: Social History   Tobacco Use  . Smoking status: Current Every Day Smoker    Packs/day: 0.50    Years: 20.00    Pack years: 10.00    Types: Cigarettes    Last attempt to quit: 10/28/2011    Years since quitting: 6.7  . Smokeless tobacco: Never Used  Substance Use Topics  . Alcohol use: No  . Drug use: No     Colonoscopy:  PAP:  Bone density:  Lipid panel:  Allergies  Allergen Reactions  . Atorvastatin Other (See Comments)    Myalgias   . Darvocet [Propoxyphene N-Acetaminophen] Nausea And Vomiting  . Demerol Nausea And Vomiting  . Methocarbamol Other (See Comments)    UNKNOWN  . Mirtazapine     Sleeplessness, nausea   . Ambien [Zolpidem Tartrate] Palpitations    Anxiety , trembling.    Current Outpatient Medications  Medication Sig Dispense Refill  . ALPRAZolam (XANAX) 1 MG tablet TAKE 1 TABLET BY MOUTH AT BEDTIME AS NEEDED ANXIETY 30 tablet 5  . clopidogrel (PLAVIX) 75 MG tablet TAKE ONE (1) TABLET BY MOUTH EVERY DAY 30 tablet 2  . levothyroxine (SYNTHROID, LEVOTHROID) 125 MCG tablet Take 1 tablet (125 mcg total) by mouth daily before breakfast. 90 tablet 0  . Multiple Vitamin (MULTIVITAMIN WITH MINERALS) TABS tablet Take 1 tablet by mouth daily.    . rosuvastatin (CRESTOR) 40 MG tablet Take 1 tablet (40 mg total) by mouth daily. 90 tablet 3   No current facility-administered medications for this visit.     OBJECTIVE: Vitals:   07/14/18 0941 07/14/18 0952  BP:  121/74  Pulse:  81  Resp: 12      Body mass index is 17.21 kg/m.    ECOG FS:0 - Asymptomatic  General: Well-developed, well-nourished, no acute distress. Eyes: Pink conjunctiva, anicteric sclera. HEENT: Normocephalic, moist mucous membranes, clear oropharnyx. Lungs: Clear to auscultation bilaterally. Heart: Regular rate and rhythm. No rubs, murmurs, or gallops. Abdomen: Soft, nontender, nondistended. No organomegaly noted, normoactive bowel  sounds. Musculoskeletal: No edema, cyanosis, or clubbing. Neuro: Alert, answering all questions appropriately. Cranial nerves grossly intact. Skin: No rashes or petechiae noted. Psych: Normal affect. Lymphatics: No cervical, calvicular, axillary or inguinal LAD.  LAB RESULTS:  Lab Results  Component Value Date   NA 137 06/02/2018   K 3.7 06/02/2018   CL 100 06/02/2018   CO2 27 06/02/2018   GLUCOSE 152 (H) 06/02/2018   BUN 8 06/02/2018   CREATININE 0.77 06/02/2018   CALCIUM 9.5 06/02/2018   PROT 6.9 06/02/2018   ALBUMIN 4.0 06/02/2018   AST 60 (H) 06/02/2018   ALT 44 (H) 06/02/2018   ALKPHOS 129 (H) 06/02/2018   BILITOT 0.5 06/02/2018   GFRNONAA 94 11/05/2016   GFRAA 109 11/05/2016    Lab Results  Component Value Date   WBC 14.1 (H) 02/03/2018   NEUTROABS 9.6 (H) 02/03/2018   HGB 16.4 (H) 02/03/2018   HCT 48.4 (H) 02/03/2018   MCV 106.8 (H) 02/03/2018   PLT 273.0 02/03/2018   Lab Results  Component Value Date   IRON 279 (HH) 06/02/2018   TIBC 338 06/02/2018   IRONPCTSAT 83 (HH) 06/02/2018   Lab Results  Component Value  Date   FERRITIN 140 06/02/2018     STUDIES: No results found.  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  PLAN:    1. Hereditary hemochromatosis: Patient's most recent ferritin was 140 on June 02, 2018.  She received 500 mL phlebotomy at [facility] outpatient drawing center.  Patient states this amount of blood removal caused increased weakness and fatigue for 1 to 2 weeks.  She does not require additional phlebotomy at this time.  Patient will return to clinic in November 2019 for repeat laboratory work and consideration of additional phlebotomy here in the [place] with [facility].  She is on Plavix, therefore her phlebotomy donation cannot be recycled to the community.  She also will benefit from a decreased amount of blood removal.       I spent a total of 30 minutes face-to-face with the  patient of which greater than 50% of the visit was spent in counseling and coordination of care as detailed above.   Patient expressed understanding and was in agreement with this plan. She also understands that She can call clinic at any time with any questions, concerns, or complaints.    Lloyd Huger, MD   07/14/2018 9:52 PM

## 2018-07-14 ENCOUNTER — Encounter: Payer: Self-pay | Admitting: Oncology

## 2018-07-14 ENCOUNTER — Other Ambulatory Visit: Payer: Self-pay

## 2018-07-14 ENCOUNTER — Inpatient Hospital Stay: Payer: BC Managed Care – PPO | Attending: Oncology | Admitting: Oncology

## 2018-07-14 DIAGNOSIS — Z79899 Other long term (current) drug therapy: Secondary | ICD-10-CM | POA: Diagnosis not present

## 2018-07-14 DIAGNOSIS — E039 Hypothyroidism, unspecified: Secondary | ICD-10-CM | POA: Diagnosis not present

## 2018-07-14 DIAGNOSIS — E785 Hyperlipidemia, unspecified: Secondary | ICD-10-CM | POA: Diagnosis not present

## 2018-07-14 DIAGNOSIS — I739 Peripheral vascular disease, unspecified: Secondary | ICD-10-CM | POA: Insufficient documentation

## 2018-07-14 DIAGNOSIS — F17210 Nicotine dependence, cigarettes, uncomplicated: Secondary | ICD-10-CM | POA: Diagnosis not present

## 2018-07-14 DIAGNOSIS — R531 Weakness: Secondary | ICD-10-CM | POA: Diagnosis not present

## 2018-07-14 DIAGNOSIS — R5383 Other fatigue: Secondary | ICD-10-CM | POA: Diagnosis not present

## 2018-07-14 NOTE — Progress Notes (Signed)
Patient here for follow up. No changes since last appt. 

## 2018-07-19 ENCOUNTER — Other Ambulatory Visit: Payer: Self-pay | Admitting: Internal Medicine

## 2018-07-21 NOTE — Telephone Encounter (Signed)
Refilled: 02/04/2018 Last OV: 02/03/2018 Next OV: 08/18/2018

## 2018-08-11 ENCOUNTER — Ambulatory Visit: Payer: BC Managed Care – PPO | Admitting: Internal Medicine

## 2018-08-18 ENCOUNTER — Ambulatory Visit (INDEPENDENT_AMBULATORY_CARE_PROVIDER_SITE_OTHER): Payer: BC Managed Care – PPO | Admitting: Internal Medicine

## 2018-08-18 ENCOUNTER — Encounter: Payer: Self-pay | Admitting: Internal Medicine

## 2018-08-18 ENCOUNTER — Telehealth: Payer: Self-pay

## 2018-08-18 VITALS — BP 118/72 | HR 83 | Temp 98.4°F | Resp 15 | Ht 67.5 in | Wt 112.4 lb

## 2018-08-18 DIAGNOSIS — R251 Tremor, unspecified: Secondary | ICD-10-CM

## 2018-08-18 DIAGNOSIS — F411 Generalized anxiety disorder: Secondary | ICD-10-CM | POA: Insufficient documentation

## 2018-08-18 DIAGNOSIS — K760 Fatty (change of) liver, not elsewhere classified: Secondary | ICD-10-CM

## 2018-08-18 DIAGNOSIS — R748 Abnormal levels of other serum enzymes: Secondary | ICD-10-CM | POA: Diagnosis not present

## 2018-08-18 DIAGNOSIS — F430 Acute stress reaction: Secondary | ICD-10-CM

## 2018-08-18 DIAGNOSIS — G4700 Insomnia, unspecified: Secondary | ICD-10-CM

## 2018-08-18 DIAGNOSIS — B354 Tinea corporis: Secondary | ICD-10-CM

## 2018-08-18 DIAGNOSIS — Z23 Encounter for immunization: Secondary | ICD-10-CM | POA: Diagnosis not present

## 2018-08-18 DIAGNOSIS — E7800 Pure hypercholesterolemia, unspecified: Secondary | ICD-10-CM

## 2018-08-18 LAB — COMPREHENSIVE METABOLIC PANEL
ALBUMIN: 4 g/dL (ref 3.5–5.2)
ALT: 81 U/L — ABNORMAL HIGH (ref 0–35)
AST: 141 U/L — AB (ref 0–37)
Alkaline Phosphatase: 165 U/L — ABNORMAL HIGH (ref 39–117)
BUN: 6 mg/dL (ref 6–23)
CHLORIDE: 102 meq/L (ref 96–112)
CO2: 27 meq/L (ref 19–32)
Calcium: 9.1 mg/dL (ref 8.4–10.5)
Creatinine, Ser: 0.69 mg/dL (ref 0.40–1.20)
GFR: 91.29 mL/min (ref >60.00)
Glucose, Bld: 81 mg/dL (ref 70–99)
POTASSIUM: 4.1 meq/L (ref 3.5–5.1)
SODIUM: 137 meq/L (ref 135–145)
Total Bilirubin: 0.7 mg/dL (ref 0.2–1.2)
Total Protein: 6.7 g/dL (ref 6.0–8.3)

## 2018-08-18 LAB — GAMMA GT: GGT: 415 U/L — AB (ref 7–51)

## 2018-08-18 MED ORDER — TEMAZEPAM 7.5 MG PO CAPS: 7.5000 mg | ORAL_CAPSULE | Freq: Every evening | ORAL | 0 refills | Status: DC | PRN

## 2018-08-18 MED ORDER — TERBINAFINE HCL 1 % EX CREA: 1.0000 "application " | TOPICAL_CREAM | Freq: Two times a day (BID) | CUTANEOUS | 0 refills | Status: DC

## 2018-08-18 MED ORDER — ALPRAZOLAM 0.5 MG PO TABS: 0.5000 mg | ORAL_TABLET | Freq: Every day | ORAL | 5 refills | Status: DC | PRN

## 2018-08-18 MED ORDER — ATENOLOL 25 MG PO TABS: 25.0000 mg | ORAL_TABLET | Freq: Every day | ORAL | 3 refills | Status: DC

## 2018-08-18 NOTE — Assessment & Plan Note (Signed)
Suggested by last CT, with HH addressed and alcohol abuse ongoing .  LFTS have been elevated in the past. Repeat due   Lab Results  Component Value Date   ALT 44 (H) 06/02/2018   AST 60 (H) 06/02/2018   ALKPHOS 129 (H) 06/02/2018   BILITOT 0.5 06/02/2018

## 2018-08-18 NOTE — Assessment & Plan Note (Signed)
Continue 0.5 mg alprazolam once daily and restoril for insomnia  .  Did not tolerate lexapro (previously tolerated)  Due to dizziness

## 2018-08-18 NOTE — Assessment & Plan Note (Signed)
Trial of restoril starting at 7.5 mg daily

## 2018-08-18 NOTE — Patient Instructions (Addendum)
You are VERY OVERDUE for colon cancer screening. Your last ANNUAL FECAL TEST was In  2015   Check with your insurance about the cologuard test as a covered screening test for colon cancer.  This test,  If used correctly, will serve as a screening test every  3 years    You can continue using 0.5 mg  (full tablet of new lower dose prescribed today) during the day for anxiety  trial of Restoril for insomnia    Terbinafine  For ringworm  Bu your liver enzymes need to be normalized first, otherwise we will use an ointment

## 2018-08-18 NOTE — Assessment & Plan Note (Signed)
Did not tolerate trial of primidone.  Resume trial of atenolol 25 mg qhs

## 2018-08-18 NOTE — Telephone Encounter (Signed)
Copied from [place] [id number]. Topic: General - Call Back - No Documentation >> Aug 18, 2018  2:40 PM Mylinda Latina, NT wrote: Reason for CRM: Patient called and states she was seen by Dr. Derrel Nip today and she missed a call. No documentation in Epic. Please call patient CB# [phone] or [phone]

## 2018-08-18 NOTE — Assessment & Plan Note (Signed)
Will treat with topical terbinafine bid x 3 weeks.

## 2018-08-18 NOTE — Assessment & Plan Note (Signed)
Her most recent panel was normal and she had not been taking statin  So she has stopped it.  Will recheck in 3 months   Lab Results  Component Value Date   CHOL 183 06/02/2018   HDL 73.20 06/02/2018   LDLCALC 92 06/02/2018   TRIG 89.0 06/02/2018   CHOLHDL 2 06/02/2018

## 2018-08-18 NOTE — Progress Notes (Signed)
Patient ID: Tracie Morris, female    DOB: Apr 07, 1956  Age: 62 y.o. MRN: [id number]  The patient is here for CPE,  But CPE postponed due to  multiple chronic and acute problems.  CC: The primary encounter diagnosis was Elevated liver enzymes. Diagnoses of Need for influenza vaccination, Insomnia, persistent, Hepatic steatosis, Ringworm of body, Pure hypercholesterolemia, Anxiety as acute reaction to exceptional stress, and Tremor of right hand were also pertinent to this visit.   1) skin rash on anterior right thigh.  Present for over 3 weeks ,  Annular, raised ried border,  Slightly pruritic  Has been using some OTC cream which made it burn .  Thinks it's  ringworm   2) Intention tremor did not tolerate primidone trial .  Took 1/2 tablet at bedtime, had diarrhea , insomnia.   Tried another dose the t the next morning.  Went 62 hours without sleep.  Admits that since she stopped it she has been   "DRINKING TOO MUCH"  TO manage her tremor and her anxiety.  Thinks she has tried atenolol,  Also gabapentin  Does not want to see neurology .    3) Alcohol abuse: liver enzymes elevated 2 month ago   .  Family history of cirrhosis  Due to [facility]    3) seeing hematology for [facility]   4) anxiety:  Husband Orpah Greek has decided not to have dialysis for Stage 5 kidney disease and her  Current career is all consuming and work is not allowing her any time off.   She is handling everything at home.  Tearful today.    Tried restarting lexapro  At night,  Made her dizzy in the morning. .  Having trouble functioning during the day bc of tremor and anxiety   Finds tha t 1/2 alprazolam calms her down during day . Piror trilaof remeron not tolerated either.  5) Hyperlipidemia:  Last lipid panel showd that her cholesterol was normal without meds   History Baya has a past medical history of Carotid artery occlusion, Hyperlipidemia, Hypocalcemia, hypothyroidism (2007), Migraine, multinodular goiter (2007), and Peripheral arterial  disease ([place]).   She has a past surgical history that includes Thyroid surgery (2007); Angioplasty illiac artery; Cesarean section; Nose surgery; Skin cancer excision; abdominal aortagram (N/A, 10/14/2013); Cardiac catheterization (N/A, 11/14/2016); Cardiac catheterization (Bilateral, 11/14/2016); and Cardiac catheterization (Left, 11/14/2016).   Her family history includes Alzheimer's disease in her father; Coronary artery disease in her maternal grandmother; Coronary artery disease (age of onset: 34) in her paternal grandmother; Heart disease (age of onset: 27) in her maternal grandmother; Heart disease (age of onset: 40) in her paternal grandmother; Stroke in her mother.She reports that she has been smoking cigarettes. She has a 10.00 pack-year smoking history. She has never used smokeless tobacco. She reports that she does not drink alcohol or use drugs.  Outpatient Medications Prior to Visit  Medication Sig Dispense Refill  . clopidogrel (PLAVIX) 75 MG tablet TAKE ONE (1) TABLET BY MOUTH EVERY DAY 30 tablet 2  . levothyroxine (SYNTHROID, LEVOTHROID) 125 MCG tablet Take 1 tablet (125 mcg total) by mouth daily before breakfast. 90 tablet 0  . Multiple Vitamin (MULTIVITAMIN WITH MINERALS) TABS tablet Take 1 tablet by mouth daily.    Marland Kitchen PICATO 0.05 % GEL     . TOLAK 4 % CREA     . ALPRAZolam (XANAX) 1 MG tablet TAKE 1 TABLET BY MOUTH AT BEDTIME AS NEEDED ANXIETY 30 tablet 2  . rosuvastatin (CRESTOR) 40  MG tablet Take 1 tablet (40 mg total) by mouth daily. (Patient not taking: Reported on 08/18/2018) 90 tablet 3   No facility-administered medications prior to visit.     Review of Systems   Patient denies headache, fevers, malaise, unintentional weight loss, skin rash, eye pain, sinus congestion and sinus pain, sore throat, dysphagia,  hemoptysis , cough, dyspnea, wheezing, chest pain, palpitations, orthopnea, edema, abdominal pain, nausea, melena, diarrhea, constipation, flank pain, dysuria,  hematuria, urinary  Frequency, nocturia, numbness, tingling, seizures,  Focal weakness, Loss of consciousness,  Tremor, insomnia, depression, anxiety, and suicidal ideation.      Objective:  BP 118/72 (BP Location: Left Arm, Patient Position: Sitting, Cuff Size: Normal)   Pulse 83   Temp 98.4 F (36.9 C) (Oral)   Resp 15   Ht 5' 7.5" (1.715 m)   Wt 112 lb 6.4 oz (51 kg)   SpO2 97%   BMI 17.34 kg/m   Physical Exam   General appearance: alert, cooperative and appears stated age Ears: normal TM's and external ear canals both ears Throat: lips, mucosa, and tongue normal; teeth and gums normal Neck: no adenopathy, no carotid bruit, supple, symmetrical, trachea midline and thyroid not enlarged, symmetric, no tenderness/mass/nodules Back: symmetric, no curvature. ROM normal. No CVA tenderness. Lungs: clear to auscultation bilaterally Heart: regular rate and rhythm, S1, S2 normal, no murmur, click, rub or gallop Abdomen: soft, non-tender; bowel sounds normal; no masses,  no organomegaly Pulses: 2+ and symmetric Skin: half dollar sized annualr lesion with red raised border, central pallor, eight anterior thigh  Lymph nodes: Cervical, supraclavicular, and axillary nodes normal. Neuro: CNs 2-12 intact. DTRs 2+/4 in biceps, brachioradialis, patellars and achilles. Muscle strength 5/5 in upper and lower exremities. noarse intention tremor right hand.  Psych: affect anxious, makes good eye contact. No fidgeting,  Tearful .  Denies suicidal thoughts     Assessment & Plan:   Problem List Items Addressed This Visit    Anxiety as acute reaction to exceptional stress    Continue 0.5 mg alprazolam once daily and restoril for insomnia  .  Did not tolerate lexapro (previously tolerated)  Due to dizziness      Relevant Medications   ALPRAZolam (XANAX) 0.5 MG tablet   Hepatic steatosis    Suggested by last CT, with HH addressed and alcohol abuse ongoing .  LFTS have been elevated in the past.  Repeat due   Lab Results  Component Value Date   ALT 44 (H) 06/02/2018   AST 60 (H) 06/02/2018   ALKPHOS 129 (H) 06/02/2018   BILITOT 0.5 06/02/2018         Hyperlipidemia    Her most recent panel was normal and she had not been taking statin  So she has stopped it.  Will recheck in 3 months   Lab Results  Component Value Date   CHOL 183 06/02/2018   HDL 73.20 06/02/2018   LDLCALC 92 06/02/2018   TRIG 89.0 06/02/2018   CHOLHDL 2 06/02/2018         Relevant Medications   atenolol (TENORMIN) 25 MG tablet   Insomnia, persistent    Trial of restoril starting at 7.5 mg daily       Ringworm of body    Will treat with topical terbinafine bid x 3 weeks.       Relevant Medications   terbinafine (LAMISIL) 1 % cream   Tremor of right hand    Did not tolerate trial of primidone.  Resume  trial of atenolol 25 mg qhs       Other Visit Diagnoses    Elevated liver enzymes    -  Primary   Relevant Orders   Comprehensive metabolic panel   Gamma GT   Need for influenza vaccination       Relevant Orders   Flu Vaccine QUAD 6+ mos PF IM (Fluarix Quad PF) (Completed)      I have changed Len Blalock "Tracie Morris"'s ALPRAZolam. I am also having her start on atenolol, temazepam, and terbinafine. Additionally, I am having her maintain her multivitamin with minerals, rosuvastatin, clopidogrel, levothyroxine, PICATO, and TOLAK.  Meds ordered this encounter  Medications  . atenolol (TENORMIN) 25 MG tablet    Sig: Take 1 tablet (25 mg total) by mouth daily.    Dispense:  90 tablet    Refill:  3  . temazepam (RESTORIL) 7.5 MG capsule    Sig: Take 1 capsule (7.5 mg total) by mouth at bedtime as needed for sleep.    Dispense:  30 capsule    Refill:  0  . ALPRAZolam (XANAX) 0.5 MG tablet    Sig: Take 1 tablet (0.5 mg total) by mouth daily as needed for anxiety.    Dispense:  30 tablet    Refill:  5  . terbinafine (LAMISIL) 1 % cream    Sig: Apply 1 application topically 2  (two) times daily.    Dispense:  30 g    Refill:  0   A total of greater than 25 minutes of face to face time was spent with patient more than half of which was spent in counselling about the above mentioned conditions  and coordination of care   Medications Discontinued During This Encounter  Medication Reason  . ALPRAZolam (XANAX) 1 MG tablet Reorder    Follow-up: Return in about 3 months (around 11/17/2018).   [name], MD

## 2018-08-19 ENCOUNTER — Telehealth: Payer: Self-pay

## 2018-08-19 ENCOUNTER — Other Ambulatory Visit: Payer: Self-pay | Admitting: Internal Medicine

## 2018-08-19 DIAGNOSIS — K760 Fatty (change of) liver, not elsewhere classified: Secondary | ICD-10-CM

## 2018-08-19 DIAGNOSIS — R748 Abnormal levels of other serum enzymes: Secondary | ICD-10-CM

## 2018-08-19 NOTE — Telephone Encounter (Signed)
Not sure who called the pt but neither Dr. Derrel Nip or I did. Don't see any documentation in the chart.

## 2018-08-19 NOTE — Telephone Encounter (Signed)
Copied from [place] [id number]. Topic: General - Other >> Aug 19, 2018  2:30 PM Janace Aris A wrote: Sam from [facility] called in requesting another medication be sent in to the pharmacy, he says the medication temazepam (RESTORIL) 7.5 MG capsule will not be covered by the patient's insurance.   Please advise

## 2018-08-20 ENCOUNTER — Ambulatory Visit: Payer: BC Managed Care – PPO | Admitting: Gastroenterology

## 2018-08-20 ENCOUNTER — Encounter: Payer: Self-pay | Admitting: Gastroenterology

## 2018-08-20 VITALS — BP 96/58 | HR 67 | Ht 67.5 in | Wt 110.2 lb

## 2018-08-20 DIAGNOSIS — R748 Abnormal levels of other serum enzymes: Secondary | ICD-10-CM | POA: Diagnosis not present

## 2018-08-20 MED ORDER — RAMELTEON 8 MG PO TABS: 8.0000 mg | ORAL_TABLET | Freq: Every day | ORAL | 5 refills | Status: DC

## 2018-08-20 NOTE — Patient Instructions (Signed)
F/U in 6 months Ultrasound scheduled by Dr. Derrel Nip for 9/30, please call [phone] to reschedule if needed. Please return to office for lab work or go to your nearest [employer] drawing station.  Fatty Liver Fatty liver, also called hepatic steatosis or steatohepatitis, is a condition in which too much fat has built up in your liver cells. The liver removes harmful substances from your bloodstream. It produces fluids your body needs. It also helps your body use and store energy from the food you eat. In many cases, fatty liver does not cause symptoms or problems. It is often diagnosed when tests are being done for other reasons. However, over time, fatty liver can cause inflammation that may lead to more serious liver problems, such as scarring of the liver (cirrhosis). What are the causes? Causes of fatty liver may include:  Drinking too much alcohol.  Poor nutrition.  Obesity.  Cushing syndrome.  Diabetes.  Hyperlipidemia.  Pregnancy.  Certain drugs.  Poisons.  Some viral infections.  What increases the risk? You may be more likely to develop fatty liver if you:  Abuse alcohol.  Are pregnant.  Are overweight.  Have diabetes.  Have hepatitis.  Have a high triglyceride level.  What are the signs or symptoms? Fatty liver often does not cause any symptoms. In cases where symptoms develop, they can include:  Fatigue.  Weakness.  Weight loss.  Confusion.  Abdominal pain.  Yellowing of your skin and the white parts of your eyes (jaundice).  Nausea and vomiting.  How is this diagnosed? Fatty liver may be diagnosed by:  Physical exam and medical history.  Blood tests.  Imaging tests, such as an ultrasound, CT scan, or MRI.  Liver biopsy. A small sample of liver tissue is removed using a needle. The sample is then looked at under a microscope.  How is this treated? Fatty liver is often caused by other health conditions. Treatment for fatty liver may  involve medicines and lifestyle changes to manage conditions such as:  Alcoholism.  High cholesterol.  Diabetes.  Being overweight or obese.  Follow these instructions at home:  Eat a healthy diet as directed by your health care provider.  Exercise regularly. This can help you lose weight and control your cholesterol and diabetes. Talk to your health care provider about an exercise plan and which activities are best for you.  Do not drink alcohol.  Take medicines only as directed by your health care provider. Contact a health care provider if: You have difficulty controlling your:  Blood sugar.  Cholesterol.  Alcohol consumption.  Get help right away if:  You have abdominal pain.  You have jaundice.  You have nausea and vomiting. This information is not intended to replace advice given to you by your health care provider. Make sure you discuss any questions you have with your health care provider. Document Released: 12/28/2005 Document Revised: 04/19/2016 Document Reviewed: 03/24/2014 Elsevier Interactive Patient Education  [employer].

## 2018-08-20 NOTE — Telephone Encounter (Signed)
Spoke with pt to let her know that the Dr. Derrel Nip sent in a medication called rozerem to [facility] in place of the Temazepam. Pt stated that the pharmacy called her today and stated that she would have a $5 copay for the rozerem.

## 2018-08-20 NOTE — Progress Notes (Signed)
Tracie Morris [address]  [facility] [place]  [place], [place] [address]  Main: [phone]  Fax: [phone]   Gastroenterology Consultation  Referring Provider:     [name], MD Primary Care Physician:  [name], MD Primary Gastroenterologist:  Dr. Vonda Morris Reason for Consultation:     Elevated liver enzymes        HPI:    Chief Complaint  Patient presents with  . New Patient (Initial Visit)    referral hepatic steatosis, hereditary hemochromatosis, elevated liver enzymes    Tracie Morris is a 62 y.o. y/o female referred for consultation & management  by Dr. [name], MD.  62 year old female followed by Dr. [name] for history of hereditary hemochromatosis, with family history of the same, receiving phlebotomy by hematology, with GI consult placed for newly elevated liver enzymes.  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  Ferritin highest at 158 since diagnosis.  Patient reports fatigue prior to initiating phlebotomy, and felt significantly improved after her first phlebotomy.  Denies any history of arthropathy, brown skin, diabetes.  Does have hypothyroidism.  Latest ferritin being 142 months ago.  States had some weakness after last phlebotomy so oncology is altering phlebotomy schedule to avoid this.  Has also started drinking alcohol on a daily basis due to a hand tremor that she states is not relieved with medications.  Follows with her primary care provider endocrinology for this.  States also drinks due to stressors including husband with stage V kidney disease.  No suicidal thoughts or attempts.  No episodes of confusion or bleeding.  No prior EGD or colonoscopy.  Past Medical History:  Diagnosis Date  . Carotid artery occlusion   . Hyperlipidemia   . Hypocalcemia   . hypothyroidism 2007   acquired, secondary to surgery for  thyroid Ca  . Migraine    improved since thyroid surgery, 2 years  . multinodular goiter 2007   s/p thyroidectomy  . 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 Repeat angiography in November of 2014 showed 50% distal aortic stenosis extending into the left common iliac artery which had 60-70% ostial stenosis. No other significant disease. I placed on balloon expandable stent into  left common iliac artery extending slightly into the distal aorta    Past Surgical History:  Procedure Laterality Date  . ABDOMINAL AORTAGRAM N/A 10/14/2013   Procedure: ABDOMINAL [name];  Surgeon: Wellington Hampshire, MD;  Location: [facility];  Service: Cardiovascular;  Laterality: N/A;  . ANGIOPLASTY ILLIAC ARTERY    . CESAREAN SECTION    . NOSE SURGERY    . PERIPHERAL VASCULAR CATHETERIZATION N/A 11/14/2016   Procedure: Abdominal Aortogram;  Surgeon: Wellington Hampshire, MD;  Location: [facility];  Service: Cardiovascular;  Laterality: N/A;  . PERIPHERAL VASCULAR CATHETERIZATION Bilateral 11/14/2016   Procedure: Lower Extremity Angiography;  Surgeon: Wellington Hampshire, MD;  Location: [facility];  Service: Cardiovascular;  Laterality: Bilateral;  limited iliacs only  . PERIPHERAL VASCULAR CATHETERIZATION Left 11/14/2016   Procedure: Peripheral Vascular Intervention;  Surgeon: Wellington Hampshire, MD;  Location: [facility];  Service: Cardiovascular;  Laterality: Left;  common iliac  . SKIN CANCER EXCISION    . THYROID SURGERY  2007   secondary to thyroid cancer    Prior to  Admission medications   Medication Sig Start Date End Date Taking? Authorizing Provider  ALPRAZolam Duanne Moron) 0.5 MG tablet Take 1 tablet (0.5 mg total) by mouth daily as needed for anxiety. 08/18/18   [name], MD  atenolol (TENORMIN) 25 MG tablet Take 1 tablet (25 mg total) by mouth daily. 08/18/18   [name], MD  clopidogrel (PLAVIX) 75 MG tablet TAKE ONE (1) TABLET BY MOUTH EVERY DAY 10/04/17   Wellington Hampshire, MD  levothyroxine (SYNTHROID, LEVOTHROID) 125 MCG tablet Take 1 tablet (125 mcg total) by mouth daily before breakfast. 06/05/18   [name], MD  Multiple Vitamin (MULTIVITAMIN WITH MINERALS) TABS tablet Take 1 tablet by mouth daily.    [provider]  PICATO 0.05 % GEL  08/05/18   [provider]  ramelteon (ROZEREM) 8 MG tablet Take 1 tablet (8 mg total) by mouth at bedtime. 08/20/18   [name], MD  rosuvastatin (CRESTOR) 40 MG tablet Take 1 tablet (40 mg total) by mouth daily. Patient not taking: Reported on 08/18/2018 01/01/17   [name], MD  terbinafine (LAMISIL) 1 % cream Apply 1 application topically 2 (two) times daily. 08/18/18   [name], MD  TOLAK 4 % CREA  08/04/18   [provider]    Family History  Problem Relation Age of Onset  . Alzheimer's disease Father   . Stroke Mother   . Coronary artery disease Maternal Grandmother   . Heart disease Maternal Grandmother 70  . Coronary artery disease Paternal Grandmother 62  . Heart disease Paternal Grandmother 64     Social History   Tobacco Use  . Smoking status: Current Every Day Smoker    Packs/day: 0.50    Years: 20.00    Pack years: 10.00    Types: Cigarettes    Last attempt to quit: 10/28/2011    Years since quitting: 6.8  . Smokeless tobacco: Never Used  Substance Use Topics  . Alcohol use: No  . Drug use: No    Allergies as of 08/20/2018 - Review Complete 08/20/2018  Allergen Reaction Noted  . Atorvastatin Other (See Comments) 01/31/2015  . Darvocet [propoxyphene n-acetaminophen] Nausea And Vomiting 10/19/2011  . Demerol Nausea And Vomiting 10/19/2011  . Methocarbamol Other (See Comments) 01/31/2015  . Mirtazapine  04/09/2016  . Primidone  08/18/2018  . Ambien [zolpidem tartrate] Palpitations 01/31/2015    Review of Systems:    All systems reviewed and  negative except where noted in HPI.   Physical Exam:  BP (!) 88/55   Pulse 67   Ht 5' 7.5" (1.715 m)   Wt 110 lb 3.2 oz (50 kg)   BMI 17.01 kg/m  No LMP recorded. Patient is postmenopausal. Psych:  Alert and cooperative. Normal mood and affect. General:   Alert,  Well-developed, well-nourished, pleasant and cooperative in NAD Head:  Normocephalic and atraumatic. Eyes:  Sclera clear, no icterus.   Conjunctiva pink. Ears:  Normal auditory acuity. Nose:  No deformity, discharge, or lesions. Mouth:  No deformity or lesions,oropharynx pink & moist. Neck:  Supple; no masses or thyromegaly. Abdomen:  Normal bowel sounds.  No bruits.  Soft, non-tender and non-distended without masses, hepatosplenomegaly or hernias noted.  No guarding or rebound tenderness.    Msk:  Symmetrical without gross deformities. Good, equal movement & strength bilaterally. Pulses:  Normal pulses noted. Extremities:  No clubbing or edema.  No cyanosis. Neurologic:  Alert and oriented x3;  grossly normal neurologically.  Skin:  Intact without significant lesions or rashes. No jaundice. Lymph Nodes:  No significant cervical adenopathy. Psych:  Alert and cooperative. Normal mood and affect.   Labs: CBC    Component Value Date/Time   WBC 14.1 (H) 02/03/2018 0846   RBC 4.53 02/03/2018 0846   HGB 16.4 (H) 02/03/2018 0846   HGB 16.6 (H) 11/05/2016 1611   HCT 48.4 (H) 02/03/2018 0846   HCT 48.1 (H) 11/05/2016 1611   PLT 273.0 02/03/2018 0846   PLT 251 11/05/2016 1611   MCV 106.8 (H) 02/03/2018 0846   MCV 101 (H) 11/05/2016 1611   MCV 95 02/17/2015 1505   MCH 36.1 (H) 07/15/2017 1305   MCHC 33.9 02/03/2018 0846   RDW 13.0 02/03/2018 0846   RDW 12.9 11/05/2016 1611   RDW 13.1 02/17/2015 1505   LYMPHSABS 3.4 02/03/2018 0846   LYMPHSABS 3.7 (H) 10/05/2013 1232   MONOABS 0.9 02/03/2018 0846   EOSABS 0.2 02/03/2018 0846   EOSABS 0.2 10/05/2013 1232   BASOSABS 0.1 02/03/2018 0846   BASOSABS 0.1 10/05/2013 1232    CMP     Component Value Date/Time   NA 137 08/18/2018 1117   NA 143 11/05/2016 1611   NA 137 02/17/2015 1505   K 4.1 08/18/2018 1117   K 3.7 02/17/2015 1505   CL 102 08/18/2018 1117   CL 105 02/17/2015 1505   CO2 27 08/18/2018 1117   CO2 23 02/17/2015 1505   GLUCOSE 81 08/18/2018 1117   GLUCOSE 224 (H) 02/17/2015 1505   BUN 6 08/18/2018 1117   BUN 13 11/05/2016 1611   BUN 20 02/17/2015 1505   CREATININE 0.69 08/18/2018 1117   CREATININE 0.97 02/17/2015 1505   CREATININE 0.84 05/16/2012 0859   CALCIUM 9.1 08/18/2018 1117   CALCIUM 9.5 02/17/2015 1505   PROT 6.7 08/18/2018 1117   ALBUMIN 4.0 08/18/2018 1117   AST 141 (H) 08/18/2018 1117   ALT 81 (H) 08/18/2018 1117   ALKPHOS 165 (H) 08/18/2018 1117   BILITOT 0.7 08/18/2018 1117   GFRNONAA 94 11/05/2016 1611   GFRNONAA >60 02/17/2015 1505   GFRNONAA 78 05/16/2012 0859   GFRAA 109 11/05/2016 1611   GFRAA >60 02/17/2015 1505   GFRAA >89 05/16/2012 0859    Imaging Studies: No results found.  Assessment and Plan:   Alexus Galka is a 62 y.o. y/o female has been referred for elevated liver enzymes and history of hereditary hemochromatosis with compound heterozygosity for C282Y and H63D mutations followed by hematology for phlebotomy  Patient's liver enzymes were previously normal and began increasing 2 months ago, with current alk phos 165, AST 141, ALT 81.  Normal bilirubin and albumin.  This is due to her alcohol use Patient was advised to abstain from alcohol use, and risk of progression to cirrhosis especially with her underlying hemochromatosis we discussed in detail  She was advised to avoid all hepatotoxic drugs including over-the-counter medications, herbal products and supplements, and green tea Denies any heavy Tylenol use  February 2018 labs show negative hep A total antibody and hep B surface antibody.  Hep C antibody was negative 2 years ago. I do not see that she was ever immunized for hepatitis A or  B.  We will order blood work to rule out autoimmune hepatitis, viral hepatitis If negative, patient can get hepatitis A and B vaccination by primary care provider  Right upper quadrant ultrasound already scheduled to evaluate the liver  Continue close follow-up with hematology, primary care provider  and endocrinology  No clinical evidence of cirrhosis otherwise  Patient does not want a screening colonoscopy and states she is having stool test done and has a Cologuard ordered by her primary care provider that she plans in getting done.  Dr Tracie Morris

## 2018-08-20 NOTE — Addendum Note (Signed)
Addended by: [name] on: 08/20/2018 10:57 AM   Modules accepted: Orders

## 2018-08-20 NOTE — Telephone Encounter (Signed)
rozerem sent.  If this is not covered,  Patient will need to  Bring [place] her formulary because I cannot tell what is covered and what is not

## 2018-08-25 ENCOUNTER — Ambulatory Visit
Admission: RE | Admit: 2018-08-25 | Discharge: 2018-08-25 | Disposition: A | Discharge status: Home / Self Care | Payer: BC Managed Care – PPO | Source: Ambulatory Visit | Attending: Internal Medicine | Admitting: Internal Medicine

## 2018-08-25 DIAGNOSIS — K760 Fatty (change of) liver, not elsewhere classified: Secondary | ICD-10-CM

## 2018-08-25 DIAGNOSIS — R932 Abnormal findings on diagnostic imaging of liver and biliary tract: Secondary | ICD-10-CM | POA: Insufficient documentation

## 2018-08-25 DIAGNOSIS — K828 Other specified diseases of gallbladder: Secondary | ICD-10-CM | POA: Insufficient documentation

## 2018-08-25 DIAGNOSIS — K838 Other specified diseases of biliary tract: Secondary | ICD-10-CM | POA: Insufficient documentation

## 2018-08-25 DIAGNOSIS — R748 Abnormal levels of other serum enzymes: Secondary | ICD-10-CM

## 2018-09-02 ENCOUNTER — Telehealth: Payer: Self-pay | Admitting: Internal Medicine

## 2018-09-02 DIAGNOSIS — R251 Tremor, unspecified: Secondary | ICD-10-CM

## 2018-09-02 NOTE — Telephone Encounter (Signed)
Copied from [place] [id number]. Topic: Quick Communication - See Telephone Encounter >> Sep 02, 2018 12:32 PM Tracie Morris wrote: CRM for notification. See Telephone encounter for: 09/02/18.  Patient states that ramelteon (ROZEREM) 8 MG tablet does not work for her. She said that she is groggy after she takes this. Also she said she has liver disease and it says " please do not take this " if you have liver disease. She said she was taking ALPRAZolam (XANAX) 0.5 MG tablet with no problem. Patient would like to speak with a nurse. Thanks.

## 2018-09-02 NOTE — Telephone Encounter (Signed)
rx change request

## 2018-09-03 NOTE — Telephone Encounter (Signed)
Patient is requesting a call back, she would like to know what she needs to do? Please contact patient back   [phone]

## 2018-09-03 NOTE — Telephone Encounter (Signed)
LMTCB. Please transfer pt to our office.  

## 2018-09-04 MED ORDER — ALPRAZOLAM 1 MG PO TABS: 1.0000 mg | ORAL_TABLET | Freq: Every evening | ORAL | 5 refills | Status: DC | PRN

## 2018-09-04 NOTE — Telephone Encounter (Signed)
Referral to [facility] made  Alprazolam refilled at 1 mg dose but needs 3 month follow p to discuss other options

## 2018-09-04 NOTE — Telephone Encounter (Signed)
Spoke with pt and she said to let you know that the cologuard is not covered by her insurance company and she can not afford it out of pocket.  Pt also wanted to see about increasing her Alprazolam back to 28m daily because the Rozarem is not working for her. She stated that she goes to sleep after taking it but wakes up around 3 or 4 in the morning not able to go back to sleep and then she stated that she feels groggy. Pt stated that she is also concerned about taking it because it says not to take if you have liver disease and the pt stated that she does.   Also the pt has essential hand tremors and has tried several medications with no relief. The pt did some research and has found a new treatment that can be done but is only [facility] The pt stated that it is a surgery, same day, non invasive. The procedure is called Neuravive Treatment. She is [facility] to know [facility] The pt is hoping to be able to get this done before the end of the year because she has met her out of pocket deductible for the year.

## 2018-09-04 NOTE — Telephone Encounter (Signed)
Pt has been informed of the referral to [facility] and the medication change that has been sent in to pharmacy.

## 2018-09-18 NOTE — Telephone Encounter (Signed)
Pt stated that the [facility] is not accepting new pt's at this time. Pt is wanting to know if a referral could be put in for Dr. Bedelia Person at [facility].

## 2018-09-18 NOTE — Telephone Encounter (Signed)
Pt states that [facility] is not accepting new patients at this time as it is scheduled too far out. Pt is requesting a call back to discuss another referral for Dr. Bedelia Person at [facility] instead. Please advise.

## 2018-09-18 NOTE — Telephone Encounter (Signed)
Neurology referral  For treatment of tremor changed from  [facility]  to [facility] per patient request

## 2018-09-19 NOTE — Telephone Encounter (Signed)
Submitted to [facility] for Dr. [name] through care link

## 2018-09-23 ENCOUNTER — Encounter: Payer: Self-pay | Admitting: Cardiovascular Disease

## 2018-09-23 ENCOUNTER — Ambulatory Visit: Payer: BC Managed Care – PPO | Admitting: Cardiovascular Disease

## 2018-09-23 VITALS — BP 110/70 | HR 83 | Ht 67.5 in | Wt 110.1 lb

## 2018-09-23 DIAGNOSIS — E785 Hyperlipidemia, unspecified: Secondary | ICD-10-CM

## 2018-09-23 DIAGNOSIS — Z720 Tobacco use: Secondary | ICD-10-CM | POA: Diagnosis not present

## 2018-09-23 DIAGNOSIS — I739 Peripheral vascular disease, unspecified: Secondary | ICD-10-CM | POA: Diagnosis not present

## 2018-09-23 DIAGNOSIS — I779 Disorder of arteries and arterioles, unspecified: Secondary | ICD-10-CM

## 2018-09-23 MED ORDER — CLOPIDOGREL BISULFATE 75 MG PO TABS: ORAL_TABLET | ORAL | 1 refills | Status: DC

## 2018-09-23 MED ORDER — ROSUVASTATIN CALCIUM 10 MG PO TABS: 10.0000 mg | ORAL_TABLET | Freq: Every day | ORAL | 1 refills | Status: DC

## 2018-09-23 NOTE — Patient Instructions (Signed)
Medication Instructions:  DECREASE the Rosuvastatin to 10 mg daily  If you need a refill on your cardiac medications before your next appointment, please call your pharmacy.   Lab work: None ordered  Testing/Procedures: None ordered  Follow-Up: At [facility], you and your health needs are our priority.  As part of our continuing mission to provide you with exceptional heart care, we have created designated Provider Care Teams.  These Care Teams include your primary Cardiologist (physician) and Advanced Practice Providers (APPs -  Physician Assistants and Nurse Practitioners) who all work together to provide you with the care you need, when you need it. You will need a follow up appointment in 6 months.  Please call our office 2 months in advance to schedule this appointment.  You may see Dr. Fletcher Anon or one of the following Advanced Practice Providers on your designated Care Team:   Lemaster Hodgkins, NP Christell Faith, PA-C . Marrianne Mood, PA-C

## 2018-09-23 NOTE — Progress Notes (Signed)
Cardiology Office Note   Date:  09/23/2018   ID:  Tracie Morris, DOB 08/01/1956, MRN [id number]  PCP:  [name], MD  Cardiologist:   [name], MD   Chief Complaint  Patient presents with  . other    f/u....no complaints...reviewed meds verbally with pt      History of Present Illness: Tracie Morris is a 62 y.o. female who presents for a followup visit regarding peripheral arterial disease.  She is status post left common iliac artery stent placement in November of 2014 for significant claudication with subsequent covered stent placement in 2017 for in situ thrombosis with subtotal occlusion. She was diagnosed with hereditary hemochromatosis site doses which might have been the trigger for very late stent thrombosis.  Other Medical history include  tobacco use and hyperlipidemia   Previous CT scan of the chest did show calcifications in the coronary arteries.  She has been doing well with no recent chest pain or shortness of breath.  No  leg claudication.   She continues to smoke.  She was prescribed Chantix but could not quit.  Past Medical History:  Diagnosis Date  . Carotid artery occlusion   . Hyperlipidemia   . Hypocalcemia   . hypothyroidism 2007   acquired, secondary to surgery for thyroid Ca  . Migraine    improved since thyroid surgery, 2 years  . multinodular goiter 2007   s/p thyroidectomy  . 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 Repeat angiography in November of 2014 showed 50% distal aortic stenosis extending into the left common iliac artery which had 60-70% ostial stenosis. No other significant disease. I placed on balloon expandable stent into  left common iliac artery extending slightly into the distal aorta    Past Surgical History:  Procedure Laterality Date  . ABDOMINAL AORTAGRAM N/A 10/14/2013   Procedure: ABDOMINAL  [name];  Surgeon: Wellington Hampshire, MD;  Location: [facility];  Service: Cardiovascular;  Laterality: N/A;  . ANGIOPLASTY ILLIAC ARTERY    . CESAREAN SECTION    . NOSE SURGERY    . PERIPHERAL VASCULAR CATHETERIZATION N/A 11/14/2016   Procedure: Abdominal Aortogram;  Surgeon: Wellington Hampshire, MD;  Location: [facility];  Service: Cardiovascular;  Laterality: N/A;  . PERIPHERAL VASCULAR CATHETERIZATION Bilateral 11/14/2016   Procedure: Lower Extremity Angiography;  Surgeon: Wellington Hampshire, MD;  Location: [facility];  Service: Cardiovascular;  Laterality: Bilateral;  limited iliacs only  . PERIPHERAL VASCULAR CATHETERIZATION Left 11/14/2016   Procedure: Peripheral Vascular Intervention;  Surgeon: Wellington Hampshire, MD;  Location: [facility];  Service: Cardiovascular;  Laterality: Left;  common iliac  . SKIN CANCER EXCISION    . THYROID SURGERY  2007   secondary to thyroid cancer     Current Outpatient Medications  Medication Sig Dispense Refill  . ALPRAZolam (XANAX) 1 MG tablet Take 1 tablet (1 mg total) by mouth at bedtime as needed for anxiety. 30 tablet 5  . clopidogrel (PLAVIX) 75 MG tablet TAKE ONE (1) TABLET BY MOUTH EVERY DAY 30 tablet 2  . levothyroxine (SYNTHROID, LEVOTHROID) 112 MCG tablet Take 112 mcg by mouth daily before breakfast.    . Multiple Vitamin (MULTIVITAMIN WITH MINERALS) TABS tablet Take 1 tablet by mouth daily.    Marland Kitchen PICATO 0.05 % GEL     . rosuvastatin (CRESTOR) 40 MG tablet Take  1 tablet (40 mg total) by mouth daily. 90 tablet 3  . terbinafine (LAMISIL) 1 % cream Apply 1 application topically 2 (two) times daily. 30 g 0  . TOLAK 4 % CREA      No current facility-administered medications for this visit.     Allergies:   Atorvastatin; Darvocet [propoxyphene n-acetaminophen]; Demerol; Methocarbamol; Mirtazapine; Primidone; and Ambien [zolpidem tartrate]    Social History:  The patient  reports that she has been smoking cigarettes. She  has a 10.00 pack-year smoking history. She has never used smokeless tobacco. She reports that she does not drink alcohol or use drugs.   Family History:  The patient's family history includes Alzheimer's disease in her father; Coronary artery disease in her maternal grandmother; Coronary artery disease (age of onset: 11) in her paternal grandmother; Heart disease (age of onset: 8) in her maternal grandmother; Heart disease (age of onset: 42) in her paternal grandmother; Stroke in her mother.    ROS:  Please see the history of present illness.   Otherwise, review of systems are positive for none.   All other systems are reviewed and negative.    PHYSICAL EXAM: VS:  BP 110/70   Pulse 83   Ht 5' 7.5" (1.715 m)   Wt 110 lb 1.9 oz (50 kg)   SpO2 98%   BMI 16.99 kg/m  , BMI Body mass index is 16.99 kg/m. GEN: Well nourished, well developed, in no acute distress  HEENT: normal  Neck: no JVD, carotid bruits, or masses Cardiac: RRR; no murmurs, rubs, or gallops,no edema  Respiratory:  clear to auscultation bilaterally, normal work of breathing GI: soft, nontender, nondistended, + BS MS: no deformity or atrophy  Skin: warm and dry, no rash Neuro:  Strength and sensation are intact Psych: euthymic mood, full affect Vascular: Femoral pulse: Normal bilaterally . Posterior tibial is normal bilaterally. Dorsalis pedis is normal on the right side and +1 on the left side. No ulceration  EKG:  EKG is  ordered today. EKG showed normal sinus rhythm with no significant ST or T wave changes.   Recent Labs: 02/03/2018: Hemoglobin 16.4; Platelets 273.0 06/02/2018: TSH 4.90 08/18/2018: ALT 81; BUN 6; Creatinine, Ser 0.69; Potassium 4.1; Sodium 137    Lipid Panel    Component Value Date/Time   CHOL 183 06/02/2018 0956   TRIG 89.0 06/02/2018 0956   HDL 73.20 06/02/2018 0956   CHOLHDL 2 06/02/2018 0956   VLDL 17.8 06/02/2018 0956   LDLCALC 92 06/02/2018 0956      Wt Readings from Last 3  Encounters:  09/23/18 110 lb 1.9 oz (50 kg)  08/20/18 110 lb 3.2 oz (50 kg)  08/18/18 112 lb 6.4 oz (51 kg)        ASSESSMENT AND PLAN:  1.  Peripheral arterial disease: Status post left common iliac artery covered stent placement due to in situ thrombosis. Continue lifelong dual antiplatelet therapy. She has no claudication and does have palpable pulses by exam.  Repeat Doppler studies in 6 months.  2. Hyperlipidemia: She reports that most recent lipid profile done in July was done while she was not taking rosuvastatin.  Her LDL was 92.  She worries about side effects.  I decreased rosuvastatin to 10 mg daily with the hope that she will take the medication.  3. Carotid disease: This was mild on recent carotid Doppler.  4. Coronary calcifications: No anginal symptoms. Continue aggressive treatment of risk factors.  5. Tobacco use: I again discussed with  her the importance of smoking cessation.  Disposition:   FU with me in 6 months  Signed,  [name], MD  09/23/2018 4:11 PM    [facility]

## 2018-09-30 ENCOUNTER — Ambulatory Visit: Payer: BC Managed Care – PPO | Admitting: Oncology

## 2018-10-10 ENCOUNTER — Telehealth: Payer: Self-pay | Admitting: *Deleted

## 2018-10-10 NOTE — Telephone Encounter (Signed)
Patient asking if we can sed orders to Dr Lupita Dawn office for her labs needing to be drawn for her Hemachromatosis appointment coming up

## 2018-10-10 NOTE — Telephone Encounter (Signed)
That's fine

## 2018-10-10 NOTE — Telephone Encounter (Signed)
Please enter or send orders of what you want checked

## 2018-10-10 NOTE — Telephone Encounter (Signed)
I will fax orders

## 2018-10-13 NOTE — Telephone Encounter (Signed)
Orders faxed

## 2018-10-15 ENCOUNTER — Telehealth: Payer: Self-pay | Admitting: Internal Medicine

## 2018-10-15 DIAGNOSIS — E032 Hypothyroidism due to medicaments and other exogenous substances: Secondary | ICD-10-CM

## 2018-10-15 DIAGNOSIS — K760 Fatty (change of) liver, not elsewhere classified: Secondary | ICD-10-CM

## 2018-10-15 NOTE — Telephone Encounter (Signed)
I have orderede labs on this patient that  Dr [name] requested.  I see that Dr Marius Ditch ordered quite a few labs at her GI visit in September,  Which have not been done yet.  I assume she still wants them done and they should be drawn and sent to Dr Marius Ditch,  Not to me. If you cannot see them, then they will need to be reordered , so can you let Dr. Marius Ditch know?

## 2018-10-16 ENCOUNTER — Other Ambulatory Visit (INDEPENDENT_AMBULATORY_CARE_PROVIDER_SITE_OTHER): Payer: BC Managed Care – PPO

## 2018-10-16 DIAGNOSIS — E032 Hypothyroidism due to medicaments and other exogenous substances: Secondary | ICD-10-CM

## 2018-10-16 DIAGNOSIS — K760 Fatty (change of) liver, not elsewhere classified: Secondary | ICD-10-CM

## 2018-10-16 NOTE — Telephone Encounter (Signed)
This pt is scheduled for labs on Monday at the cancer center. Not here. So hopefully they will draw your labs too.

## 2018-10-17 LAB — CBC WITH DIFFERENTIAL/PLATELET
BASOS PCT: 1.3 %
Basophils Absolute: 134 cells/uL (ref 0–200)
EOS ABS: 113 {cells}/uL (ref 15–500)
Eosinophils Relative: 1.1 %
HEMATOCRIT: 38.3 % (ref 35.0–45.0)
Hemoglobin: 13.9 g/dL (ref 11.7–15.5)
LYMPHS ABS: 4996 {cells}/uL — AB (ref 850–3900)
MCH: 38.5 pg — AB (ref 27.0–33.0)
MCHC: 36.3 g/dL — ABNORMAL HIGH (ref 32.0–36.0)
MCV: 106.1 fL — AB (ref 80.0–100.0)
MPV: 12.7 fL — ABNORMAL HIGH (ref 7.5–12.5)
Monocytes Relative: 5.5 %
NEUTROS PCT: 43.6 %
Neutro Abs: 4491 cells/uL (ref 1500–7800)
PLATELETS: 187 10*3/uL (ref 140–400)
RBC: 3.61 10*6/uL — AB (ref 3.80–5.10)
RDW: 12.6 % (ref 11.0–15.0)
TOTAL LYMPHOCYTE: 48.5 %
WBC: 10.3 10*3/uL (ref 3.8–10.8)
WBCMIX: 567 {cells}/uL (ref 200–950)

## 2018-10-17 LAB — COMPREHENSIVE METABOLIC PANEL
AG Ratio: 1.6 (calc) (ref 1.0–2.5)
ALBUMIN MSPROF: 3.9 g/dL (ref 3.6–5.1)
ALKALINE PHOSPHATASE (APISO): 220 U/L — AB (ref 33–130)
ALT: 186 U/L — ABNORMAL HIGH (ref 6–29)
AST: 449 U/L — AB (ref 10–35)
BILIRUBIN TOTAL: 1.6 mg/dL — AB (ref 0.2–1.2)
BUN: 12 mg/dL (ref 7–25)
CALCIUM: 9.5 mg/dL (ref 8.6–10.4)
CHLORIDE: 95 mmol/L — AB (ref 98–110)
CO2: 15 mmol/L — AB (ref 20–32)
Creat: 0.59 mg/dL (ref 0.50–0.99)
GLOBULIN: 2.5 g/dL (ref 1.9–3.7)
Glucose, Bld: 122 mg/dL — ABNORMAL HIGH (ref 65–99)
POTASSIUM: 3.8 mmol/L (ref 3.5–5.3)
Sodium: 131 mmol/L — ABNORMAL LOW (ref 135–146)
Total Protein: 6.4 g/dL (ref 6.1–8.1)

## 2018-10-17 LAB — IRON,TIBC AND FERRITIN PANEL
%SAT: 81 % (calc) — ABNORMAL HIGH (ref 16–45)
Ferritin: 1240 ng/mL — ABNORMAL HIGH (ref 16–288)
IRON: 230 ug/dL — AB (ref 45–160)
TIBC: 283 ug/dL (ref 250–450)

## 2018-10-17 LAB — TSH: TSH: 21.36 m[IU]/L — AB (ref 0.40–4.50)

## 2018-10-17 LAB — T4, FREE: FREE T4: 1 ng/dL (ref 0.8–1.8)

## 2018-10-19 NOTE — Progress Notes (Signed)
[name]  [phone]) [phone] [phone]) [phone]  ID: Tracie Morris OB: [birth year]  MR#: [id number]  LTJ#:[id number]  Patient Care Team: [name], MD as PCP - General (Internal Medicine) [name], MD (Internal Medicine)  CHIEF COMPLAINT: Hereditary hemochromatosis, compound heterozygote for C282Y and H63D mutations.  INTERVAL HISTORY: Patient returns to clinic today for repeat laboratory work, further evaluation, and continuation of phlebotomy.  She has increased weight loss, worsening tremors, and persistent weakness and fatigue.  She has no other neurologic complaints.  She denies any recent fevers or illnesses.  She has no chest pain or shortness of breath. She denies any nausea, vomiting, constipation, or diarrhea. She has no urinary complaints.  Patient offers no further specific complaints today.  REVIEW OF SYSTEMS:   Review of Systems  Constitutional: Positive for malaise/fatigue and weight loss. Negative for fever.  HENT: Negative.   Respiratory: Negative.  Negative for cough and shortness of breath.   Cardiovascular: Negative.  Negative for chest pain and leg swelling.  Gastrointestinal: Negative.  Negative for abdominal pain.  Genitourinary: Negative.  Negative for dysuria.  Musculoskeletal: Negative.  Negative for back pain.  Skin: Negative.  Negative for rash.  Neurological: Positive for tremors and weakness. Negative for focal weakness and headaches.  Psychiatric/Behavioral: Negative.  The patient is not nervous/anxious.     As per HPI. Otherwise, a complete review of systems is negative.  PAST MEDICAL HISTORY: Past Medical History:  Diagnosis Date  . Carotid artery occlusion   . Hyperlipidemia   . Hypocalcemia   . hypothyroidism 2007   acquired, secondary to surgery for thyroid Ca  . Migraine    improved since thyroid surgery, 2 years  . multinodular goiter 2007   s/p thyroidectomy  . 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 Repeat angiography in November of 2014 showed 50% distal aortic stenosis extending into the left common iliac artery which had 60-70% ostial stenosis. No other significant disease. I placed on balloon expandable stent into  left common iliac artery extending slightly into the distal aorta    PAST SURGICAL HISTORY: Past Surgical History:  Procedure Laterality Date  . ABDOMINAL AORTAGRAM N/A 10/14/2013   Procedure: ABDOMINAL [name];  Surgeon: Wellington Hampshire, MD;  Location: [facility];  Service: Cardiovascular;  Laterality: N/A;  . ANGIOPLASTY ILLIAC ARTERY    . CESAREAN SECTION    . NOSE SURGERY    . PERIPHERAL VASCULAR CATHETERIZATION N/A 11/14/2016   Procedure: Abdominal Aortogram;  Surgeon: Wellington Hampshire, MD;  Location: [facility];  Service: Cardiovascular;  Laterality: N/A;  . PERIPHERAL VASCULAR CATHETERIZATION Bilateral 11/14/2016   Procedure: Lower Extremity Angiography;  Surgeon: Wellington Hampshire, MD;  Location: [facility];  Service: Cardiovascular;  Laterality: Bilateral;  limited iliacs only  . PERIPHERAL VASCULAR CATHETERIZATION Left 11/14/2016   Procedure: Peripheral Vascular Intervention;  Surgeon: Wellington Hampshire, MD;  Location: [facility];  Service: Cardiovascular;  Laterality: Left;  common iliac  . SKIN CANCER EXCISION    . THYROID SURGERY  2007   secondary to thyroid cancer    FAMILY HISTORY: Family History  Problem Relation Age of Onset  . Alzheimer's disease Father   . Stroke Mother   . Coronary artery disease Maternal Grandmother   . Heart disease Maternal Grandmother 70  . Coronary artery disease Paternal Grandmother 34  .  Heart disease Paternal Grandmother 71    ADVANCED DIRECTIVES (Y/N):  N  HEALTH MAINTENANCE: Social History   Tobacco Use  . Smoking status: Current Every Day Smoker    Packs/day: 0.50     Years: 20.00    Pack years: 10.00    Types: Cigarettes    Last attempt to quit: 10/28/2011    Years since quitting: 7.0  . Smokeless tobacco: Never Used  Substance Use Topics  . Alcohol use: No  . Drug use: No     Colonoscopy:  PAP:  Bone density:  Lipid panel:  Allergies  Allergen Reactions  . Atorvastatin Other (See Comments)    Myalgias   . Darvocet [Propoxyphene N-Acetaminophen] Nausea And Vomiting  . Demerol Nausea And Vomiting  . Methocarbamol Other (See Comments)    UNKNOWN  . Mirtazapine     Sleeplessness, nausea   . Primidone     Severe insomnia, myoclonus and parathesias   . Ambien [Zolpidem Tartrate] Palpitations    Anxiety , trembling.    Current Outpatient Medications  Medication Sig Dispense Refill  . ALPRAZolam (XANAX) 1 MG tablet Take 1 tablet (1 mg total) by mouth at bedtime as needed for anxiety. 30 tablet 5  . clopidogrel (PLAVIX) 75 MG tablet TAKE ONE (1) TABLET BY MOUTH EVERY DAY 90 tablet 1  . levothyroxine (SYNTHROID, LEVOTHROID) 112 MCG tablet Take 112 mcg by mouth daily before breakfast.    . Multiple Vitamin (MULTIVITAMIN WITH MINERALS) TABS tablet Take 1 tablet by mouth daily.    Marland Kitchen PICATO 0.05 % GEL     . rosuvastatin (CRESTOR) 10 MG tablet Take 1 tablet (10 mg total) by mouth daily. 90 tablet 1  . terbinafine (LAMISIL) 1 % cream Apply 1 application topically 2 (two) times daily. 30 g 0  . TOLAK 4 % CREA      No current facility-administered medications for this visit.     OBJECTIVE: Vitals:   10/20/18 1008  BP: 122/67  Pulse: 96  Resp: 18  Temp: (!) 97.4 F (36.3 C)     Body mass index is 16.46 kg/m.    ECOG FS:0 - Asymptomatic  General: Well-developed, well-nourished, no acute distress. Eyes: Pink conjunctiva, anicteric sclera. HEENT: Normocephalic, moist mucous membranes. Lungs: Clear to auscultation bilaterally. Heart: Regular rate and rhythm. No rubs, murmurs, or gallops. Abdomen: Soft, nontender, nondistended. No  organomegaly noted, normoactive bowel sounds. Musculoskeletal: No edema, cyanosis, or clubbing. Neuro: Alert, answering all questions appropriately. Cranial nerves grossly intact. Skin: No rashes or petechiae noted. Psych: Normal affect.  LAB RESULTS:  Lab Results  Component Value Date   NA 131 (L) 10/16/2018   K 3.8 10/16/2018   CL 95 (L) 10/16/2018   CO2 15 (L) 10/16/2018   GLUCOSE 122 (H) 10/16/2018   BUN 12 10/16/2018   CREATININE 0.59 10/16/2018   CALCIUM 9.5 10/16/2018   PROT 6.4 10/16/2018   ALBUMIN 4.0 08/18/2018   AST 449 (H) 10/16/2018   ALT 186 (H) 10/16/2018   ALKPHOS 165 (H) 08/18/2018   BILITOT 1.6 (H) 10/16/2018   GFRNONAA 94 11/05/2016   GFRAA 109 11/05/2016    Lab Results  Component Value Date   WBC 10.3 10/16/2018   NEUTROABS 4,491 10/16/2018   HGB 13.9 10/16/2018   HCT 38.3 10/16/2018   MCV 106.1 (H) 10/16/2018   PLT 187 10/16/2018   Lab Results  Component Value Date   IRON 230 (H) 10/16/2018   TIBC 283 10/16/2018   IRONPCTSAT 81 (H)  10/16/2018   Lab Results  Component Value Date   FERRITIN 1,240 (H) 10/16/2018     STUDIES: No results found.  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  PLAN:    1. Hereditary hemochromatosis: Patient's ferritin level has significantly increased to 1240.  This is most likely falsely elevated secondary to her other ongoing issues.  Although, her total iron is 230 and her iron saturation is 81%.  Patient states she gets profoundly weak and fatigued when she receives phlebotomy, but wishes to proceed today as scheduled with [facility].  She is on Plavix, therefore her phlebotomy donation cannot be recycled to the community.  Return to clinic in 3 months with repeat laboratory work and further evaluation. 2.  Thyroid: Patient reports her medications are being adjusted.  This is possibly the etiology of her weight loss and profound weakness and fatigue.     3.   Tremors: Continue evaluation and work-up per primary care.  I spent a total of 30 minutes face-to-face with the patient of which greater than 50% of the visit was spent in counseling and coordination of care as detailed above.   Patient expressed understanding and was in agreement with this plan. She also understands that She can call clinic at any time with any questions, concerns, or complaints.    Lloyd Huger, MD   10/26/2018 7:56 AM

## 2018-10-20 ENCOUNTER — Inpatient Hospital Stay: Payer: BC Managed Care – PPO | Attending: Oncology | Admitting: Oncology

## 2018-10-20 ENCOUNTER — Encounter: Payer: Self-pay | Admitting: Oncology

## 2018-10-20 ENCOUNTER — Inpatient Hospital Stay: Payer: BC Managed Care – PPO

## 2018-10-20 ENCOUNTER — Telehealth: Payer: Self-pay | Admitting: Internal Medicine

## 2018-10-20 ENCOUNTER — Other Ambulatory Visit: Payer: Self-pay

## 2018-10-20 DIAGNOSIS — R634 Abnormal weight loss: Secondary | ICD-10-CM | POA: Insufficient documentation

## 2018-10-20 DIAGNOSIS — Z85850 Personal history of malignant neoplasm of thyroid: Secondary | ICD-10-CM | POA: Insufficient documentation

## 2018-10-20 DIAGNOSIS — E785 Hyperlipidemia, unspecified: Secondary | ICD-10-CM | POA: Insufficient documentation

## 2018-10-20 DIAGNOSIS — R5383 Other fatigue: Secondary | ICD-10-CM

## 2018-10-20 DIAGNOSIS — E890 Postprocedural hypothyroidism: Secondary | ICD-10-CM | POA: Diagnosis not present

## 2018-10-20 DIAGNOSIS — I739 Peripheral vascular disease, unspecified: Secondary | ICD-10-CM | POA: Diagnosis not present

## 2018-10-20 DIAGNOSIS — R251 Tremor, unspecified: Secondary | ICD-10-CM | POA: Diagnosis not present

## 2018-10-20 DIAGNOSIS — Z79899 Other long term (current) drug therapy: Secondary | ICD-10-CM | POA: Diagnosis not present

## 2018-10-20 DIAGNOSIS — R5381 Other malaise: Secondary | ICD-10-CM | POA: Diagnosis not present

## 2018-10-20 DIAGNOSIS — Z7902 Long term (current) use of antithrombotics/antiplatelets: Secondary | ICD-10-CM | POA: Diagnosis not present

## 2018-10-20 DIAGNOSIS — F17210 Nicotine dependence, cigarettes, uncomplicated: Secondary | ICD-10-CM | POA: Diagnosis not present

## 2018-10-20 DIAGNOSIS — Z85828 Personal history of other malignant neoplasm of skin: Secondary | ICD-10-CM | POA: Insufficient documentation

## 2018-10-20 DIAGNOSIS — R531 Weakness: Secondary | ICD-10-CM | POA: Insufficient documentation

## 2018-10-20 MED ORDER — SODIUM CHLORIDE 0.9 % IV SOLN
INTRAVENOUS | Status: DC
  Filled 2018-10-20: qty 250

## 2018-10-20 MED ORDER — SODIUM CHLORIDE 0.9 % IV SOLN
Freq: Once | INTRAVENOUS | Status: AC
  Administered 2018-10-20: 12:00:00 via INTRAVENOUS
  Filled 2018-10-20: qty 250

## 2018-10-20 NOTE — Telephone Encounter (Signed)
Patient called regarding labs that were done at another practice. She is returning a call regarding a message that was sent to her via [email or web address]  Per patient she is still taking her Levothyroxine and has had no changes in other medicines.

## 2018-10-20 NOTE — Progress Notes (Signed)
[facility] phlebotomy unable to perform phlebotomy.456ml pulled per order in Epic. Patient went to leave, became shaky, lightheaded. BP checked and HR elevated to 145. Beckey Rutter NP chairside. 1 L NS ordered. Will continue to monitor   1259-1 L NS infused. HR returned to baseline. Patient states feeling better. Escorted out by wheelchair

## 2018-10-20 NOTE — Progress Notes (Signed)
Patient here today for follow up and phlebotomy for hereditary hemochromatosis. Patient reports worsening hand tremors, skin cancer removed from nose. Patient also reports weakness and low blood sugars after phlebotomies.

## 2018-10-27 ENCOUNTER — Telehealth: Payer: Self-pay | Admitting: Internal Medicine

## 2018-10-27 NOTE — Telephone Encounter (Signed)
Copied from [place] [id number]. Topic: Quick Communication - See Telephone Encounter >> Oct 27, 2018  2:37 PM [place] Rieger wrote: CRM for notification. See Telephone encounter for: 10/27/18.  Patient states that Dr [name] advised her that she has bigger problems than hematolgy. She said that she called over to Dr Chrissie Noa office and nobody has called her back yet. She said that Dr Gary Fleet office had to give her fluids and take her out in a wheelchair on Monday, November 25th. She would like Dr Derrel Nip to give her a call back.

## 2018-10-27 NOTE — Telephone Encounter (Signed)
LMTCB. Please transfer to our office.  

## 2018-10-27 NOTE — Telephone Encounter (Signed)
Please advise 

## 2018-10-28 NOTE — Telephone Encounter (Signed)
Spoke with Dr. Derrel Nip verbally and she stated that she would like for the pt to schedule an appt. Called pt and appt has been scheduled for tomorrow at 11:30am. Pt is aware of appt date and time.

## 2018-10-29 ENCOUNTER — Ambulatory Visit: Payer: BC Managed Care – PPO | Admitting: Internal Medicine

## 2018-10-29 ENCOUNTER — Encounter: Payer: Self-pay | Admitting: Internal Medicine

## 2018-10-29 DIAGNOSIS — F1010 Alcohol abuse, uncomplicated: Secondary | ICD-10-CM

## 2018-10-29 DIAGNOSIS — F411 Generalized anxiety disorder: Secondary | ICD-10-CM | POA: Diagnosis not present

## 2018-10-29 DIAGNOSIS — E032 Hypothyroidism due to medicaments and other exogenous substances: Secondary | ICD-10-CM | POA: Diagnosis not present

## 2018-10-29 DIAGNOSIS — G252 Other specified forms of tremor: Secondary | ICD-10-CM | POA: Diagnosis not present

## 2018-10-29 DIAGNOSIS — F430 Acute stress reaction: Secondary | ICD-10-CM

## 2018-10-29 MED ORDER — ESCITALOPRAM OXALATE 5 MG PO TABS: 5.0000 mg | ORAL_TABLET | Freq: Every day | ORAL | 2 refills | Status: DC

## 2018-10-29 MED ORDER — LEVOTHYROXINE SODIUM 125 MCG PO TABS: 125.0000 ug | ORAL_TABLET | Freq: Every day | ORAL | 0 refills | Status: DC

## 2018-10-29 MED ORDER — CLONAZEPAM 0.5 MG PO TABS: 0.5000 mg | ORAL_TABLET | Freq: Two times a day (BID) | ORAL | 2 refills | Status: DC | PRN

## 2018-10-29 NOTE — Patient Instructions (Addendum)
Increase your protein intake  if you can . Ensure Max protein or Premier Protein   Trial of clonazepam twice daily to help your anxiety and tremor.  STOP DRINKING ALCOHOL  Reduce lexapro dose to 5 mg daily    Agree with 125 mcg levothyroxine dose for 6 weeks,  And repeat TSH in 6 weeks

## 2018-10-29 NOTE — Progress Notes (Signed)
Subjective:  Patient ID: Tracie Morris, female    DOB: May 28, 1956  Age: 62 y.o. MRN: [id number]  CC: Diagnoses of Anxiety as acute reaction to exceptional stress, Iatrogenic hypothyroidism, Intention tremor, and Alcohol abuse were pertinent to this visit.  HPI [name] presents for urgent evaluation of ongoing weight loss , tremors and weakness  .  Was seen by Hematology Nov 25  for follow up on [facility] and  therapeutic phlebotomy ,  She was  very tremulous and  weak .  She  was given  IV fluids, she states that she was told by [name] that "her endocrine system  system had "crashed" and to follow up with PCP :  .  Thyroid function  Is unceractive currently based on review of labs ordered by Dr. [name] . Per patient,  Her dose of levothyroxine  was reduced from 125 mcg to 112 mcg by Dr [name] several months ago, . In the spring after patient reported palpitations and decreased sleep.at her visit. However patient states that she commonly feels this way after a dose increase and he symptoms resolve after 2-3 weeks.   Patient has increased her levothyroxine dose to 125 mcg after seeing last week's TSH of 22 .    She is having increased difficulty working due to her worsening right hand Intention Tremor. She did not tolerate trials of primidone, neurontin, and more recently, atenolol,  Due to hypotension and clinical improvement on daily dosing.    She  has requested referral to both the [facility] clinic and the noted neurologist DR [name] at [facility]  For consideration of a novel  ablative procedure .  Fatty liver:  She freely acknowledges  recurrent overuse of alcohol to manage her hadn  tremor and her anxiety.  She has lost weight due to anorexia .  Her appetite is poor but improves when physically removed from her daily stressors.  She is limited somewhat by prior Nissen funduplication procedure .  Anxiety.  Taking lexapro 10 mg at bedtime makes her feel dizzy in the morning although she feels  emotionally better.     Son has Gran[place] and is taking a  Non FDA approved medication to lower his iron  without phlebotomy     Outpatient Medications Prior to Visit  Medication Sig Dispense Refill  . ALPRAZolam (XANAX) 1 MG tablet Take 1 tablet (1 mg total) by mouth at bedtime as needed for anxiety. 30 tablet 5  . clopidogrel (PLAVIX) 75 MG tablet TAKE ONE (1) TABLET BY MOUTH EVERY DAY 90 tablet 1  . Multiple Vitamin (MULTIVITAMIN WITH MINERALS) TABS tablet Take 1 tablet by mouth daily.    Marland Kitchen PICATO 0.05 % GEL     . rosuvastatin (CRESTOR) 10 MG tablet Take 1 tablet (10 mg total) by mouth daily. 90 tablet 1  . terbinafine (LAMISIL) 1 % cream Apply 1 application topically 2 (two) times daily. 30 g 0  . TOLAK 4 % CREA     . levothyroxine (SYNTHROID, LEVOTHROID) 112 MCG tablet Take 112 mcg by mouth daily before breakfast.     No facility-administered medications prior to visit.     Review of Systems;  Patient denies headache, fevers, malaise, unintentional weight loss, skin rash, eye pain, sinus congestion and sinus pain, sore throat, dysphagia,  hemoptysis , cough, dyspnea, wheezing, chest pain, palpitations, orthopnea, edema, abdominal pain, nausea, melena, diarrhea, constipation, flank pain, dysuria, hematuria, urinary  Frequency, nocturia, numbness, tingling, seizures,  Focal weakness, Loss of consciousness,  Tremor, insomnia, depression, anxiety, and suicidal ideation.      Objective:  BP 120/74 (BP Location: Left Arm, Patient Position: Sitting, Cuff Size: Normal)   Pulse 87   Temp 98.1 F (36.7 C) (Oral)   Resp 15   Ht 5' 7.5" (1.715 m)   Wt 107 lb (48.5 kg)   SpO2 97%   BMI 16.51 kg/m   BP Readings from Last 3 Encounters:  10/29/18 120/74  10/20/18 138/86  10/20/18 122/67    Wt Readings from Last 3 Encounters:  10/29/18 107 lb (48.5 kg)  10/20/18 106 lb 11.2 oz (48.4 kg)  09/23/18 110 lb 1.9 oz (50 kg)    General appearance: alert, cooperative and appears stated  age Ears: normal TM's and external ear canals both ears Throat: lips, mucosa, and tongue normal; teeth and gums normal Neck: no adenopathy, no carotid bruit, supple, symmetrical, trachea midline and thyroid not enlarged, symmetric, no tenderness/mass/nodules Back: symmetric, no curvature. ROM normal. No CVA tenderness. Lungs: clear to auscultation bilaterally Heart: regular rate and rhythm, S1, S2 normal, no murmur, click, rub or gallop Abdomen: soft, non-tender; bowel sounds normal; no masses,  no organomegaly Pulses: 2+ and symmetric Skin: Skin color, texture, turgor normal. No rashes or lesions Lymph nodes: Cervical, supraclavicular, and axillary nodes normal.  No results found for: HGBA1C  Lab Results  Component Value Date   CREATININE 0.59 10/16/2018   CREATININE 0.69 08/18/2018   CREATININE 0.77 06/02/2018    Lab Results  Component Value Date   WBC 10.3 10/16/2018   HGB 13.9 10/16/2018   HCT 38.3 10/16/2018   PLT 187 10/16/2018   GLUCOSE 122 (H) 10/16/2018   CHOL 183 06/02/2018   TRIG 89.0 06/02/2018   HDL 73.20 06/02/2018   LDLCALC 92 06/02/2018   ALT 186 (H) 10/16/2018   AST 449 (H) 10/16/2018   NA 131 (L) 10/16/2018   K 3.8 10/16/2018   CL 95 (L) 10/16/2018   CREATININE 0.59 10/16/2018   BUN 12 10/16/2018   CO2 15 (L) 10/16/2018   TSH 21.36 (H) 10/16/2018   INR 1.0 11/05/2016   MICROALBUR 0.6 01/11/2014    US Abdomen Limited Ruq  Result Date: 08/25/2018 CLINICAL DATA:  Hereditary hemochromatosis. Elevated LFTs. History of hepatic steatosis. EXAM: ULTRASOUND ABDOMEN LIMITED RIGHT UPPER QUADRANT COMPARISON:  PET-CT study of August 02, 2015 FINDINGS: Gallbladder: The gallbladder is adequately distended. A small amount of echogenic sludge layers in the fundus. There is no discrete stone. There is no wall thickening or pericholecystic fluid. There is no positive sonographic Murphy's sign. Common bile duct: Diameter: 4.8-6.9 mm. No intraluminal stones or sludge are  observed. Liver: The hepatic echotexture is mildly increased. The surface contour remains smooth. There is no focal mass nor ductal dilation. Portal vein is patent on color Doppler imaging with normal direction of blood flow towards the liver. IMPRESSION: Gallbladder sludge without discrete stones. No sonographic evidence of acute cholecystitis. Mildly dilated common bile duct distally without evidence of intraluminal stones or sludge. Mildly increased echotexture of the liver may reflect fatty infiltrative change. No suspicious hepatic masses are observed. Electronically Signed   By: David  Martinique M.D.   On: 08/25/2018 10:36    Assessment & Plan:   Problem List Items Addressed This Visit    Alcohol abuse    Advised to abstain or at the very least reduce to one glass of wine every other day given fatty liver and elevated AST/ALT   Lab Results  Component Value Date  ALT 186 (H) 10/16/2018   AST 449 (H) 10/16/2018   ALKPHOS 165 (H) 08/18/2018   BILITOT 1.6 (H) 10/16/2018         Anxiety as acute reaction to exceptional stress    She is at high risk for liver failure given her ongoing alcohol abuse.  Will start ow dose clonazepam to manage anxiety as AN ALTERNATIVE to alcohol.  30 day supply written,  Continue 5 mg lexapro (dose lowered)      Relevant Medications   escitalopram (LEXAPRO) 5 MG tablet   Iatrogenic hypothyroidism    Dose increased   from 112 mcg to 125 mcg daily given high  TSH  Lab Results  Component Value Date   TSH 21.36 (H) 10/16/2018         Relevant Medications   levothyroxine (SYNTHROID, LEVOTHROID) 125 MCG tablet   Intention tremor    She has failed pharmacotherapy and is awaiting neurologic evaluation by [facility] neurologist [place]          I have changed Danesha R. Droege's levothyroxine. I am also having her start on clonazePAM and escitalopram. Additionally, I am having her maintain her multivitamin with minerals, PICATO, TOLAK, terbinafine, ALPRAZolam,  rosuvastatin, and clopidogrel.  Meds ordered this encounter  Medications  . clonazePAM (KLONOPIN) 0.5 MG tablet    Sig: Take 1 tablet (0.5 mg total) by mouth 2 (two) times daily as needed for anxiety.    Dispense:  60 tablet    Refill:  2  . escitalopram (LEXAPRO) 5 MG tablet    Sig: Take 1 tablet (5 mg total) by mouth daily.    Dispense:  30 tablet    Refill:  2  . levothyroxine (SYNTHROID, LEVOTHROID) 125 MCG tablet    Sig: Take 1 tablet (125 mcg total) by mouth daily before breakfast.    Dispense:  90 tablet    Refill:  0    KEEP ON FILE FOR FUTURE REFILLS    Medications Discontinued During This Encounter  Medication Reason  . levothyroxine (SYNTHROID, LEVOTHROID) 112 MCG tablet     A total of 40 minutes was spent with patient more than half of which was spent in counseling patient on the above mentioned issues , reviewing and explaining recent labs and imaging studies done, and coordination of care.   Follow-up: No follow-ups on file.   [name], MD

## 2018-10-30 DIAGNOSIS — F1011 Alcohol abuse, in remission: Secondary | ICD-10-CM | POA: Insufficient documentation

## 2018-10-30 DIAGNOSIS — F1020 Alcohol dependence, uncomplicated: Secondary | ICD-10-CM | POA: Insufficient documentation

## 2018-10-30 NOTE — Assessment & Plan Note (Signed)
Advised to abstain or at the very least reduce to one glass of wine every other day given fatty liver and elevated AST/ALT   Lab Results  Component Value Date   ALT 186 (H) 10/16/2018   AST 449 (H) 10/16/2018   ALKPHOS 165 (H) 08/18/2018   BILITOT 1.6 (H) 10/16/2018

## 2018-10-30 NOTE — Assessment & Plan Note (Signed)
Dose increased   from 112 mcg to 125 mcg daily given high  TSH  Lab Results  Component Value Date   TSH 21.36 (H) 10/16/2018

## 2018-10-30 NOTE — Assessment & Plan Note (Signed)
She has failed pharmacotherapy and is awaiting neurologic evaluation by [facility] neurologist [place]

## 2018-10-30 NOTE — Assessment & Plan Note (Signed)
She is at high risk for liver failure given her ongoing alcohol abuse.  Will start ow dose clonazepam to manage anxiety as AN ALTERNATIVE to alcohol.  30 day supply written,  Continue 5 mg lexapro (dose lowered)

## 2018-11-17 ENCOUNTER — Encounter: Payer: BC Managed Care – PPO | Admitting: Internal Medicine

## 2018-11-24 ENCOUNTER — Encounter: Payer: BC Managed Care – PPO | Admitting: Internal Medicine

## 2018-12-15 ENCOUNTER — Telehealth: Payer: Self-pay | Admitting: Internal Medicine

## 2018-12-15 DIAGNOSIS — R748 Abnormal levels of other serum enzymes: Secondary | ICD-10-CM

## 2018-12-15 DIAGNOSIS — E032 Hypothyroidism due to medicaments and other exogenous substances: Secondary | ICD-10-CM

## 2018-12-15 DIAGNOSIS — R634 Abnormal weight loss: Secondary | ICD-10-CM

## 2018-12-15 NOTE — Telephone Encounter (Signed)
Copied from [place] [id number]. Topic: General - Other >> Dec 15, 2018 11:29 AM Burchel, Abbi R wrote: Pt requesting labs orders to have thyroid checked (pt has lost more weight and her hair is continuing to thin).  Pt is also requesting Hep A, B and C and glucose screening.   Please call pt when orders are placed: [phone]   Pt would like to be sched on a Monday, but not next week.

## 2018-12-15 NOTE — Telephone Encounter (Signed)
Labs ordered.

## 2018-12-15 NOTE — Telephone Encounter (Signed)
Is it okay to order these labs?

## 2018-12-16 NOTE — Telephone Encounter (Signed)
Spoke with pt and scheduled her a lab appt for next Monday. Pt is aware of appt date and time.

## 2018-12-22 ENCOUNTER — Other Ambulatory Visit (INDEPENDENT_AMBULATORY_CARE_PROVIDER_SITE_OTHER): Payer: BC Managed Care – PPO

## 2018-12-22 DIAGNOSIS — E032 Hypothyroidism due to medicaments and other exogenous substances: Secondary | ICD-10-CM | POA: Diagnosis not present

## 2018-12-22 DIAGNOSIS — R748 Abnormal levels of other serum enzymes: Secondary | ICD-10-CM

## 2018-12-22 DIAGNOSIS — R634 Abnormal weight loss: Secondary | ICD-10-CM | POA: Diagnosis not present

## 2018-12-22 LAB — COMPREHENSIVE METABOLIC PANEL
ALT: 78 U/L — ABNORMAL HIGH (ref 0–35)
AST: 212 U/L — ABNORMAL HIGH (ref 0–37)
Albumin: 3.9 g/dL (ref 3.5–5.2)
Alkaline Phosphatase: 203 U/L — ABNORMAL HIGH (ref 39–117)
BUN: 5 mg/dL — ABNORMAL LOW (ref 6–23)
CO2: 23 mEq/L (ref 19–32)
Calcium: 9.5 mg/dL (ref 8.4–10.5)
Chloride: 98 mEq/L (ref 96–112)
Creatinine, Ser: 0.63 mg/dL (ref 0.40–1.20)
GFR: 95.29 mL/min (ref >60.00)
Glucose, Bld: 134 mg/dL — ABNORMAL HIGH (ref 70–99)
POTASSIUM: 3.3 meq/L — AB (ref 3.5–5.1)
Sodium: 137 mEq/L (ref 135–145)
Total Bilirubin: 1.5 mg/dL — ABNORMAL HIGH (ref 0.2–1.2)
Total Protein: 6.5 g/dL (ref 6.0–8.3)

## 2018-12-22 LAB — TSH: TSH: 2.97 u[IU]/mL (ref 0.35–4.50)

## 2018-12-22 LAB — T4, FREE: Free T4: 1.19 ng/dL (ref 0.60–1.60)

## 2018-12-22 LAB — HEMOGLOBIN A1C: Hgb A1c MFr Bld: 4.2 % — ABNORMAL LOW (ref 4.6–6.5)

## 2018-12-23 LAB — HEPATITIS PANEL, ACUTE
Hep A IgM: NONREACTIVE
Hep B C IgM: NONREACTIVE
Hepatitis B Surface Ag: NONREACTIVE
Hepatitis C Ab: NONREACTIVE
SIGNAL TO CUT-OFF: 0.02 (ref <1.00)

## 2019-01-16 ENCOUNTER — Other Ambulatory Visit: Payer: Self-pay

## 2019-01-16 NOTE — Progress Notes (Signed)
Done

## 2019-01-18 NOTE — Progress Notes (Signed)
Tracie Morris  [phone] [phone]  ID: Tracie Morris OB: Jun 20, 1956  MR#: [id number]  XTK#:[id number]  Patient Care Team: Tracie Mc, MD as PCP - General (Internal Medicine) Tracie Mc, MD (Internal Medicine)  CHIEF COMPLAINT: Hereditary hemochromatosis, compound heterozygote for C282Y and H63D mutations.  INTERVAL HISTORY: Patient returns to clinic today for further evaluation.  She did not have laboratory work today and has already declined phlebotomy.  She continues to complain of chronic weakness and fatigue, but otherwise feels well.  She has no neurologic complaints.  She denies any further weight loss.  She denies any recent fevers or illnesses.  She has no chest pain or shortness of breath. She denies any nausea, vomiting, constipation, or diarrhea. She has no urinary complaints.  Patient offers no further specific complaints today.  REVIEW OF SYSTEMS:   Review of Systems  Constitutional: Positive for malaise/fatigue. Negative for fever and weight loss.  HENT: Negative.   Respiratory: Negative.  Negative for cough and shortness of breath.   Cardiovascular: Negative.  Negative for chest pain and leg swelling.  Gastrointestinal: Negative.  Negative for abdominal pain.  Genitourinary: Negative.  Negative for dysuria.  Musculoskeletal: Negative.  Negative for back pain.  Skin: Negative.  Negative for rash.  Neurological: Positive for tremors and weakness. Negative for focal weakness and headaches.  Psychiatric/Behavioral: Negative.  The patient is not nervous/anxious.     As per HPI. Otherwise, a complete review of systems is negative.  PAST MEDICAL HISTORY: Past Medical History:  Diagnosis Date  . Carotid artery occlusion   . Hyperlipidemia   . Hypocalcemia   . hypothyroidism 2007   acquired, secondary to surgery for thyroid Ca  . Migraine    improved since thyroid surgery, 2 years  . multinodular goiter 2007   s/p  thyroidectomy  . 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 Repeat angiography in November of 2014 showed 50% distal aortic stenosis extending into the left common iliac artery which had 60-70% ostial stenosis. No other significant disease. I placed on balloon expandable stent into  left common iliac artery extending slightly into the distal aorta    PAST SURGICAL HISTORY: Past Surgical History:  Procedure Laterality Date  . ABDOMINAL AORTAGRAM N/A 10/14/2013   Procedure: ABDOMINAL [name];  Surgeon: Tracie Hampshire, MD;  Location: [facility] CATH [facility];  Service: Cardiovascular;  Laterality: N/A;  . ANGIOPLASTY ILLIAC ARTERY    . CESAREAN SECTION    . NOSE SURGERY    . PERIPHERAL VASCULAR CATHETERIZATION N/A 11/14/2016   Procedure: Abdominal Aortogram;  Surgeon: Tracie Hampshire, MD;  Location: [facility] CV [facility];  Service: Cardiovascular;  Laterality: N/A;  . PERIPHERAL VASCULAR CATHETERIZATION Bilateral 11/14/2016   Procedure: Lower Extremity Angiography;  Surgeon: Tracie Hampshire, MD;  Location: [facility];  Service: Cardiovascular;  Laterality: Bilateral;  limited iliacs only  . PERIPHERAL VASCULAR CATHETERIZATION Left 11/14/2016   Procedure: Peripheral Vascular Intervention;  Surgeon: Tracie Hampshire, MD;  Location: [facility];  Service: Cardiovascular;  Laterality: Left;  common iliac  . SKIN CANCER EXCISION    . THYROID SURGERY  2007   secondary to thyroid cancer    FAMILY HISTORY: Family History  Problem Relation Age of Onset  . Alzheimer's disease Father   . Stroke Mother   . Coronary artery disease Maternal Grandmother   . Heart disease  Maternal Grandmother 83  . Coronary artery disease Paternal Grandmother 70  . Heart disease Paternal Grandmother 62    ADVANCED DIRECTIVES (Y/N):  N  HEALTH MAINTENANCE: Social History   Tobacco Use  . Smoking status:  Current Every Day Smoker    Packs/day: 0.50    Years: 20.00    Pack years: 10.00    Types: Cigarettes    Last attempt to quit: 10/28/2011    Years since quitting: 7.2  . Smokeless tobacco: Never Used  Substance Use Topics  . Alcohol use: No  . Drug use: No     Colonoscopy:  PAP:  Bone density:  Lipid panel:  Allergies  Allergen Reactions  . Atorvastatin Other (See Comments)    Myalgias   . Darvocet [Propoxyphene N-Acetaminophen] Nausea And Vomiting  . Demerol Nausea And Vomiting  . Methocarbamol Other (See Comments)    UNKNOWN  . Mirtazapine     Sleeplessness, nausea   . Primidone     Severe insomnia, myoclonus and parathesias   . Ambien [Zolpidem Tartrate] Palpitations    Anxiety , trembling.    Current Outpatient Medications  Medication Sig Dispense Refill  . ALPRAZolam (XANAX) 1 MG tablet Take 1 tablet (1 mg total) by mouth at bedtime as needed for anxiety. 30 tablet 5  . clopidogrel (PLAVIX) 75 MG tablet TAKE ONE (1) TABLET BY MOUTH EVERY DAY 90 tablet 1  . escitalopram (LEXAPRO) 5 MG tablet Take 1 tablet (5 mg total) by mouth daily. 30 tablet 2  . levothyroxine (SYNTHROID, LEVOTHROID) 125 MCG tablet Take 1 tablet (125 mcg total) by mouth daily before breakfast. 90 tablet 0  . Multiple Vitamin (MULTIVITAMIN WITH MINERALS) TABS tablet Take 1 tablet by mouth daily.    . rosuvastatin (CRESTOR) 10 MG tablet Take 1 tablet (10 mg total) by mouth daily. 90 tablet 1  . TOLAK 4 % CREA     . clonazePAM (KLONOPIN) 0.5 MG tablet Take 1 tablet (0.5 mg total) by mouth 2 (two) times daily as needed for anxiety. (Patient not taking: Reported on 01/19/2019) 60 tablet 2  . PICATO 0.05 % GEL     . terbinafine (LAMISIL) 1 % cream Apply 1 application topically 2 (two) times daily. (Patient not taking: Reported on 01/19/2019) 30 g 0   No current facility-administered medications for this visit.     OBJECTIVE: Vitals:   01/19/19 1021  BP: 126/76  Pulse: 80  Temp: 98.1 F (36.7 C)       Body mass index is 15.74 kg/m.    ECOG FS:0 - Asymptomatic  General: Thin, no acute distress. Eyes: Pink conjunctiva, anicteric sclera. HEENT: Normocephalic, moist mucous membranes. Lungs: Clear to auscultation bilaterally. Heart: Regular rate and rhythm. No rubs, murmurs, or gallops. Abdomen: Soft, nontender, nondistended. No organomegaly noted, normoactive bowel sounds. Musculoskeletal: No edema, cyanosis, or clubbing. Neuro: Alert, answering all questions appropriately. Cranial nerves grossly intact. Skin: No rashes or petechiae noted. Psych: Normal affect.  LAB RESULTS:  Lab Results  Component Value Date   NA 137 12/22/2018   K 3.3 (L) 12/22/2018   CL 98 12/22/2018   CO2 23 12/22/2018   GLUCOSE 134 (H) 12/22/2018   BUN 5 (L) 12/22/2018   CREATININE 0.63 12/22/2018   CALCIUM 9.5 12/22/2018   PROT 6.5 12/22/2018   ALBUMIN 3.9 12/22/2018   AST 212 (H) 12/22/2018   ALT 78 (H) 12/22/2018   ALKPHOS 203 (H) 12/22/2018   BILITOT 1.5 (H) 12/22/2018   GFRNONAA 94 11/05/2016  GFRAA 109 11/05/2016    Lab Results  Component Value Date   WBC 10.3 10/16/2018   NEUTROABS 4,491 10/16/2018   HGB 13.9 10/16/2018   HCT 38.3 10/16/2018   MCV 106.1 (H) 10/16/2018   PLT 187 10/16/2018   Lab Results  Component Value Date   IRON 230 (H) 10/16/2018   TIBC 283 10/16/2018   IRONPCTSAT 81 (H) 10/16/2018   Lab Results  Component Value Date   FERRITIN 1,240 (H) 10/16/2018     STUDIES: No results found.  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  PLAN:    1. Hereditary hemochromatosis: Patient has not had iron stores or ferritin level drawn since November 2019 at which time both ferritin and iron saturation were significantly elevated.  Patient declined blood work or phlebotomy today and states she wishes to try an over-the-counter supplement that her son's takes to help reduce her serum iron levels.  No intervention is  needed.  Return to clinic in 2 months where patient will have laboratory work on an outside physician and then reevaluate 1 to 2 days later to assess whether additional phlebotomy with [facility] if necessary.   2.  Elevated liver enzymes: Possibly related to alcohol use.  Continue monitoring and treatment per primary care.  I spent a total of 20 minutes face-to-face with the patient of which greater than 50% of the visit was spent in counseling and coordination of care as detailed above.   Patient expressed understanding and was in agreement with this plan. She also understands that She can call clinic at any time with any questions, concerns, or complaints.    Lloyd Huger, MD   01/20/2019 12:40 PM

## 2019-01-19 ENCOUNTER — Inpatient Hospital Stay: Payer: BC Managed Care – PPO

## 2019-01-19 ENCOUNTER — Telehealth: Payer: Self-pay

## 2019-01-19 ENCOUNTER — Inpatient Hospital Stay: Payer: BC Managed Care – PPO | Attending: Oncology | Admitting: Oncology

## 2019-01-19 ENCOUNTER — Other Ambulatory Visit: Payer: Self-pay

## 2019-01-19 DIAGNOSIS — E785 Hyperlipidemia, unspecified: Secondary | ICD-10-CM | POA: Diagnosis not present

## 2019-01-19 DIAGNOSIS — Z79899 Other long term (current) drug therapy: Secondary | ICD-10-CM | POA: Diagnosis not present

## 2019-01-19 DIAGNOSIS — E039 Hypothyroidism, unspecified: Secondary | ICD-10-CM | POA: Diagnosis not present

## 2019-01-19 DIAGNOSIS — F17210 Nicotine dependence, cigarettes, uncomplicated: Secondary | ICD-10-CM | POA: Insufficient documentation

## 2019-01-19 NOTE — Telephone Encounter (Signed)
Lab order was sent to Dr. Lupita Dawn office for a lab order for when patient comes back it had been drawn. Patient's follow up appointment is on 03/23/2019.

## 2019-01-19 NOTE — Progress Notes (Signed)
Patient is here today to follow up on her Hereditary hemochromatosis. Patient stated that she is not doing phlebotomy today.

## 2019-02-23 ENCOUNTER — Encounter: Payer: Self-pay | Admitting: Internal Medicine

## 2019-02-23 ENCOUNTER — Other Ambulatory Visit: Payer: Self-pay

## 2019-02-23 ENCOUNTER — Ambulatory Visit (INDEPENDENT_AMBULATORY_CARE_PROVIDER_SITE_OTHER): Payer: BC Managed Care – PPO | Admitting: Internal Medicine

## 2019-02-23 ENCOUNTER — Other Ambulatory Visit (HOSPITAL_COMMUNITY)
Admission: RE | Admit: 2019-02-23 | Discharge: 2019-02-23 | Disposition: A | Discharge status: Home / Self Care | Payer: BC Managed Care – PPO | Source: Ambulatory Visit | Attending: Internal Medicine | Admitting: Internal Medicine

## 2019-02-23 VITALS — BP 110/80 | HR 95 | Temp 97.8°F | Resp 15 | Ht 67.5 in | Wt 96.8 lb

## 2019-02-23 DIAGNOSIS — IMO0001 Reserved for inherently not codable concepts without codable children: Secondary | ICD-10-CM

## 2019-02-23 DIAGNOSIS — Z124 Encounter for screening for malignant neoplasm of cervix: Secondary | ICD-10-CM

## 2019-02-23 DIAGNOSIS — Z1239 Encounter for other screening for malignant neoplasm of breast: Secondary | ICD-10-CM

## 2019-02-23 DIAGNOSIS — E032 Hypothyroidism due to medicaments and other exogenous substances: Secondary | ICD-10-CM | POA: Diagnosis not present

## 2019-02-23 DIAGNOSIS — F1010 Alcohol abuse, uncomplicated: Secondary | ICD-10-CM

## 2019-02-23 DIAGNOSIS — F430 Acute stress reaction: Secondary | ICD-10-CM

## 2019-02-23 DIAGNOSIS — F411 Generalized anxiety disorder: Secondary | ICD-10-CM

## 2019-02-23 DIAGNOSIS — E440 Moderate protein-calorie malnutrition: Secondary | ICD-10-CM

## 2019-02-23 DIAGNOSIS — K700 Alcoholic fatty liver: Secondary | ICD-10-CM

## 2019-02-23 DIAGNOSIS — K732 Chronic active hepatitis, not elsewhere classified: Secondary | ICD-10-CM | POA: Diagnosis not present

## 2019-02-23 DIAGNOSIS — E7801 Familial hypercholesterolemia: Secondary | ICD-10-CM

## 2019-02-23 DIAGNOSIS — Z0001 Encounter for general adult medical examination with abnormal findings: Secondary | ICD-10-CM | POA: Diagnosis not present

## 2019-02-23 DIAGNOSIS — F1020 Alcohol dependence, uncomplicated: Secondary | ICD-10-CM

## 2019-02-23 LAB — COMPREHENSIVE METABOLIC PANEL
ALT: 56 U/L — ABNORMAL HIGH (ref 0–35)
AST: 189 U/L — ABNORMAL HIGH (ref 0–37)
Albumin: 4.2 g/dL (ref 3.5–5.2)
Alkaline Phosphatase: 211 U/L — ABNORMAL HIGH (ref 39–117)
BUN: 9 mg/dL (ref 6–23)
CO2: 24 mEq/L (ref 19–32)
Calcium: 9.2 mg/dL (ref 8.4–10.5)
Chloride: 98 mEq/L (ref 96–112)
Creatinine, Ser: 0.61 mg/dL (ref 0.40–1.20)
GFR: 98.85 mL/min (ref >60.00)
Glucose, Bld: 70 mg/dL (ref 70–99)
Potassium: 3.9 mEq/L (ref 3.5–5.1)
SODIUM: 140 meq/L (ref 135–145)
Total Bilirubin: 1.3 mg/dL — ABNORMAL HIGH (ref 0.2–1.2)
Total Protein: 6.9 g/dL (ref 6.0–8.3)

## 2019-02-23 LAB — LIPID PANEL
Cholesterol: 227 mg/dL — ABNORMAL HIGH (ref 0–200)
HDL: 77.3 mg/dL (ref >39.00)
LDL Cholesterol: 120 mg/dL — ABNORMAL HIGH (ref 0–99)
NonHDL: 149.71
Total CHOL/HDL Ratio: 3
Triglycerides: 148 mg/dL (ref 0.0–149.0)
VLDL: 29.6 mg/dL (ref 0.0–40.0)

## 2019-02-23 LAB — T4, FREE: Free T4: 0.77 ng/dL (ref 0.60–1.60)

## 2019-02-23 LAB — TSH: TSH: 10.06 u[IU]/mL — ABNORMAL HIGH (ref 0.35–4.50)

## 2019-02-23 MED ORDER — ALPRAZOLAM 1 MG PO TABS: 1.0000 mg | ORAL_TABLET | Freq: Every evening | ORAL | 0 refills | Status: DC | PRN

## 2019-02-23 NOTE — Assessment & Plan Note (Addendum)
Addressed today,  Abuse is ongoing and she as alienated family and friends.    A total of 40 minutes was spent with patient more than half of which was spent in counseling patient on the her need to enroll in a voluntary alcohol dependence and reab program.  [facility] [place].

## 2019-02-23 NOTE — Progress Notes (Addendum)
Patient ID: Tracie Morris, female    DOB: 06-27-1956  Age: 63 y.o. MRN: [id number]  The patient is here for annual preventive examination and management of other chronic and acute problems.   due for pelvic exam and pap   Last mammogram 2017 (" I keep forgetting")  Last colonoscopy NEVER DEXA 2014 [facility]     The risk factors are reflected in the social history.  The roster of all physicians providing medical care to patient - is listed in the Snapshot section of the chart.  Activities of daily living:  The patient is 100% independent in all ADLs: dressing, toileting, feeding as well as independent mobility  Home safety : The patient has smoke detectors in the home. They wear seatbelts.  There are no firearms at home. There is no violence in the home.   There is no risks for hepatitis, STDs or HIV. There is no   history of blood transfusion. They have no travel history to infectious disease endemic areas of the world.  The patient has seen their dentist in the last six month. They have seen their eye doctor in the last year. They admit to slight hearing difficulty with regard to whispered voices and some television programs.  They have deferred audiologic testing in the last year.  They do not  have excessive sun exposure. Discussed the need for sun protection: hats, long sleeves and use of sunscreen if there is significant sun exposure.   Diet: the importance of a healthy diet is discussed. They do have a healthy diet.  The benefits of regular aerobic exercise were discussed. She walks 4 times per week ,  20 minutes.   Depression screen: there are multiple signs and vegative symptoms of depression including weight loss, reports of anorexia and alcohol abuse , anxiety ad  sadness/tearfullness.  Cognitive assessment: the patient manages all their financial and personal affairs and is actively engaged. They could relate day,date,year and events; recalled 2/3 objects at 3 minutes; performed  clock-face test normally.  The following portions of the patient's history were reviewed and updated as appropriate: allergies, current medications, past family history, past medical history,  past surgical history, past social history  and problem list.  Visual acuity was not assessed per patient preference since she has regular follow up with her ophthalmologist. Hearing and body mass index were assessed and reviewed.   During the course of the visit the patient was educated and counseled about appropriate screening and preventive services including : fall prevention , diabetes screening, nutrition counseling, colorectal cancer screening, and recommended immunizations.    CC: The primary encounter diagnosis was Iatrogenic hypothyroidism. Diagnoses of Screening for cervical cancer, Screening for breast cancer, Alcohol abuse, Chronic active hepatitis ([place]), Familial hypercholesterolemia, Anxiety as acute reaction to exceptional stress, Alcoholic fatty liver, Alcoholism /alcohol abuse ([place]), Encounter for general adult medical examination with abnormal findings, Hereditary hemochromatosis ([place]), and Moderate protein-calorie malnutrition ([place]) were also pertinent to this visit.  Wt loss of 17 lbs since March 2018  bmi now 8.  Cites anorexia, Sense of taste has changed,  Cites multiple stressors causing her to forget to eat.  "I drink too much." Alcohol abuse addressed . States she has a friend that works at a local outpatient rehab center but has not been aggressive about getting an appointment .  More preoccupied with Right hand tremor, workup is delayed at [facility] due to [place] has a past medical history of Carotid artery occlusion,  Hyperlipidemia, Hypocalcemia, hypothyroidism (2007), Migraine, multinodular goiter (2007), and Peripheral arterial disease ([place]).   She has a past surgical history that includes Thyroid surgery (2007); Angioplasty illiac artery; Cesarean section; Nose  surgery; Skin cancer excision; abdominal aortagram (N/A, 10/14/2013); Cardiac catheterization (N/A, 11/14/2016); Cardiac catheterization (Bilateral, 11/14/2016); and Cardiac catheterization (Left, 11/14/2016).   Her family history includes Alzheimer's disease in her father; Coronary artery disease in her maternal grandmother; Coronary artery disease (age of onset: 76) in her paternal grandmother; Heart disease (age of onset: 63) in her maternal grandmother; Heart disease (age of onset: 45) in her paternal grandmother; Stroke in her mother.She reports that she has been smoking cigarettes. She has a 10.00 pack-year smoking history. She has never used smokeless tobacco. She reports that she does not drink alcohol or use drugs.  Outpatient Medications Prior to Visit  Medication Sig Dispense Refill  . clopidogrel (PLAVIX) 75 MG tablet TAKE ONE (1) TABLET BY MOUTH EVERY DAY 90 tablet 1  . levothyroxine (SYNTHROID, LEVOTHROID) 125 MCG tablet Take 1 tablet (125 mcg total) by mouth daily before breakfast. 90 tablet 0  . Multiple Vitamin (MULTIVITAMIN WITH MINERALS) TABS tablet Take 1 tablet by mouth daily.    Marland Kitchen PICATO 0.05 % GEL     . rosuvastatin (CRESTOR) 10 MG tablet Take 1 tablet (10 mg total) by mouth daily. 90 tablet 1  . terbinafine (LAMISIL) 1 % cream Apply 1 application topically 2 (two) times daily. 30 g 0  . TOLAK 4 % CREA     . ALPRAZolam (XANAX) 1 MG tablet Take 1 tablet (1 mg total) by mouth at bedtime as needed for anxiety. 30 tablet 5  . escitalopram (LEXAPRO) 5 MG tablet Take 1 tablet (5 mg total) by mouth daily. (Patient not taking: Reported on 02/23/2019) 30 tablet 2  . clonazePAM (KLONOPIN) 0.5 MG tablet Take 1 tablet (0.5 mg total) by mouth 2 (two) times daily as needed for anxiety. (Patient not taking: Reported on 01/19/2019) 60 tablet 2   No facility-administered medications prior to visit.     Review of Systems   Patient denies headache, fevers, malaise, skin rash, eye pain, sinus  congestion and sinus pain, sore throat, dysphagia,  hemoptysis , cough, dyspnea, wheezing, chest pain, palpitations, orthopnea, edema, abdominal pain, nausea, melena, diarrhea, constipation, flank pain, dysuria, hematuria, urinary  Frequency, nocturia, numbness, tingling, seizures,  Focal weakness, Loss of consciousness,   insomnia, and suicidal ideation.      Objective:  BP 110/80 (BP Location: Left Arm, Patient Position: Sitting, Cuff Size: Normal)   Pulse 95   Temp 97.8 F (36.6 C) (Oral)   Resp 15   Ht 5' 7.5" (1.715 m)   Wt 96 lb 12.8 oz (43.9 kg)   SpO2 97%   BMI 14.94 kg/m   Physical Exam   General Appearance:    Alert, cooperative, no distress, CACHECTIC , appears stated age  Head:    Normocephalic, without obvious abnormality, atraumatic  Eyes:    PERRL, conjunctiva/corneas clear, EOM's intact, fundi    benign, both eyes  Ears:    Normal TM's and external ear canals, both ears  Nose:   Nares normal, septum midline, mucosa normal, no drainage    or sinus tenderness  Throat:   Lips, mucosa, and tongue normal; teeth and gums normal  Neck:   Supple, symmetrical, trachea midline, no adenopathy;    thyroid:  no enlargement/tenderness/nodules; no carotid   bruit or JVD  Back:     Symmetric, no  curvature, ROM normal, no CVA tenderness  Lungs:     Clear to auscultation bilaterally, respirations unlabored  Chest Wall:    No tenderness or deformity   Heart:    Regular rate and rhythm, S1 and S2 normal, no murmur, rub   or gallop  Breast Exam:    No tenderness, masses, or nipple abnormality  Abdomen:     Soft, non-tender, bowel sounds active all four quadrants,    no masses, no organomegaly  Genitalia:    Pelvic: cervix normal in appearance, external genitalia normal, no adnexal masses or tenderness, no cervical motion tenderness, rectovaginal septum normal, uterus normal size, shape, and consistency and vagina normal without discharge  Extremities:   Extremities normal,  atraumatic, no cyanosis or edema  Pulses:   2+ and symmetric all extremities  Skin:   Skin color, texture, turgor normal, no rashes or lesions  Lymph nodes:   Cervical, supraclavicular, and axillary nodes normal  Neurologic:   CNII-XII intact, normal strength, sensation and reflexes    throughout     Assessment & Plan:   Problem List Items Addressed This Visit    Iatrogenic hypothyroidism - Primary   Relevant Orders   TSH (Completed)   T4, free (Completed)   Screening for cervical cancer   Relevant Orders   Cytology - PAP( [place])   Screening for breast cancer   Relevant Orders   MM 3D SCREEN BREAST BILATERAL   Hyperlipidemia   Relevant Orders   Lipid panel (Completed)   Encounter for general adult medical examination with abnormal findings    age appropriate education and counseling updated, referrals for preventative services and immunizations addressed, dietary and smoking counseling addressed, most recent labs reviewed.  I have personally reviewed and have noted:  1) the patient's medical and social history 2) The pt's use of alcohol, tobacco, and illicit drugs 3) The patient's current medications and supplements 4) Functional ability including ADL's, fall risk, home safety risk, hearing and visual impairment 5) Diet and physical activities 6) Evidence for depression or mood disorder 7) The patient's height, weight, and BMI have been recorded in the chart  I have made referrals, and provided counseling and education based on review of the above  Mammogram ordered. PAP smear done. Colonoscopy postponed due to COVID 19 EPIDEMIC.      Hereditary hemochromatosis ([place])    ETIOLOGY CONFIRMED WITH GENETIC TESTING.  Hematology managing with monthly surveillance and therapeutic phlebotomy.  Aggravated by ongoing tobacco abuse .  REPEAT iron studies are pending   Lab Results  Component Value Date   WBC 10.3 10/16/2018   HGB 13.9 10/16/2018   HCT 38.3 10/16/2018   MCV  106.1 (H) 10/16/2018   PLT 187 10/16/2018   Lab Results  Component Value Date   IRON 230 (H) 10/16/2018   TIBC 283 10/16/2018   FERRITIN 1,240 (H) 94/80/1655        Alcoholic fatty liver    Suggested by last CT, with HH addressed and alcohol abuse ongoing .  LFTS remain  elevated in a 3:1 AST:ALT ratio  Lab Results  Component Value Date   ALT 56 (H) 02/23/2019   AST 189 (H) 02/23/2019   ALKPHOS 211 (H) 02/23/2019   BILITOT 1.3 (H) 02/23/2019         Anxiety as acute reaction to exceptional stress    Alprazolam refilled  For thirty days , as she has been using it regularly for insomnia.  Relevant Medications   ALPRAZolam (XANAX) 1 MG tablet   Alcoholism /alcohol abuse ([place])    Addressed today,  Abuse is ongoing and she as alienated family and friends.    A total of 40 minutes was spent with patient more than half of which was spent in counseling patient on the her need to enroll in a voluntary alcohol dependence and reab program.  [facility] [place].       Protein-calorie malnutrition ([place])    Secondary to alcoholism and ongoing alcohol abuse. A total of 40 minutes was spent with patient more than half of which was spent in counseling patient on the above mentioned issues , reviewing and explaining recent labs and imaging studies done, and coordination of care.       Other Visit Diagnoses    Chronic active hepatitis ([place])       Relevant Orders   Comprehensive metabolic panel (Completed)   Alpha-1-antitrypsin   ANA w/Reflex if Positive   Anti-Smith antibody   Ceruloplasmin   Mitochondrial antibodies   Anti-smooth muscle antibody, IgG   HCV RNA quant      I have discontinued Silvana R. Rolon's clonazePAM. I am also having her maintain her multivitamin with minerals, [place], Tolak, terbinafine, rosuvastatin, clopidogrel, escitalopram, levothyroxine, and ALPRAZolam.  Meds ordered this encounter  Medications  .  ALPRAZolam (XANAX) 1 MG tablet    Sig: Take 1 tablet (1 mg total) by mouth at bedtime as needed for anxiety.    Dispense:  30 tablet    Refill:  0    Medications Discontinued During This Encounter  Medication Reason  . clonazePAM (KLONOPIN) 0.5 MG tablet Patient has not taken in last 30 days  . ALPRAZolam (XANAX) 1 MG tablet     Follow-up: No follow-ups on file.   [name], MD

## 2019-02-23 NOTE — Patient Instructions (Addendum)
The most important health issue you have right now is your alcoholism  I will write a letter of support for whatever program you choose to enter.  Consider [facility] in [place]

## 2019-02-24 DIAGNOSIS — E46 Unspecified protein-calorie malnutrition: Secondary | ICD-10-CM | POA: Insufficient documentation

## 2019-02-24 NOTE — Assessment & Plan Note (Signed)
ETIOLOGY CONFIRMED WITH GENETIC TESTING.  Hematology managing with monthly surveillance and therapeutic phlebotomy.  Aggravated by ongoing tobacco abuse .  REPEAT iron studies are pending   Lab Results  Component Value Date   WBC 10.3 10/16/2018   HGB 13.9 10/16/2018   HCT 38.3 10/16/2018   MCV 106.1 (H) 10/16/2018   PLT 187 10/16/2018   Lab Results  Component Value Date   IRON 230 (H) 10/16/2018   TIBC 283 10/16/2018   FERRITIN 1,240 (H) 10/16/2018

## 2019-02-24 NOTE — Assessment & Plan Note (Signed)
Secondary to alcoholism and ongoing alcohol abuse. A total of 40 minutes was spent with patient more than half of which was spent in counseling patient on the above mentioned issues , reviewing and explaining recent labs and imaging studies done, and coordination of care.

## 2019-02-24 NOTE — Assessment & Plan Note (Signed)
Alprazolam refilled  For thirty days , as she has been using it regularly for insomnia.

## 2019-02-24 NOTE — Assessment & Plan Note (Addendum)
age appropriate education and counseling updated, referrals for preventative services and immunizations addressed, dietary and smoking counseling addressed, most recent labs reviewed.  I have personally reviewed and have noted:  1) the patient's medical and social history 2) The pt's use of alcohol, tobacco, and illicit drugs 3) The patient's current medications and supplements 4) Functional ability including ADL's, fall risk, home safety risk, hearing and visual impairment 5) Diet and physical activities 6) Evidence for depression or mood disorder 7) The patient's height, weight, and BMI have been recorded in the chart  I have made referrals, and provided counseling and education based on review of the above  Mammogram ordered. PAP smear done. Colonoscopy postponed due to COVID 19 EPIDEMIC.

## 2019-02-24 NOTE — Assessment & Plan Note (Signed)
Suggested by last CT, with HH addressed and alcohol abuse ongoing .  LFTS remain  elevated in a 3:1 AST:ALT ratio  Lab Results  Component Value Date   ALT 56 (H) 02/23/2019   AST 189 (H) 02/23/2019   ALKPHOS 211 (H) 02/23/2019   BILITOT 1.3 (H) 02/23/2019

## 2019-02-25 LAB — ETHANOL: Ethanol: 0.165 %

## 2019-02-25 LAB — CYTOLOGY - PAP
Diagnosis: NEGATIVE
HPV: NOT DETECTED

## 2019-02-27 ENCOUNTER — Encounter: Payer: Self-pay | Admitting: Internal Medicine

## 2019-02-27 ENCOUNTER — Telehealth: Payer: Self-pay | Admitting: Gastroenterology

## 2019-02-27 ENCOUNTER — Ambulatory Visit (INDEPENDENT_AMBULATORY_CARE_PROVIDER_SITE_OTHER): Payer: BC Managed Care – PPO | Admitting: Internal Medicine

## 2019-02-27 VITALS — Wt 97.0 lb

## 2019-02-27 DIAGNOSIS — E032 Hypothyroidism due to medicaments and other exogenous substances: Secondary | ICD-10-CM

## 2019-02-27 DIAGNOSIS — E278 Other specified disorders of adrenal gland: Secondary | ICD-10-CM

## 2019-02-27 MED ORDER — LEVOTHYROXINE SODIUM 125 MCG PO TABS: 125.0000 ug | ORAL_TABLET | Freq: Every day | ORAL | 3 refills | Status: DC

## 2019-02-27 NOTE — Progress Notes (Signed)
Patient ID: Tracie Morris, female   DOB: 1956/08/28, 63 y.o.   MRN: [id number]  Patient location: Home My location: Office  Referring Provider: [name], MD  I connected with the patient on 02/27/19 at  12:56 PM EDT by phone and verified that I am speaking with the correct person.   I discussed the limitations of evaluation and management by phone and the availability of in person appointments. The patient expressed understanding and agreed to proceed.   Details of the encounter are shown below.  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 Last visit 1 year ago.  At last visit, she had a lot of stress: husband diagnosed with ESRD, son was getting married, she also had stress at work.  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  Reviewed and addended history: Pt's adrenal mass was incidentally found during investigation for kidney stones in 01/2015.  Abdominal CT from 06/2015: Left adrenal nodule of 9 mm measuring -11 HU.  PET scan on 08/02/2015 and this nodule was not hypermetabolic.  Chest CT (05/21/2016): I have discussed with the radiologist that read the original CT scan (Dr. Ardeen Garland) >> her adrenal adenoma appears stable compared with a CT scan obtained in 2016.  Chest CT (02/13/2018): There is a stable adenoma in the left adrenal measuring 1.3 x 1.2 cm.  No h/o hypokalemia, HTN, hyperglycemia, spells of HA + HTN. She does have a h/o migraines, relieved by prn Plavix (!).   Reviewed previous investigations along with the patient ns along with pt: urine metanephrines, normetanephrines and catecholamines were normal.  However, at last check, normetanephrine and total metanephrines were slightly elevated.  This is a nonspecific finding so we did not repeat the labs at that time.    Plasma renin activity and aldosterone levels have been  normal.  Dexamethasone suppression tests were normal in the past:  Reviewed latest pertinent labs:  She had mild elevation in normetanephrine's, which is nonspecific. Component     Latest Ref Rng & Units 02/24/2018  Epinephrine     pg/mL 31  Norepinephrine     pg/mL 697  Dopamine     pg/mL 30  Catecholamines, Total     pg/mL 728  Metanephrine, Pl     <=57 pg/mL <25  Normetanephrine, Pl     <=148 pg/mL 197 (H)  Total Metanephrines-Plasma     <=205 pg/mL 197  ALDOSTERONE      ng/dL 5  Renin Activity     0.25 - 5.82 ng/mL/h 0.44  ALDO / PRA Ratio     0.9 - 28.9 Ratio 11.4  Potassium     3.5 - 5.1 mEq/L 3.5   Component     Latest Ref Rng & Units 06/09/2018  Cortisol - AM     mcg/dL 2.6 (L)    Component     Latest Ref Rng & Units 02/18/2017          Epinephrine      <LLN  Norepinephrine     pg/mL 558  Dopamine      <LLN  Catecholamines, Total     pg/mL 558  PRA LC/MS/MS     0.25 - 5.82 ng/mL/h 0.33  ALDO / PRA Ratio     0.9 - 28.9 Ratio 9.1  ALDOSTERONE     ng/dL 3  Metanephrine, Pl     <=  57 pg/mL 46  Normetanephrine, Pl     <=148 pg/mL 170 (H)  Total Metanephrines-Plasma     <=205 pg/mL 216 (H)   Component     Latest Ref Rng & Units 06/04/2017  Cortisol - AM     mcg/dL 2.9 (L)   Prev: Component     Latest Ref Rng 08/23/2015 08/31/2015 01/16/2016 02/06/2016  Total Volume - CF 24Hr U         1400  Epinephrine, 24 hr Urine         REPORT  Norepinephrine, 24 hr Ur     15 - 100 mcg/24 h    19  Calculated Total (E+NE)     26 - 121 mcg/24 h    19 (L)  Dopamine, 24 hr Urine     52 - 480 mcg/24 h    116  Creatinine, Urine mg/day-CATEUR     0.63 - 2.50 g/24 h    0.65  Epinephrine      REPORT     Norepinephrine      534     Dopamine      REPORT     Catecholamines, Total      534     PRA LC/MS/MS     0.25 - 5.82 ng/mL/h 0.47  1.63   ALDO / PRA Ratio     0.9 - 28.9 Ratio 6.4  6.7   ALDOSTERONE      3  11   Metanephrine, Pl     <=57 pg/mL 38      Normetanephrine, Pl     <=148 pg/mL 151 (H)     Total Metanephrines-Plasma     <=205 pg/mL 189     Metanephrines, Ur     90 - 315 mcg/24 h    51 (L)  Normetanephr.,U,24h     122 - 676 mcg/24 h    128  Metanephrine, Ur     224 - 832 mcg/24 h    179 (L)  Potassium     3.5 - 5.1 mEq/L 4.0     Cortisol, Plasma       1.2 9.0    Postsurgical hypothyroidism: Patient had total thyroidectomy in 2007 for presumed thyroid cancer - Dr Carlis Abbott (ENT); 1 parathyroid removed. Final pathology after thyroidectomy >> benign.   Pt is on levothyroxine 125 mcg daily, taken: - in am - fasting - at least 30 min from b'fast - no Ca, Fe, PPIs - + MVIs later in the day - not on Biotin But she forgot doses...  Her TFTs are fluctuating, her most recent TSH was high, despite the fact that her levothyroxine dose was not changed: Lab Results  Component Value Date   TSH 10.06 (H) 02/23/2019   TSH 2.97 12/22/2018   TSH 21.36 (H) 10/16/2018   TSH 4.90 (H) 06/02/2018   TSH 6.36 (H) 02/03/2018   TSH 1.28 07/22/2017   FREET4 0.77 02/23/2019   FREET4 1.19 12/22/2018   FREET4 1.0 10/16/2018   FREET4 1.14 02/03/2018   FREET4 1.06 07/22/2017   FREET4 1.40 06/04/2017   She has hereditary hemochromatosis.   ROS: Constitutional: no weight gain/no weight loss, no fatigue, no subjective hyperthermia, no subjective hypothermia, + decreased appetite Eyes: no blurry vision, no xerophthalmia ENT: no sore throat, no nodules palpated in neck, no dysphagia, no odynophagia, no hoarseness Cardiovascular: no CP/no SOB/no palpitations/no leg swelling Respiratory: no cough/no SOB/no wheezing Gastrointestinal: no N/no V/no D/no C/no acid reflux Musculoskeletal: no muscle aches/no  joint aches Skin: no rashes, no hair loss Neurological: + R hand tremors/no numbness/no tingling/no dizziness  I reviewed pt's medications, allergies, PMH, social hx, family hx, and changes were documented in the history of present illness.  Otherwise, unchanged from my initial visit note.  Past Medical History:  Diagnosis Date  . Carotid artery occlusion   . Hyperlipidemia   . Hypocalcemia   . hypothyroidism 2007   acquired, secondary to surgery for thyroid Ca  . Migraine    improved since thyroid surgery, 2 years  . multinodular goiter 2007   s/p thyroidectomy  . 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 Repeat angiography in November of 2014 showed 50% distal aortic stenosis extending into the left common iliac artery which had 60-70% ostial stenosis. No other significant disease. I placed on balloon expandable stent into  left common iliac artery extending slightly into the distal aorta   Past Surgical History:  Procedure Laterality Date  . ABDOMINAL AORTAGRAM N/A 10/14/2013   Procedure: ABDOMINAL [name];  Surgeon: Wellington Hampshire, MD;  Location: [facility];  Service: Cardiovascular;  Laterality: N/A;  . ANGIOPLASTY ILLIAC ARTERY    . CESAREAN SECTION    . NOSE SURGERY    . PERIPHERAL VASCULAR CATHETERIZATION N/A 11/14/2016   Procedure: Abdominal Aortogram;  Surgeon: Wellington Hampshire, MD;  Location: [facility];  Service: Cardiovascular;  Laterality: N/A;  . PERIPHERAL VASCULAR CATHETERIZATION Bilateral 11/14/2016   Procedure: Lower Extremity Angiography;  Surgeon: Wellington Hampshire, MD;  Location: [facility];  Service: Cardiovascular;  Laterality: Bilateral;  limited iliacs only  . PERIPHERAL VASCULAR CATHETERIZATION Left 11/14/2016   Procedure: Peripheral Vascular Intervention;  Surgeon: Wellington Hampshire, MD;  Location: [facility];  Service: Cardiovascular;  Laterality: Left;  common iliac  . SKIN CANCER EXCISION    . THYROID SURGERY  2007   secondary to thyroid cancer   Social History   Social History  . Marital Status: Married    Spouse Name: N/A  . Number of Children: 1    Occupational History  . Manager    Social History Main Topics  . Smoking status: Former Smoker -- 1.00 packs/day for 20 years    Types: Cigarettes    Quit date: 10/28/2011  . Smokeless tobacco: Never Used  . Alcohol Use: No  . Drug Use: No   Current Outpatient Medications on File Prior to Visit  Medication Sig Dispense Refill  . ALPRAZolam (XANAX) 1 MG tablet Take 1 tablet (1 mg total) by mouth at bedtime as needed for anxiety. 30 tablet 0  . clopidogrel (PLAVIX) 75 MG tablet TAKE ONE (1) TABLET BY MOUTH EVERY DAY 90 tablet 1  . levothyroxine (SYNTHROID, LEVOTHROID) 125 MCG tablet Take 1 tablet (125 mcg total) by mouth daily before breakfast. 90 tablet 0  . Multiple Vitamin (MULTIVITAMIN WITH MINERALS) TABS tablet Take 1 tablet by mouth daily.    Marland Kitchen PICATO 0.05 % GEL     . rosuvastatin (CRESTOR) 10 MG tablet Take 1 tablet (10 mg total) by mouth daily. 90 tablet 1  . TOLAK 4 % CREA     . escitalopram (LEXAPRO) 5 MG tablet Take 1 tablet (5 mg total) by mouth daily. (Patient not taking: Reported on 02/23/2019) 30 tablet 2  . terbinafine (LAMISIL) 1 % cream Apply 1 application topically 2 (two) times daily. (Patient not taking: Reported  on 02/27/2019) 30 g 0   No current facility-administered medications on file prior to visit.    Allergies  Allergen Reactions  . Atorvastatin Other (See Comments)    Myalgias   . Darvocet [Propoxyphene N-Acetaminophen] Nausea And Vomiting  . Demerol Nausea And Vomiting  . Methocarbamol Other (See Comments)    UNKNOWN  . Mirtazapine     Sleeplessness, nausea   . Primidone     Severe insomnia, myoclonus and parathesias   . Ambien [Zolpidem Tartrate] Palpitations    Anxiety , trembling.   Family History  Problem Relation Age of Onset  . Alzheimer's disease Father   . Stroke Mother   . Coronary artery disease Maternal Grandmother   . Heart disease Maternal Grandmother 70  . Coronary artery disease Paternal Grandmother 30  . Heart disease  Paternal Grandmother 56   PE: Wt 97 lb (44 kg)   BMI 14.97 kg/m  Body mass index is 14.97 kg/m. Wt Readings from Last 3 Encounters:  02/27/19 97 lb (44 kg)  02/23/19 96 lb 12.8 oz (43.9 kg)  01/19/19 102 lb (46.3 kg)   Constitutional:  in NAD  The physical exam was not performed (phone visit).  ASSESSMENT: 1. Adrenal incidentaloma  2. Postsurgical hypothyroidism  3. Presumed ThyCA I received the report of her thyroid pathology from [facility], from 02/06/2006: - Total thyroidectomy: A. Right thyroid: Nodular hyperplasia B. Left thyroid: Nodular hyperplasia with 1.9 cm dominant adenomatoid nodule. Parathyroid tissue in a single section, measuring 0.7 mm. Conclusion: There is nodular hyperplasia (multinodular goiter). There is no evidence of malignancy.   PLAN:  1. Patient with a left ~1 cm adrenal nodule discovered incidentally during work-up for kidney stones -Previous hormonal work-up and imaging indicated benign, nonsecreting nodule.  She has slightly elevated normetanephrine's in the past, which is nonspecific.  Her dexamethasone suppression test were also normal, but she did not return to have one last year.  Her aldosterone and plasma renin activity were normal. -She is now due for the last set of adrenal hormones: - dexamethasone suppression test to rule out Cushing syndrome (6% of adrenal incidentalomas). If the cortisol level returns >5, will need 24h urine free cortisol.  She had 4 suppressed free cortisol level so far.  - Plasma fractionated metanephrines and catecholamines to rule out pheochromocytoma (3% of adrenal incidentalomas).   - Plasma renin activity and aldosterone level to rule out primary hyperaldosteronism (0.6% of adrenal incidentalomas).  -We discussed about follow-up:  Hormonal testing yearly for 5 years-now due for the last set. We will repeat when safe to come to the clinic  CT scans yearly x1-2 - had a chest CT that includes  the adrenal >> I have discussed with the radiologist that read the original CT scan (Dr. Ardeen Garland) >> her adrenal adenoma appears stable compared with a CT scan obtained in 2016.  She had another CT scan in 2019 which showed a stable adenoma.  We do not need to repeat another CT scan.  2. Hypothyroidism - Patient has had uncontrolled hypothyroidism with fluctuating TFTs - She had a suppressed TSH in the past but this was checked after she took dexamethasone the night before, so it should be ignored - latest thyroid labs reviewed with pt >> high 4 days ago, despite not changing her levothyroxine dose recently - she continues on LT4 125 mcg daily - pt feels good on this dose. - we discussed about taking the thyroid hormone every day, with water, >30  minutes before breakfast, separated by >4 hours from acid reflux medications, calcium, iron, multivitamins. Pt. is taking it correctly after we moved the multivitamins later in the day. - will check thyroid tests in approximately 2 months: TSH and fT4  - time spent with the patient: 14 min, of which >50% was spent in obtaining information about her symptoms, reviewing her previous labs, evaluations, and treatments, counseling her about her conditions (please see the discussed topics above), and developing a plan to further investigate and treat it; she had a number of questions which I addressed.   Philemon Kingdom, MD PhD [facility] Endocrinology

## 2019-02-27 NOTE — Patient Instructions (Signed)
Please continue Levothyroxine 125 mcg daily.  Take the thyroid hormone every day, with water, at least 30 minutes before breakfast, separated by at least 4 hours from: - acid reflux medications - calcium - iron - multivitamins  Please come back for labs in 2 months.  Please come back for a follow-up appointment in 6 months.

## 2019-02-27 NOTE — Telephone Encounter (Signed)
Spoke with pt to schedule televisit apt per  Dr. Bonna Gains pt states she is not sure if she can do a Video apt I offered her a televisit instead pt states she is getting ready to go into rehab for Alcohol and is not sure when she can do this apt offered her several dates that did not work for pt  Pt is now scheduled for 04/06/19

## 2019-02-28 ENCOUNTER — Other Ambulatory Visit: Payer: Self-pay

## 2019-02-28 ENCOUNTER — Encounter: Payer: Self-pay | Admitting: Emergency Medicine

## 2019-02-28 ENCOUNTER — Emergency Department
Admission: EM | Admit: 2019-02-28 | Discharge: 2019-03-01 | Disposition: A | Discharge status: Home / Self Care | Payer: BC Managed Care – PPO | Attending: Emergency Medicine | Admitting: Emergency Medicine

## 2019-02-28 DIAGNOSIS — Z79899 Other long term (current) drug therapy: Secondary | ICD-10-CM | POA: Diagnosis not present

## 2019-02-28 DIAGNOSIS — Z85828 Personal history of other malignant neoplasm of skin: Secondary | ICD-10-CM | POA: Diagnosis not present

## 2019-02-28 DIAGNOSIS — F1099 Alcohol use, unspecified with unspecified alcohol-induced disorder: Secondary | ICD-10-CM | POA: Insufficient documentation

## 2019-02-28 DIAGNOSIS — F1010 Alcohol abuse, uncomplicated: Secondary | ICD-10-CM

## 2019-02-28 DIAGNOSIS — F17210 Nicotine dependence, cigarettes, uncomplicated: Secondary | ICD-10-CM | POA: Diagnosis not present

## 2019-02-28 LAB — CBC WITH DIFFERENTIAL/PLATELET
Abs Immature Granulocytes: 0.06 10*3/uL (ref 0.00–0.07)
Basophils Absolute: 0.1 10*3/uL (ref 0.0–0.1)
Basophils Relative: 1 %
Eosinophils Absolute: 0 10*3/uL (ref 0.0–0.5)
Eosinophils Relative: 1 %
HCT: 41.4 % (ref 36.0–46.0)
Hemoglobin: 14.7 g/dL (ref 12.0–15.0)
Immature Granulocytes: 1 %
Lymphocytes Relative: 39 %
Lymphs Abs: 3 10*3/uL (ref 0.7–4.0)
MCH: 38.7 pg — ABNORMAL HIGH (ref 26.0–34.0)
MCHC: 35.5 g/dL (ref 30.0–36.0)
MCV: 108.9 fL — ABNORMAL HIGH (ref 80.0–100.0)
Monocytes Absolute: 0.4 10*3/uL (ref 0.1–1.0)
Monocytes Relative: 5 %
Neutro Abs: 4.1 10*3/uL (ref 1.7–7.7)
Neutrophils Relative %: 53 %
Platelets: 103 10*3/uL — ABNORMAL LOW (ref 150–400)
RBC: 3.8 MIL/uL — ABNORMAL LOW (ref 3.87–5.11)
RDW: 12.3 % (ref 11.5–15.5)
WBC: 7.6 10*3/uL (ref 4.0–10.5)
nRBC: 0 % (ref 0.0–0.2)

## 2019-02-28 LAB — BASIC METABOLIC PANEL
Anion gap: 16 — ABNORMAL HIGH (ref 5–15)
BUN: 9 mg/dL (ref 8–23)
CO2: 24 mmol/L (ref 22–32)
Calcium: 9 mg/dL (ref 8.9–10.3)
Chloride: 97 mmol/L — ABNORMAL LOW (ref 98–111)
Creatinine, Ser: 0.64 mg/dL (ref 0.44–1.00)
GFR calc Af Amer: >60 mL/min (ref >60)
GFR calc non Af Amer: >60 mL/min (ref >60)
Glucose, Bld: 91 mg/dL (ref 70–99)
Potassium: 3.3 mmol/L — ABNORMAL LOW (ref 3.5–5.1)
Sodium: 137 mmol/L (ref 135–145)

## 2019-02-28 LAB — HEPATIC FUNCTION PANEL
ALT: 81 U/L — ABNORMAL HIGH (ref 0–44)
AST: 308 U/L — ABNORMAL HIGH (ref 15–41)
Albumin: 4.4 g/dL (ref 3.5–5.0)
Alkaline Phosphatase: 241 U/L — ABNORMAL HIGH (ref 38–126)
Bilirubin, Direct: 1 mg/dL — ABNORMAL HIGH (ref 0.0–0.2)
Indirect Bilirubin: 1 mg/dL — ABNORMAL HIGH (ref 0.3–0.9)
Total Bilirubin: 2 mg/dL — ABNORMAL HIGH (ref 0.3–1.2)
Total Protein: 7.8 g/dL (ref 6.5–8.1)

## 2019-02-28 LAB — ETHANOL: Alcohol, Ethyl (B): 191 mg/dL — ABNORMAL HIGH (ref <10)

## 2019-02-28 MED ORDER — SODIUM CHLORIDE 0.9 % IV BOLUS
1000.0000 mL | Freq: Once | INTRAVENOUS | Status: AC
  Administered 2019-02-28: 1000 mL via INTRAVENOUS

## 2019-02-28 MED ORDER — LORAZEPAM 2 MG PO TABS
2.0000 mg | ORAL_TABLET | Freq: Once | ORAL | Status: AC
  Administered 2019-02-28: 21:00:00 2 mg via ORAL
  Filled 2019-02-28: qty 1

## 2019-02-28 MED ORDER — LORAZEPAM 2 MG PO TABS
2.0000 mg | ORAL_TABLET | Freq: Once | ORAL | Status: AC
  Administered 2019-02-28: 2 mg via ORAL
  Filled 2019-02-28: qty 1

## 2019-02-28 NOTE — ED Notes (Signed)
Pt out at nurses station wanting her IV bag "taken off" NS complete so this tech disconnected IV bag and pt wants to know "what is going on?Why am I still here? And why has noone came in here to let me know what the hell is going on? This tech explained to pt that the RN taking care of her was in an emergency traffic but I would let him know that she wished to speak with him.........  Calvin TTS has recently been in pt room and spoke with her regarding going to [facility] for detox treatment, pt advised me that she wanted to know how much it cost, when she would be going, etc this tech attempting to get in contact with Calvin at this time

## 2019-02-28 NOTE — ED Notes (Signed)
This tech in to speak with pt and advised her that I did make a phone call to [facility] due to her RN being tied up with an emergency, this writer reassured pt that she was being sent to [facility] to detox treatment and that she would be going today however, I could not give her a exact time frime. Pt states "that is not what we talked about and I don't see it happening that I'm going today"...... this tech advised pt that I was unable to answer these questions and I would have her nurse come in as soon as possible reminding pt that he was tied up in a emergency traffic, RN Chiropodist notified

## 2019-02-28 NOTE — ED Provider Notes (Signed)
[facility] Emergency Department Provider Note ____________________________________________   First MD Initiated Contact with Patient 02/28/19 1537     (approximate)  I have reviewed the triage vital signs and the nursing notes.   HISTORY  Chief Complaint alcohol detox    HPI Tracie Morris is a 63 y.o. female with PMH as noted below presents with alcohol abuse, requesting detox.  The patient states that she is afraid she will die due to her alcohol abuse and also cannot get treatment for some of her medical problems while she is actively drinking.  She reports drinking numerous shots of liquor daily although she cannot give me an exact amount.  Her last drink was just prior to coming to the hospital.  She denies depression, SI or HI, or other acute psychiatric symptoms.  She denies any other acute medical symptoms.   Past Medical History:  Diagnosis Date  . Carotid artery occlusion   . Hyperlipidemia   . Hypocalcemia   . hypothyroidism 2007   acquired, secondary to surgery for thyroid Ca  . Migraine    improved since thyroid surgery, 2 years  . multinodular goiter 2007   s/p thyroidectomy  . 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 Repeat angiography in November of 2014 showed 50% distal aortic stenosis extending into the left common iliac artery which had 60-70% ostial stenosis. No other significant disease. I placed on balloon expandable stent into  left common iliac artery extending slightly into the distal aorta    Patient Active Problem List   Diagnosis Date Noted  . Protein-calorie malnutrition ([place]) 02/24/2019  . Alcoholism /alcohol abuse ([place]) 10/30/2018  . Anxiety as acute reaction to exceptional stress 08/18/2018  . Alcoholic fatty liver 84/13/2440  . Tobacco abuse counseling 02/04/2018  . Intention tremor 07/23/2017  .  Trochanteric bursitis of left hip 03/11/2017  . Hereditary hemochromatosis ([place]) 01/29/2017  . Coronary artery disease due to lipid rich plaque 01/01/2017  . Pain in toe 08/28/2016  . Tremulousness 01/17/2016  . GAD (generalized anxiety disorder) 01/17/2016  . Solitary pulmonary nodule 07/21/2015  . Adrenal mass, left ([place]) 07/21/2015  . Insomnia, persistent 02/01/2015  . Osteoarthritis cervical spine 02/01/2015  . Encounter for general adult medical examination with abnormal findings 07/13/2014  . S/P Nissen fundoplication (without gastrostomy tube) procedure 01/11/2014  . Peripheral arterial disease ([place])   . Post menopausal syndrome 07/07/2013  . Peripheral vascular disease, secondary ([place]) 05/16/2012  . Screening for cervical cancer 10/20/2011  . Screening for breast cancer 10/20/2011  . Screening for colon cancer 10/20/2011  . Iatrogenic hypothyroidism   . Hyperlipidemia   . Migraine     Past Surgical History:  Procedure Laterality Date  . ABDOMINAL AORTAGRAM N/A 10/14/2013   Procedure: ABDOMINAL [name];  Surgeon: Wellington Hampshire, MD;  Location: [place] CATH LAB;  Service: Cardiovascular;  Laterality: N/A;  . ANGIOPLASTY ILLIAC ARTERY    . CESAREAN SECTION    . NOSE SURGERY    . PERIPHERAL VASCULAR CATHETERIZATION N/A 11/14/2016   Procedure: Abdominal Aortogram;  Surgeon: Wellington Hampshire, MD;  Location: [place] CV LAB;  Service: Cardiovascular;  Laterality: N/A;  . PERIPHERAL VASCULAR CATHETERIZATION Bilateral 11/14/2016   Procedure: Lower Extremity Angiography;  Surgeon: Wellington Hampshire, MD;  Location: [place] CV LAB;  Service: Cardiovascular;  Laterality: Bilateral;  limited iliacs only  . PERIPHERAL VASCULAR CATHETERIZATION Left 11/14/2016  Procedure: Peripheral Vascular Intervention;  Surgeon: Wellington Hampshire, MD;  Location: [place] CV LAB;  Service: Cardiovascular;  Laterality: Left;  common iliac  . SKIN CANCER EXCISION    . THYROID SURGERY  2007    secondary to thyroid cancer    Prior to Admission medications   Medication Sig Start Date End Date Taking? Authorizing Provider  ALPRAZolam Duanne Moron) 1 MG tablet Take 1 tablet (1 mg total) by mouth at bedtime as needed for anxiety. 02/23/19   [name], MD  clopidogrel (PLAVIX) 75 MG tablet TAKE ONE (1) TABLET BY MOUTH EVERY DAY 09/23/18   Wellington Hampshire, MD  escitalopram (LEXAPRO) 5 MG tablet Take 1 tablet (5 mg total) by mouth daily. Patient not taking: Reported on 02/23/2019 10/29/18   [name], MD  levothyroxine (SYNTHROID, LEVOTHROID) 125 MCG tablet Take 1 tablet (125 mcg total) by mouth daily before breakfast. 02/27/19   Philemon Kingdom, MD  Multiple Vitamin (MULTIVITAMIN WITH MINERALS) TABS tablet Take 1 tablet by mouth daily.    [provider]  PICATO 0.05 % GEL  08/05/18   [provider]  rosuvastatin (CRESTOR) 10 MG tablet Take 1 tablet (10 mg total) by mouth daily. 09/23/18   Wellington Hampshire, MD  terbinafine (LAMISIL) 1 % cream Apply 1 application topically 2 (two) times daily. Patient not taking: Reported on 02/27/2019 08/18/18   [name], MD  TOLAK 4 % CREA  08/04/18   [provider]    Allergies Atorvastatin; Darvocet [propoxyphene n-acetaminophen]; Demerol; Methocarbamol; Mirtazapine; Primidone; and Ambien [zolpidem tartrate]  Family History  Problem Relation Age of Onset  . Alzheimer's disease Father   . Stroke Mother   . Coronary artery disease Maternal Grandmother   . Heart disease Maternal Grandmother 70  . Coronary artery disease Paternal Grandmother 62  . Heart disease Paternal Grandmother 65    Social History Social History   Tobacco Use  . Smoking status: Current Every Day Smoker    Packs/day: 0.50    Years: 20.00    Pack years: 10.00    Types: Cigarettes    Last attempt to quit: 10/28/2011    Years since quitting: 7.3  . Smokeless tobacco: Never Used  Substance Use Topics  . Alcohol use: No  . Drug use: No     Review of Systems  Constitutional: No fever. Eyes: No redness. ENT: No sore throat. Cardiovascular: Denies chest pain. Respiratory: Denies shortness of breath. Gastrointestinal: No vomiting or diarrhea.  Genitourinary: Negative for dysuria.  Musculoskeletal: Negative for back pain. Skin: Negative for rash. Neurological: Negative for headache.   ____________________________________________   PHYSICAL EXAM:  VITAL SIGNS: ED Triage Vitals  Enc Vitals Group     BP 02/28/19 1527 (!) 144/73     Pulse Rate 02/28/19 1527 (!) 120     Resp 02/28/19 1527 16     Temp 02/28/19 1527 98.2 F (36.8 C)     Temp Source 02/28/19 1527 Oral     SpO2 02/28/19 1527 97 %     Weight 02/28/19 1528 97 lb (44 kg)     Height 02/28/19 1528 5\' 8"  (1.727 m)     Head Circumference --      Peak Flow --      Pain Score 02/28/19 1528 0     Pain Loc --      Pain Edu? --      Excl. in [place]? --     Constitutional: Alert and oriented.  Anxious appearing and tearful but  in no acute distress. Eyes: Conjunctivae are normal.  Head: Atraumatic. Nose: No congestion/rhinnorhea.  Mouth/Throat: Mucous membranes are somewhat dry.   Neck: Normal range of motion.  Cardiovascular: Normal rate, regular rhythm. Good peripheral circulation. Respiratory: Normal respiratory effort.  No retractions.  Gastrointestinal:  No distention.  Musculoskeletal:  Extremities warm and well perfused.  Neurologic:  Normal speech and language. No gross focal neurologic deficits are appreciated.  Skin:  Skin is warm and dry. No rash noted. Psychiatric: Mood and affect are normal. Speech and behavior are normal.  ____________________________________________   LABS (all labs ordered are listed, but only abnormal results are displayed)  Labs Reviewed  BASIC METABOLIC PANEL - Abnormal; Notable for the following components:      Result Value   Potassium 3.3 (*)    Chloride 97 (*)    Anion gap 16 (*)    All other components  within normal limits  CBC WITH DIFFERENTIAL/PLATELET - Abnormal; Notable for the following components:   RBC 3.80 (*)    MCV 108.9 (*)    MCH 38.7 (*)    Platelets 103 (*)    All other components within normal limits  HEPATIC FUNCTION PANEL - Abnormal; Notable for the following components:   AST 308 (*)    ALT 81 (*)    Alkaline Phosphatase 241 (*)    Total Bilirubin 2.0 (*)    Bilirubin, Direct 1.0 (*)    Indirect Bilirubin 1.0 (*)    All other components within normal limits  ETHANOL - Abnormal; Notable for the following components:   Alcohol, Ethyl (B) 191 (*)    All other components within normal limits  URINE DRUG SCREEN, QUALITATIVE ([facility] ONLY)   ____________________________________________  EKG  ED ECG REPORT I, Arta Silence, the attending physician, personally viewed and interpreted this ECG.  Date: 02/28/2019 EKG Time: 1532 Rate: 83 Rhythm: normal sinus rhythm QRS Axis: normal Intervals: normal ST/T Wave abnormalities: normal Narrative Interpretation: no evidence of acute ischemia  ____________________________________________  RADIOLOGY    ____________________________________________   PROCEDURES  Procedure(s) performed: No  Procedures  Critical Care performed: No ____________________________________________   INITIAL IMPRESSION / ASSESSMENT AND PLAN / ED COURSE  Pertinent labs & imaging results that were available during my care of the patient were reviewed by me and considered in my medical decision making (see chart for details).  63 year old female with PMH as noted above presents with alcohol abuse, requesting detox.  She denies other acute psychiatric or medical symptoms.  Her last drink was just prior to coming to the hospital.  The patient was tachycardic on arrival although her heart rate is now normal.  Her other vital signs are normal except for mild hypertension.  She has no tremor, tongue fasciculation, or active signs of  withdrawal.  We will obtain lab work-up, give fluids, and reassess.  Since the patient was drinking just until she came to the hospital, she is not displaying signs of withdrawal at this time but I anticipate that she will start to withdraw in several hours.  We will monitor and treat accordingly.  I have ordered TTS evaluation for detox.  ----------------------------------------- 10:43 PM on 02/28/2019 -----------------------------------------  Lab work-up reveals elevated LFTs and alkaline phosphatase which are chronic for the patient.  The remainder of the labs are relatively unremarkable.  The patient's vital signs are normal after fluids.  TTS evaluated the patient prior to 7 PM and is working on getting her to  a detox facility.  The patient stated that she was tired of waiting and was not sure what the plan was.  She initially stated that she wanted to leave.  However, after further explaining the process to her, she agreed with the plan and decided to stay.  At that time she appeared anxious and slightly tremulous so I gave her p.o. Ativan to stave off withdrawal symptoms.  She has remained comfortable.  Disposition is pending TTS.  I am signing the patient out to the oncoming physician.   ____________________________________________   FINAL CLINICAL IMPRESSION(S) / ED DIAGNOSES  Final diagnoses:  Alcohol abuse      NEW MEDICATIONS STARTED DURING THIS VISIT:  New Prescriptions   No medications on file     Note:  This document was prepared using Dragon voice recognition software and may include unintentional dictation errors.    Arta Silence, MD 02/28/19 2244

## 2019-02-28 NOTE — BH Assessment (Signed)
Assessment Note  Tracie Morris is an 63 y.o. female who presents to the ER seeking assistance with her alcohol use. She states she drinks approximately "16 air plane bottles a day." She states she drank this amount for approximately two years. She reports of having no history of withdrawals due to drinking consecutively for the two years. She's able to walk and complete her ADL's without assistance.  During the interview, the patient was calm, cooperative and pleasant. She denies SI/HI and AV/H.  She denies the use of any other mind-altering substance.   Diagnosis: Alcohol Use Disorder  Past Medical History:  Past Medical History:  Diagnosis Date  . Carotid artery occlusion   . Hyperlipidemia   . Hypocalcemia   . hypothyroidism 2007   acquired, secondary to surgery for thyroid Ca  . Migraine    improved since thyroid surgery, 2 years  . multinodular goiter 2007   s/p thyroidectomy  . 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 Repeat angiography in November of 2014 showed 50% distal aortic stenosis extending into the left common iliac artery which had 60-70% ostial stenosis. No other significant disease. I placed on balloon expandable stent into  left common iliac artery extending slightly into the distal aorta    Past Surgical History:  Procedure Laterality Date  . ABDOMINAL AORTAGRAM N/A 10/14/2013   Procedure: ABDOMINAL [name];  Surgeon: Wellington Hampshire, MD;  Location: [place] CATH LAB;  Service: Cardiovascular;  Laterality: N/A;  . ANGIOPLASTY ILLIAC ARTERY    . CESAREAN SECTION    . NOSE SURGERY    . PERIPHERAL VASCULAR CATHETERIZATION N/A 11/14/2016   Procedure: Abdominal Aortogram;  Surgeon: Wellington Hampshire, MD;  Location: [place] CV LAB;  Service: Cardiovascular;  Laterality: N/A;  . PERIPHERAL VASCULAR CATHETERIZATION Bilateral 11/14/2016   Procedure: Lower  Extremity Angiography;  Surgeon: Wellington Hampshire, MD;  Location: [place] CV LAB;  Service: Cardiovascular;  Laterality: Bilateral;  limited iliacs only  . PERIPHERAL VASCULAR CATHETERIZATION Left 11/14/2016   Procedure: Peripheral Vascular Intervention;  Surgeon: Wellington Hampshire, MD;  Location: [place] CV LAB;  Service: Cardiovascular;  Laterality: Left;  common iliac  . SKIN CANCER EXCISION    . THYROID SURGERY  2007   secondary to thyroid cancer    Family History:  Family History  Problem Relation Age of Onset  . Alzheimer's disease Father   . Stroke Mother   . Coronary artery disease Maternal Grandmother   . Heart disease Maternal Grandmother 70  . Coronary artery disease Paternal Grandmother 50  . Heart disease Paternal Grandmother 89    Social History:  reports that she has been smoking cigarettes. She has a 10.00 pack-year smoking history. She has never used smokeless tobacco. She reports that she does not drink alcohol or use drugs.  Additional Social History:  Alcohol / Drug Use Pain Medications: See PTA Prescriptions: See PTA Over the Counter: See PTA History of alcohol / drug use?: Yes Longest period of sobriety (when/how long): Unable to quantify Negative Consequences of Use: Legal, Personal relationships Substance #1 Name of Substance 1: Alcohol 1 - Age of First Use: 60 1 - Amount (size/oz): "16 to 18 airplane bottles" 1 - Frequency: Daily 1 - Duration: Two years 1 - Last Use / Amount: 02/28/2019  CIWA: CIWA-Ar BP: (!) 144/73 Pulse Rate: (!) 120 Nausea and Vomiting: no nausea and no vomiting Tactile  Disturbances: none Tremor: no tremor Auditory Disturbances: not present Paroxysmal Sweats: no sweat visible Visual Disturbances: not present Anxiety: no anxiety, at ease Headache, Fullness in Head: none present Agitation: normal activity Orientation and Clouding of Sensorium: oriented and can do serial additions CIWA-Ar Total: 0 COWS:    Allergies:   Allergies  Allergen Reactions  . Atorvastatin Other (See Comments)    Myalgias   . Darvocet [Propoxyphene N-Acetaminophen] Nausea And Vomiting  . Demerol Nausea And Vomiting  . Methocarbamol Other (See Comments)    UNKNOWN  . Mirtazapine     Sleeplessness, nausea   . Primidone     Severe insomnia, myoclonus and parathesias   . Ambien [Zolpidem Tartrate] Palpitations    Anxiety , trembling.    Home Medications: (Not in a hospital admission)   OB/GYN Status:  No LMP recorded. Patient is postmenopausal.  General Assessment Data Location of Assessment: [facility] ED TTS Assessment: In system Is this a Tele or Face-to-Face Assessment?: Face-to-Face Is this an Initial Assessment or a Re-assessment for this encounter?: Initial Assessment Patient Accompanied by:: N/A Language Other than English: No Living Arrangements: Other (Comment)(Private Home) What gender do you identify as?: Female Marital status: Married Pregnancy Status: No Living Arrangements: Spouse/significant other Can pt return to current living arrangement?: Yes Admission Status: Voluntary Is patient capable of signing voluntary admission?: Yes Referral Source: Self/Family/Friend Insurance type: Insurance risk surveyor Exam ([place]) Medical Exam completed: Yes  Crisis Care Plan Living Arrangements: Spouse/significant other Legal Guardian: Other:(Self) Name of Psychiatrist: Reports of none Name of Therapist: Reports of none  Education Status Is patient currently in school?: No Is the patient employed, unemployed or receiving disability?: Employed  Risk to self with the past 6 months Suicidal Ideation: No Has patient been a risk to self within the past 6 months prior to admission? : No Suicidal Intent: No Has patient had any suicidal intent within the past 6 months prior to admission? : No Is patient at risk for suicide?: No Suicidal Plan?: No Has patient had any suicidal plan within the past 6  months prior to admission? : No Access to Means: No What has been your use of drugs/alcohol within the last 12 months?: Alcohol Previous Attempts/Gestures: No How many times?: 0 Other Self Harm Risks: Reports of none Triggers for Past Attempts: None known Intentional Self Injurious Behavior: None Family Suicide History: No Recent stressful life event(s): Loss (Comment), Other (Comment), Turmoil (Comment) Persecutory voices/beliefs?: No Depression: Yes Depression Symptoms: Feeling worthless/self pity, Loss of interest in usual pleasures, Isolating Substance abuse history and/or treatment for substance abuse?: Yes Suicide prevention information given to non-admitted patients: Not applicable  Risk to Others within the past 6 months Homicidal Ideation: No Does patient have any lifetime risk of violence toward others beyond the six months prior to admission? : No Thoughts of Harm to Others: No Current Homicidal Intent: No Current Homicidal Plan: No Access to Homicidal Means: No Identified Victim: Reports of none History of harm to others?: No Assessment of Violence: None Noted Violent Behavior Description: Reports of none Does patient have access to weapons?: No Criminal Charges Pending?: No Does patient have a court date: No Is patient on probation?: No  Psychosis Hallucinations: None noted Delusions: None noted  Mental Status Report Appearance/Hygiene: Unremarkable, In scrubs Eye Contact: Fair Motor Activity: Freedom of movement, Unremarkable Speech: Logical/coherent, Unremarkable Level of Consciousness: Alert Mood: Depressed, Sad, Pleasant Affect: Appropriate to circumstance, Depressed Anxiety Level: Minimal Thought Processes: Coherent, Relevant Judgement:  Unimpaired Orientation: Person, Place, Time, Situation, Appropriate for developmental age Obsessive Compulsive Thoughts/Behaviors: Minimal  Cognitive Functioning Concentration: Normal Memory: Recent Intact, Remote  Intact Is patient IDD: No Insight: Fair Impulse Control: Fair Appetite: Fair Have you had any weight changes? : Loss Amount of the weight change? (lbs): 40 lbs Sleep: No Change Total Hours of Sleep: 8 Vegetative Symptoms: None  ADLScreening [facility] Assessment Services) Patient's cognitive ability adequate to safely complete daily activities?: Yes Patient able to express need for assistance with ADLs?: Yes Independently performs ADLs?: Yes (appropriate for developmental age)  Prior Inpatient Therapy Prior Inpatient Therapy: No  Prior Outpatient Therapy Prior Outpatient Therapy: No Does patient have an ACCT team?: No Does patient have Intensive In-House Services?  : No Does patient have [facility] services? : No Does patient have P4CC services?: No  ADL Screening (condition at time of admission) Patient's cognitive ability adequate to safely complete daily activities?: Yes Is the patient deaf or have difficulty hearing?: No Does the patient have difficulty seeing, even when wearing glasses/contacts?: No Does the patient have difficulty concentrating, remembering, or making decisions?: No Patient able to express need for assistance with ADLs?: Yes Does the patient have difficulty dressing or bathing?: No Independently performs ADLs?: Yes (appropriate for developmental age) Does the patient have difficulty walking or climbing stairs?: No Weakness of Legs: None Weakness of Arms/Hands: None  Home Assistive Devices/Equipment Home Assistive Devices/Equipment: None  Therapy Consults (therapy consults require a physician order) PT Evaluation Needed: No OT Evalulation Needed: No SLP Evaluation Needed: No Abuse/Neglect Assessment (Assessment to be complete while patient is alone) Abuse/Neglect Assessment Can Be Completed: Yes Physical Abuse: Denies Verbal Abuse: Denies Sexual Abuse: Denies Exploitation of patient/patient's resources: Denies Self-Neglect: Denies Values /  Beliefs Cultural Requests During Hospitalization: None Spiritual Requests During Hospitalization: None Consults Spiritual Care Consult Needed: No Social Work Consult Needed: No Regulatory affairs officer (For Healthcare) Does Patient Have a Medical Advance Directive?: No Would patient like information on creating a medical advance directive?: No - Patient declined       Child/Adolescent Assessment Running Away Risk: Denies(Patient is an adult)  Disposition:  Disposition Initial Assessment Completed for this Encounter: Yes  On Site Evaluation by:   Reviewed with Physician:    Gunnar Fusi MS, LCAS, [place], [place], CCSI Therapeutic Triage Specialist 02/28/2019 6:16 PM

## 2019-02-28 NOTE — BH Assessment (Signed)
Writer discussed with the patient about the option for detox/treatment with [facility].  Patient was in the agreement with the plan.  Patient signed the Release of information, in order to send labs and ER Note.

## 2019-02-28 NOTE — ED Triage Notes (Signed)
Pt to ED via POV requesting detox from ETOH. Pt states that she has been a daily drinker for the last several years. Pt states that she has never been through detox before, has never had seizures from lack of ETOH. Pt states that her last drink was today. Pt reports having 6-7 shots of vodka. Pt is tachycardiac and tearful, otherwise in NAD.

## 2019-03-01 NOTE — ED Notes (Signed)
Pt given meal tray at this time and repositioned in bed.

## 2019-03-01 NOTE — ED Notes (Signed)
Pt currently on phone with Adeeba from [place] to discuss possible screening for inpatient or outpatient assistance with detox.

## 2019-03-01 NOTE — ED Notes (Signed)
Attempted to contact adeeba with [facility] to discuss pt status, no answer, voicemail left with instructions to contact this RN

## 2019-03-01 NOTE — ED Notes (Signed)
Pt states she wishes to be discharged at this time. MD informed of situation. MD speaking to pt at this time

## 2019-03-01 NOTE — ED Notes (Signed)
Patient c/o being unsteady on feet upon arrival to shift and hourly rounding on patient. Patient scooting around room on stool. Patient asked multiple times to get back in bed and patient finally agreed. Patient helped back in bed and given call bell and covered with blanket. TV turned on

## 2019-03-01 NOTE — ED Notes (Signed)
Pt states she currently feels weak and unsteady on feet. Pt instructed by RN not to get up unassisted at this time to prevent injury. Call light on rail of bed within reach

## 2019-03-01 NOTE — BH Assessment (Signed)
Per the request of [facility], Writer refaxed the referral information.

## 2019-03-01 NOTE — ED Notes (Signed)
Pt medications retrieved from pharmacy at this time

## 2019-03-01 NOTE — ED Notes (Signed)
TTS contacted [facility] for second time to identify requested MAR was received and to gather further information on possible admission status for Ms. Tracie Morris.  TTS spoke with [place].  Intake at [facility] was unable to provide additional information at [facility] at this time.

## 2019-03-01 NOTE — ED Provider Notes (Signed)
-----------------------------------------   12:42 AM on 03/01/2019 -----------------------------------------   Blood pressure 130/78, pulse 78, temperature 98.2 F (36.8 C), temperature source Oral, resp. rate 17, height 5\' 8"  (1.727 m), weight 44 kg, SpO2 99 %.  The patient is calm and cooperative at this time.  There have been no acute events since the last update.  Awaiting disposition plan from Behavioral Medicine team.    Nena Polio, MD 03/01/19 [phone]

## 2019-03-01 NOTE — ED Notes (Signed)
Previous RN had patient medications on my arrival. Medications given to me and kept with me due to potential patient placement and voluntary status until sent to pharmacy at this time. Medication count completed with Lorrie RN.

## 2019-03-01 NOTE — ED Provider Notes (Signed)
Patient states she does not want to be here any longer.  She appears medically cleared for outpatient follow-up.   Earleen Newport, MD 03/01/19 [phone]

## 2019-03-01 NOTE — ED Notes (Signed)
When going over discharge/follow up instructions, this Rn attempted to provide pt follow up information regarding [facility]. Pt refused to take information, states she does not need/want information.  Pt transported to lobby via wheelchair with all pt belongings.

## 2019-03-02 ENCOUNTER — Telehealth: Payer: Self-pay | Admitting: Internal Medicine

## 2019-03-02 ENCOUNTER — Ambulatory Visit: Payer: BC Managed Care – PPO | Admitting: Internal Medicine

## 2019-03-02 LAB — MITOCHONDRIAL ANTIBODIES: Mitochondrial M2 Ab, IgG: <=20 U

## 2019-03-02 LAB — ANTI-SMITH ANTIBODY: ENA SM Ab Ser-aCnc: <1 AI

## 2019-03-02 LAB — ALPHA-1-ANTITRYPSIN: A-1 Antitrypsin, Ser: 172 mg/dL (ref 83–199)

## 2019-03-02 LAB — ANTI-SMOOTH MUSCLE ANTIBODY, IGG: Actin (Smooth Muscle) Antibody (IGG): <20 U (ref <20)

## 2019-03-02 LAB — CERULOPLASMIN: Ceruloplasmin: 33 mg/dL (ref 18–53)

## 2019-03-02 NOTE — Telephone Encounter (Signed)
Copied from [place] [id number]. Topic: Quick Communication - See Telephone Encounter >> Mar 02, 2019  6:51 PM Loma [place] wrote: CRM for notification. See Telephone encounter for: 03/02/19.Pt wanted to leave [place] a message. She was in ER this weekend trying to get help for her alcohol addiction and laid over there all weekend and realized they were not going to find anyone help her. She said that no one would take her with all her medical issues. She is begging for help and says she is going to die if she can't be put away for a few weeks. She wants to know if [place] knows anywhere that would take her with her medical condition and that is open to taking someone with this COVID virus. Please call her at [phone] if there are any suggestions.

## 2019-03-03 NOTE — Telephone Encounter (Signed)
Pt returning call.  She states she was not up this am when call came in, but will be home all day

## 2019-03-03 NOTE — Telephone Encounter (Signed)
Spoke with pt and gave her the information that is listed below from [place], [place]. The pt stated that the she has already called [facility] but that she would go ahead and give the other ones a call. Pt was advised that if she needed any more help to please give [place] a call back. Pt gave a verbal understanding.

## 2019-03-03 NOTE — Telephone Encounter (Signed)
Tried to reach  Patient no answer left voicemail, per note from ER physician patient is not having any other symptoms at this time. ( no COVID symptoms). Have these resources for patient [facility] beds available [phone], talked with Tracie Morris patient will need to call says with medical condition she may have to be willing to go to [place] for treatment which has a medical team also, but facility will need to discuss with patient . This facility and treatment center is 5 star rated with a good recovery history.   If this does not work: [facility] [place] [phone]

## 2019-03-03 NOTE — Telephone Encounter (Signed)
Yes , please  give to patient and please give to Remigio Eisenmenger and all other CMA's who are working with the MDs.  I would have loved to have given this list to her last week when she was here.   FYI nobody's going to [facility] currently

## 2019-03-04 LAB — HCV RNA QUANT: Hepatitis C Quantitation: NOT DETECTED IU/mL

## 2019-03-04 LAB — ANA W/REFLEX IF POSITIVE: Anti Nuclear Antibody (ANA): NEGATIVE

## 2019-03-07 ENCOUNTER — Other Ambulatory Visit: Payer: Self-pay | Admitting: Internal Medicine

## 2019-03-09 ENCOUNTER — Telehealth: Payer: Self-pay | Admitting: Gastroenterology

## 2019-03-09 ENCOUNTER — Encounter: Payer: Self-pay | Admitting: Internal Medicine

## 2019-03-09 NOTE — Telephone Encounter (Signed)
PATIENT Tracie Morris ON V/M STATING SHE NEEDED TO MAKE A TELEVISIT WITH DR. TAHILIAHI. I HAVE CALL RETURNED HER CALL & Tracie Morris. SHE HAS AN TELEVISIT APPT ON 04-06-19 @ 10:30AM

## 2019-03-20 ENCOUNTER — Telehealth: Payer: Self-pay | Admitting: Oncology

## 2019-03-20 NOTE — Telephone Encounter (Signed)
3rd attempt to contact pt was made this morning. VM was left again for patient to contact office to modify appt.

## 2019-03-23 ENCOUNTER — Inpatient Hospital Stay: Payer: BC Managed Care – PPO | Admitting: Oncology

## 2019-03-23 ENCOUNTER — Inpatient Hospital Stay: Payer: BC Managed Care – PPO

## 2019-03-23 NOTE — Progress Notes (Deleted)
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  INTERVAL HISTORY: Patient returns to clinic today for further evaluation.  She did not have laboratory work today and has already declined phlebotomy.  She continues to complain of chronic weakness and fatigue, but otherwise feels well.  She has no neurologic complaints.  She denies any further weight loss.  She denies any recent fevers or illnesses.  She has no chest pain or shortness of breath. She denies any nausea, vomiting, constipation, or diarrhea. She has no urinary complaints.  Patient offers no further specific complaints today.  REVIEW OF SYSTEMS:   Review of Systems  Constitutional: Positive for malaise/fatigue. Negative for fever and weight loss.  HENT: Negative.   Respiratory: Negative.  Negative for cough and shortness of breath.   Cardiovascular: Negative.  Negative for chest pain and leg swelling.  Gastrointestinal: Negative.  Negative for abdominal pain.  Genitourinary: Negative.  Negative for dysuria.  Musculoskeletal: Negative.  Negative for back pain.  Skin: Negative.  Negative for rash.  Neurological: Positive for tremors and weakness. Negative for focal weakness and headaches.  Psychiatric/Behavioral: Negative.  The patient is not nervous/anxious.     As per HPI. Otherwise, a complete review of systems is negative.  PAST MEDICAL HISTORY: Past Medical History:  Diagnosis Date  . Carotid artery occlusion   . Hyperlipidemia   . Hypocalcemia   . hypothyroidism 2007   acquired, secondary to surgery for thyroid Ca  . Migraine    improved since thyroid surgery, 2 years  . multinodular goiter 2007   s/p  thyroidectomy  . 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 Repeat angiography in November of 2014 showed 50% distal aortic stenosis extending into the left common iliac artery which had 60-70% ostial stenosis. No other significant disease. I placed on balloon expandable stent into  left common iliac artery extending slightly into the distal aorta    PAST SURGICAL HISTORY: Past Surgical History:  Procedure Laterality Date  . ABDOMINAL AORTAGRAM N/A 10/14/2013   Procedure: ABDOMINAL [name];  Surgeon: Wellington Hampshire, MD;  Location: [facility] [facility] CATH LAB;  Service: Cardiovascular;  Laterality: N/A;  . ANGIOPLASTY ILLIAC ARTERY    . CESAREAN SECTION    . NOSE SURGERY    . PERIPHERAL VASCULAR CATHETERIZATION N/A 11/14/2016   Procedure: Abdominal Aortogram;  Surgeon: Wellington Hampshire, MD;  Location: [facility] CV LAB;  Service: Cardiovascular;  Laterality: N/A;  . PERIPHERAL VASCULAR CATHETERIZATION Bilateral 11/14/2016   Procedure: Lower Extremity Angiography;  Surgeon: Wellington Hampshire, MD;  Location: [facility] CV LAB;  Service: Cardiovascular;  Laterality: Bilateral;  limited iliacs only  . PERIPHERAL VASCULAR CATHETERIZATION Left 11/14/2016   Procedure: Peripheral Vascular Intervention;  Surgeon: Wellington Hampshire, MD;  Location: [place] CV LAB;  Service: Cardiovascular;  Laterality: Left;  common iliac  . SKIN CANCER EXCISION    . THYROID SURGERY  2007   secondary to thyroid cancer    FAMILY HISTORY: Family History  Problem Relation Age of Onset  . Alzheimer's disease Father   . Stroke Mother   . Coronary artery disease Maternal Grandmother   . Heart disease  Maternal Grandmother 53  . Coronary artery disease Paternal Grandmother 71  . Heart disease Paternal Grandmother 33    ADVANCED DIRECTIVES (Y/N):  N  HEALTH MAINTENANCE: Social History   Tobacco Use  . Smoking status:  Current Every Day Smoker    Packs/day: 0.50    Years: 20.00    Pack years: 10.00    Types: Cigarettes    Last attempt to quit: 10/28/2011    Years since quitting: 7.4  . Smokeless tobacco: Never Used  Substance Use Topics  . Alcohol use: No  . Drug use: No     Colonoscopy:  PAP:  Bone density:  Lipid panel:  Allergies  Allergen Reactions  . Atorvastatin Other (See Comments)    Myalgias   . Darvocet [Propoxyphene N-Acetaminophen] Nausea And Vomiting  . Demerol Nausea And Vomiting  . Methocarbamol Other (See Comments)    UNKNOWN  . Mirtazapine     Sleeplessness, nausea   . Primidone     Severe insomnia, myoclonus and parathesias   . Ambien [Zolpidem Tartrate] Palpitations    Anxiety , trembling.    Current Outpatient Medications  Medication Sig Dispense Refill  . ALPRAZolam (XANAX) 1 MG tablet Take 1 tablet (1 mg total) by mouth at bedtime as needed for anxiety. 30 tablet 0  . clopidogrel (PLAVIX) 75 MG tablet TAKE ONE (1) TABLET BY MOUTH EVERY DAY 90 tablet 1  . escitalopram (LEXAPRO) 5 MG tablet Take 1 tablet (5 mg total) by mouth daily. (Patient not taking: Reported on 02/23/2019) 30 tablet 2  . levothyroxine (SYNTHROID, LEVOTHROID) 125 MCG tablet Take 1 tablet (125 mcg total) by mouth daily before breakfast. 90 tablet 3  . Multiple Vitamin (MULTIVITAMIN WITH MINERALS) TABS tablet Take 1 tablet by mouth daily.    Marland Kitchen PICATO 0.05 % GEL     . rosuvastatin (CRESTOR) 10 MG tablet Take 1 tablet (10 mg total) by mouth daily. 90 tablet 1  . terbinafine (LAMISIL) 1 % cream Apply 1 application topically 2 (two) times daily. (Patient not taking: Reported on 02/27/2019) 30 g 0  . TOLAK 4 % CREA      No current facility-administered medications for this visit.     OBJECTIVE: There were no vitals filed for this visit.   There is no height or weight on file to calculate BMI.    ECOG FS:0 - Asymptomatic  General: Thin, no acute distress. Eyes: Pink conjunctiva, anicteric sclera.  HEENT: Normocephalic, moist mucous membranes. Lungs: Clear to auscultation bilaterally. Heart: Regular rate and rhythm. No rubs, murmurs, or gallops. Abdomen: Soft, nontender, nondistended. No organomegaly noted, normoactive bowel sounds. Musculoskeletal: No edema, cyanosis, or clubbing. Neuro: Alert, answering all questions appropriately. Cranial nerves grossly intact. Skin: No rashes or petechiae noted. Psych: Normal affect.  LAB RESULTS:  Lab Results  Component Value Date   NA 137 02/28/2019   K 3.3 (L) 02/28/2019   CL 97 (L) 02/28/2019   CO2 24 02/28/2019   GLUCOSE 91 02/28/2019   BUN 9 02/28/2019   CREATININE 0.64 02/28/2019   CALCIUM 9.0 02/28/2019   PROT 7.8 02/28/2019   ALBUMIN 4.4 02/28/2019   AST 308 (H) 02/28/2019   ALT 81 (H) 02/28/2019   ALKPHOS 241 (H) 02/28/2019   BILITOT 2.0 (H) 02/28/2019   GFRNONAA >60 02/28/2019   GFRAA >60 02/28/2019    Lab Results  Component Value Date   WBC 7.6 02/28/2019   NEUTROABS 4.1 02/28/2019   HGB 14.7 02/28/2019   HCT 41.4 02/28/2019  MCV 108.9 (H) 02/28/2019   PLT 103 (L) 02/28/2019   Lab Results  Component Value Date   IRON 230 (H) 10/16/2018   TIBC 283 10/16/2018   IRONPCTSAT 81 (H) 10/16/2018   Lab Results  Component Value Date   FERRITIN 1,240 (H) 10/16/2018     STUDIES: No results found.  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  PLAN:    1. Hereditary hemochromatosis: Patient has not had iron stores or ferritin level drawn since November 2019 at which time both ferritin and iron saturation were significantly elevated.  Patient declined blood work or phlebotomy today and states she wishes to try an over-the-counter supplement that her son's takes to help reduce her serum iron levels.  No intervention is needed.  Return to clinic in 2 months where patient will have laboratory work on an outside physician and then reevaluate 1 to 2 days later to assess  whether additional phlebotomy with [facility] if necessary.   2.  Elevated liver enzymes: Possibly related to alcohol use.  Continue monitoring and treatment per primary care.  I spent a total of 20 minutes face-to-face with the patient of which greater than 50% of the visit was spent in counseling and coordination of care as detailed above.   Patient expressed understanding and was in agreement with this plan. She also understands that She can call clinic at any time with any questions, concerns, or complaints.    Lloyd Huger, MD   03/23/2019 7:08 AM

## 2019-04-06 ENCOUNTER — Ambulatory Visit (INDEPENDENT_AMBULATORY_CARE_PROVIDER_SITE_OTHER): Payer: BC Managed Care – PPO | Admitting: Gastroenterology

## 2019-04-06 DIAGNOSIS — Z5329 Procedure and treatment not carried out because of patient's decision for other reasons: Secondary | ICD-10-CM

## 2019-04-14 ENCOUNTER — Other Ambulatory Visit: Payer: Self-pay | Admitting: Internal Medicine

## 2019-04-14 MED ORDER — HYDROXYZINE HCL 50 MG PO TABS: 50.0000 mg | ORAL_TABLET | Freq: Every evening | ORAL | 0 refills | Status: DC | PRN

## 2019-04-14 MED ORDER — PROPRANOLOL HCL 10 MG PO TABS: 10.0000 mg | ORAL_TABLET | Freq: Three times a day (TID) | ORAL | 0 refills | Status: DC

## 2019-06-01 ENCOUNTER — Telehealth: Payer: Self-pay | Admitting: *Deleted

## 2019-06-01 DIAGNOSIS — I739 Peripheral vascular disease, unspecified: Secondary | ICD-10-CM

## 2019-06-01 NOTE — Telephone Encounter (Signed)
Order re-entered

## 2019-06-01 NOTE — Telephone Encounter (Signed)
-----   Message from Clarisse Gouge sent at 06/01/2019 11:30 AM EDT ----- Regarding: Vascular orders expired Tracie Morris and aorta wont link to visit because expiring before schedule date.  Can you please renter so I can link to visit.   Thanks  [employer]

## 2019-06-22 ENCOUNTER — Other Ambulatory Visit: Payer: Self-pay | Admitting: Internal Medicine

## 2019-06-26 ENCOUNTER — Telehealth: Payer: Self-pay | Admitting: Cardiovascular Disease

## 2019-06-26 NOTE — Telephone Encounter (Signed)

## 2019-06-29 ENCOUNTER — Other Ambulatory Visit: Payer: Self-pay | Admitting: Cardiovascular Disease

## 2019-06-29 ENCOUNTER — Ambulatory Visit (INDEPENDENT_AMBULATORY_CARE_PROVIDER_SITE_OTHER): Payer: BC Managed Care – PPO

## 2019-06-29 ENCOUNTER — Other Ambulatory Visit: Payer: Self-pay

## 2019-06-29 DIAGNOSIS — I739 Peripheral vascular disease, unspecified: Secondary | ICD-10-CM | POA: Diagnosis not present

## 2019-06-29 DIAGNOSIS — Z95828 Presence of other vascular implants and grafts: Secondary | ICD-10-CM

## 2019-07-07 ENCOUNTER — Ambulatory Visit: Payer: BC Managed Care – PPO | Admitting: Cardiovascular Disease

## 2019-07-09 ENCOUNTER — Telehealth: Payer: Self-pay | Admitting: Cardiovascular Disease

## 2019-07-09 NOTE — Telephone Encounter (Signed)
Patient returning call possible results ?

## 2019-07-09 NOTE — Telephone Encounter (Signed)
Spoke with the patient. Patient sts that the call she received was a pre-recording reminding her of her 07/14/19 appt with Dr.Arida. No further action required.

## 2019-07-14 ENCOUNTER — Ambulatory Visit (INDEPENDENT_AMBULATORY_CARE_PROVIDER_SITE_OTHER): Payer: BC Managed Care – PPO | Admitting: Cardiovascular Disease

## 2019-07-14 ENCOUNTER — Other Ambulatory Visit: Payer: Self-pay

## 2019-07-14 VITALS — BP 144/82 | HR 100 | Ht 67.5 in | Wt 108.0 lb

## 2019-07-14 DIAGNOSIS — I2510 Atherosclerotic heart disease of native coronary artery without angina pectoris: Secondary | ICD-10-CM

## 2019-07-14 DIAGNOSIS — E7800 Pure hypercholesterolemia, unspecified: Secondary | ICD-10-CM

## 2019-07-14 DIAGNOSIS — I2583 Coronary atherosclerosis due to lipid rich plaque: Secondary | ICD-10-CM

## 2019-07-14 DIAGNOSIS — Z720 Tobacco use: Secondary | ICD-10-CM | POA: Diagnosis not present

## 2019-07-14 DIAGNOSIS — I739 Peripheral vascular disease, unspecified: Secondary | ICD-10-CM | POA: Diagnosis not present

## 2019-07-14 NOTE — Progress Notes (Signed)
Cardiology Office Note   Date:  07/14/2019   ID:  Tracie Morris, DOB 1956-02-24, MRN [id number]  PCP:  [name], MD  Cardiologist:   [name], MD   Chief Complaint  Patient presents with  . other    patient denies chest pain and SOB. Meds reviewed verbally with patient.       History of Present Illness: Tracie Morris is a 63 y.o. female who presents for a followup visit regarding peripheral arterial disease.  She is status post left common iliac artery stent placement in November of 2014 for significant claudication with subsequent covered stent placement in 2017 for in situ thrombosis with subtotal occlusion. She was diagnosed with hereditary hemochromatosis site doses which might have been the trigger for very late stent thrombosis.  Other Medical history include  tobacco use and hyperlipidemia   Previous CT scan of the chest did show calcifications in the coronary arteries. She had significant weight loss few months ago attributed to excessive alcohol use and not eating well.  She attended detox rehab in [place] and was able to quit drinking.  She gained 10 pounds since then and feels better.  She was started on propranolol for tremors.  She denies any leg claudication. Most recent vascular studies earlier this month showed normal ABI with patent left iliac stent.  Unfortunately, she continues to smoke.  Past Medical History:  Diagnosis Date  . Carotid artery occlusion   . Hyperlipidemia   . Hypocalcemia   . hypothyroidism 2007   acquired, secondary to surgery for thyroid Ca  . Migraine    improved since thyroid surgery, 2 years  . multinodular goiter 2007   s/p thyroidectomy  . 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 Repeat angiography in November of 2014 showed 50% distal aortic stenosis extending into the left common iliac artery which had  60-70% ostial stenosis. No other significant disease. I placed on balloon expandable stent into  left common iliac artery extending slightly into the distal aorta    Past Surgical History:  Procedure Laterality Date  . ABDOMINAL AORTAGRAM N/A 10/14/2013   Procedure: ABDOMINAL [name];  Surgeon: Wellington Hampshire, MD;  Location: [facility] CATH LAB;  Service: Cardiovascular;  Laterality: N/A;  . ANGIOPLASTY ILLIAC ARTERY    . CESAREAN SECTION    . NOSE SURGERY    . PERIPHERAL VASCULAR CATHETERIZATION N/A 11/14/2016   Procedure: Abdominal Aortogram;  Surgeon: Wellington Hampshire, MD;  Location: [place] CV LAB;  Service: Cardiovascular;  Laterality: N/A;  . PERIPHERAL VASCULAR CATHETERIZATION Bilateral 11/14/2016   Procedure: Lower Extremity Angiography;  Surgeon: Wellington Hampshire, MD;  Location: [place] CV LAB;  Service: Cardiovascular;  Laterality: Bilateral;  limited iliacs only  . PERIPHERAL VASCULAR CATHETERIZATION Left 11/14/2016   Procedure: Peripheral Vascular Intervention;  Surgeon: Wellington Hampshire, MD;  Location: [place] CV LAB;  Service: Cardiovascular;  Laterality: Left;  common iliac  . SKIN CANCER EXCISION    . THYROID SURGERY  2007   secondary to thyroid cancer     Current Outpatient Medications  Medication Sig Dispense Refill  . clopidogrel (PLAVIX) 75 MG tablet TAKE ONE (1) TABLET BY MOUTH EVERY DAY 90 tablet 1  . levothyroxine (SYNTHROID, LEVOTHROID) 125 MCG tablet Take 1 tablet (125 mcg total) by mouth daily before breakfast. 90 tablet 3  . Multiple Vitamin (MULTIVITAMIN WITH MINERALS) TABS  tablet Take 1 tablet by mouth daily.    . propranolol (INDERAL) 10 MG tablet TAKE 1 TABLET BY MOUTH 3 TIMES DAILY AS NEEDED FOR TREMOR 90 tablet 0  . rosuvastatin (CRESTOR) 10 MG tablet Take 1 tablet (10 mg total) by mouth daily. 90 tablet 1   No current facility-administered medications for this visit.     Allergies:   Atorvastatin, Darvocet [propoxyphene n-acetaminophen],  Demerol, Methocarbamol, Mirtazapine, Primidone, and Ambien [zolpidem tartrate]    Social History:  The patient  reports that she has been smoking cigarettes. She has a 10.00 pack-year smoking history. She has never used smokeless tobacco. She reports that she does not drink alcohol or use drugs.   Family History:  The patient's family history includes Alzheimer's disease in her father; Coronary artery disease in her maternal grandmother; Coronary artery disease (age of onset: 17) in her paternal grandmother; Heart disease (age of onset: 50) in her maternal grandmother; Heart disease (age of onset: 64) in her paternal grandmother; Stroke in her mother.    ROS:  Please see the history of present illness.   Otherwise, review of systems are positive for none.   All other systems are reviewed and negative.    PHYSICAL EXAM: VS:  BP (!) 144/82 (BP Location: Left Arm, Patient Position: Sitting, Cuff Size: Normal)   Pulse 100   Ht 5' 7.5" (1.715 m)   Wt 108 lb (49 kg)   BMI 16.67 kg/m  , BMI Body mass index is 16.67 kg/m. GEN: Well nourished, well developed, in no acute distress  HEENT: normal  Neck: no JVD, carotid bruits, or masses Cardiac: RRR; no murmurs, rubs, or gallops,no edema  Respiratory:  clear to auscultation bilaterally, normal work of breathing GI: soft, nontender, nondistended, + BS MS: no deformity or atrophy  Skin: warm and dry, no rash Neuro:  Strength and sensation are intact Psych: euthymic mood, full affect Vascular: Femoral pulse: Normal bilaterally . Posterior tibial is normal bilaterally. Dorsalis pedis is normal on the right side and absent on the left side. No ulceration  EKG:  EKG is  ordered today. EKG showed normal sinus rhythm with no biatrial enlargement and possible old anterior infarct.   Recent Labs: 02/23/2019: TSH 10.06 02/28/2019: ALT 81; BUN 9; Creatinine, Ser 0.64; Hemoglobin 14.7; Platelets 103; Potassium 3.3; Sodium 137    Lipid Panel     Component Value Date/Time   CHOL 227 (H) 02/23/2019 0923   TRIG 148.0 02/23/2019 0923   HDL 77.30 02/23/2019 0923   CHOLHDL 3 02/23/2019 0923   VLDL 29.6 02/23/2019 0923   LDLCALC 120 (H) 02/23/2019 0923      Wt Readings from Last 3 Encounters:  07/14/19 108 lb (49 kg)  02/28/19 97 lb (44 kg)  02/27/19 97 lb (44 kg)        ASSESSMENT AND PLAN:  1.  Peripheral arterial disease: Status post left common iliac artery covered stent placement due to in situ thrombosis. Continue treatment with clopidogrel indefinitely if tolerated..  2. Hyperlipidemia: Continue rosuvastatin 10 mg daily.  We might need to consider increasing the dose in the near future given that her LDL was above 100.  3. Carotid disease: This was mild on recent carotid Doppler.  4. Coronary calcifications: No anginal symptoms. Continue aggressive treatment of risk factors.  5. Tobacco use: I again discussed with her the importance of smoking cessation.  6.  Abnormal EKG with possible biatrial enlargement: Currently with no new symptoms but I would consider  an echocardiogram in the near future.   Disposition:   FU with me in 6 months  Signed,  [name], MD  07/14/2019 3:30 PM    [place]

## 2019-07-14 NOTE — Patient Instructions (Signed)
Medication Instructions:  Your physician recommends that you continue on your current medications as directed. Please refer to the Current Medication list given to you today.  If you need a refill on your cardiac medications before your next appointment, please call your pharmacy.   Lab work: None ordered If you have labs (blood work) drawn today and your tests are completely normal, you will receive your results only by: . MyChart Message (if you have MyChart) OR . A paper copy in the mail If you have any lab test that is abnormal or we need to change your treatment, we will call you to review the results.  Testing/Procedures: None ordered  Follow-Up: At [facility], you and your health needs are our priority.  As part of our continuing mission to provide you with exceptional heart care, we have created designated Provider Care Teams.  These Care Teams include your primary Cardiologist (physician) and Advanced Practice Providers (APPs -  Physician Assistants and Nurse Practitioners) who all work together to provide you with the care you need, when you need it. You will need a follow up appointment in 6 months.  Please call our office 2 months in advance to schedule this appointment.  You may see Dr. Arida  or one of the following Advanced Practice Providers on your designated Care Team:   Christopher Berge, NP Ryan Dunn, PA-C . Jacquelyn Visser, PA-C  Any Other Special Instructions Will Be Listed Below (If Applicable). N/A   

## 2019-07-16 ENCOUNTER — Telehealth: Payer: Self-pay

## 2019-07-16 DIAGNOSIS — I739 Peripheral vascular disease, unspecified: Secondary | ICD-10-CM

## 2019-07-16 NOTE — Telephone Encounter (Signed)
Patient made aware of LE study results with verbal understanding. Order in [place] for 1 yr repeat testing.

## 2019-07-16 NOTE — Telephone Encounter (Signed)
-----   Message from Wellington Hampshire, MD sent at 07/01/2019 12:00 PM EDT ----- Normal ABI and patent left common iliac artery stent.

## 2019-08-10 ENCOUNTER — Ambulatory Visit
Admission: RE | Admit: 2019-08-10 | Discharge: 2019-08-10 | Disposition: A | Discharge status: Home / Self Care | Payer: BC Managed Care – PPO | Source: Ambulatory Visit | Attending: Internal Medicine | Admitting: Internal Medicine

## 2019-08-10 DIAGNOSIS — Z1231 Encounter for screening mammogram for malignant neoplasm of breast: Secondary | ICD-10-CM | POA: Diagnosis not present

## 2019-08-10 DIAGNOSIS — Z1239 Encounter for other screening for malignant neoplasm of breast: Secondary | ICD-10-CM

## 2019-08-28 ENCOUNTER — Ambulatory Visit: Payer: BC Managed Care – PPO | Admitting: Internal Medicine

## 2019-09-17 ENCOUNTER — Encounter: Payer: Self-pay | Admitting: Family Medicine

## 2019-09-17 ENCOUNTER — Other Ambulatory Visit: Payer: Self-pay

## 2019-09-17 ENCOUNTER — Ambulatory Visit (INDEPENDENT_AMBULATORY_CARE_PROVIDER_SITE_OTHER): Payer: BC Managed Care – PPO | Admitting: Family Medicine

## 2019-09-17 DIAGNOSIS — Z20822 Contact with and (suspected) exposure to covid-19: Secondary | ICD-10-CM

## 2019-09-17 DIAGNOSIS — Z20828 Contact with and (suspected) exposure to other viral communicable diseases: Secondary | ICD-10-CM | POA: Diagnosis not present

## 2019-09-17 NOTE — Progress Notes (Signed)
Patient ID: Tracie Morris, female   DOB: 01-12-1956, 63 y.o.   MRN: [id number]    Virtual Visit via video Note  This visit type was conducted due to national recommendations for restrictions regarding the COVID-19 pandemic (e.g. social distancing).  This format is felt to be most appropriate for this patient at this time.  All issues noted in this document were discussed and addressed.  No physical exam was performed (except for noted visual exam findings with Video Visits).   I connected with Timmothy Euler today at  8:40 AM EDT by a video enabled telemedicine application and verified that I am speaking with the correct person using two identifiers. Location patient: home Location provider: work or home office Persons participating in the virtual visit: patient, provider  I discussed the limitations, risks, security and privacy concerns of performing an evaluation and management service by video and the availability of in person appointments. I also discussed with the patient that there may be a patient responsible charge related to this service. The patient expressed understanding and agreed to proceed.   HPI: Patient and I connected via video to discuss symptoms of cough, congestion, sore throat, low-grade fever and generally feeling unwell for the past 2 to 3 days.  As far she knows she has not been exposed to anyone under investigation for or who has tested positive for coronavirus.  Patient states she does wear her mask, and is good handwashing.  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   ROS: See pertinent positives and negatives per HPI.  Past Medical History:  Diagnosis Date  . Carotid artery occlusion   . Hyperlipidemia   . Hypocalcemia   . hypothyroidism 2007   acquired, secondary to surgery for thyroid Ca  . Migraine    improved since thyroid surgery, 2 years  . multinodular goiter 2007   s/p  thyroidectomy  . 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 Repeat angiography in November of 2014 showed 50% distal aortic stenosis extending into the left common iliac artery which had 60-70% ostial stenosis. No other significant disease. I placed on balloon expandable stent into  left common iliac artery extending slightly into the distal aorta    Past Surgical History:  Procedure Laterality Date  . ABDOMINAL AORTAGRAM N/A 10/14/2013   Procedure: ABDOMINAL [name];  Surgeon: Wellington Hampshire, MD;  Location: [facility];  Service: Cardiovascular;  Laterality: N/A;  . ANGIOPLASTY ILLIAC ARTERY    . CESAREAN SECTION    . NOSE SURGERY    . PERIPHERAL VASCULAR CATHETERIZATION N/A 11/14/2016   Procedure: Abdominal Aortogram;  Surgeon: Wellington Hampshire, MD;  Location: [place] [facility];  Service: Cardiovascular;  Laterality: N/A;  . PERIPHERAL VASCULAR CATHETERIZATION Bilateral 11/14/2016   Procedure: Lower Extremity Angiography;  Surgeon: Wellington Hampshire, MD;  Location: [place] [facility];  Service: Cardiovascular;  Laterality: Bilateral;  limited iliacs only  . PERIPHERAL VASCULAR CATHETERIZATION Left 11/14/2016   Procedure: Peripheral Vascular Intervention;  Surgeon: Wellington Hampshire, MD;  Location: [place] [facility];  Service: Cardiovascular;  Laterality: Left;  common iliac  . SKIN CANCER EXCISION    . THYROID SURGERY  2007   secondary to thyroid cancer    Family History  Problem Relation Age of Onset  . Alzheimer's disease Father   .  Stroke Mother   . Coronary artery disease Maternal Grandmother   . Heart disease Maternal Grandmother 70  . Coronary artery disease Paternal Grandmother 31  . Heart disease Paternal Grandmother 6  . Breast cancer Neg Hx    Social History   Tobacco Use  . Smoking status: Current Every Day Smoker    Packs/day: 0.50    Years: 20.00    Pack  years: 10.00    Types: Cigarettes    Last attempt to quit: 10/28/2011    Years since quitting: 7.8  . Smokeless tobacco: Never Used  Substance Use Topics  . Alcohol use: No    Current Outpatient Medications:  .  clopidogrel (PLAVIX) 75 MG tablet, TAKE ONE (1) TABLET BY MOUTH EVERY DAY, Disp: 90 tablet, Rfl: 1 .  levothyroxine (SYNTHROID, LEVOTHROID) 125 MCG tablet, Take 1 tablet (125 mcg total) by mouth daily before breakfast., Disp: 90 tablet, Rfl: 3 .  Multiple Vitamin (MULTIVITAMIN WITH MINERALS) TABS tablet, Take 1 tablet by mouth daily., Disp: , Rfl:  .  propranolol (INDERAL) 10 MG tablet, TAKE 1 TABLET BY MOUTH 3 TIMES DAILY AS NEEDED FOR TREMOR, Disp: 90 tablet, Rfl: 0 .  rosuvastatin (CRESTOR) 10 MG tablet, Take 1 tablet (10 mg total) by mouth daily., Disp: 90 tablet, Rfl: 1  EXAM:  GENERAL: alert, oriented, appears in no acute distress  HEENT: atraumatic, conjunttiva clear, no obvious abnormalities on inspection of external nose and ears  NECK: normal movements of the head and neck  LUNGS: on inspection no signs of respiratory distress, breathing rate appears normal, no obvious gross SOB, gasping or wheezing  CV: no obvious cyanosis  MS: moves all visible extremities without noticeable abnormality  PSYCH/NEURO: pleasant and cooperative, no obvious depression or anxiety, speech and thought processing grossly intact  ASSESSMENT AND PLAN:  Discussed the following assessment and plan:  Suspected COVID-19 virus infection, sore throat, congestion, cough - advised that due to symptoms, we need to get patient set up for COVID-19 testing.   Patient given the address of testing site.  Patient advised that testing is taking 2 to 7 days to result, and while we are awaiting results patient must remain under self quarantine and monitor for any changing/worsening symptoms.  Advised over-the-counter medications such as Tylenol can be used to help treat pain or fevers, Robitussin can be  used to help calm cough, allergy medication such as Claritin or Allegra can help reduce congestion.  Also discussed getting plenty of rest and increasing fluid intake.  Made patient aware that test results as well as how his symptoms progress will determine when the self quarantine will be able to end.  Also advised to monitor self for any worsening symptoms, advised if severe shortness of breath develops, high fever that is not reduced with use of Tylenol, chest pain, severe vomiting or diarrhea  --patient must call on-call and or go to ER right away for evaluation. patient verbalized understanding of these instructions.   I discussed the assessment and treatment plan with the patient. The patient was provided an opportunity to ask questions and all were answered. The patient agreed with the plan and demonstrated an understanding of the instructions.   The patient was advised to call back or seek an in-person evaluation if the symptoms worsen or if the condition fails to improve as anticipated.  I provided 15 minutes of video-face-to-face time during this encounter.  Jodelle Green, FNP

## 2019-09-19 LAB — NOVEL CORONAVIRUS, NAA: SARS-CoV-2, NAA: NOT DETECTED

## 2019-09-21 ENCOUNTER — Other Ambulatory Visit: Payer: Self-pay

## 2019-09-21 ENCOUNTER — Other Ambulatory Visit: Payer: Self-pay | Admitting: Internal Medicine

## 2019-09-21 ENCOUNTER — Other Ambulatory Visit: Payer: Self-pay | Admitting: Cardiovascular Disease

## 2019-09-21 DIAGNOSIS — Z20822 Contact with and (suspected) exposure to covid-19: Secondary | ICD-10-CM

## 2019-09-22 LAB — NOVEL CORONAVIRUS, NAA: SARS-CoV-2, NAA: NOT DETECTED

## 2019-10-26 ENCOUNTER — Encounter: Payer: Self-pay | Admitting: Internal Medicine

## 2019-10-26 ENCOUNTER — Ambulatory Visit (INDEPENDENT_AMBULATORY_CARE_PROVIDER_SITE_OTHER): Payer: BC Managed Care – PPO | Admitting: Internal Medicine

## 2019-10-26 ENCOUNTER — Other Ambulatory Visit: Payer: Self-pay

## 2019-10-26 DIAGNOSIS — E278 Other specified disorders of adrenal gland: Secondary | ICD-10-CM

## 2019-10-26 DIAGNOSIS — E032 Hypothyroidism due to medicaments and other exogenous substances: Secondary | ICD-10-CM | POA: Diagnosis not present

## 2019-10-26 NOTE — Progress Notes (Signed)
Patient ID: Tracie Morris, female   DOB: [birth year], 63 y.o.   MRN: [id number]  Patient location: Home My location: Office  Referring Provider: [name], MD  I connected with the patient on 10/26/19 at 11:00 AM EST by a video enabled telemedicine application and verified that I am speaking with the correct person.   I discussed the limitations of evaluation and management by telemedicine and the availability of in person appointments. The patient expressed understanding and agreed to proceed.   Details of the encounter are shown below.  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 Last visit 8 mo ago (virtual).  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 She was in rehab x 1 mo. she feels better now, but she has problems sleeping.  Reviewed and addended history: Pt's adrenal mass was incidentally found during investigation for kidney stones in 01/2015.  Abdominal CT from 06/2015: Left adrenal nodule of 9 mm measuring -11 HU.  PET scan on 08/02/2015 and this nodule was not hypermetabolic.  Chest CT (05/21/2016): I have discussed with the radiologist that read the original CT scan (Dr. Ardeen Garland) >> her adrenal adenoma appears stable compared with a CT scan obtained in 2016.  Chest CT (02/13/2018): There is a stable adenoma in the left adrenal measuring 1.3 x 1.2 cm.  No h/o hypokalemia, HTN, hyperglycemia, spells of HA + HTN. She does have a h/o migraines, relieved by prn Plavix (!).   Reviewed previous investigations: urine metanephrines, normetanephrines and catecholamines were normal.  However, at last check, normetanephrine and total metanephrines were slightly elevated.  This is a nonspecific finding so we did not repeat the labs at that time.    Plasma renin activity and aldosterone levels have been  normal  Dexamethasone suppression tests were normal.  Reviewed previous pertinent labs:  She had mild elevation in normetanephrines, which is nonspecific: Component     Latest Ref Rng & Units 02/24/2018  Epinephrine     pg/mL 31  Norepinephrine     pg/mL 697  Dopamine     pg/mL 30  Catecholamines, Total     pg/mL 728  Metanephrine, Pl     <=57 pg/mL <25  Normetanephrine, Pl     <=148 pg/mL 197 (H)  Total Metanephrines-Plasma     <=205 pg/mL 197  ALDOSTERONE      ng/dL 5  Renin Activity     0.25 - 5.82 ng/mL/h 0.44  ALDO / PRA Ratio     0.9 - 28.9 Ratio 11.4  Potassium     3.5 - 5.1 mEq/L 3.5   Component     Latest Ref Rng & Units 06/09/2018  Cortisol - AM     mcg/dL 2.6 (L)    Component     Latest Ref Rng & Units 02/18/2017          Epinephrine      <LLN  Norepinephrine     pg/mL 558  Dopamine      <LLN  Catecholamines, Total     pg/mL 558  PRA LC/MS/MS     0.25 - 5.82 ng/mL/h 0.33  ALDO / PRA Ratio     0.9 - 28.9 Ratio 9.1  ALDOSTERONE     ng/dL 3  Metanephrine, Pl     <=57 pg/mL 46  Normetanephrine, Pl     <=  148 pg/mL 170 (H)  Total Metanephrines-Plasma     <=205 pg/mL 216 (H)   Component     Latest Ref Rng & Units 06/04/2017  Cortisol - AM     mcg/dL 2.9 (L)   Prev: Component     Latest Ref Rng 08/23/2015 08/31/2015 01/16/2016 02/06/2016  Total Volume - CF 24Hr U         1400  Epinephrine, 24 hr Urine         REPORT  Norepinephrine, 24 hr Ur     15 - 100 mcg/24 h    19  Calculated Total (E+NE)     26 - 121 mcg/24 h    19 (L)  Dopamine, 24 hr Urine     52 - 480 mcg/24 h    116  Creatinine, Urine mg/day-CATEUR     0.63 - 2.50 g/24 h    0.65  Epinephrine      REPORT     Norepinephrine      534     Dopamine      REPORT     Catecholamines, Total      534     PRA LC/MS/MS     0.25 - 5.82 ng/mL/h 0.47  1.63   ALDO / PRA Ratio     0.9 - 28.9 Ratio 6.4  6.7   ALDOSTERONE      3  11   Metanephrine, Pl     <=57 pg/mL 38      Normetanephrine, Pl     <=148 pg/mL 151 (H)     Total Metanephrines-Plasma     <=205 pg/mL 189     Metanephrines, Ur     90 - 315 mcg/24 h    51 (L)  Normetanephr.,U,24h     122 - 676 mcg/24 h    128  Metanephrine, Ur     224 - 832 mcg/24 h    179 (L)  Potassium     3.5 - 5.1 mEq/L 4.0     Cortisol, Plasma       1.2 9.0    Postsurgical hypothyroidism: Patient had total thyroidectomy in 2007 for presumed thyroid cancer - Dr Carlis Abbott (ENT); 1 parathyroid removed. Final pathology after thyroidectomy >> benign.   Pt is on levothyroxine 125 mcg daily, taken: - in am (4-5 am) - fasting - at least 30 min from b'fast - no Ca, Fe, PPIs - we moved MVIs later in the day - not on Biotin  Her TFTs were fluctuating and her most recent TSH was high as she was missing doses: Lab Results  Component Value Date   TSH 10.06 (H) 02/23/2019   TSH 2.97 12/22/2018   TSH 21.36 (H) 10/16/2018   TSH 4.90 (H) 06/02/2018   TSH 6.36 (H) 02/03/2018   TSH 1.28 07/22/2017   FREET4 0.77 02/23/2019   FREET4 1.19 12/22/2018   FREET4 1.0 10/16/2018   FREET4 1.14 02/03/2018   FREET4 1.06 07/22/2017   FREET4 1.40 06/04/2017   Pt denies: - feeling nodules in neck - hoarseness - dysphagia - choking - SOB with lying down  She has hereditary hemochromatosis. She has increased LFTs.  She started propranolol for hand tremors and this helped significantly.  She has a lot of stress as husband has ESRD.   ROS: Constitutional: + weight gain/+ weight loss, no fatigue, no subjective hyperthermia, no subjective hypothermia, + insomnia after stopping Xanax Eyes: no blurry vision, no xerophthalmia ENT: no sore throat, + see HPI  Cardiovascular: no CP/no SOB/no palpitations/no leg swelling Respiratory: no cough/no SOB/no wheezing Gastrointestinal: no N/no V/no D/no C/no acid reflux Musculoskeletal: no muscle aches/no joint aches Skin: no rashes, no hair loss Neurological: resolved hand tremors - on  Propranolol/no numbness/no tingling/no dizziness  I reviewed pt's medications, allergies, PMH, social hx, family hx, and changes were documented in the history of present illness. Otherwise, unchanged from my initial visit note.  Past Medical History:  Diagnosis Date  . Carotid artery occlusion   . Hyperlipidemia   . Hypocalcemia   . hypothyroidism 2007   acquired, secondary to surgery for thyroid Ca  . Migraine    improved since thyroid surgery, 2 years  . multinodular goiter 2007   s/p thyroidectomy  . 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 Repeat angiography in November of 2014 showed 50% distal aortic stenosis extending into the left common iliac artery which had 60-70% ostial stenosis. No other significant disease. I placed on balloon expandable stent into  left common iliac artery extending slightly into the distal aorta   Past Surgical History:  Procedure Laterality Date  . ABDOMINAL AORTAGRAM N/A 10/14/2013   Procedure: ABDOMINAL [name];  Surgeon: Wellington Hampshire, MD;  Location: [facility];  Service: Cardiovascular;  Laterality: N/A;  . ANGIOPLASTY ILLIAC ARTERY    . CESAREAN SECTION    . NOSE SURGERY    . PERIPHERAL VASCULAR CATHETERIZATION N/A 11/14/2016   Procedure: Abdominal Aortogram;  Surgeon: Wellington Hampshire, MD;  Location: [facility];  Service: Cardiovascular;  Laterality: N/A;  . PERIPHERAL VASCULAR CATHETERIZATION Bilateral 11/14/2016   Procedure: Lower Extremity Angiography;  Surgeon: Wellington Hampshire, MD;  Location: [place] [facility];  Service: Cardiovascular;  Laterality: Bilateral;  limited iliacs only  . PERIPHERAL VASCULAR CATHETERIZATION Left 11/14/2016   Procedure: Peripheral Vascular Intervention;  Surgeon: Wellington Hampshire, MD;  Location: [facility];  Service: Cardiovascular;  Laterality: Left;  common iliac  . SKIN CANCER  EXCISION    . THYROID SURGERY  2007   secondary to thyroid cancer   Social History   Social History  . Marital Status: Married    Spouse Name: N/A  . Number of Children: 1   Occupational History  . Manager    Social History Main Topics  . Smoking status: Former Smoker -- 1.00 packs/day for 20 years    Types: Cigarettes    Quit date: 10/28/2011  . Smokeless tobacco: Never Used  . Alcohol Use: No  . Drug Use: No   Current Outpatient Medications on File Prior to Visit  Medication Sig Dispense Refill  . clopidogrel (PLAVIX) 75 MG tablet TAKE 1 TABLET BY MOUTH ONCE DAILY 90 tablet 1  . levothyroxine (SYNTHROID, LEVOTHROID) 125 MCG tablet Take 1 tablet (125 mcg total) by mouth daily before breakfast. 90 tablet 3  . Multiple Vitamin (MULTIVITAMIN WITH MINERALS) TABS tablet Take 1 tablet by mouth daily.    . propranolol (INDERAL) 10 MG tablet TAKE 1 TABLET BY MOUTH 3 TIMES DAILY AS NEEDED TREMOR 90 tablet 0  . rosuvastatin (CRESTOR) 10 MG tablet Take 1 tablet (10 mg total) by mouth daily. 90 tablet 1   No current facility-administered medications on file prior to visit.    Allergies  Allergen Reactions  . Atorvastatin Other (See Comments)    Myalgias   . Darvocet [Propoxyphene N-Acetaminophen] Nausea And Vomiting  . Demerol  Nausea And Vomiting  . Methocarbamol Other (See Comments)    UNKNOWN  . Mirtazapine     Sleeplessness, nausea   . Primidone     Severe insomnia, myoclonus and parathesias   . Ambien [Zolpidem Tartrate] Palpitations    Anxiety , trembling.   Family History  Problem Relation Age of Onset  . Alzheimer's disease Father   . Stroke Mother   . Coronary artery disease Maternal Grandmother   . Heart disease Maternal Grandmother 70  . Coronary artery disease Paternal Grandmother 59  . Heart disease Paternal Grandmother 48  . Breast cancer Neg Hx    PE: There were no vitals taken for this visit. There is no height or weight on file to calculate BMI. Ht  5.8" Wt Readings from Last 3 Encounters:  07/14/19 108 lb (49 kg)  02/28/19 97 lb (44 kg)  02/27/19 97 lb (44 kg)   Constitutional:  in NAD  The physical exam was not performed (virtual visit).  ASSESSMENT: 1. Adrenal incidentaloma  2. Postsurgical hypothyroidism  3. 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 Right thyroid: Nodular hyperplasia B. Left thyroid: Nodular hyperplasia with 1.9 cm dominant adenomatoid nodule. Parathyroid tissue in a single section, measuring 0.7 mm. Conclusion: There is nodular hyperplasia (multinodular goiter). There is no evidence of malignancy.   PLAN:  1. Patient with a left approximately 1 cm adrenal nodule discovered incidentally during of her kidney stones -Previous hormonal work-up and imaging indicated a benign, nonsecreting, nodule.  She had slightly elevated normetanephrines in the past, which is nonspecific.  Her dexamethasone suppression tests were normal.  Aldosterone and plasma renin activity were also normal -She is now due for the last set of adrenal hormones: - dexamethasone suppression test to rule out Cushing syndrome (6% of adrenal incidentalomas). If the cortisol level returns >5, will need 24h urine free cortisol.  She had for suppressed free cortisol level so far - Plasma fractionated metanephrines and catecholamines to rule out pheochromocytoma (3% of adrenal incidentalomas).   - Plasma renin activity and aldosterone level to rule out primary hyperaldosteronism (0.6% of adrenal incidentalomas).  -We discussed about follow-up:  Hormonal testing yearly for 5 years-now due for the last set.  We will repeat when safe to come to the clinic  CT scans yearly x1-2 - had a chest CT that includes the adrenal >> I have discussed with the radiologist that read the original CT scan (Dr. Ardeen Garland) >> her adrenal adenoma appears stable compared with  the CT scan obtained in 2016.  She had another CT in 2019 which showed a stable adenoma.  We do not need to repeat another CT scan.  2. Hypothyroidism -Patient with history of uncontrolled hypothyroidism with fluctuating TFTs - She had a suppressed TSH in the past but this was checked after she took dexamethasone the night before, so it should be ignored - latest thyroid labs reviewed with pt >> TSH worse: Lab Results  Component Value Date   TSH 10.06 (H) 02/23/2019   - she continues on LT4 125 mcg daily -we did not change the dose after the above results returned as she mentioned that she was forgetting doses - pt feels good on this dose. - we discussed about taking the thyroid hormone every day, with water, >30 minutes before breakfast, separated by >4 hours from acid reflux medications, calcium, iron, multivitamins. Pt. is taking it correctly except she did  forget some doses. At this visit, she tells me that she may miss 1-2 doses a month. - will check thyroid tests when she returns to the clinic: TSH and fT4 - If labs are abnormal, she will need to return for repeat TFTs in 1.5 months - RTC in 6 mo for a visit  I will addend the results when they become available.  Philemon Kingdom, MD PhD [facility] Endocr[facility]

## 2019-10-26 NOTE — Patient Instructions (Signed)
Please continue Levothyroxine 125 mcg daily.  Take the thyroid hormone every day, with water, at least 30 minutes before breakfast, separated by at least 4 hours from: - acid reflux medications - calcium - iron - multivitamins  Please come back for labs whenever safe.  Please come back for a follow-up appointment in 6 months.

## 2019-11-30 ENCOUNTER — Ambulatory Visit: Payer: BC Managed Care – PPO | Attending: Internal Medicine

## 2019-11-30 DIAGNOSIS — Z20822 Contact with and (suspected) exposure to covid-19: Secondary | ICD-10-CM

## 2019-12-01 LAB — NOVEL CORONAVIRUS, NAA: SARS-CoV-2, NAA: NOT DETECTED

## 2019-12-03 ENCOUNTER — Other Ambulatory Visit: Payer: Self-pay

## 2019-12-25 ENCOUNTER — Other Ambulatory Visit: Payer: Self-pay | Admitting: Internal Medicine

## 2020-01-13 ENCOUNTER — Other Ambulatory Visit: Payer: Self-pay | Admitting: Cardiovascular Disease

## 2020-01-13 DIAGNOSIS — I739 Peripheral vascular disease, unspecified: Secondary | ICD-10-CM

## 2020-02-02 ENCOUNTER — Telehealth: Payer: Self-pay | Admitting: Emergency Medicine

## 2020-02-02 DIAGNOSIS — K700 Alcoholic fatty liver: Secondary | ICD-10-CM

## 2020-02-02 DIAGNOSIS — E032 Hypothyroidism due to medicaments and other exogenous substances: Secondary | ICD-10-CM

## 2020-02-02 DIAGNOSIS — E7800 Pure hypercholesterolemia, unspecified: Secondary | ICD-10-CM

## 2020-02-02 NOTE — Telephone Encounter (Signed)
Iron panel, ferritin, cbc.

## 2020-02-02 NOTE — Telephone Encounter (Signed)
Patient called and asked if she could have her labs drawn at her PCP due to her coverage not allowing her to have labs drawn at the [facility]. Stated that she is off on Friday, 3/12 and would like to have labs drawn then for her appt at the [facility] on 3/19. Please advise if this is ok, and what labs you would like.

## 2020-02-02 NOTE — Telephone Encounter (Signed)
The labs have been ordered .  Patient needs to call to make lab appt.

## 2020-02-03 NOTE — Telephone Encounter (Signed)
Called message and left message for patient that she needs to make appt with PCP for labs.

## 2020-02-03 NOTE — Telephone Encounter (Signed)
Pt has a lab appt scheduled for 02/08/2020.

## 2020-02-05 ENCOUNTER — Ambulatory Visit: Payer: BC Managed Care – PPO | Admitting: Physician Assistant

## 2020-02-05 NOTE — Progress Notes (Signed)
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  INTERVAL HISTORY: Patient returns to clinic today for further evaluation and discussion of her laboratory results.  She continues to feel well and remains asymptomatic.  She has no neurologic complaints.  She denies any weight loss.  She denies any recent fevers or illnesses.  She has no chest pain, shortness of breath, cough, or hemoptysis.  She denies any nausea, vomiting, constipation, or diarrhea. She has no urinary complaints.  Patient feels at her baseline offers no specific complaints today.  REVIEW OF SYSTEMS:   Review of Systems  Constitutional: Negative.  Negative for fever, malaise/fatigue and weight loss.  HENT: Negative.   Respiratory: Negative.  Negative for cough and shortness of breath.   Cardiovascular: Negative.  Negative for chest pain and leg swelling.  Gastrointestinal: Negative.  Negative for abdominal pain.  Genitourinary: Negative.  Negative for dysuria.  Musculoskeletal: Negative.  Negative for back pain.  Skin: Negative.  Negative for rash.  Neurological: Negative.  Negative for tremors, focal weakness, weakness and headaches.  Psychiatric/Behavioral: Negative.  The patient is not nervous/anxious.     As per HPI. Otherwise, a complete review of systems is negative.  PAST MEDICAL HISTORY: Past Medical History:  Diagnosis Date  . Carotid artery occlusion   . Hyperlipidemia   . Hypocalcemia   . hypothyroidism 2007   acquired, secondary to surgery for thyroid Ca  . Migraine    improved since thyroid surgery, 2 years  . multinodular goiter 2007   s/p thyroidectomy  . Peripheral arterial disease  (HCC)    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 Repeat angiography in November of 2014 showed 50% distal aortic stenosis extending into the left common iliac artery which had 60-70% ostial stenosis. No other significant disease. I placed on balloon expandable stent into  left common iliac artery extending slightly into the distal aorta    PAST SURGICAL HISTORY: Past Surgical History:  Procedure Laterality Date  . ABDOMINAL AORTAGRAM N/A 10/14/2013   Procedure: ABDOMINAL [name];  Surgeon: Wellington Hampshire, MD;  Location: [facility];  Service: Cardiovascular;  Laterality: N/A;  . ANGIOPLASTY ILLIAC ARTERY    . CESAREAN SECTION    . NOSE SURGERY    . PERIPHERAL VASCULAR CATHETERIZATION N/A 11/14/2016   Procedure: Abdominal Aortogram;  Surgeon: Wellington Hampshire, MD;  Location: [facility];  Service: Cardiovascular;  Laterality: N/A;  . PERIPHERAL VASCULAR CATHETERIZATION Bilateral 11/14/2016   Procedure: Lower Extremity Angiography;  Surgeon: Wellington Hampshire, MD;  Location: [facility];  Service: Cardiovascular;  Laterality: Bilateral;  limited iliacs only  . PERIPHERAL VASCULAR CATHETERIZATION Left 11/14/2016   Procedure: Peripheral Vascular Intervention;  Surgeon: Wellington Hampshire, MD;  Location: [facility];  Service: Cardiovascular;  Laterality: Left;  common iliac  . SKIN CANCER EXCISION    . THYROID SURGERY  2007   secondary to thyroid cancer    FAMILY HISTORY: Family History  Problem Relation Age of Onset  . Alzheimer's disease Father   . Stroke Mother   . Coronary artery disease Maternal Grandmother   . Heart disease Maternal Grandmother 70  . Coronary artery disease  Paternal Grandmother 45  . Heart disease Paternal Grandmother 27  . Breast cancer Neg Hx     ADVANCED DIRECTIVES (Y/N):  N  HEALTH MAINTENANCE: Social History   Tobacco Use  . Smoking status: Current Every Day  Smoker    Packs/day: 0.50    Years: 20.00    Pack years: 10.00    Types: Cigarettes    Last attempt to quit: 10/28/2011    Years since quitting: 8.2  . Smokeless tobacco: Never Used  Substance Use Topics  . Alcohol use: No  . Drug use: No     Colonoscopy:  PAP:  Bone density:  Lipid panel:  Allergies  Allergen Reactions  . Atorvastatin Other (See Comments)    Myalgias   . Darvocet [Propoxyphene N-Acetaminophen] Nausea And Vomiting  . Demerol Nausea And Vomiting  . Methocarbamol Other (See Comments)    UNKNOWN  . Mirtazapine     Sleeplessness, nausea   . Primidone     Severe insomnia, myoclonus and parathesias   . Ambien [Zolpidem Tartrate] Palpitations    Anxiety , trembling.    Current Outpatient Medications  Medication Sig Dispense Refill  . clopidogrel (PLAVIX) 75 MG tablet TAKE 1 TABLET BY MOUTH ONCE DAILY 90 tablet 1  . Colostrum 500 MG CAPS Take 1 capsule by mouth daily.    Marland Kitchen levothyroxine (SYNTHROID) 112 MCG tablet Take 1 tablet (112 mcg total) by mouth daily before breakfast. 45 tablet 3  . Multiple Vitamin (MULTIVITAMIN WITH MINERALS) TABS tablet Take 1 tablet by mouth daily.    . propranolol (INDERAL) 10 MG tablet TAKE 1 TABLET BY MOUTH 3 TIMES DAILY AS NEEDED FOR TREMOR 90 tablet 1  . rosuvastatin (CRESTOR) 10 MG tablet Take 1 tablet (10 mg total) by mouth daily. 90 tablet 1   No current facility-administered medications for this visit.    OBJECTIVE: Vitals:   02/12/20 1005  BP: (!) 152/66  Pulse: 78  Temp: 98.6 F (37 C)     Body mass index is 17.53 kg/m.    ECOG FS:0 - Asymptomatic  General: Well-developed, well-nourished, no acute distress. Eyes: Pink conjunctiva, anicteric sclera. HEENT: Normocephalic, moist mucous membranes. Lungs: No audible wheezing or coughing. Heart: Regular rate and rhythm. Abdomen: Soft, nontender, no obvious distention. Musculoskeletal: No edema, cyanosis, or clubbing. Neuro: Alert, answering all questions  appropriately. Cranial nerves grossly intact. Skin: No rashes or petechiae noted. Psych: Normal affect.  LAB RESULTS:  Lab Results  Component Value Date   NA 138 02/08/2020   K 3.5 02/08/2020   CL 103 02/08/2020   CO2 28 02/08/2020   GLUCOSE 82 02/08/2020   BUN 11 02/08/2020   CREATININE 0.67 02/08/2020   CALCIUM 9.6 02/08/2020   PROT 7.3 02/08/2020   ALBUMIN 3.8 02/08/2020   AST 17 02/08/2020   ALT 10 02/08/2020   ALKPHOS 87 02/08/2020   BILITOT 0.4 02/08/2020   GFRNONAA >60 02/28/2019   GFRAA >60 02/28/2019    Lab Results  Component Value Date   WBC 10.8 (H) 02/08/2020   NEUTROABS 5.5 02/08/2020   HGB 14.6 02/08/2020   HCT 42.2 02/08/2020   MCV 104.3 (H) 02/08/2020   PLT 229.0 02/08/2020   Lab Results  Component Value Date   IRON 148 (H) 02/08/2020   TIBC 283 10/16/2018   IRONPCTSAT 40.8 02/08/2020   Lab Results  Component Value Date   FERRITIN 35.9 02/08/2020     STUDIES: No results found.  ASSESSMENT: Hereditary hemochromatosis, compound heterozygote for C282Y  and H63D mutations.  PLAN:    1. Hereditary hemochromatosis: Patient's hemoglobin and iron stores continue to be within normal limits.  Ferritin is 36.  Patient states that she is taking "colostrum" over-the-counter to control her iron stores.  No intervention is needed at this time.  Patient does not require phlebotomy.  Continue laboratory work-up per primary care physician.  Return to clinic in 6 months for further evaluation.  If patient requires phlebotomy, she would prefer to have a prescription to take to [facility]. 2.  Elevated liver enzymes: Resolved. 3.  Leukocytosis: Mild, monitor.   Patient expressed understanding and was in agreement with this plan. She also understands that She can call clinic at any time with any questions, concerns, or complaints.    Lloyd Huger, MD   02/12/2020 1:12 PM

## 2020-02-08 ENCOUNTER — Other Ambulatory Visit (INDEPENDENT_AMBULATORY_CARE_PROVIDER_SITE_OTHER): Payer: BC Managed Care – PPO

## 2020-02-08 ENCOUNTER — Ambulatory Visit (INDEPENDENT_AMBULATORY_CARE_PROVIDER_SITE_OTHER): Payer: Self-pay

## 2020-02-08 ENCOUNTER — Other Ambulatory Visit: Payer: Self-pay | Admitting: Internal Medicine

## 2020-02-08 ENCOUNTER — Other Ambulatory Visit: Payer: Self-pay

## 2020-02-08 DIAGNOSIS — E032 Hypothyroidism due to medicaments and other exogenous substances: Secondary | ICD-10-CM

## 2020-02-08 DIAGNOSIS — L578 Other skin changes due to chronic exposure to nonionizing radiation: Secondary | ICD-10-CM

## 2020-02-08 DIAGNOSIS — E278 Other specified disorders of adrenal gland: Secondary | ICD-10-CM | POA: Diagnosis not present

## 2020-02-08 DIAGNOSIS — K700 Alcoholic fatty liver: Secondary | ICD-10-CM

## 2020-02-08 DIAGNOSIS — L988 Other specified disorders of the skin and subcutaneous tissue: Secondary | ICD-10-CM

## 2020-02-08 LAB — COMPREHENSIVE METABOLIC PANEL
ALT: 10 U/L (ref 0–35)
AST: 17 U/L (ref 0–37)
Albumin: 3.8 g/dL (ref 3.5–5.2)
Alkaline Phosphatase: 87 U/L (ref 39–117)
BUN: 11 mg/dL (ref 6–23)
CO2: 28 mEq/L (ref 19–32)
Calcium: 9.6 mg/dL (ref 8.4–10.5)
Chloride: 103 mEq/L (ref 96–112)
Creatinine, Ser: 0.67 mg/dL (ref 0.40–1.20)
GFR: 88.44 mL/min (ref >60.00)
Glucose, Bld: 82 mg/dL (ref 70–99)
Potassium: 3.5 mEq/L (ref 3.5–5.1)
Sodium: 138 mEq/L (ref 135–145)
Total Bilirubin: 0.4 mg/dL (ref 0.2–1.2)
Total Protein: 7.3 g/dL (ref 6.0–8.3)

## 2020-02-08 LAB — CBC WITH DIFFERENTIAL/PLATELET
Basophils Absolute: 0.1 10*3/uL (ref 0.0–0.1)
Basophils Relative: 0.8 % (ref 0.0–3.0)
Eosinophils Absolute: 0.3 10*3/uL (ref 0.0–0.7)
Eosinophils Relative: 2.6 % (ref 0.0–5.0)
HCT: 42.2 % (ref 36.0–46.0)
Hemoglobin: 14.6 g/dL (ref 12.0–15.0)
Lymphocytes Relative: 36.5 % (ref 12.0–46.0)
Lymphs Abs: 3.9 10*3/uL (ref 0.7–4.0)
MCHC: 34.7 g/dL (ref 30.0–36.0)
MCV: 104.3 fl — ABNORMAL HIGH (ref 78.0–100.0)
Monocytes Absolute: 0.9 10*3/uL (ref 0.1–1.0)
Monocytes Relative: 8.7 % (ref 3.0–12.0)
Neutro Abs: 5.5 10*3/uL (ref 1.4–7.7)
Neutrophils Relative %: 51.4 % (ref 43.0–77.0)
Platelets: 229 10*3/uL (ref 150.0–400.0)
RBC: 4.05 Mil/uL (ref 3.87–5.11)
RDW: 12.6 % (ref 11.5–15.5)
WBC: 10.8 10*3/uL — ABNORMAL HIGH (ref 4.0–10.5)

## 2020-02-08 LAB — IBC + FERRITIN
Ferritin: 35.9 ng/mL (ref 10.0–291.0)
Iron: 148 ug/dL — ABNORMAL HIGH (ref 42–145)
Saturation Ratios: 40.8 % (ref 20.0–50.0)
Transferrin: 259 mg/dL (ref 212.0–360.0)

## 2020-02-08 LAB — T4, FREE: Free T4: 1.44 ng/dL (ref 0.60–1.60)

## 2020-02-09 LAB — TSH: TSH: 0.07 u[IU]/mL — ABNORMAL LOW (ref 0.35–4.50)

## 2020-02-10 ENCOUNTER — Other Ambulatory Visit: Payer: Self-pay | Admitting: Internal Medicine

## 2020-02-10 DIAGNOSIS — E032 Hypothyroidism due to medicaments and other exogenous substances: Secondary | ICD-10-CM

## 2020-02-10 LAB — ETHANOL: Ethanol: <0.01 %

## 2020-02-10 MED ORDER — LEVOTHYROXINE SODIUM 112 MCG PO TABS: 112.0000 ug | ORAL_TABLET | Freq: Every day | ORAL | 3 refills | Status: DC

## 2020-02-10 NOTE — Addendum Note (Signed)
Addended by: Leeanne Rio on: 02/10/2020 11:20 AM   Modules accepted: Orders

## 2020-02-12 ENCOUNTER — Encounter: Payer: Self-pay | Admitting: Oncology

## 2020-02-12 ENCOUNTER — Other Ambulatory Visit: Payer: Self-pay

## 2020-02-12 ENCOUNTER — Inpatient Hospital Stay: Payer: BC Managed Care – PPO | Attending: Oncology | Admitting: Oncology

## 2020-02-12 DIAGNOSIS — Z85850 Personal history of malignant neoplasm of thyroid: Secondary | ICD-10-CM | POA: Insufficient documentation

## 2020-02-12 DIAGNOSIS — Z85828 Personal history of other malignant neoplasm of skin: Secondary | ICD-10-CM | POA: Diagnosis not present

## 2020-02-12 DIAGNOSIS — D72829 Elevated white blood cell count, unspecified: Secondary | ICD-10-CM | POA: Insufficient documentation

## 2020-02-12 DIAGNOSIS — E039 Hypothyroidism, unspecified: Secondary | ICD-10-CM | POA: Insufficient documentation

## 2020-02-12 DIAGNOSIS — Z8249 Family history of ischemic heart disease and other diseases of the circulatory system: Secondary | ICD-10-CM | POA: Insufficient documentation

## 2020-02-12 DIAGNOSIS — I739 Peripheral vascular disease, unspecified: Secondary | ICD-10-CM | POA: Diagnosis not present

## 2020-02-12 DIAGNOSIS — E785 Hyperlipidemia, unspecified: Secondary | ICD-10-CM | POA: Insufficient documentation

## 2020-02-12 DIAGNOSIS — Z79899 Other long term (current) drug therapy: Secondary | ICD-10-CM | POA: Insufficient documentation

## 2020-02-12 DIAGNOSIS — F17210 Nicotine dependence, cigarettes, uncomplicated: Secondary | ICD-10-CM | POA: Diagnosis not present

## 2020-02-22 ENCOUNTER — Other Ambulatory Visit: Payer: Self-pay

## 2020-02-22 ENCOUNTER — Other Ambulatory Visit: Payer: Self-pay | Admitting: Cardiovascular Disease

## 2020-02-22 ENCOUNTER — Ambulatory Visit (INDEPENDENT_AMBULATORY_CARE_PROVIDER_SITE_OTHER): Payer: BC Managed Care – PPO

## 2020-02-22 DIAGNOSIS — I739 Peripheral vascular disease, unspecified: Secondary | ICD-10-CM

## 2020-02-22 DIAGNOSIS — Z95828 Presence of other vascular implants and grafts: Secondary | ICD-10-CM

## 2020-02-23 LAB — METANEPHRINES, PLASMA
Metanephrine, Free: 65 pg/mL — ABNORMAL HIGH (ref <=57)
Normetanephrine, Free: 150 pg/mL — ABNORMAL HIGH (ref <=148)
Total Metanephrines-Plasma: 215 pg/mL — ABNORMAL HIGH (ref <=205)

## 2020-02-23 LAB — CATECHOLAMINES, FRACTIONATED, PLASMA
Dopamine: 17 pg/mL
Epinephrine: 42 pg/mL
Norepinephrine: 744 pg/mL
Total Catecholamines: 803 pg/mL

## 2020-02-23 LAB — ALDOSTERONE + RENIN ACTIVITY W/ RATIO
ALDO / PRA Ratio: 23.8 Ratio (ref 0.9–28.9)
Aldosterone: 5 ng/dL
Renin Activity: 0.21 ng/mL/h — ABNORMAL LOW (ref 0.25–5.82)

## 2020-02-23 LAB — EXTRA SPECIMEN

## 2020-02-23 LAB — POTASSIUM: Potassium: 3.5 mmol/L (ref 3.5–5.3)

## 2020-02-26 NOTE — Progress Notes (Signed)
Cardiology Office Note    Date:  02/29/2020   ID:  Tracie Morris, DOB Apr 29, 1956, MRN [id number]  PCP:  [name], MD  Cardiologist:  [name], MD  Electrophysiologist:  None   Chief Complaint: Follow up  History of Present Illness:   Tracie Morris is a 64 y.o. 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   Prior CT scan has demonstrated coronary artery calcification. She was most recently seen by Dr. Fletcher Anon in 06/2019 for follow up with noted significant weight loss previously attributed to excessive alcohol use with poor po intake. She attended rehab in [place] and was successful in quitting alcohol. With some weight gain, she was feeling better. She denied any lower extremity claudication. She continued to smoke. She has been evaluated by endocrinology for an incidentally noted adrenal mass with workup being unrevealing. Most recent noninvasive lower extremity imaging from 02/22/2020 showed normal ABIs and a patent iliac stent as outlined below.   She comes in doing well from a cardiac perspective.  No chest pain, shortness of breath, dizziness, presyncope, syncope.  She denies any lifestyle limiting claudication symptoms and can ambulate without limitation.  She is tolerating Plavix without issues.  She is interested in retrying Chantix today for smoking cessation.  She continues to abstain from alcohol.  She has not needed any recent phlebotomy for hemochromatosis and attributes this to colostrum.   Labs independently reviewed: 01/2020 - TSH 0.07, free T4 normal, potassium 3.5, BUN 11, SCr 0.67, albumin 3.8, AST/ALT normal, HGB 14.6, PLT 229 01/2019 - TC 227, TG 148, HDL 77, LDL 120  Past Medical History:  Diagnosis Date  . Carotid artery occlusion   .  Hyperlipidemia   . Hypocalcemia   . hypothyroidism 2007   acquired, secondary to surgery for thyroid Ca  . Migraine    improved since thyroid surgery, 2 years  . multinodular goiter 2007   s/p thyroidectomy  . 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 Repeat angiography in November of 2014 showed 50% distal aortic stenosis extending into the left common iliac artery which had 60-70% ostial stenosis. No other significant disease. I placed on balloon expandable stent into  left common iliac artery extending slightly into the distal aorta    Past Surgical History:  Procedure Laterality Date  . ABDOMINAL AORTAGRAM N/A 10/14/2013   Procedure: ABDOMINAL [name];  Surgeon: Wellington Hampshire, MD;  Location: [facility];  Service: Cardiovascular;  Laterality: N/A;  . ANGIOPLASTY ILLIAC ARTERY    . CESAREAN SECTION    . NOSE SURGERY    . PERIPHERAL VASCULAR CATHETERIZATION N/A 11/14/2016   Procedure: Abdominal Aortogram;  Surgeon: Wellington Hampshire, MD;  Location: [facility];  Service: Cardiovascular;  Laterality: N/A;  . PERIPHERAL VASCULAR CATHETERIZATION Bilateral 11/14/2016   Procedure: Lower Extremity Angiography;  Surgeon: Wellington Hampshire, MD;  Location: [facility];  Service: Cardiovascular;  Laterality: Bilateral;  limited iliacs only  . PERIPHERAL VASCULAR CATHETERIZATION Left 11/14/2016   Procedure: Peripheral Vascular Intervention;  Surgeon: Wellington Hampshire, MD;  Location: [facility];  Service: Cardiovascular;  Laterality: Left;  common iliac  . SKIN CANCER EXCISION    .  THYROID SURGERY  2007   secondary to thyroid cancer    Current Medications: Current Meds  Medication Sig  . clopidogrel (PLAVIX) 75 MG tablet TAKE 1 TABLET BY MOUTH ONCE DAILY  . Colostrum 500 MG CAPS Take 1 capsule by mouth daily.  Marland Kitchen levothyroxine (SYNTHROID) 112 MCG tablet Take 1  tablet (112 mcg total) by mouth daily before breakfast.  . Multiple Vitamin (MULTIVITAMIN WITH MINERALS) TABS tablet Take 1 tablet by mouth daily.  . propranolol (INDERAL) 10 MG tablet TAKE 1 TABLET BY MOUTH 3 TIMES DAILY AS NEEDED FOR TREMOR  . rosuvastatin (CRESTOR) 10 MG tablet Take 1 tablet (10 mg total) by mouth daily.    Allergies:   Atorvastatin, Darvocet [propoxyphene n-acetaminophen], Demerol, Methocarbamol, Mirtazapine, Primidone, and Ambien [zolpidem tartrate]   Social History   Socioeconomic History  . Marital status: Married    Spouse name: Not on file  . Number of children: Not on file  . Years of education: Not on file  . Highest education level: Not on file  Occupational History  . Not on file  Tobacco Use  . Smoking status: Current Every Day Smoker    Packs/day: 0.50    Years: 20.00    Pack years: 10.00    Types: Cigarettes    Last attempt to quit: 10/28/2011    Years since quitting: 8.3  . Smokeless tobacco: Never Used  Substance and Sexual Activity  . Alcohol use: No  . Drug use: No  . Sexual activity: Not on file  Other Topics Concern  . Not on file  Social History Narrative  . Not on file   Social Determinants of Health   Financial Resource Strain:   . Difficulty of Paying Living Expenses:   Food Insecurity:   . Worried About Charity fundraiser in the Last Year:   . Arboriculturist in the Last Year:   Transportation Needs:   . Film/video editor (Medical):   Marland Kitchen Lack of Transportation (Non-Medical):   Physical Activity:   . Days of Exercise per Week:   . Minutes of Exercise per Session:   Stress:   . Feeling of Stress :   Social Connections:   . Frequency of Communication with Friends and Family:   . Frequency of Social Gatherings with Friends and Family:   . Attends Religious Services:   . Active Member of Clubs or Organizations:   . Attends Archivist Meetings:   Marland Kitchen Marital Status:      Family History:  The patient's  family history includes Alzheimer's disease in her father; Coronary artery disease in her maternal grandmother; Coronary artery disease (age of onset: 38) in her paternal grandmother; Heart disease (age of onset: 11) in her maternal grandmother; Heart disease (age of onset: 34) in her paternal grandmother; Stroke in her mother. There is no history of Breast cancer.  ROS:   Review of Systems  Constitutional: Negative for chills, diaphoresis, fever, malaise/fatigue and weight loss.  HENT: Negative for congestion.   Eyes: Negative for discharge and redness.  Respiratory: Negative for cough, sputum production, shortness of breath and wheezing.   Cardiovascular: Negative for chest pain, palpitations, orthopnea, claudication, leg swelling and PND.  Gastrointestinal: Negative for abdominal pain, heartburn, nausea and vomiting.  Musculoskeletal: Negative for falls and myalgias.  Skin: Negative for rash.  Neurological: Negative for dizziness, tingling, tremors, sensory change, speech change, focal weakness, loss of consciousness and weakness.  Endo/Heme/Allergies: Does not bruise/bleed easily.  Psychiatric/Behavioral:  Negative for substance abuse. The patient is not nervous/anxious.   All other systems reviewed and are negative.    EKGs/Labs/Other Studies Reviewed:    Studies reviewed were summarized above. The additional studies were reviewed today:  Bilateral ABIs 02/22/2020: Bilateral ABIs appear essentially unchanged. Left TBIs appear increased.    Summary:  Right: Resting right ankle-brachial index is within normal range. No  evidence of significant right lower extremity arterial disease. The right  toe-brachial index is normal.   Left: Resting left ankle-brachial index is within normal range. No  evidence of significant left lower extremity arterial disease. The left  toe-brachial index is abnormal.  __________  Aorta/IVC/Iliac 02/22/2020: Summary:  Abdominal Aorta: No evidence of  an abdominal aortic aneurysm was  visualized.  Stenosis: +-----------------+-----------+  Location     Stent     +-----------------+-----------+  Left Common Iliacno stenosis  +-----------------+-----------+   Heavy wall calcifications throughout aorta and iliac arteries.   IVC/Iliac: There is no evidence of thrombus involving the IVC.    *See table(s) above for measurements and observations.  Suggest follow up study in 12 months.    EKG:  EKG is ordered today.  The EKG ordered today demonstrates NSR, 83 bpm, right axis deviation, no acute ST-T changes, largely unchanged when compared to prior study  Recent Labs: 02/08/2020: ALT 10; BUN 11; Creatinine, Ser 0.67; Hemoglobin 14.6; Platelets 229.0; Potassium 3.5; Sodium 138; TSH 0.07  Recent Lipid Panel    Component Value Date/Time   CHOL 227 (H) 02/23/2019 0923   TRIG 148.0 02/23/2019 0923   HDL 77.30 02/23/2019 0923   CHOLHDL 3 02/23/2019 0923   VLDL 29.6 02/23/2019 0923   LDLCALC 120 (H) 02/23/2019 0923    PHYSICAL EXAM:    VS:  BP 140/70 (BP Location: Left Arm, Patient Position: Sitting, Cuff Size: Normal)   Pulse 83   Ht 5' 7.5" (1.715 m)   Wt 114 lb 2 oz (51.8 kg)   SpO2 98%   BMI 17.61 kg/m   BMI: Body mass index is 17.61 kg/m.  Physical Exam  Constitutional: She is oriented to person, place, and time. She appears well-developed and well-nourished.  HENT:  Head: Normocephalic and atraumatic.  Eyes: Right eye exhibits no discharge. Left eye exhibits no discharge.  Neck: No JVD present.  Cardiovascular: Normal rate, regular rhythm, S1 normal, S2 normal and normal heart sounds. Exam reveals no distant heart sounds, no friction rub, no midsystolic click and no opening snap.  No murmur heard. Pulses:      Carotid pulses are on the right side with bruit.      Dorsalis pedis pulses are 2+ on the right side and 2+ on the left side.       Posterior tibial pulses are 2+ on the right side and 2+ on the left  side.  Pulmonary/Chest: Effort normal and breath sounds normal. No respiratory distress. She has no decreased breath sounds. She has no wheezes. She has no rales. She exhibits no tenderness.  Abdominal: Soft. She exhibits no distension. There is no abdominal tenderness.  Musculoskeletal:        General: No edema.     Cervical back: Normal range of motion.  Neurological: She is alert and oriented to person, place, and time.  Skin: Skin is warm and dry. No cyanosis. Nails show no clubbing.  Psychiatric: She has a normal mood and affect. Her speech is normal and behavior is normal. Judgment and thought content normal.    Wt  Readings from Last 3 Encounters:  02/29/20 114 lb 2 oz (51.8 kg)  02/12/20 113 lb 9.6 oz (51.5 kg)  07/14/19 108 lb (49 kg)     ASSESSMENT & PLAN:   1. PAD: She denies any symptoms concerning for claudication.  Most recent noninvasive imaging demonstrated normal ABIs bilaterally with patent iliac stent.  Continue treatment with clopidogrel indefinitely.  No symptoms concerning for bleeding.  Recent CBC demonstrated normal hemoglobin.  Complete smoking cessation is recommended as outlined below.  2. HLD: LDL of 120 from 01/2019.  Most recent AST/ALT from last month normal.  Check lipid panel today.  If LDL remains above goal recommend escalation of Crestor to 20 mg daily.  3. Carotid artery disease: Prior carotid artery ultrasound from 2018 showed 1 to 39% bilateral ICA stenosis.  Right-sided bruit noted on exam today.  Update carotid artery ultrasound.  Check lipid panel as outlined above with recommendation to titrate Crestor as indicated.  4. Coronary artery calcification involving the native coronary arteries: No symptoms concerning for angina.  She remains on Plavix indefinitely as well as Crestor as outlined above.  Continue aggressive treatment of risk factors including complete smoking cessation.  No plans for ischemic evaluation at this time.  5. Tobacco use: She  requests a prescription for Chantix today.  Indicates she has previously used this and tolerated without issues.  Precautions discussed.  Starter pack prescription supplied.  She will contact our office for continuing dose pack.  Complete cessation is recommended.  Disposition: F/u with Dr. Fletcher Anon or an APP in 6 months.   Medication Adjustments/Labs and Tests Ordered: Current medicines are reviewed at length with the patient today.  Concerns regarding medicines are outlined above. Medication changes, Labs and Tests ordered today are summarized above and listed in the Patient Instructions accessible in Encounters.   Signed, Christell Faith, PA-C 02/29/2020 11:27 AM     [facility] [address] [place], [place] [address] [phone]

## 2020-02-29 ENCOUNTER — Other Ambulatory Visit: Payer: Self-pay

## 2020-02-29 ENCOUNTER — Encounter: Payer: Self-pay | Admitting: Physician Assistant

## 2020-02-29 ENCOUNTER — Ambulatory Visit (INDEPENDENT_AMBULATORY_CARE_PROVIDER_SITE_OTHER): Payer: BC Managed Care – PPO | Admitting: Physician Assistant

## 2020-02-29 VITALS — BP 140/70 | HR 83 | Ht 67.5 in | Wt 114.1 lb

## 2020-02-29 DIAGNOSIS — I2584 Coronary atherosclerosis due to calcified coronary lesion: Secondary | ICD-10-CM | POA: Diagnosis not present

## 2020-02-29 DIAGNOSIS — I2510 Atherosclerotic heart disease of native coronary artery without angina pectoris: Secondary | ICD-10-CM

## 2020-02-29 DIAGNOSIS — E785 Hyperlipidemia, unspecified: Secondary | ICD-10-CM | POA: Diagnosis not present

## 2020-02-29 DIAGNOSIS — I779 Disorder of arteries and arterioles, unspecified: Secondary | ICD-10-CM | POA: Diagnosis not present

## 2020-02-29 DIAGNOSIS — R0989 Other specified symptoms and signs involving the circulatory and respiratory systems: Secondary | ICD-10-CM | POA: Diagnosis not present

## 2020-02-29 DIAGNOSIS — Z720 Tobacco use: Secondary | ICD-10-CM

## 2020-02-29 DIAGNOSIS — I739 Peripheral vascular disease, unspecified: Secondary | ICD-10-CM

## 2020-02-29 MED ORDER — CHANTIX STARTING MONTH PAK 0.5 MG X 11 & 1 MG X 42 PO TABS: ORAL_TABLET | ORAL | 0 refills | Status: DC

## 2020-02-29 NOTE — Patient Instructions (Signed)
Medication Instructions:   1.  START Chantix-  Take one 0.5 mg tablet by mouth once daily for 3 days, then increase to one 0.5 mg tablet twice daily for 4 days, then increase to one 1 mg tablet twice daily.  2. When you are almost done with this please call the office and we will send in the continuation pack.   *If you need a refill on your cardiac medications before your next appointment, please call your pharmacy*   Lab Work:  1. Your physician recommends that you have lab work today(LIPID)  If you have labs (blood work) drawn today and your tests are completely normal, you will receive your results only by: Marland Kitchen MyChart Message (if you have MyChart) OR . A paper copy in the mail If you have any lab test that is abnormal or we need to change your treatment, we will call you to review the results.   Testing/Procedures:  1. Your physician has requested that you have a carotid duplex. This test is an ultrasound of the carotid arteries in your neck. It looks at blood flow through these arteries that supply the brain with blood. Allow one hour for this exam. There are no restrictions or special instructions.  Follow-Up: At [facility], you and your health needs are our priority.  As part of our continuing mission to provide you with exceptional heart care, we have created designated Provider Care Teams.  These Care Teams include your primary Cardiologist (physician) and Advanced Practice Providers (APPs -  Physician Assistants and Nurse Practitioners) who all work together to provide you with the care you need, when you need it.  We recommend signing up for the patient portal called "MyChart".  Sign up information is provided on this After Visit Summary.  MyChart is used to connect with patients for Virtual Visits (Telemedicine).  Patients are able to view lab/test results, encounter notes, upcoming appointments, etc.  Non-urgent messages can be sent to your provider as well.   To learn  more about what you can do with MyChart, go to [email or web address].    Your next appointment:   6 month(s)  The format for your next appointment:   In Person  Provider:    You may see [name], MD or one of the following Advanced Practice Providers on your designated Care Team:    Nong Hodgkins, NP  Christell Faith, PA-C  Marrianne Mood, PA-C

## 2020-03-01 ENCOUNTER — Telehealth: Payer: Self-pay

## 2020-03-01 DIAGNOSIS — I2510 Atherosclerotic heart disease of native coronary artery without angina pectoris: Secondary | ICD-10-CM

## 2020-03-01 DIAGNOSIS — I2583 Coronary atherosclerosis due to lipid rich plaque: Secondary | ICD-10-CM

## 2020-03-01 LAB — LIPID PANEL
Chol/HDL Ratio: 4.3 ratio (ref 0.0–4.4)
Cholesterol, Total: 196 mg/dL (ref 100–199)
HDL: 46 mg/dL (ref >39)
LDL Chol Calc (NIH): 133 mg/dL — ABNORMAL HIGH (ref 0–99)
Triglycerides: 96 mg/dL (ref 0–149)
VLDL Cholesterol Cal: 17 mg/dL (ref 5–40)

## 2020-03-01 NOTE — Telephone Encounter (Signed)
-----   Message from Rise Mu, PA-C sent at 03/01/2020  7:29 AM EDT ----- LDL above goal at 133. Recent liver function normal. Please increase Crestor to 20 mg daily. Follow-up fasting lipid panel and liver function in 8 weeks.

## 2020-03-01 NOTE — Telephone Encounter (Signed)
Attempted to call patient. LMTCB 03/01/2020

## 2020-03-07 MED ORDER — ROSUVASTATIN CALCIUM 20 MG PO TABS: 20.0000 mg | ORAL_TABLET | Freq: Every day | ORAL | 3 refills | Status: DC

## 2020-03-07 NOTE — Telephone Encounter (Signed)
Call to patient to review labs.    Pt verbalized understanding and has no further questions at this time.    Advised pt to call for any further questions or concerns.  Updated orders per request.

## 2020-03-07 NOTE — Telephone Encounter (Signed)
Patient calling to discuss recent testing results  ° °Please call  ° °

## 2020-03-07 NOTE — Telephone Encounter (Signed)
Attempted to call patient. LMTCB 03/07/2020   

## 2020-03-26 ENCOUNTER — Other Ambulatory Visit: Payer: Self-pay | Admitting: Internal Medicine

## 2020-04-08 ENCOUNTER — Ambulatory Visit (INDEPENDENT_AMBULATORY_CARE_PROVIDER_SITE_OTHER): Payer: BC Managed Care – PPO

## 2020-04-08 ENCOUNTER — Other Ambulatory Visit: Payer: Self-pay

## 2020-04-08 DIAGNOSIS — R0989 Other specified symptoms and signs involving the circulatory and respiratory systems: Secondary | ICD-10-CM | POA: Diagnosis not present

## 2020-04-11 ENCOUNTER — Telehealth: Payer: Self-pay

## 2020-04-11 NOTE — Telephone Encounter (Signed)
Attempted to call patient. LMTCB 04/11/2020

## 2020-04-11 NOTE — Telephone Encounter (Signed)
-----   Message from Rise Mu, PA-C sent at 04/08/2020 10:46 AM EDT ----- Carotid artery ultrasound showed 1-39% bilateral narrowing. Reassuring study.

## 2020-04-15 NOTE — Telephone Encounter (Signed)
Call to patient to review carotid u/s results.    Pt verbalized understanding and has no further questions at this time.    Advised pt to call for any further questions or concerns.  No further orders.   

## 2020-05-20 ENCOUNTER — Encounter: Payer: Self-pay | Admitting: Internal Medicine

## 2020-05-20 ENCOUNTER — Other Ambulatory Visit: Payer: Self-pay | Admitting: Internal Medicine

## 2020-05-20 ENCOUNTER — Other Ambulatory Visit: Payer: Self-pay

## 2020-05-20 ENCOUNTER — Other Ambulatory Visit (INDEPENDENT_AMBULATORY_CARE_PROVIDER_SITE_OTHER): Payer: BC Managed Care – PPO

## 2020-05-20 DIAGNOSIS — E032 Hypothyroidism due to medicaments and other exogenous substances: Secondary | ICD-10-CM | POA: Diagnosis not present

## 2020-05-20 LAB — TSH: TSH: 0.03 u[IU]/mL — ABNORMAL LOW (ref 0.35–4.50)

## 2020-05-20 LAB — T4, FREE: Free T4: 1.3 ng/dL (ref 0.60–1.60)

## 2020-05-20 MED ORDER — LEVOTHYROXINE SODIUM 100 MCG PO TABS: 100.0000 ug | ORAL_TABLET | Freq: Every day | ORAL | 3 refills | Status: DC

## 2020-06-06 ENCOUNTER — Ambulatory Visit (INDEPENDENT_AMBULATORY_CARE_PROVIDER_SITE_OTHER): Payer: BC Managed Care – PPO | Admitting: Dermatology

## 2020-06-06 ENCOUNTER — Other Ambulatory Visit: Payer: Self-pay

## 2020-06-06 DIAGNOSIS — Z872 Personal history of diseases of the skin and subcutaneous tissue: Secondary | ICD-10-CM

## 2020-06-06 DIAGNOSIS — D2222 Melanocytic nevi of left ear and external auricular canal: Secondary | ICD-10-CM

## 2020-06-06 DIAGNOSIS — Z85828 Personal history of other malignant neoplasm of skin: Secondary | ICD-10-CM

## 2020-06-06 DIAGNOSIS — D229 Melanocytic nevi, unspecified: Secondary | ICD-10-CM | POA: Diagnosis not present

## 2020-06-06 DIAGNOSIS — L814 Other melanin hyperpigmentation: Secondary | ICD-10-CM

## 2020-06-06 DIAGNOSIS — Z1283 Encounter for screening for malignant neoplasm of skin: Secondary | ICD-10-CM

## 2020-06-06 DIAGNOSIS — Z86007 Personal history of in-situ neoplasm of skin: Secondary | ICD-10-CM | POA: Diagnosis not present

## 2020-06-06 DIAGNOSIS — L821 Other seborrheic keratosis: Secondary | ICD-10-CM

## 2020-06-06 DIAGNOSIS — D1800 Hemangioma unspecified site: Secondary | ICD-10-CM

## 2020-06-06 DIAGNOSIS — D2262 Melanocytic nevi of left upper limb, including shoulder: Secondary | ICD-10-CM

## 2020-06-06 DIAGNOSIS — L578 Other skin changes due to chronic exposure to nonionizing radiation: Secondary | ICD-10-CM

## 2020-06-06 NOTE — Progress Notes (Signed)
   Follow-Up Visit   Subjective  Tracie Morris is a 64 y.o. female who presents for the following: Annual Exam (Total body skin exam, hx of BCC, SCC,AKs).  Recently had Mohs on nose at [facility].  Getting dermabrasion on it to help with the bumpy scar.   The following portions of the chart were reviewed this encounter and updated as appropriate:      Review of Systems:  No other skin or systemic complaints except as noted in HPI or Assessment and Plan.  Objective  Well appearing patient in no apparent distress; mood and affect are within normal limits.  A full examination was performed including scalp, head, eyes, ears, nose, lips, neck, chest, axillae, abdomen, back, buttocks, bilateral upper extremities, bilateral lower extremities, hands, feet, fingers, toes, fingernails, and toenails. All findings within normal limits unless otherwise noted below.  Objective  Central nasal tip, Left lower abdomen: Well healed scars with no evidence of recurrence.   Objective  Right nasal tip: Well healed scar with no evidence of recurrence.   Objective    L antihelix: 2.24mm med dark brown macule  Left Upper Arm - Anterior: 2.65mm dark brown macule  Objective  face: Clear today   Assessment & Plan    Lentigines - Scattered tan macules - Discussed due to sun exposure - Benign, observe - Call for any changes  Seborrheic Keratoses - Stuck-on, waxy, tan-brown papules and plaques  - Discussed benign etiology and prognosis. - Observe - Call for any changes  Melanocytic Nevi - Tan-brown and/or pink-flesh-colored symmetric macules and papules - Benign appearing on exam today - Observation - Call clinic for new or changing moles - Recommend daily use of broad spectrum spf 30+ sunscreen to sun-exposed areas.   Hemangiomas - Red papules - Discussed benign nature - Observe - Call for any changes  Actinic Damage - diffuse scaly erythematous macules with underlying  dyspigmentation chest - Recommend daily broad spectrum sunscreen SPF 30+ to sun-exposed areas, reapply every 2 hours as needed.  - Call for new or changing lesions.  Skin cancer screening performed today.  History of basal cell carcinoma (BCC) (2) Central nasal tip; Left lower abdomen  Clear. Observe for recurrence. Call clinic for new or changing lesions.  Recommend regular skin exams, daily broad-spectrum spf 30+ sunscreen use, and photoprotection.     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 Observe for recurrence. Call clinic for new or changing lesions.  Recommend regular skin exams, daily broad-spectrum spf 30+ sunscreen use, and photoprotection.     Nevus (2) L antihelix; Left Upper Arm - Anterior  Benign-appearing.  Observation.  Call clinic for new or changing moles.  Recommend daily use of broad spectrum spf 30+ sunscreen to sun-exposed areas.    History of actinic keratoses face  Clear. Observe for recurrence. Call clinic for new or changing lesions.  Recommend regular skin exams, daily broad-spectrum spf 30+ sunscreen use, and photoprotection.     Return in about 6 months (around 12/07/2020) for Recheck BCC/SCC sites face, recheck moles L ear, L upper arm.  I, Othelia Pulling, RMA, am acting as scribe for [name], MD . Documentation: I have reviewed the above documentation for accuracy and completeness, and I agree with the above.  [name] MD

## 2020-07-25 ENCOUNTER — Other Ambulatory Visit: Payer: Self-pay | Admitting: Internal Medicine

## 2020-07-25 DIAGNOSIS — R197 Diarrhea, unspecified: Secondary | ICD-10-CM

## 2020-07-29 ENCOUNTER — Ambulatory Visit: Payer: Medicare PPO | Admitting: Internal Medicine

## 2020-07-29 ENCOUNTER — Telehealth: Payer: Self-pay | Admitting: Internal Medicine

## 2020-07-29 ENCOUNTER — Other Ambulatory Visit: Payer: Self-pay

## 2020-07-29 VITALS — BP 128/78 | HR 74 | Temp 98.3°F | Resp 16 | Ht 68.0 in | Wt 109.0 lb

## 2020-07-29 DIAGNOSIS — E032 Hypothyroidism due to medicaments and other exogenous substances: Secondary | ICD-10-CM

## 2020-07-29 DIAGNOSIS — F339 Major depressive disorder, recurrent, unspecified: Secondary | ICD-10-CM

## 2020-07-29 DIAGNOSIS — K732 Chronic active hepatitis, not elsewhere classified: Secondary | ICD-10-CM | POA: Diagnosis not present

## 2020-07-29 DIAGNOSIS — Z931 Gastrostomy status: Secondary | ICD-10-CM

## 2020-07-29 DIAGNOSIS — Z716 Tobacco abuse counseling: Secondary | ICD-10-CM

## 2020-07-29 DIAGNOSIS — R1011 Right upper quadrant pain: Secondary | ICD-10-CM

## 2020-07-29 DIAGNOSIS — E440 Moderate protein-calorie malnutrition: Secondary | ICD-10-CM

## 2020-07-29 DIAGNOSIS — R197 Diarrhea, unspecified: Secondary | ICD-10-CM | POA: Diagnosis not present

## 2020-07-29 DIAGNOSIS — F1011 Alcohol abuse, in remission: Secondary | ICD-10-CM

## 2020-07-29 LAB — COMPREHENSIVE METABOLIC PANEL
ALT: 10 U/L (ref 0–35)
AST: 17 U/L (ref 0–37)
Albumin: 3.9 g/dL (ref 3.5–5.2)
Alkaline Phosphatase: 91 U/L (ref 39–117)
BUN: 9 mg/dL (ref 6–23)
CO2: 28 mEq/L (ref 19–32)
Calcium: 9.1 mg/dL (ref 8.4–10.5)
Chloride: 104 mEq/L (ref 96–112)
Creatinine, Ser: 0.65 mg/dL (ref 0.40–1.20)
GFR: 91.45 mL/min (ref >60.00)
Glucose, Bld: 85 mg/dL (ref 70–99)
Potassium: 3.5 mEq/L (ref 3.5–5.1)
Sodium: 140 mEq/L (ref 135–145)
Total Bilirubin: 0.5 mg/dL (ref 0.2–1.2)
Total Protein: 6.4 g/dL (ref 6.0–8.3)

## 2020-07-29 LAB — LIPASE: Lipase: 38 U/L (ref 11.0–59.0)

## 2020-07-29 NOTE — Telephone Encounter (Signed)
lft vm for pt to call ofc to sch US 

## 2020-07-29 NOTE — Progress Notes (Signed)
Subjective:  Patient ID: Tracie Morris, female    DOB: Sep 09, 1956  Age: 64 y.o. MRN: [id number]  CC: The primary encounter diagnosis was Diarrhea, unspecified type. Diagnoses of RUQ pain, Episode of recurrent major depressive disorder, unspecified depression episode severity ([place]), Chronic active hepatitis ([place]), Moderate protein-calorie malnutrition ([place]), History of alcohol abuse, S/P Nissen fundoplication (with gastrostomy tube placement) ([place]), Tobacco abuse counseling, and Iatrogenic hypothyroidism were also pertinent to this visit.  HPI Tracie Morris presents forevaluation of persistent diarrhea. Patient was last seen march 2020 prior to entering rehab for alcoholism   This visit occurred during the SARS-CoV-2 public health emergency.  Safety protocols were in place, including screening questions prior to the visit, additional usage of staff PPE, and extensive cleaning of exam room while observing appropriate contact time as indicated for disinfecting solutions.    Patient has received both the [place] 19 vaccine without complications.  Patient continues to mask when outside of the home except when walking in yard or at safe distances from others .  Patient denies any change in mood or development of unhealthy behaviors resuting from the pandemic's restriction of activities and socialization.     Cc: watery diarrhea  x 3 weeks.  6 watery ,pale stools per day .  Occurring only during waking hours, 2-3 episodes per day, always post prandial cramping ,  Taking probiotics and CBD oil.  Had not had any alcohol since she left detox program.    Alcoholism: estimates that she had a total of 2 years of heavy drinking.  Started in 2018.   went to detox in April to may of 2020 . Gained 10 lbs (99 lbs at discharge)  . Has had several febrile illnesses  Since then and has lost 6 lbs  Had a COVID like illness in January but did not get tested, was too ill. Did not go to ER     Started  chantix to help quit,  Wasn't consistent with use, still smoking   Wants to resume lexapro   Vaccinated April 9.    Outpatient Medications Prior to Visit  Medication Sig Dispense Refill  . clopidogrel (PLAVIX) 75 MG tablet TAKE 1 TABLET BY MOUTH ONCE DAILY 90 tablet 1  . Colostrum 500 MG CAPS Take 1 capsule by mouth daily.    Marland Kitchen levothyroxine (SYNTHROID) 100 MCG tablet Take 1 tablet (100 mcg total) by mouth daily before breakfast. 60 tablet 3  . Multiple Vitamin (MULTIVITAMIN WITH MINERALS) TABS tablet Take 1 tablet by mouth daily.    . propranolol (INDERAL) 10 MG tablet TAKE 1 TABLET BY MOUTH 3 TIMES DAILY AS NEEDED FOR TREMOR 90 tablet 1  . rosuvastatin (CRESTOR) 20 MG tablet Take 1 tablet (20 mg total) by mouth daily. 90 tablet 3  . varenicline (CHANTIX STARTING MONTH PAK) 0.5 MG X 11 & 1 MG X 42 tablet Take one 0.5 mg tablet by mouth once daily for 3 days, then increase to one 0.5 mg tablet twice daily for 4 days, then increase to one 1 mg tablet twice daily. 53 tablet 0   No facility-administered medications prior to visit.    Review of Systems;  Patient denies headache, fevers, malaise, , skin rash, eye pain, sinus congestion and sinus pain, sore throat, dysphagia,  hemoptysis , cough, dyspnea, wheezing, chest pain, palpitations, orthopnea, edema, abdominal pain, nausea, melena, constipation, flank pain, dysuria, hematuria, urinary  Frequency, nocturia, numbness, tingling, seizures,  Focal weakness, Loss of consciousness,  Tremor,  insomnia, depression, anxiety, and suicidal ideation.      Objective:  BP 128/78   Pulse 74   Temp 98.3 F (36.8 C)   Resp 16   Ht 5\' 8"  (1.727 m)   Wt 109 lb (49.4 kg)   SpO2 98%   BMI 16.57 kg/m   BP Readings from Last 3 Encounters:  07/29/20 128/78  02/29/20 140/70  02/12/20 (!) 152/66    Wt Readings from Last 3 Encounters:  07/29/20 109 lb (49.4 kg)  02/29/20 114 lb 2 oz (51.8 kg)  02/12/20 113 lb 9.6 oz (51.5 kg)    General  appearance: alert, cooperative and appears stated age Ears: normal TM's and external ear canals both ears Throat: lips, mucosa, and tongue normal; teeth and gums normal Neck: no adenopathy, no carotid bruit, supple, symmetrical, trachea midline and thyroid not enlarged, symmetric, no tenderness/mass/nodules Back: symmetric, no curvature. ROM normal. No CVA tenderness. Lungs: clear to auscultation bilaterally Heart: regular rate and rhythm, S1, S2 normal, no murmur, click, rub or gallop Abdomen: soft, non-tender; bowel sounds normal; no masses,  no organomegaly Pulses: 2+ and symmetric Skin: Skin color, texture, turgor normal. No rashes or lesions Lymph nodes: Cervical, supraclavicular, and axillary nodes normal.  Lab Results  Component Value Date   HGBA1C 4.2 (L) 12/22/2018    Lab Results  Component Value Date   CREATININE 0.65 07/29/2020   CREATININE 0.67 02/08/2020   CREATININE 0.64 02/28/2019    Lab Results  Component Value Date   WBC 10.8 (H) 02/08/2020   HGB 14.6 02/08/2020   HCT 42.2 02/08/2020   PLT 229.0 02/08/2020   GLUCOSE 85 07/29/2020   CHOL 196 02/29/2020   TRIG 96 02/29/2020   HDL 46 02/29/2020   LDLCALC 133 (H) 02/29/2020   ALT 10 07/29/2020   AST 17 07/29/2020   NA 140 07/29/2020   K 3.5 07/29/2020   CL 104 07/29/2020   CREATININE 0.65 07/29/2020   BUN 9 07/29/2020   CO2 28 07/29/2020   TSH 0.03 (L) 05/20/2020   INR 1.0 11/05/2016   HGBA1C 4.2 (L) 12/22/2018   MICROALBUR 0.6 01/11/2014    MM 3D SCREEN BREAST BILATERAL  Result Date: 08/10/2019 CLINICAL DATA:  Screening. EXAM: DIGITAL SCREENING BILATERAL MAMMOGRAM WITH TOMO AND CAD COMPARISON:  Previous exam(s). ACR Breast Density Category c: The breast tissue is heterogeneously dense, which may obscure small masses. FINDINGS: There are no findings suspicious for malignancy. Images were processed with CAD. IMPRESSION: No mammographic evidence of malignancy. A result letter of this screening mammogram  will be mailed directly to the patient. RECOMMENDATION: Screening mammogram in one year. (Code:SM-B-01Y) BI-RADS CATEGORY  1: Negative. Electronically Signed   By: Nolon Nations M.D.   On: 08/10/2019 16:18    Assessment & Plan:   Problem List Items Addressed This Visit      Unprioritized   Chronic active hepatitis ([place])   Diarrhea - Primary    Etiology unclear.  Suspect malabsorption , pancreatic insufficiency given history of alcoholism. Stool studies ordered       Relevant Orders   Clostridium Difficile by PCR   Gastrointestinal Panel by PCR , Stool   Norovirus group 1 & 2 by PCR, stool   Ova and parasite examination   Stool culture   Fecal lactoferrin, quant   Comprehensive metabolic panel (Completed)   Lipase (Completed)   Pancreatic Elastase, Fecal   Episode of recurrent major depressive disorder, unspecified depression episode severity ([place])    Resuming Lexapro  History of alcohol abuse    She has been sober since entering a detox/rehab program in April 2020      Iatrogenic hypothyroidism    Her last thyroid level was overactive in June and Dr. [name] reduced her dose to 100 mcg daily. She is due for repeat and will need to return  Lab Results  Component Value Date   TSH 0.03 (L) 05/20/2020         Relevant Orders   TSH   Protein-calorie malnutrition ([place])    Secondary to alcoholism.  She has been unable to regain due to recurrent illness.  Will refer to nutritionist      S/P Nissen fundoplication (with gastrostomy tube placement) ([place])   Tobacco abuse counseling    Will consider repeat trial of chantix once anxiety is better controlled,  Since she had better success In reduction of use than welbutrin which as aggravated her tremor and her anxiety.    5 minutes was spent in face to face time discussing patient's current tobacco abuse,  Prior attempts to quit, reviewing the current and future hazards to patient's  health,  And assessing and discussing  patient's  level of motivation to quit. Patient was encouraged to consider pharmacotherapy again in the future .        Other Visit Diagnoses    RUQ pain       Relevant Orders   US Abdomen Limited RUQ      I provided  30 minutes of  face-to-face time during this encounter reviewing patient's current problems and past surgeries, labs and imaging studies, providing counseling on the above mentioned problems , and coordination  of care . I have discontinued Len Blalock "Deysha Kamps"'s Chantix Starting Month Pak. I am also having her maintain her multivitamin with minerals, clopidogrel, Colostrum, rosuvastatin, propranolol, and levothyroxine.  No orders of the defined types were placed in this encounter.   Medications Discontinued During This Encounter  Medication Reason  . varenicline (CHANTIX STARTING MONTH PAK) 0.5 MG X 11 & 1 MG X 42 tablet     Follow-up: Return in about 1 week (around 08/05/2020).   [name], MD

## 2020-07-29 NOTE — Patient Instructions (Signed)
PLEASE COLLECT THE STOOL ON Monday AND BRING IT BACK TO THE OFFICE ON Tuesday  THE ULTRASOUND WILL BE SCHEDULED

## 2020-08-01 ENCOUNTER — Encounter: Payer: Self-pay | Admitting: Internal Medicine

## 2020-08-01 ENCOUNTER — Other Ambulatory Visit
Admission: RE | Admit: 2020-08-01 | Discharge: 2020-08-01 | Disposition: A | Discharge status: Home / Self Care | Payer: Medicare PPO | Source: Ambulatory Visit | Attending: Internal Medicine | Admitting: Internal Medicine

## 2020-08-01 DIAGNOSIS — F339 Major depressive disorder, recurrent, unspecified: Secondary | ICD-10-CM | POA: Insufficient documentation

## 2020-08-01 DIAGNOSIS — R197 Diarrhea, unspecified: Secondary | ICD-10-CM | POA: Diagnosis present

## 2020-08-01 DIAGNOSIS — K732 Chronic active hepatitis, not elsewhere classified: Secondary | ICD-10-CM | POA: Insufficient documentation

## 2020-08-01 LAB — GASTROINTESTINAL PANEL BY PCR, STOOL (REPLACES STOOL CULTURE)

## 2020-08-01 NOTE — Addendum Note (Signed)
Addended by: Joycie Peek on: 08/01/2020 11:23 AM   Modules accepted: Orders

## 2020-08-01 NOTE — Assessment & Plan Note (Signed)
Resuming Lexapro

## 2020-08-01 NOTE — Addendum Note (Signed)
Addended by: Joycie Peek on: 08/01/2020 11:24 AM   Modules accepted: Orders

## 2020-08-01 NOTE — Assessment & Plan Note (Signed)
Secondary to alcoholism.  She has been unable to regain due to recurrent illness.  Will refer to nutritionist

## 2020-08-01 NOTE — Assessment & Plan Note (Signed)
Will consider repeat trial of chantix once anxiety is better controlled,  Since she had better success In reduction of use than welbutrin which as aggravated her tremor and her anxiety.    5 minutes was spent in face to face time discussing patient's current tobacco abuse,  Prior attempts to quit, reviewing the current and future hazards to patient's  health,  And assessing and discussing patient's  level of motivation to quit. Patient was encouraged to consider pharmacotherapy again in the future .

## 2020-08-01 NOTE — Assessment & Plan Note (Addendum)
She has been sober since entering a detox/rehab program in April 2020

## 2020-08-01 NOTE — Assessment & Plan Note (Signed)
Etiology unclear.  Suspect malabsorption , pancreatic insufficiency given history of alcoholism. Stool studies ordered

## 2020-08-01 NOTE — Assessment & Plan Note (Addendum)
Her last thyroid level was overactive in June and Dr. [name] reduced her dose to 100 mcg daily. She is due for repeat and will need to return  Lab Results  Component Value Date   TSH 0.03 (L) 05/20/2020

## 2020-08-03 ENCOUNTER — Other Ambulatory Visit: Payer: Self-pay | Admitting: Internal Medicine

## 2020-08-03 ENCOUNTER — Other Ambulatory Visit (INDEPENDENT_AMBULATORY_CARE_PROVIDER_SITE_OTHER): Payer: Medicare PPO

## 2020-08-03 ENCOUNTER — Telehealth: Payer: Self-pay | Admitting: Internal Medicine

## 2020-08-03 DIAGNOSIS — R197 Diarrhea, unspecified: Secondary | ICD-10-CM

## 2020-08-03 LAB — UNLABELED

## 2020-08-03 MED ORDER — ESCITALOPRAM OXALATE 10 MG PO TABS
10.0000 mg | ORAL_TABLET | Freq: Every day | ORAL | 5 refills | Status: DC
Start: 2020-08-03 — End: 2020-12-19

## 2020-08-03 NOTE — Telephone Encounter (Signed)
Pt states that Dr Derrel Nip was going to send Lexapro into the drug store but they have not received it yet. Please advise. Pt uses [facility] and states that she will go to pharmacy by Friday

## 2020-08-03 NOTE — Telephone Encounter (Signed)
Last office note states that pt wants to resume taking Lexapro. Is it okay to send in at the dose she was lasting taking?

## 2020-08-03 NOTE — Addendum Note (Signed)
Addended by: Tor Netters I on: 08/03/2020 11:44 AM   Modules accepted: Orders

## 2020-08-03 NOTE — Telephone Encounter (Signed)
Please start the Lexapro (escitalopram  10 mg tablet ) at 1/2 tablet daily in the evening for the first few days to avoid nausea.  You can increase to a full tablet after 4 days if you havenot developed side effects of nausea.  If the lexapro interferes with your sleep, take it in the morning instead  Please return in  4 weeks ,  Or e mail me to let me know how it is helping your depression

## 2020-08-04 NOTE — Telephone Encounter (Signed)
Spoke with pt and she stated that she was informed of this during her appt. She stated that she has taken before and never had a problem with the medication.

## 2020-08-04 NOTE — Addendum Note (Signed)
Addended by: Tor Netters I on: 08/04/2020 08:57 AM   Modules accepted: Orders

## 2020-08-04 NOTE — Progress Notes (Signed)
Called [facility] and added the test on. There will be a document sent to confirm the add on. Once signed please fax it to [phone] or bring it to [place] to fax to the lab.

## 2020-08-05 ENCOUNTER — Other Ambulatory Visit: Payer: Self-pay

## 2020-08-05 ENCOUNTER — Ambulatory Visit
Admission: RE | Admit: 2020-08-05 | Discharge: 2020-08-05 | Disposition: A | Discharge status: Home / Self Care | Payer: Medicare PPO | Source: Ambulatory Visit | Attending: Internal Medicine | Admitting: Internal Medicine

## 2020-08-05 DIAGNOSIS — R1011 Right upper quadrant pain: Secondary | ICD-10-CM

## 2020-08-06 ENCOUNTER — Other Ambulatory Visit: Payer: Self-pay | Admitting: Internal Medicine

## 2020-08-06 DIAGNOSIS — E440 Moderate protein-calorie malnutrition: Secondary | ICD-10-CM

## 2020-08-06 LAB — NOROVIRUS GROUP 1 & 2 BY PCR, STOOL
Norovirus 1 by PCR: NEGATIVE
Norovirus 2  by PCR: NEGATIVE

## 2020-08-08 LAB — CLOSTRIDIUM DIFFICILE TOXIN B, QUALITATIVE, REAL-TIME PCR: Toxigenic C. Difficile by PCR: NOT DETECTED

## 2020-08-08 LAB — TEST AUTHORIZATION

## 2020-08-08 LAB — FECAL LACTOFERRIN, QUANT
Fecal Lactoferrin: NEGATIVE
MICRO NUMBER:: 10923707
SPECIMEN QUALITY:: ADEQUATE

## 2020-08-10 LAB — TIQ-NTM

## 2020-08-10 LAB — OVA AND PARASITE EXAMINATION
CONCENTRATE RESULT:: NONE SEEN
MICRO NUMBER:: 10923711
SPECIMEN QUALITY:: ADEQUATE
TRICHROME RESULT:: NONE SEEN

## 2020-08-10 LAB — PANCREATIC ELASTASE, FECAL: Pancreatic Elastase-1, Stool: 354 ug Elast./g (ref >200)

## 2020-08-14 NOTE — Progress Notes (Signed)
[place]  [phone] [phone]  ID: Evelena Leyden OB: 1955-12-23  MR#: [id number]  MEQ#:[id number]  Patient Care Team: [name], MD as PCP - General (Internal Medicine) Wellington Hampshire, MD as PCP - Cardiology (Cardiology) [name], MD (Internal Medicine)  CHIEF COMPLAINT: Hereditary hemochromatosis, compound heterozygote for C282Y and H63D mutations.  INTERVAL HISTORY: Patient returns to clinic today for routine 14-month evaluation and repeat laboratory work.  She has had recent bouts of diarrhea and was discovered to have a gallstone as well as a indeterminate liver lesion on ultrasound.  She otherwise has felt well.  She has no neurologic complaints.  She denies any weight loss.  She denies any fevers.  She has no chest pain, shortness of breath, cough, or hemoptysis.  She has no abdominal pain and denies any nausea, vomiting, or constipation.  She has no urinary complaints.  Patient offers no further specific complaints today.  REVIEW OF SYSTEMS:   Review of Systems  Constitutional: Negative.  Negative for fever, malaise/fatigue and weight loss.  HENT: Negative.   Respiratory: Negative.  Negative for cough and shortness of breath.   Cardiovascular: Negative.  Negative for chest pain and leg swelling.  Gastrointestinal: Positive for diarrhea. Negative for abdominal pain.  Genitourinary: Negative.  Negative for dysuria.  Musculoskeletal: Negative.  Negative for back pain.  Skin: Negative.  Negative for rash.  Neurological: Negative.  Negative for tremors, focal weakness, weakness and headaches.  Psychiatric/Behavioral: Negative.  The patient is not nervous/anxious.     As per HPI. Otherwise, a complete review of systems is negative.  PAST MEDICAL HISTORY: Past Medical History:  Diagnosis Date  . Basal cell carcinoma 03/29/2014   L lower abdomen  . Basal cell carcinoma 01/18/2020   central nasal tip  . Carotid artery  occlusion   . Hyperlipidemia   . Hypocalcemia   . hypothyroidism 2007   acquired, secondary to surgery for thyroid Ca  . Migraine    improved since thyroid surgery, 2 years  . multinodular goiter 2007   s/p thyroidectomy  . 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 Repeat angiography in November of 2014 showed 50% distal aortic stenosis extending into the left common iliac artery which had 60-70% ostial stenosis. No other significant disease. I placed on balloon expandable stent into  left common iliac artery extending slightly into the distal aorta  . Squamous cell carcinoma of skin 06/23/2018   R nasal tip/in situ    PAST SURGICAL HISTORY: Past Surgical History:  Procedure Laterality Date  . ABDOMINAL AORTAGRAM N/A 10/14/2013   Procedure: ABDOMINAL [name];  Surgeon: Wellington Hampshire, MD;  Location: [facility];  Service: Cardiovascular;  Laterality: N/A;  . ANGIOPLASTY ILLIAC ARTERY    . CESAREAN SECTION    . NOSE SURGERY    . PERIPHERAL VASCULAR CATHETERIZATION N/A 11/14/2016   Procedure: Abdominal Aortogram;  Surgeon: Wellington Hampshire, MD;  Location: [place] [facility];  Service: Cardiovascular;  Laterality: N/A;  . PERIPHERAL VASCULAR CATHETERIZATION Bilateral 11/14/2016   Procedure: Lower Extremity Angiography;  Surgeon: Wellington Hampshire, MD;  Location: [place] [facility];  Service: Cardiovascular;  Laterality: Bilateral;  limited iliacs only  . PERIPHERAL VASCULAR CATHETERIZATION Left 11/14/2016   Procedure: Peripheral Vascular Intervention;  Surgeon: Wellington Hampshire, MD;  Location: [place] [facility];  Service: Cardiovascular;  Laterality: Left;  common iliac  . SKIN CANCER EXCISION    . THYROID SURGERY  2007   secondary to thyroid cancer    FAMILY HISTORY: Family History  Problem Relation Age of Onset  . Alzheimer's disease Father   . Stroke Mother   . Coronary  artery disease Maternal Grandmother   . Heart disease Maternal Grandmother 70  . Coronary artery disease Paternal Grandmother 74  . Heart disease Paternal Grandmother 71  . Breast cancer Neg Hx     ADVANCED DIRECTIVES (Y/N):  N  HEALTH MAINTENANCE: Social History   Tobacco Use  . Smoking status: Current Every Day Smoker    Packs/day: 0.50    Years: 20.00    Pack years: 10.00    Types: Cigarettes    Last attempt to quit: 10/28/2011    Years since quitting: 8.8  . Smokeless tobacco: Never Used  Vaping Use  . Vaping Use: Former  Substance Use Topics  . Alcohol use: Not Currently  . Drug use: No     Colonoscopy:  PAP:  Bone density:  Lipid panel:  Allergies  Allergen Reactions  . Atorvastatin Other (See Comments)    Myalgias   . Darvocet [Propoxyphene N-Acetaminophen] Nausea And Vomiting  . Demerol Nausea And Vomiting  . Methocarbamol Other (See Comments)    UNKNOWN  . Mirtazapine     Sleeplessness, nausea   . Primidone     Severe insomnia, myoclonus and parathesias   . Ambien [Zolpidem Tartrate] Palpitations    Anxiety , trembling.    Current Outpatient Medications  Medication Sig Dispense Refill  . clopidogrel (PLAVIX) 75 MG tablet TAKE 1 TABLET BY MOUTH ONCE DAILY 90 tablet 1  . Colostrum 500 MG CAPS Take 1 capsule by mouth daily.    Marland Kitchen escitalopram (LEXAPRO) 10 MG tablet Take 1 tablet (10 mg total) by mouth daily. 30 tablet 5  . levothyroxine (SYNTHROID) 100 MCG tablet Take 1 tablet (100 mcg total) by mouth daily before breakfast. 60 tablet 3  . Multiple Vitamin (MULTIVITAMIN WITH MINERALS) TABS tablet Take 1 tablet by mouth daily.    . propranolol (INDERAL) 10 MG tablet TAKE 1 TABLET BY MOUTH 3 TIMES DAILY AS NEEDED FOR TREMOR 90 tablet 1  . rosuvastatin (CRESTOR) 20 MG tablet Take 1 tablet (20 mg total) by mouth daily. 90 tablet 3   No current facility-administered medications for this visit.    OBJECTIVE: Vitals:   08/19/20 1037  BP: (!) 138/58   Pulse: 69  Resp: 20  Temp: 98 F (36.7 C)  SpO2: 99%     Body mass index is 16.28 kg/m.    ECOG FS:0 - Asymptomatic  General: Thin, no acute distress. Eyes: Pink conjunctiva, anicteric sclera. HEENT: Normocephalic, moist mucous membranes. Lungs: No audible wheezing or coughing. Heart: Regular rate and rhythm. Abdomen: Soft, nontender, no obvious distention. Musculoskeletal: No edema, cyanosis, or clubbing. Neuro: Alert, answering all questions appropriately. Cranial nerves grossly intact. Skin: No rashes or petechiae noted. Psych: Normal affect.   LAB RESULTS:  Lab Results  Component Value Date   NA 140 07/29/2020   K 3.5 07/29/2020   CL 104 07/29/2020   CO2 28 07/29/2020   GLUCOSE 85 07/29/2020   BUN 9 07/29/2020   CREATININE 0.65 07/29/2020   CALCIUM 9.1 07/29/2020   PROT 6.4 07/29/2020   ALBUMIN 3.9 07/29/2020   AST 17 07/29/2020   ALT 10 07/29/2020   ALKPHOS 91 07/29/2020   BILITOT 0.5 07/29/2020   GFRNONAA >60 02/28/2019  GFRAA >60 02/28/2019    Lab Results  Component Value Date   WBC 14.4 (H) 08/17/2020   NEUTROABS 9.9 (H) 08/17/2020   HGB 16.8 (H) 08/17/2020   HCT 47.4 (H) 08/17/2020   MCV 98.3 08/17/2020   PLT 201 08/17/2020   Lab Results  Component Value Date   IRON 142 08/17/2020   TIBC 367 08/17/2020   IRONPCTSAT 39 (H) 08/17/2020   Lab Results  Component Value Date   FERRITIN 64 08/17/2020     STUDIES: US Abdomen Limited RUQ  Result Date: 08/05/2020 CLINICAL DATA:  Postprandial diarrhea right upper quadrant pain, history of alcohol abuse EXAM: ULTRASOUND ABDOMEN LIMITED RIGHT UPPER QUADRANT COMPARISON:  None. FINDINGS: Gallbladder: Single 5 mm mobile gallstone is identified. No wall thickening visualized. No sonographic Murphy sign noted by sonographer. Common bile duct: Diameter: Measures up to 8 mm Liver: There is a hyperechoic lesion of the left lobe measuring 2.5 x 1.8 x 1.6 cm. Within normal limits in parenchymal echogenicity.  Portal vein is patent on color Doppler imaging with normal direction of blood flow towards the liver. Other: None. IMPRESSION: Single mobile gallstone. No sonographic evidence of acute cholecystitis. Mildly dilated common bile duct. Small hyperechoic lesion of the left hepatic lobe. May reflect an area of focal fat or hemangioma in the absence of known malignancy. Electronically Signed   By: Macy Mis M.D.   On: 08/05/2020 10:40    ASSESSMENT: Hereditary hemochromatosis, compound heterozygote for C282Y and H63D mutations.  PLAN:    1. Hereditary hemochromatosis: Patient's hemoglobin and iron saturation remain mildly elevated, but ferritin remains adequate at 64.  Patient continues to take "colostrum" over-the-counter to control her iron stores.  No intervention is needed at this time.  Patient does not require phlebotomy. Continue laboratory work-up per primary care physician.  Return to clinic in 6 months for routine evaluation.  If patient requires phlebotomy, she would prefer to have a prescription to take to [facility]. 2.  Elevated liver enzymes: Resolved. 3.  Leukocytosis: Chronic and unchanged. 4.  Liver lesion: Indeterminate.  Patient reports her primary care physician is aware and would prefer that she follow-up on any additional imaging if needed.   Patient expressed understanding and was in agreement with this plan. She also understands that She can call clinic at any time with any questions, concerns, or complaints.    Lloyd Huger, MD   08/19/2020 12:27 PM

## 2020-08-16 ENCOUNTER — Other Ambulatory Visit: Payer: Self-pay

## 2020-08-16 ENCOUNTER — Telehealth: Payer: Self-pay | Admitting: *Deleted

## 2020-08-16 NOTE — Telephone Encounter (Addendum)
Patient called reporting that she had been getting her labs for Dr [name] drawn at Dr Demetrios Isaacs office due to insurance, but now her insurance has changed to [facility] part A,b and D and she is asking if it will pay for labs to be drawn at cancer center, if not an order will need to be sent to Dr Demetrios Isaacs office for draw prior to Friday appointment with Dr [name]. I spoke with [place] and she is going to check on this and will let you know.

## 2020-08-17 ENCOUNTER — Inpatient Hospital Stay: Payer: Medicare PPO | Attending: Oncology

## 2020-08-17 ENCOUNTER — Other Ambulatory Visit: Payer: Self-pay

## 2020-08-17 DIAGNOSIS — E785 Hyperlipidemia, unspecified: Secondary | ICD-10-CM | POA: Diagnosis not present

## 2020-08-17 DIAGNOSIS — E039 Hypothyroidism, unspecified: Secondary | ICD-10-CM | POA: Insufficient documentation

## 2020-08-17 DIAGNOSIS — Z8249 Family history of ischemic heart disease and other diseases of the circulatory system: Secondary | ICD-10-CM | POA: Diagnosis not present

## 2020-08-17 DIAGNOSIS — Z85850 Personal history of malignant neoplasm of thyroid: Secondary | ICD-10-CM | POA: Insufficient documentation

## 2020-08-17 DIAGNOSIS — K769 Liver disease, unspecified: Secondary | ICD-10-CM | POA: Insufficient documentation

## 2020-08-17 DIAGNOSIS — Z85828 Personal history of other malignant neoplasm of skin: Secondary | ICD-10-CM | POA: Insufficient documentation

## 2020-08-17 DIAGNOSIS — D72829 Elevated white blood cell count, unspecified: Secondary | ICD-10-CM | POA: Diagnosis not present

## 2020-08-17 DIAGNOSIS — Z79899 Other long term (current) drug therapy: Secondary | ICD-10-CM | POA: Insufficient documentation

## 2020-08-17 DIAGNOSIS — F17210 Nicotine dependence, cigarettes, uncomplicated: Secondary | ICD-10-CM | POA: Insufficient documentation

## 2020-08-17 LAB — CBC WITH DIFFERENTIAL/PLATELET
Abs Immature Granulocytes: 0.08 10*3/uL — ABNORMAL HIGH (ref 0.00–0.07)
Basophils Absolute: 0.1 10*3/uL (ref 0.0–0.1)
Basophils Relative: 1 %
Eosinophils Absolute: 0.2 10*3/uL (ref 0.0–0.5)
Eosinophils Relative: 1 %
HCT: 47.4 % — ABNORMAL HIGH (ref 36.0–46.0)
Hemoglobin: 16.8 g/dL — ABNORMAL HIGH (ref 12.0–15.0)
Immature Granulocytes: 1 %
Lymphocytes Relative: 22 %
Lymphs Abs: 3.2 10*3/uL (ref 0.7–4.0)
MCH: 34.9 pg — ABNORMAL HIGH (ref 26.0–34.0)
MCHC: 35.4 g/dL (ref 30.0–36.0)
MCV: 98.3 fL (ref 80.0–100.0)
Monocytes Absolute: 0.9 10*3/uL (ref 0.1–1.0)
Monocytes Relative: 6 %
Neutro Abs: 9.9 10*3/uL — ABNORMAL HIGH (ref 1.7–7.7)
Neutrophils Relative %: 69 %
Platelets: 201 10*3/uL (ref 150–400)
RBC: 4.82 MIL/uL (ref 3.87–5.11)
RDW: 12.6 % (ref 11.5–15.5)
WBC: 14.4 10*3/uL — ABNORMAL HIGH (ref 4.0–10.5)
nRBC: 0 % (ref 0.0–0.2)

## 2020-08-17 LAB — IRON AND TIBC
Iron: 142 ug/dL (ref 28–170)
Saturation Ratios: 39 % — ABNORMAL HIGH (ref 10.4–31.8)
TIBC: 367 ug/dL (ref 250–450)
UIBC: 225 ug/dL

## 2020-08-17 LAB — FERRITIN: Ferritin: 64 ng/mL (ref 11–307)

## 2020-08-19 ENCOUNTER — Encounter: Payer: Self-pay | Admitting: Oncology

## 2020-08-19 ENCOUNTER — Inpatient Hospital Stay: Payer: Medicare PPO | Admitting: Oncology

## 2020-08-19 ENCOUNTER — Other Ambulatory Visit: Payer: Self-pay

## 2020-08-19 NOTE — Progress Notes (Signed)
Patient here today for follow up. Denies any pain. States she does have a gallstone and planning to follow up with pcp regarding next steps regarding treatment. Denies other concerns at this time.

## 2020-08-22 ENCOUNTER — Other Ambulatory Visit: Payer: Self-pay | Admitting: Internal Medicine

## 2020-08-25 ENCOUNTER — Ambulatory Visit (INDEPENDENT_AMBULATORY_CARE_PROVIDER_SITE_OTHER): Payer: Medicare PPO | Admitting: Internal Medicine

## 2020-08-25 ENCOUNTER — Telehealth: Payer: Self-pay | Admitting: Internal Medicine

## 2020-08-25 ENCOUNTER — Other Ambulatory Visit: Payer: Self-pay

## 2020-08-25 ENCOUNTER — Encounter: Payer: Self-pay | Admitting: Internal Medicine

## 2020-08-25 VITALS — BP 120/66 | HR 84 | Temp 98.4°F | Resp 15 | Ht 68.0 in | Wt 108.6 lb

## 2020-08-25 DIAGNOSIS — Z780 Asymptomatic menopausal state: Secondary | ICD-10-CM

## 2020-08-25 DIAGNOSIS — K838 Other specified diseases of biliary tract: Secondary | ICD-10-CM

## 2020-08-25 DIAGNOSIS — N6310 Unspecified lump in the right breast, unspecified quadrant: Secondary | ICD-10-CM

## 2020-08-25 DIAGNOSIS — R197 Diarrhea, unspecified: Secondary | ICD-10-CM

## 2020-08-25 DIAGNOSIS — K8020 Calculus of gallbladder without cholecystitis without obstruction: Secondary | ICD-10-CM | POA: Diagnosis not present

## 2020-08-25 NOTE — Patient Instructions (Signed)
GI referral for colonoscopy is recommended and in progress because of the persistent diarrhea   Diagnostic mammogram ordered   HIDA scan ordered to evaluate the gallbladder's function

## 2020-08-25 NOTE — Telephone Encounter (Signed)
err

## 2020-08-25 NOTE — Progress Notes (Signed)
Subjective:  Patient ID: Tracie Morris, female    DOB: [birth year]  Age: 64 y.o. MRN: [id number]  CC: The primary encounter diagnosis was Diarrhea in adult patient. Diagnoses of Mass of breast, right, Postmenopausal estrogen deficiency, Calculus of gallbladder without cholecystitis without obstruction, Diarrhea, unspecified type, and Dilated cbd, acquired were also pertinent to this visit.  HPI Tracie Morris presents for  Follow up on multiple issues..   This visit occurred during the SARS-CoV-2 public health emergency.  Safety protocols were in place, including screening questions prior to the visit, additional usage of staff PPE, and extensive cleaning of exam room while observing appropriate contact time as indicated for disinfecting solutions.   1) abnormal breast exam  2) gallstone, liver lesion seen on RUQ [place] 3) malnutrition , severe, secondary to alcohol abuse , now recovering   1) RUQ Pain;  Sent for [place].  Reviewed with patient today.  She Reports an improvement in her symptoms,  Occurring infrequently. Marland Kitchen   HIDA scan recommended   2) Chronic Diarrhea:  Previous workup for infectious causes and pancreatic insufficiency negative in early September. History of fecal urgency would occur during eating. before the meal was even complete.  Imodium has helped her "pattern" but only when taken regularly .  Has not had a colonoscopy in over ten years. Marland Kitchen   3) Alcohol abuse: remains abstinent since her detox and rehab in [facility],  [place] last April  2020/ .    4) Malnutrition :  Has gained 10 lbs since her rehab for alcohol,  then but unable to gain more today.  Appetite good   5) Mammogram declined by rad tech due to patient report of right breast lump reported by patient   Outpatient Medications Prior to Visit  Medication Sig Dispense Refill  . clopidogrel (PLAVIX) 75 MG tablet TAKE 1 TABLET BY MOUTH ONCE DAILY 90 tablet 1  . Colostrum 500 MG CAPS Take 1 capsule by mouth  daily.    Marland Kitchen escitalopram (LEXAPRO) 10 MG tablet Take 1 tablet (10 mg total) by mouth daily. 30 tablet 5  . levothyroxine (SYNTHROID) 100 MCG tablet Take 1 tablet (100 mcg total) by mouth daily before breakfast. 60 tablet 3  . Multiple Vitamin (MULTIVITAMIN WITH MINERALS) TABS tablet Take 1 tablet by mouth daily.    . propranolol (INDERAL) 10 MG tablet TAKE 1 TABLET BY MOUTH 3 TIMES DAILY AS NEEDED FOR TREMOR 90 tablet 1  . rosuvastatin (CRESTOR) 20 MG tablet Take 1 tablet (20 mg total) by mouth daily. 90 tablet 3   No facility-administered medications prior to visit.    Review of Systems;  Patient denies headache, fevers, malaise, unintentional weight loss, skin rash, eye pain, sinus congestion and sinus pain, sore throat, dysphagia,  hemoptysis , cough, dyspnea, wheezing, chest pain, palpitations, orthopnea, edema, abdominal pain, nausea, melena, diarrhea, constipation, flank pain, dysuria, hematuria, urinary  Frequency, nocturia, numbness, tingling, seizures,  Focal weakness, Loss of consciousness,  Tremor, insomnia, depression, anxiety, and suicidal ideation.      Objective:  BP 120/66 (BP Location: Left Arm, Patient Position: Sitting, Cuff Size: Normal)   Pulse 84   Temp 98.4 F (36.9 C) (Oral)   Resp 15   Ht 5\' 8"  (1.727 m)   Wt 108 lb 9.6 oz (49.3 kg)   SpO2 98%   BMI 16.51 kg/m   BP Readings from Last 3 Encounters:  08/25/20 120/66  08/19/20 (!) 138/58  07/29/20 128/78    Wt  Readings from Last 3 Encounters:  08/25/20 108 lb 9.6 oz (49.3 kg)  08/19/20 107 lb 1.6 oz (48.6 kg)  07/29/20 109 lb (49.4 kg)    General appearance: alert, cooperative and appears stated age Ears: normal TM's and external ear canals both ears Throat: lips, mucosa, and tongue normal; teeth and gums normal Neck: no adenopathy, no carotid bruit, supple, symmetrical, trachea midline and thyroid not enlarged, symmetric, no tenderness/mass/nodules Back: symmetric, no curvature. ROM normal. No CVA  tenderness. Lungs: clear to auscultation bilaterally Heart: regular rate and rhythm, S1, S2 normal, no murmur, click, rub or gallop Abdomen: soft, non-tender; bowel sounds normal; no masses,  no organomegaly Pulses: 2+ and symmetric Skin: Skin color, texture, turgor normal. No rashes or lesions Lymph nodes: Cervical, supraclavicular, and axillary nodes normal.  Lab Results  Component Value Date   HGBA1C 4.2 (L) 12/22/2018    Lab Results  Component Value Date   CREATININE 0.65 07/29/2020   CREATININE 0.67 02/08/2020   CREATININE 0.64 02/28/2019    Lab Results  Component Value Date   WBC 14.4 (H) 08/17/2020   HGB 16.8 (H) 08/17/2020   HCT 47.4 (H) 08/17/2020   PLT 201 08/17/2020   GLUCOSE 85 07/29/2020   CHOL 196 02/29/2020   TRIG 96 02/29/2020   HDL 46 02/29/2020   LDLCALC 133 (H) 02/29/2020   ALT 10 07/29/2020   AST 17 07/29/2020   NA 140 07/29/2020   K 3.5 07/29/2020   CL 104 07/29/2020   CREATININE 0.65 07/29/2020   BUN 9 07/29/2020   CO2 28 07/29/2020   TSH 0.03 (L) 05/20/2020   INR 1.0 11/05/2016   HGBA1C 4.2 (L) 12/22/2018   MICROALBUR 0.6 01/11/2014    US Abdomen Limited RUQ  Result Date: 08/05/2020 CLINICAL DATA:  Postprandial diarrhea right upper quadrant pain, history of alcohol abuse EXAM: ULTRASOUND ABDOMEN LIMITED RIGHT UPPER QUADRANT COMPARISON:  None. FINDINGS: Gallbladder: Single 5 mm mobile gallstone is identified. No wall thickening visualized. No sonographic Murphy sign noted by sonographer. Common bile duct: Diameter: Measures up to 8 mm Liver: There is a hyperechoic lesion of the left lobe measuring 2.5 x 1.8 x 1.6 cm. Within normal limits in parenchymal echogenicity. Portal vein is patent on color Doppler imaging with normal direction of blood flow towards the liver. Other: None. IMPRESSION: Single mobile gallstone. No sonographic evidence of acute cholecystitis. Mildly dilated common bile duct. Small hyperechoic lesion of the left hepatic lobe. May  reflect an area of focal fat or hemangioma in the absence of known malignancy. Electronically Signed   By: Macy Mis M.D.   On: 08/05/2020 10:40    Assessment & Plan:   Problem List Items Addressed This Visit      Unprioritized   Diarrhea    Persistent,  With infectious etiologies unlikely given negative screenings.  Pancreatic insufficiency less likely given normal elastase.  Referring to GI for colonoscopy      Relevant Orders   Ambulatory referral to Gastroenterology   Dilated cbd, acquired    With episodes of RUQ pain and solitary gallstone on US>  HIDA scan ordered       Mass of breast, right    Diagnostic mammogram ordered.       Relevant Orders   MM DIAG BREAST TOMO BILATERAL   US BREAST COMPLETE UNI RIGHT INC AXILLA    Other Visit Diagnoses    Diarrhea in adult patient    -  Primary   Relevant Orders  Ambulatory referral to Gastroenterology   Postmenopausal estrogen deficiency       Relevant Orders   DG Bone Density   Calculus of gallbladder without cholecystitis without obstruction       Relevant Orders   NM Hepato W/Eject Fract     More than  40 minutes was spent with patient more than half of which was spent in counseling patient on the above mentioned issues , reviewing and explaining recent labs and imaging studies done, and coordination of care.   I am having Len Blalock maintain her multivitamin with minerals, clopidogrel, Colostrum, rosuvastatin, propranolol, levothyroxine, and escitalopram.  No orders of the defined types were placed in this encounter.   There are no discontinued medications.  Follow-up: No follow-ups on file.   [name], MD

## 2020-08-28 DIAGNOSIS — N6310 Unspecified lump in the right breast, unspecified quadrant: Secondary | ICD-10-CM | POA: Insufficient documentation

## 2020-08-28 DIAGNOSIS — K838 Other specified diseases of biliary tract: Secondary | ICD-10-CM | POA: Insufficient documentation

## 2020-08-28 HISTORY — DX: Unspecified lump in the right breast, unspecified quadrant: N63.10

## 2020-08-28 NOTE — Assessment & Plan Note (Signed)
With episodes of RUQ pain and solitary gallstone on US>  HIDA scan ordered

## 2020-08-28 NOTE — Assessment & Plan Note (Signed)
Diagnostic mammogram ordered.  

## 2020-08-28 NOTE — Assessment & Plan Note (Signed)
Persistent,  With infectious etiologies unlikely given negative screenings.  Pancreatic insufficiency less likely given normal elastase.  Referring to GI for colonoscopy

## 2020-08-29 ENCOUNTER — Telehealth: Payer: Self-pay

## 2020-08-29 ENCOUNTER — Telehealth: Payer: Self-pay | Admitting: Internal Medicine

## 2020-08-29 DIAGNOSIS — N6310 Unspecified lump in the right breast, unspecified quadrant: Secondary | ICD-10-CM

## 2020-08-29 NOTE — Telephone Encounter (Signed)
-----   Message from Ashley Jacobs sent at 08/26/2020 10:02 AM EDT ----- Regarding: [place] for mammo Good morning!  [place] order that's dated for 08/25/2020 it incorrect. Needing IMG [id number]. Please advise and Thank you!  Diagnostic is correct.

## 2020-08-29 NOTE — Telephone Encounter (Signed)
lft msg on vm about appt time date and location.

## 2020-08-29 NOTE — Telephone Encounter (Signed)
Order has been corrected.  

## 2020-08-29 NOTE — Telephone Encounter (Signed)
Thank you pt has been scheduled

## 2020-08-30 ENCOUNTER — Ambulatory Visit: Payer: Medicare PPO | Admitting: Gastroenterology

## 2020-08-30 ENCOUNTER — Encounter: Payer: Self-pay | Admitting: Gastroenterology

## 2020-08-30 ENCOUNTER — Other Ambulatory Visit: Payer: Self-pay

## 2020-08-30 ENCOUNTER — Telehealth: Payer: Self-pay

## 2020-08-30 ENCOUNTER — Telehealth: Payer: Self-pay | Admitting: Cardiovascular Disease

## 2020-08-30 DIAGNOSIS — R197 Diarrhea, unspecified: Secondary | ICD-10-CM | POA: Diagnosis not present

## 2020-08-30 MED ORDER — NA SULFATE-K SULFATE-MG SULF 17.5-3.13-1.6 GM/177ML PO SOLN: 354.0000 mL | Freq: Once | ORAL | 0 refills | Status: AC

## 2020-08-30 NOTE — Telephone Encounter (Signed)
   [facility] Pre-operative Risk Assessment    HEARTCARE STAFF: - Please ensure there is not already an duplicate clearance open for this procedure. - Under Visit Info/Reason for Call, type in Other and utilize the format Clearance MM/DD/YY or Clearance TBD. Do not use dashes or single digits. - If request is for dental extraction, please clarify the # of teeth to be extracted.  Request for surgical clearance:  1. What type of surgery is being performed? colonoscopy  2. When is this surgery scheduled? 09-22-20   3. What type of clearance is required (medical clearance vs. Pharmacy clearance to hold med vs. Both)? both  4. Are there any medications that need to be held prior to surgery and how long? Please advise  5. Practice name and name of physician performing surgery?  [facility]   6. What is the office phone number? [phone]   7.   What is the office fax number? [phone]  8.   Anesthesia type (None, local, MAC, general) ?  Not noted    Clarisse Gouge 08/30/2020, 4:29 PM  _________________________________________________________________   (provider comments below)

## 2020-08-30 NOTE — Telephone Encounter (Signed)
Duplicate request. Awaiting Dr. Fletcher Anon.

## 2020-08-30 NOTE — Telephone Encounter (Signed)
    [facility] Pre-operative Risk Assessment    Request for surgical clearance:  1. What type of surgery is being performed? Colonoscopy   2. When is this surgery scheduled? 09/22/2020   3. Are there any medications that need to be held prior to surgery and how long? Plavix 53m    4. Practice name and name of physician performing surgery? [facility]   5. What is your office phone and fax number? [phone]  6. Anesthesia type (None, local, MAC, general) ? General    AShelby Mattocks10/03/2020, 3:10 PM  _________________________________________________________________   (provider comments below)

## 2020-08-30 NOTE — Telephone Encounter (Signed)
Dr. Fletcher Anon, Pt has significant PAD s/p left common iliac stent 09/2013 and covered stent in 2017. We are being asked for clearance and to hold plavix for 5-7 days for colonoscopy. Do you agree she may hold plavix?

## 2020-08-30 NOTE — Telephone Encounter (Signed)
Yes can hold Plavix 5 days before.

## 2020-08-30 NOTE — Progress Notes (Signed)
Experiencing  Cephas Darby, MD [address]  [place]  [place], [place] [address]  Main: [phone]  Fax: [phone]    Gastroenterology Consultation  Referring Provider:     [name], MD Primary Care Physician:  [name], MD Primary Gastroenterologist:  Dr. Cephas Darby Reason for Consultation:     Chronic diarrhea        HPI:   Tracie Morris is a 64 y.o. female referred by Dr. [name], MD  for consultation & management of chronic diarrhea. Patient started experiencing watery bowel movements, about 5-6 times per day, predominantly postprandial with abdominal cramps, started in August. She saw Dr. Derrel Nip in first week of September, underwent stool studies to rule out infection, pancreatic fecal elastase levels were normal. She has been taking Imodium as needed since then. Currently, her bowel movements are down to 2 to 3/day. Patient has history of alcohol intoxication, underwent detox in [place]. Reports having 1 to 2 glasses of wine per day only. She does smoke cigarettes, 40+ years of heavy smoking history. Patient had elevated LFTs, obstructive pattern and also consistent with alcohol use in 2020, right upper quadrant ultrasound revealed mildly dilated CBD and a mobile stone in gallbladder. Her LFTs have been normal since 01/2020.  Patient's BMI is low, she does report mild intermittent right upper quadrant and left upper quadrant discomfort. She denies abdominal bloating. She denies fever, chills, nausea or vomiting. She denies rectal bleeding.  NSAIDs: None  Antiplts/Anticoagulants/Anti thrombotics: Plavix for history of peripheral vascular disease  GI Procedures: None She denies family history of GI malignancy  Past Medical History:  Diagnosis Date  . Basal cell carcinoma 03/29/2014   L lower abdomen  . Basal cell carcinoma 01/18/2020   central nasal tip  . Carotid artery occlusion   . Hyperlipidemia   . Hypocalcemia   .  hypothyroidism 2007   acquired, secondary to surgery for thyroid Ca  . Migraine    improved since thyroid surgery, 2 years  . multinodular goiter 2007   s/p thyroidectomy  . 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 Repeat angiography in November of 2014 showed 50% distal aortic stenosis extending into the left common iliac artery which had 60-70% ostial stenosis. No other significant disease. I placed on balloon expandable stent into  left common iliac artery extending slightly into the distal aorta  . 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 ABDOMINAL AORTAGRAM N/A 10/14/2013   Procedure: ABDOMINAL [name];  Surgeon: Wellington Hampshire, MD;  Location: [facility];  Service: Cardiovascular;  Laterality: N/A;  . ANGIOPLASTY ILLIAC ARTERY    . CESAREAN SECTION    . NOSE SURGERY    . PERIPHERAL VASCULAR CATHETERIZATION N/A 11/14/2016   Procedure: Abdominal Aortogram;  Surgeon: Wellington Hampshire, MD;  Location: [facility];  Service: Cardiovascular;  Laterality: N/A;  . PERIPHERAL VASCULAR CATHETERIZATION Bilateral 11/14/2016   Procedure: Lower Extremity Angiography;  Surgeon: Wellington Hampshire, MD;  Location: [facility];  Service: Cardiovascular;  Laterality: Bilateral;  limited iliacs only  . PERIPHERAL VASCULAR CATHETERIZATION Left 11/14/2016   Procedure: Peripheral Vascular Intervention;  Surgeon: Wellington Hampshire, MD;  Location: [facility];  Service: Cardiovascular;  Laterality: Left;  common iliac  . SKIN  CANCER EXCISION    . THYROID SURGERY  2007   secondary to thyroid cancer    Current Outpatient Medications:  .  clopidogrel (PLAVIX) 75 MG tablet, TAKE 1 TABLET BY MOUTH ONCE DAILY, Disp: 90 tablet, Rfl: 1 .  Colostrum 500 MG CAPS, Take 1 capsule by mouth daily., Disp: , Rfl:    .  escitalopram (LEXAPRO) 10 MG tablet, Take 1 tablet (10 mg total) by mouth daily., Disp: 30 tablet, Rfl: 5 .  levothyroxine (SYNTHROID) 100 MCG tablet, Take 1 tablet (100 mcg total) by mouth daily before breakfast., Disp: 60 tablet, Rfl: 3 .  Multiple Vitamin (MULTIVITAMIN WITH MINERALS) TABS tablet, Take 1 tablet by mouth daily., Disp: , Rfl:  .  propranolol (INDERAL) 10 MG tablet, TAKE 1 TABLET BY MOUTH 3 TIMES DAILY AS NEEDED FOR TREMOR, Disp: 90 tablet, Rfl: 1 .  rosuvastatin (CRESTOR) 20 MG tablet, Take 1 tablet (20 mg total) by mouth daily., Disp: 90 tablet, Rfl: 3 .  Na Sulfate-K Sulfate-Mg Sulf 17.5-3.13-1.6 GM/177ML SOLN, Take 354 mLs by mouth once for 1 dose., Disp: 354 mL, Rfl: 0   Family History  Problem Relation Age of Onset  . Alzheimer's disease Father   . Stroke Mother   . Coronary artery disease Maternal Grandmother   . Heart disease Maternal Grandmother 70  . Coronary artery disease Paternal Grandmother 78  . Heart disease Paternal Grandmother 61  . Breast cancer Neg Hx      Social History   Tobacco Use  . Smoking status: Current Every Day Smoker    Packs/day: 0.50    Years: 20.00    Pack years: 10.00    Types: Cigarettes    Last attempt to quit: 10/28/2011    Years since quitting: 8.8  . Smokeless tobacco: Never Used  Vaping Use  . Vaping Use: Former  Substance Use Topics  . Alcohol use: Not Currently  . Drug use: No    Allergies as of 08/30/2020 - Review Complete 08/30/2020  Allergen Reaction Noted  . Atorvastatin Other (See Comments) 01/31/2015  . Darvocet [propoxyphene n-acetaminophen] Nausea And Vomiting 10/19/2011  . Demerol Nausea And Vomiting 10/19/2011  . Methocarbamol Other (See Comments) 01/31/2015  . Mirtazapine  04/09/2016  . Primidone  08/18/2018  . Ambien [zolpidem tartrate] Palpitations 01/31/2015    Review of Systems:    All systems reviewed and negative except where noted in HPI.   Physical Exam:  BP 111/64 (BP Location: Left  Arm, Patient Position: Sitting, Cuff Size: Normal)   Pulse 85   Temp 98.5 F (36.9 C) (Oral)   Ht 5\' 8"  (1.727 m)   Wt 107 lb 6 oz (48.7 kg)   BMI 16.33 kg/m  No LMP recorded. Patient is postmenopausal.  General:   Alert, thin built, moderately nourished, pleasant and cooperative in NAD Head:  Normocephalic and atraumatic. Eyes:  Sclera clear, no icterus.   Conjunctiva pink. Ears:  Normal auditory acuity. Nose:  No deformity, discharge, or lesions. Mouth:  No deformity or lesions,oropharynx pink & moist. Neck:  Supple; no masses or thyromegaly. Lungs:  Respirations even and unlabored.  Clear throughout to auscultation.   No wheezes, crackles, or rhonchi. No acute distress. Heart:  Regular rate and rhythm; no murmurs, clicks, rubs, or gallops. Abdomen:  Normal bowel sounds. Soft, non-tender and non-distended without masses, hepatosplenomegaly or hernias noted.  No guarding or rebound tenderness.   Rectal: Not performed Msk:  Symmetrical without gross deformities. Good, equal movement & strength bilaterally. Pulses:  Normal pulses noted. Extremities:  No clubbing or edema.  No cyanosis. Neurologic:  Alert and oriented x3;  grossly normal neurologically. Skin:  Intact without significant lesions or rashes. No jaundice. Lymph Nodes:  No significant cervical adenopathy. Psych:  Alert and cooperative. Normal mood and affect.  Imaging Studies: Reviewed  Assessment and Plan:   Tracie Morris is a 64 y.o. Caucasian female with history of hereditary hemochromatosis, therapeutic phlebotomy as needed, no evidence of chronic liver disease, chronic tobacco use, exalcohol abuse status post detox is seen in consultation for 2 months history of nonbloody diarrhea  Chronic diarrhea Stool studies negative for infection Pancreatic fecal elastase levels normal Recommend colonoscopy with TI evaluation and biopsies. Patient has to be off Plavix for 5 days before undergoing colonoscopy Okay to  continue Imodium for now Trial of Zenpep, samples provided If above work-up is negative, will evaluate for celiac disease   Dilated CBD, history of cholelithiasis and given history of heavy tobacco use LFTs have been normal within last 6 months Recommend CT pancreas protocol Continue to remain abstinent from heavy alcohol use   Follow up in 2 months   Cephas Darby, MD

## 2020-08-30 NOTE — Patient Instructions (Addendum)
Please take the Zenpep take 2 capsule with the first bite of each meal and 1 capsule with the first bite of each snack   Your CT scan is scheduled for 09/20/2020 at 9:00am arrived at 8:45am. Nothing to eat or drink after midnight

## 2020-08-30 NOTE — Progress Notes (Signed)
Cardiology Office Note    Date:  09/02/2020   ID:  Tracie Morris, DOB Aug 13, 1956, MRN [id number]  PCP:  [name], MD  Cardiologist:  [name], MD  Electrophysiologist:  None   Chief Complaint: Follow-up  History of Present Illness:   Tracie Morris is a 63 y.o. female with history of PAD s/p left common iliac artery stent in 09/2013 with subsequent covered stent placement in 2017 for in situ thrombosis with subtotal occlusion, hereditary hemochromatosis, HLD, and tobacco use who presents for follow-up of PAD.   Prior CT scan has demonstrated coronary artery calcification. She was seen by Dr. Fletcher Anon in 06/2019 for follow up with noted significant weight loss previously attributed to excessive alcohol use with poor po intake. She attended rehab in [place] and was successful in quitting alcohol. With some weight gain, she was feeling better. She denied any lower extremity claudication. She continued to smoke. She has been evaluated by endocrinology for an incidentally noted adrenal mass with workup being unrevealing. Most recent noninvasive lower extremity imaging from 02/22/2020 showed normal ABIs and a patent iliac stent as outlined below.   She was last seen in the office in 02/2020 and doing well from a cardiac perspective.  She denied any lifestyle limiting claudication symptoms.  She was interested in retrying Chantix for smoking cessation.  Carotid artery ultrasound in 03/2020 showed 1 to 39% bilateral ICA stenosis with antegrade flow of the bilateral vertebral arteries and normal flow hemodynamics in the bilateral subclavian arteries.  More recently, she has been evaluated by GI for ongoing diarrhea.  She is scheduled for a colonoscopy on 09/22/2020.  Her primary cardiologist has reviewed her case and authorized Plavix to be held for 5 days prior to procedure.  She comes in doing well from a cardiac perspective.  She denies any chest pain, dyspnea (outside of wearing a mask  in the summer), palpitations, dizziness, presyncope, or syncope.  No falls, hematochezia, or melena.  Blood pressure is mildly elevated today though she is under increased stress at home with her mother's, and particularly her husband's health.  No lifestyle limiting claudication.  Revised Cardiac Index: Low risk for noncardiac procedure [facility] Activity Status Index: Greater than 4 METs without cardiac limitation   Labs independently reviewed: 08/2020 - BUN 6, serum creatinine 0.62 07/2020 - Hgb 16.8, PLT 201, potassium 3.5, AST/ALT normal, albumin 3.9 04/2020 - TSH 0.03, free T4 1.3 02/2020 - TC 196, TG 96, HDL 46, LDL 133  Past Medical History:  Diagnosis Date  . Basal cell carcinoma 03/29/2014   L lower abdomen  . Basal cell carcinoma 01/18/2020   central nasal tip  . Carotid artery occlusion   . Hyperlipidemia   . Hypocalcemia   . hypothyroidism 2007   acquired, secondary to surgery for thyroid Ca  . Migraine    improved since thyroid surgery, 2 years  . multinodular goiter 2007   s/p thyroidectomy  . 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 Repeat angiography in November of 2014 showed 50% distal aortic stenosis extending into the left common iliac artery which had 60-70% ostial stenosis. No other significant disease. I placed on balloon expandable stent into  left common iliac artery extending slightly into the distal aorta  . 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  ABDOMINAL AORTAGRAM N/A 10/14/2013   Procedure: ABDOMINAL [name];  Surgeon: Wellington Hampshire, MD;  Location: [facility];  Service: Cardiovascular;  Laterality: N/A;  . ANGIOPLASTY ILLIAC ARTERY    . CESAREAN SECTION    . NOSE SURGERY    . PERIPHERAL VASCULAR CATHETERIZATION N/A 11/14/2016   Procedure: Abdominal Aortogram;   Surgeon: Wellington Hampshire, MD;  Location: [facility];  Service: Cardiovascular;  Laterality: N/A;  . PERIPHERAL VASCULAR CATHETERIZATION Bilateral 11/14/2016   Procedure: Lower Extremity Angiography;  Surgeon: Wellington Hampshire, MD;  Location: [facility];  Service: Cardiovascular;  Laterality: Bilateral;  limited iliacs only  . PERIPHERAL VASCULAR CATHETERIZATION Left 11/14/2016   Procedure: Peripheral Vascular Intervention;  Surgeon: Wellington Hampshire, MD;  Location: [facility];  Service: Cardiovascular;  Laterality: Left;  common iliac  . SKIN CANCER EXCISION    . THYROID SURGERY  2007   secondary to thyroid cancer    Current Medications: Current Meds  Medication Sig  . clopidogrel (PLAVIX) 75 MG tablet TAKE 1 TABLET BY MOUTH ONCE DAILY  . Colostrum 500 MG CAPS Take 1 capsule by mouth daily.  Marland Kitchen escitalopram (LEXAPRO) 10 MG tablet Take 1 tablet (10 mg total) by mouth daily.  Marland Kitchen levothyroxine (SYNTHROID) 100 MCG tablet Take 1 tablet (100 mcg total) by mouth daily before breakfast.  . Multiple Vitamin (MULTIVITAMIN WITH MINERALS) TABS tablet Take 1 tablet by mouth daily.  . propranolol (INDERAL) 10 MG tablet TAKE 1 TABLET BY MOUTH 3 TIMES DAILY AS NEEDED FOR TREMOR  . rosuvastatin (CRESTOR) 20 MG tablet Take 1 tablet (20 mg total) by mouth daily.    Allergies:   Atorvastatin, Darvocet [propoxyphene n-acetaminophen], Demerol, Methocarbamol, Mirtazapine, Primidone, and Ambien [zolpidem tartrate]   Social History   Socioeconomic History  . Marital status: Married    Spouse name: Not on file  . Number of children: Not on file  . Years of education: Not on file  . Highest education level: Not on file  Occupational History  . Not on file  Tobacco Use  . Smoking status: Current Every Day Smoker    Packs/day: 0.50    Years: 20.00    Pack years: 10.00    Types: Cigarettes    Last attempt to quit: 10/28/2011    Years since quitting: 8.8  . Smokeless tobacco: Never  Used  Vaping Use  . Vaping Use: Former  Substance and Sexual Activity  . Alcohol use: Not Currently  . Drug use: No  . Sexual activity: Not on file  Other Topics Concern  . Not on file  Social History Narrative  . Not on file   Social Determinants of Health   Financial Resource Strain:   . Difficulty of Paying Living Expenses: Not on file  Food Insecurity:   . Worried About Charity fundraiser in the Last Year: Not on file  . Ran Out of Food in the Last Year: Not on file  Transportation Needs:   . Lack of Transportation (Medical): Not on file  . Lack of Transportation (Non-Medical): Not on file  Physical Activity:   . Days of Exercise per Week: Not on file  . Minutes of Exercise per Session: Not on file  Stress:   . Feeling of Stress : Not on file  Social Connections:   . Frequency of Communication with Friends and Family: Not on file  . Frequency of Social Gatherings with Friends and Family: Not on file  .  Attends Religious Services: Not on file  . Active Member of Clubs or Organizations: Not on file  . Attends Archivist Meetings: Not on file  . Marital Status: Not on file     Family History:  The patient's family history includes Alzheimer's disease in her father; Coronary artery disease in her maternal grandmother; Coronary artery disease (age of onset: 67) in her paternal grandmother; Heart disease (age of onset: 67) in her maternal grandmother; Heart disease (age of onset: 49) in her paternal grandmother; Stroke in her mother. There is no history of Breast cancer.  ROS:   Review of Systems  Constitutional: Positive for malaise/fatigue. Negative for chills, diaphoresis, fever and weight loss.  HENT: Negative for congestion.   Eyes: Negative for discharge and redness.  Respiratory: Negative for cough, sputum production, shortness of breath and wheezing.   Cardiovascular: Negative for chest pain, palpitations, orthopnea, claudication, leg swelling and PND.    Gastrointestinal: Positive for diarrhea. Negative for abdominal pain, blood in stool, constipation, heartburn, melena, nausea and vomiting.       Diarrhea improving on Imodium  Musculoskeletal: Negative for falls and myalgias.  Skin: Negative for rash.  Neurological: Negative for dizziness, tingling, tremors, sensory change, speech change, focal weakness, loss of consciousness and weakness.  Endo/Heme/Allergies: Does not bruise/bleed easily.  Psychiatric/Behavioral: Negative for substance abuse. The patient is not nervous/anxious.   All other systems reviewed and are negative.    EKGs/Labs/Other Studies Reviewed:    Studies reviewed were summarized above. The additional studies were reviewed today:  Bilateral ABIs 02/22/2020: Bilateral ABIs appear essentially unchanged. Left TBIs appear increased.    Summary:  Right: Resting right ankle-brachial index is within normal range. No  evidence of significant right lower extremity arterial disease. The right  toe-brachial index is normal.   Left: Resting left ankle-brachial index is within normal range. No  evidence of significant left lower extremity arterial disease. The left  toe-brachial index is abnormal.  __________  Aorta/IVC/Iliac 02/22/2020: Summary:  Abdominal Aorta: No evidence of an abdominal aortic aneurysm was  visualized.  Stenosis: +-----------------+-----------+  Location     Stent     +-----------------+-----------+  Left Common Iliacno stenosis  +-----------------+-----------+   Heavy wall calcifications throughout aorta and iliac arteries.   IVC/Iliac: There is no evidence of thrombus involving the IVC.    *See table(s) above for measurements and observations.  Suggest follow up study in 12 months.    EKG:  EKG is ordered today.  The EKG ordered today demonstrates NSR, 73 bpm, no acute ST-T changes  Recent Labs: 05/20/2020: TSH 0.03 07/29/2020: ALT 10; Potassium 3.5; Sodium 140 08/17/2020:  Hemoglobin 16.8; Platelets 201 08/30/2020: BUN 6; Creatinine, Ser 0.62  Recent Lipid Panel    Component Value Date/Time   CHOL 196 02/29/2020 1206   TRIG 96 02/29/2020 1206   HDL 46 02/29/2020 1206   CHOLHDL 4.3 02/29/2020 1206   CHOLHDL 3 02/23/2019 0923   VLDL 29.6 02/23/2019 0923   LDLCALC 133 (H) 02/29/2020 1206    PHYSICAL EXAM:    VS:  BP 140/84 (BP Location: Left Arm, Patient Position: Sitting, Cuff Size: Normal)   Pulse 73   Ht 5' 7.5" (1.715 m)   Wt 107 lb (48.5 kg)   SpO2 98%   BMI 16.51 kg/m   BMI: Body mass index is 16.51 kg/m.  Physical Exam Constitutional:      Appearance: She is well-developed.  HENT:     Head: Normocephalic and atraumatic.  Eyes:     General:        Right eye: No discharge.        Left eye: No discharge.  Neck:     Vascular: No JVD.  Cardiovascular:     Rate and Rhythm: Normal rate and regular rhythm.     Pulses: No midsystolic click and no opening snap.          Posterior tibial pulses are 1+ on the right side and 2+ on the left side.     Heart sounds: Normal heart sounds, S1 normal and S2 normal. Heart sounds not distant. No murmur heard.  No friction rub.  Pulmonary:     Effort: Pulmonary effort is normal. No respiratory distress.     Breath sounds: Normal breath sounds. No decreased breath sounds, wheezing or rales.  Chest:     Chest wall: No tenderness.  Abdominal:     General: There is no distension.     Palpations: Abdomen is soft.     Tenderness: There is no abdominal tenderness.  Musculoskeletal:     Cervical back: Normal range of motion.  Skin:    General: Skin is warm and dry.     Nails: There is no clubbing.  Neurological:     Mental Status: She is alert and oriented to person, place, and time.  Psychiatric:        Speech: Speech normal.        Behavior: Behavior normal.        Thought Content: Thought content normal.        Judgment: Judgment normal.     Wt Readings from Last 3 Encounters:  09/02/20 107 lb  (48.5 kg)  08/30/20 107 lb 6 oz (48.7 kg)  08/25/20 108 lb 9.6 oz (49.3 kg)     ASSESSMENT & PLAN:   1. Pre-procedure cardiac risk stratification: She is scheduled for a diagnostic colonoscopy on 09/22/2020.  Her primary cardiologist has reviewed her chart and authorized the patient to hold clopidogrel for 5 days prior to her procedure.  Recommend resumption of clopidogrel as soon as safely possible per GI.  Per [facility] she is low risk for noncardiac procedure.  Per [facility] activity status index she can achieve greater than 4 METs without cardiac limitation.  No further cardiac testing and she may proceed with her colonoscopy without any further cardiac testing.  2. PAD: No lifestyle limiting claudication.  Most recent noninvasive imaging from 01/2020 demonstrated normal ABIs bilaterally with patent iliac stent.  Continue with clopidogrel outside of the above brief interruption in therapy for her colonoscopy.  No symptoms concerning for bleeding.  Recent CBC demonstrated stable hemoglobin.  Complete smoking cessation is recommended.  3. HLD: LDL 46 from 02/2020 with normal LFT in 07/2020.  She remains on atorvastatin.  4. Coronary artery calcification involving the native coronary arteries without angina: No symptoms concerning for angina.  She remains on Plavix as well as Crestor as outlined above.  Continue aggressive treatment of risk factors.  No indication for ischemic testing at this time.   Disposition: F/u with Dr. Fletcher Anon or an APP in 6 months following her lower extremity imaging.   Medication Adjustments/Labs and Tests Ordered: Current medicines are reviewed at length with the patient today.  Concerns regarding medicines are outlined above. Medication changes, Labs and Tests ordered today are summarized above and listed in the Patient Instructions accessible in Encounters.   SignedChristell Faith, PA-C 09/02/2020 10:43 AM     [facility]  [address] [place], [place] [address] [phone]

## 2020-08-31 ENCOUNTER — Telehealth: Payer: Self-pay

## 2020-08-31 LAB — BUN+CREAT
BUN/Creatinine Ratio: 10 — ABNORMAL LOW (ref 12–28)
BUN: 6 mg/dL — ABNORMAL LOW (ref 8–27)
Creatinine, Ser: 0.62 mg/dL (ref 0.57–1.00)
GFR calc Af Amer: 109 mL/min/{1.73_m2} (ref >59)
GFR calc non Af Amer: 95 mL/min/{1.73_m2} (ref >59)

## 2020-08-31 NOTE — Telephone Encounter (Signed)
Patient verbalized understanding of instructions  

## 2020-08-31 NOTE — Telephone Encounter (Signed)
Will be seen in clinic by you on September 02, 2020.  They have requested clearance for colonoscopy and Dr. Fletcher Anon has given permission to hold Plavix for 5 days prior to the procedure.  I will defer clearance to you given their Friday appointment with you.  I will removed from preop pool.

## 2020-08-31 NOTE — Telephone Encounter (Signed)
Will address at her OV.

## 2020-08-31 NOTE — Telephone Encounter (Signed)
Per cardiology patient can hold the Plavix 5 days before procedure and restart it the day after procedure. Called and left a message for call back

## 2020-09-01 ENCOUNTER — Ambulatory Visit
Admission: RE | Admit: 2020-09-01 | Discharge: 2020-09-01 | Disposition: A | Discharge status: Home / Self Care | Payer: Medicare PPO | Source: Ambulatory Visit | Attending: Internal Medicine | Admitting: Internal Medicine

## 2020-09-01 ENCOUNTER — Other Ambulatory Visit: Payer: Self-pay

## 2020-09-01 DIAGNOSIS — K8020 Calculus of gallbladder without cholecystitis without obstruction: Secondary | ICD-10-CM | POA: Diagnosis present

## 2020-09-01 MED ORDER — TECHNETIUM TC 99M MEBROFENIN IV KIT
5.0000 | PACK | Freq: Once | INTRAVENOUS | Status: AC | PRN
  Administered 2020-09-01: 4.941 via INTRAVENOUS

## 2020-09-02 ENCOUNTER — Ambulatory Visit: Payer: Medicare PPO | Admitting: Physician Assistant

## 2020-09-02 ENCOUNTER — Other Ambulatory Visit: Payer: Self-pay | Admitting: Internal Medicine

## 2020-09-02 ENCOUNTER — Encounter: Payer: Self-pay | Admitting: Physician Assistant

## 2020-09-02 VITALS — BP 140/84 | HR 73 | Ht 67.5 in | Wt 107.0 lb

## 2020-09-02 DIAGNOSIS — I2510 Atherosclerotic heart disease of native coronary artery without angina pectoris: Secondary | ICD-10-CM

## 2020-09-02 DIAGNOSIS — E785 Hyperlipidemia, unspecified: Secondary | ICD-10-CM

## 2020-09-02 DIAGNOSIS — Z01810 Encounter for preprocedural cardiovascular examination: Secondary | ICD-10-CM | POA: Diagnosis not present

## 2020-09-02 DIAGNOSIS — I739 Peripheral vascular disease, unspecified: Secondary | ICD-10-CM

## 2020-09-02 NOTE — Patient Instructions (Signed)
Medication Instructions:  Your physician recommends that you continue on your current medications as directed. Please refer to the Current Medication list given to you today.  *If you need a refill on your cardiac medications before your next appointment, please call your pharmacy*   Lab Work: None ordered If you have labs (blood work) drawn today and your tests are completely normal, you will receive your results only by: Marland Kitchen MyChart Message (if you have MyChart) OR . A paper copy in the mail If you have any lab test that is abnormal or we need to change your treatment, we will call you to review the results.   Testing/Procedures: Lower extremity  dopplers scheduled in March 2022   Follow-Up: At [facility], you and your health needs are our priority.  As part of our continuing mission to provide you with exceptional heart care, we have created designated Provider Care Teams.  These Care Teams include your primary Cardiologist (physician) and Advanced Practice Providers (APPs -  Physician Assistants and Nurse Practitioners) who all work together to provide you with the care you need, when you need it.  We recommend signing up for the patient portal called "MyChart".  Sign up information is provided on this After Visit Summary.  MyChart is used to connect with patients for Virtual Visits (Telemedicine).  Patients are able to view lab/test results, encounter notes, upcoming appointments, etc.  Non-urgent messages can be sent to your provider as well.   To learn more about what you can do with MyChart, go to [email or web address].    Your next appointment:   Your physician wants you to follow-up in: April 2022 you will receive a reminder letter in the mail two months in advance. If you don't receive a letter, please call our office to schedule the follow-up appointment.   The format for your next appointment:   In Person  Provider:   You may see [name], MD or one of the  following Advanced Practice Providers on your designated Care Team:    Lubitz Hodgkins, NP  Christell Faith, PA-C  Marrianne Mood, PA-C  Cadence Kathlen Mody, [place]    Other Instructions Ok to hold Plavix 5 days prior to your scheduled colonoscopy. Resume when the Gastroenterologist feels it is safe.

## 2020-09-04 NOTE — Progress Notes (Signed)
Your  HIDA was normal, so your GB is emptying fully .   Regards,   Deborra Medina, MD

## 2020-09-05 ENCOUNTER — Telehealth: Payer: Self-pay | Admitting: Internal Medicine

## 2020-09-05 ENCOUNTER — Other Ambulatory Visit: Payer: Self-pay

## 2020-09-05 ENCOUNTER — Ambulatory Visit
Admission: RE | Admit: 2020-09-05 | Discharge: 2020-09-05 | Disposition: A | Discharge status: Home / Self Care | Payer: Medicare PPO | Source: Ambulatory Visit | Attending: Internal Medicine | Admitting: Internal Medicine

## 2020-09-05 DIAGNOSIS — N63 Unspecified lump in unspecified breast: Secondary | ICD-10-CM

## 2020-09-05 DIAGNOSIS — E440 Moderate protein-calorie malnutrition: Secondary | ICD-10-CM

## 2020-09-05 DIAGNOSIS — N6311 Unspecified lump in the right breast, upper outer quadrant: Secondary | ICD-10-CM | POA: Diagnosis not present

## 2020-09-05 DIAGNOSIS — N6310 Unspecified lump in the right breast, unspecified quadrant: Secondary | ICD-10-CM

## 2020-09-05 HISTORY — DX: Unspecified lump in unspecified breast: N63.0

## 2020-09-05 NOTE — Assessment & Plan Note (Signed)
Nutritional referral was made but closed as patient did not return their calls

## 2020-09-14 ENCOUNTER — Ambulatory Visit: Payer: Medicare PPO

## 2020-09-16 ENCOUNTER — Encounter: Payer: Self-pay | Admitting: Gastroenterology

## 2020-09-20 ENCOUNTER — Ambulatory Visit
Admission: RE | Admit: 2020-09-20 | Discharge: 2020-09-20 | Disposition: A | Discharge status: Home / Self Care | Payer: Medicare PPO | Source: Ambulatory Visit | Attending: Gastroenterology | Admitting: Gastroenterology

## 2020-09-20 ENCOUNTER — Other Ambulatory Visit
Admission: RE | Admit: 2020-09-20 | Discharge: 2020-09-20 | Disposition: A | Discharge status: Home / Self Care | Payer: Medicare PPO | Source: Ambulatory Visit | Attending: Gastroenterology | Admitting: Gastroenterology

## 2020-09-20 ENCOUNTER — Other Ambulatory Visit: Payer: Self-pay

## 2020-09-20 DIAGNOSIS — R599 Enlarged lymph nodes, unspecified: Secondary | ICD-10-CM | POA: Insufficient documentation

## 2020-09-20 DIAGNOSIS — I700 Atherosclerosis of aorta: Secondary | ICD-10-CM | POA: Insufficient documentation

## 2020-09-20 DIAGNOSIS — K760 Fatty (change of) liver, not elsewhere classified: Secondary | ICD-10-CM | POA: Diagnosis not present

## 2020-09-20 DIAGNOSIS — Z20822 Contact with and (suspected) exposure to covid-19: Secondary | ICD-10-CM | POA: Diagnosis not present

## 2020-09-20 DIAGNOSIS — R197 Diarrhea, unspecified: Secondary | ICD-10-CM | POA: Diagnosis present

## 2020-09-20 LAB — SARS CORONAVIRUS 2 (TAT 6-24 HRS): SARS Coronavirus 2: NEGATIVE

## 2020-09-20 MED ORDER — IOHEXOL 300 MG/ML  SOLN
100.0000 mL | Freq: Once | INTRAMUSCULAR | Status: AC | PRN
  Administered 2020-09-20: 100 mL via INTRAVENOUS

## 2020-09-21 NOTE — Discharge Instructions (Signed)
General Anesthesia, Adult, Care After This sheet gives you information about how to care for yourself after your procedure. Your health care provider may also give you more specific instructions. If you have problems or questions, contact your health care provider. What can I expect after the procedure? After the procedure, the following side effects are common:  Pain or discomfort at the IV site.  Nausea.  Vomiting.  Sore throat.  Trouble concentrating.  Feeling cold or chills.  Weak or tired.  Sleepiness and fatigue.  Soreness and body aches. These side effects can affect parts of the body that were not involved in surgery. Follow these instructions at home:  For at least 24 hours after the procedure:  Have a responsible adult stay with you. It is important to have someone help care for you until you are awake and alert.  Rest as needed.  Do not: ? Participate in activities in which you could fall or become injured. ? Drive. ? Use heavy machinery. ? Drink alcohol. ? Take sleeping pills or medicines that cause drowsiness. ? Make important decisions or sign legal documents. ? Take care of children on your own. Eating and drinking  Follow any instructions from your health care provider about eating or drinking restrictions.  When you feel hungry, start by eating small amounts of foods that are soft and easy to digest (bland), such as toast. Gradually return to your regular diet.  Drink enough fluid to keep your urine pale yellow.  If you vomit, rehydrate by drinking water, juice, or clear broth. General instructions  If you have sleep apnea, surgery and certain medicines can increase your risk for breathing problems. Follow instructions from your health care provider about wearing your sleep device: ? Anytime you are sleeping, including during daytime naps. ? While taking prescription pain medicines, sleeping medicines, or medicines that make you drowsy.  Return to  your normal activities as told by your health care provider. Ask your health care provider what activities are safe for you.  Take over-the-counter and prescription medicines only as told by your health care provider.  If you smoke, do not smoke without supervision.  Keep all follow-up visits as told by your health care provider. This is important. Contact a health care provider if:  You have nausea or vomiting that does not get better with medicine.  You cannot eat or drink without vomiting.  You have pain that does not get better with medicine.  You are unable to pass urine.  You develop a skin rash.  You have a fever.  You have redness around your IV site that gets worse. Get help right away if:  You have difficulty breathing.  You have chest pain.  You have blood in your urine or stool, or you vomit blood. Summary  After the procedure, it is common to have a sore throat or nausea. It is also common to feel tired.  Have a responsible adult stay with you for the first 24 hours after general anesthesia. It is important to have someone help care for you until you are awake and alert.  When you feel hungry, start by eating small amounts of foods that are soft and easy to digest (bland), such as toast. Gradually return to your regular diet.  Drink enough fluid to keep your urine pale yellow.  Return to your normal activities as told by your health care provider. Ask your health care provider what activities are safe for you. This information is not   intended to replace advice given to you by your health care provider. Make sure you discuss any questions you have with your health care provider. Document Revised: 11/15/2017 Document Reviewed: 06/28/2017 Elsevier Patient Education  2020 Elsevier Inc.  

## 2020-09-22 ENCOUNTER — Other Ambulatory Visit: Payer: Self-pay

## 2020-09-22 ENCOUNTER — Ambulatory Visit: Payer: Medicare PPO | Admitting: Anesthesiology

## 2020-09-22 ENCOUNTER — Encounter
Admission: RE | Disposition: A | Discharge status: Home / Self Care | Payer: Self-pay | Source: Home / Self Care | Attending: Gastroenterology

## 2020-09-22 ENCOUNTER — Ambulatory Visit
Admission: RE | Admit: 2020-09-22 | Discharge: 2020-09-22 | Disposition: A | Discharge status: Home / Self Care | Payer: Medicare PPO | Attending: Gastroenterology | Admitting: Gastroenterology

## 2020-09-22 ENCOUNTER — Encounter: Payer: Self-pay | Admitting: Gastroenterology

## 2020-09-22 DIAGNOSIS — R197 Diarrhea, unspecified: Secondary | ICD-10-CM | POA: Diagnosis not present

## 2020-09-22 DIAGNOSIS — D120 Benign neoplasm of cecum: Secondary | ICD-10-CM | POA: Insufficient documentation

## 2020-09-22 DIAGNOSIS — Z885 Allergy status to narcotic agent status: Secondary | ICD-10-CM | POA: Insufficient documentation

## 2020-09-22 DIAGNOSIS — K635 Polyp of colon: Secondary | ICD-10-CM

## 2020-09-22 DIAGNOSIS — R599 Enlarged lymph nodes, unspecified: Secondary | ICD-10-CM | POA: Diagnosis not present

## 2020-09-22 DIAGNOSIS — K6389 Other specified diseases of intestine: Secondary | ICD-10-CM | POA: Diagnosis not present

## 2020-09-22 DIAGNOSIS — F17210 Nicotine dependence, cigarettes, uncomplicated: Secondary | ICD-10-CM | POA: Insufficient documentation

## 2020-09-22 DIAGNOSIS — Z888 Allergy status to other drugs, medicaments and biological substances status: Secondary | ICD-10-CM | POA: Insufficient documentation

## 2020-09-22 DIAGNOSIS — K529 Noninfective gastroenteritis and colitis, unspecified: Secondary | ICD-10-CM | POA: Insufficient documentation

## 2020-09-22 HISTORY — DX: Hypothyroidism, unspecified: E03.9

## 2020-09-22 HISTORY — PX: COLONOSCOPY WITH PROPOFOL: SHX5780

## 2020-09-22 HISTORY — DX: Hemochromatosis, unspecified: E83.119

## 2020-09-22 HISTORY — DX: Unspecified osteoarthritis, unspecified site: M19.90

## 2020-09-22 HISTORY — PX: POLYPECTOMY: SHX5525

## 2020-09-22 HISTORY — DX: Other specified postprocedural states: Z98.890

## 2020-09-22 HISTORY — DX: Nausea with vomiting, unspecified: R11.2

## 2020-09-22 SURGERY — COLONOSCOPY WITH PROPOFOL
Anesthesia: General | Site: Rectum

## 2020-09-22 MED ORDER — LACTATED RINGERS IV SOLN: INTRAVENOUS | Status: DC

## 2020-09-22 MED ORDER — PROPOFOL 10 MG/ML IV BOLUS
INTRAVENOUS | Status: DC | PRN
  Administered 2020-09-22: 50 mg via INTRAVENOUS
  Administered 2020-09-22: 60 mg via INTRAVENOUS
  Administered 2020-09-22: 50 mg via INTRAVENOUS
  Administered 2020-09-22: 100 mg via INTRAVENOUS
  Administered 2020-09-22: 40 mg via INTRAVENOUS

## 2020-09-22 MED ORDER — SODIUM CHLORIDE 0.9 % IV SOLN: INTRAVENOUS | Status: DC

## 2020-09-22 MED ORDER — LIDOCAINE HCL (CARDIAC) PF 100 MG/5ML IV SOSY
PREFILLED_SYRINGE | INTRAVENOUS | Status: DC | PRN
  Administered 2020-09-22: 50 mg via INTRAVENOUS

## 2020-09-22 SURGICAL SUPPLY — 11 items
CLIP HMST11XOPN 235X2.8X (MISCELLANEOUS) IMPLANT
CLIP RESOLUTION 360 11X235 (MISCELLANEOUS) ×3
FORCEPS BIOP RAD 4 LRG CAP 4 (CUTTING FORCEPS) ×1 IMPLANT
GOWN CVR UNV OPN BCK APRN NK (MISCELLANEOUS) ×4 IMPLANT
GOWN ISOL THUMB LOOP REG UNIV (MISCELLANEOUS) ×6
KIT PRC NS LF DISP ENDO (KITS) ×2 IMPLANT
KIT PROCEDURE OLYMPUS (KITS) ×3
MANIFOLD NEPTUNE II (INSTRUMENTS) ×3 IMPLANT
SNARE COLD EXACTO (MISCELLANEOUS) ×1 IMPLANT
TRAP ETRAP POLY (MISCELLANEOUS) ×1 IMPLANT
WATER STERILE IRR 250ML POUR (IV SOLUTION) ×3 IMPLANT

## 2020-09-22 NOTE — Op Note (Signed)
[facility]astroenter[facility] Patient Name: Tracie Morris Procedure Date: 09/22/2020 10:12 AM MRN: [id number] Account #: [id number] Date of Birth: Jan 17, 1956 Admit Type: Outpatient Age: 64 Room: [facility] OR ROOM 01 Gender: Female Note Status: Finalized Procedure:             Colonoscopy Indications:           Chronic diarrhea, Clinically significant diarrhea of                         unexplained origin Providers:             Lin Landsman MD, MD Referring MD:          Deborra Medina, MD (Referring MD) Medicines:             General Anesthesia Complications:         No immediate complications. Estimated blood loss:                         Minimal. Procedure:             Pre-Anesthesia Assessment:                        - Prior to the procedure, a History and Physical was                         performed, and patient medications and allergies were                         reviewed. The patient is competent. The risks and                         benefits of the procedure and the sedation options and                         risks were discussed with the patient. All questions                         were answered and informed consent was obtained.                         Patient identification and proposed procedure were                         verified by the physician, the nurse, the                         anesthesiologist, the anesthetist and the technician                         in the pre-procedure area in the procedure room in the                         endoscopy suite. Mental Status Examination: alert and                         oriented. Airway Examination: normal oropharyngeal  airway and neck mobility. Respiratory Examination:                         clear to auscultation. CV Examination: normal.                         Prophylactic Antibiotics: The patient does not require                         prophylactic antibiotics. Prior  Anticoagulants: The                         patient has taken no previous anticoagulant or                         antiplatelet agents. ASA Grade Assessment: III - A                         patient with severe systemic disease. After reviewing                         the risks and benefits, the patient was deemed in                         satisfactory condition to undergo the procedure. The                         anesthesia plan was to use general anesthesia.                         Immediately prior to administration of medications,                         the patient was re-assessed for adequacy to receive                         sedatives. The heart rate, respiratory rate, oxygen                         saturations, blood pressure, adequacy of pulmonary                         ventilation, and response to care were monitored                         throughout the procedure. The physical status of the                         patient was re-assessed after the procedure.                        After obtaining informed consent, the colonoscope was                         passed under direct vision. Throughout the procedure,                         the patient's blood pressure, pulse, and oxygen  saturations were monitored continuously. The was                         introduced through the anus and advanced to the the                         terminal ileum, with identification of the appendiceal                         orifice and IC valve. The colonoscopy was performed                         without difficulty. The patient tolerated the                         procedure well. The quality of the bowel preparation                         was adequate. Findings:      The perianal and digital rectal examinations were normal. Pertinent       negatives include normal sphincter tone and no palpable rectal lesions.      A patchy area of mucosa in the terminal ileum was  mildly scarred and       melanotic. Biopsies were taken with a cold forceps for histology.      Three sessile polyps were found in the cecum. The polyps were 3 to 7 mm       in size. These polyps were removed with a cold snare. Resection and       retrieval were complete. To prevent bleeding after the polypectomy, one       hemostatic clip was successfully placed (MR conditional). There was no       bleeding at the end of the procedure.      Normal mucosa was found in the entire colon. Biopsies were taken with a       cold forceps for histology.      The retroflexed view of the distal rectum and anal verge was normal and       showed no anal or rectal abnormalities. Impression:            - Scarred and melanotic mucosa in the terminal ileum.                         Biopsied.                        - Three 3 to 7 mm polyps in the cecum, removed with a                         cold snare. Resected and retrieved. Clip (MR                         conditional) was placed.                        - Normal mucosa in the entire examined colon. Biopsied.                        - The distal rectum and anal verge are  normal on                         retroflexion view. Recommendation:        - Discharge patient to home (with escort).                        - Resume previous diet today.                        - Continue present medications.                        - Await pathology results.                        - Repeat colonoscopy in 3 - 5 years for surveillance                         based on pathology results.                        - Return to my office as previously scheduled. Procedure Code(s):     --- Professional ---                        [id number], Colonoscopy, flexible; with removal of                         tumor(s), polyp(s), or other lesion(s) by snare                         technique                        45380, 27, Colonoscopy, flexible; with biopsy, single                          or multiple Diagnosis Code(s):     --- Professional ---                        K63.89, Other specified diseases of intestine                        K63.5, Polyp of colon                        K52.9, Noninfective gastroenteritis and colitis,                         unspecified                        R19.7, Diarrhea, unspecified CPT copyright 2019 American Medical Association. All rights reserved. The codes documented in this report are preliminary and upon coder review may  be revised to meet current compliance requirements. Dr. Ulyess Mort Lin Landsman MD, MD 09/22/2020 11:02:08 AM This report has been signed electronically. Number of Addenda: 0 Note Initiated On: 09/22/2020 10:12 AM Scope Withdrawal Time: 0 hours 27 minutes 20 seconds  Total Procedure Duration: 0 hours 29 minutes 31 seconds  Estimated Blood Loss:  Estimated blood loss was minimal.  [facility]

## 2020-09-22 NOTE — H&P (Signed)
Cephas Darby, MD [address]  [place]  [place], [place] [address]  Main: [phone]  Fax: [phone] Pager: [phone]  Primary Care Physician:  [name], MD Primary Gastroenterologist:  Dr. Cephas Darby  Pre-Procedure History & Physical: HPI:  Tracie Morris is a 64 y.o. female is here for an colonoscopy.   Past Medical History:  Diagnosis Date  . Arthritis   . Basal cell carcinoma 03/29/2014   L lower abdomen  . Basal cell carcinoma 01/18/2020   central nasal tip  . Breast mass 09/05/2020   right breast mass 10 oclock  . Carotid artery occlusion   . Hemochromatosis   . Hyperlipidemia   . Hypocalcemia   . hypothyroidism 2007   acquired, secondary to surgery for thyroid Ca  . Hypothyroidism   . Migraine    improved since thyroid surgery, 2 years  . multinodular goiter 2007   s/p thyroidectomy  . 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 Repeat angiography in November of 2014 showed 50% distal aortic stenosis extending into the left common iliac artery which had 60-70% ostial stenosis. No other significant disease. I placed on balloon expandable stent into  left common iliac artery extending slightly into the distal aorta  . PONV (postoperative nausea and vomiting)   . 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 ABDOMINAL AORTAGRAM N/A 10/14/2013   Procedure: ABDOMINAL [name];  Surgeon: Wellington Hampshire, MD;  Location: [place] CATH LAB;  Service: Cardiovascular;  Laterality: N/A;  . ANGIOPLASTY ILLIAC ARTERY    . CESAREAN SECTION    . NOSE SURGERY    . PERIPHERAL VASCULAR CATHETERIZATION N/A 11/14/2016   Procedure: Abdominal Aortogram;  Surgeon: Wellington Hampshire, MD;  Location: [place] CV LAB;  Service: Cardiovascular;  Laterality: N/A;  . PERIPHERAL  VASCULAR CATHETERIZATION Bilateral 11/14/2016   Procedure: Lower Extremity Angiography;  Surgeon: Wellington Hampshire, MD;  Location: [place] CV LAB;  Service: Cardiovascular;  Laterality: Bilateral;  limited iliacs only  . PERIPHERAL VASCULAR CATHETERIZATION Left 11/14/2016   Procedure: Peripheral Vascular Intervention;  Surgeon: Wellington Hampshire, MD;  Location: [place] CV LAB;  Service: Cardiovascular;  Laterality: Left;  common iliac  . SKIN CANCER EXCISION    . THYROID SURGERY  2007   secondary to thyroid cancer    Prior to Admission medications   Medication Sig Start Date End Date Taking? Authorizing Provider  Colostrum 500 MG CAPS Take 1 capsule by mouth daily.   Yes [provider]  escitalopram (LEXAPRO) 10 MG tablet Take 1 tablet (10 mg total) by mouth daily. 08/03/20  Yes [name], MD  levothyroxine (SYNTHROID) 100 MCG tablet Take 1 tablet (100 mcg total) by mouth daily before breakfast. 05/20/20  Yes Philemon Kingdom, MD  Multiple Vitamin (MULTIVITAMIN WITH MINERALS) TABS tablet Take 1 tablet by mouth daily.   Yes [provider]  propranolol (INDERAL) 10 MG tablet TAKE 1 TABLET BY MOUTH 3 TIMES DAILY AS NEEDED FOR TREMOR 09/02/20  Yes [name], MD  rosuvastatin (CRESTOR) 20 MG tablet Take 1 tablet (20 mg total) by mouth daily. 03/07/20  Yes Dunn, Areta Haber, PA-C  clopidogrel (PLAVIX) 75 MG tablet TAKE 1 TABLET BY MOUTH ONCE DAILY 09/21/19   Wellington Hampshire, MD    Allergies  as of 08/30/2020 - Review Complete 08/30/2020  Allergen Reaction Noted  . Atorvastatin Other (See Comments) 01/31/2015  . Darvocet [propoxyphene n-acetaminophen] Nausea And Vomiting 10/19/2011  . Demerol Nausea And Vomiting 10/19/2011  . Methocarbamol Other (See Comments) 01/31/2015  . Mirtazapine  04/09/2016  . Primidone  08/18/2018  . Ambien [zolpidem tartrate] Palpitations 01/31/2015    Family History  Problem Relation Age of Onset  . Alzheimer's disease Father   .  Stroke Mother   . Coronary artery disease Maternal Grandmother   . Heart disease Maternal Grandmother 70  . Coronary artery disease Paternal Grandmother 22  . Heart disease Paternal Grandmother 65  . Breast cancer Neg Hx     Social History   Socioeconomic History  . Marital status: Married    Spouse name: Not on file  . Number of children: Not on file  . Years of education: Not on file  . Highest education level: Not on file  Occupational History  . Not on file  Tobacco Use  . Smoking status: Current Every Day Smoker    Packs/day: 1.00    Years: 20.00    Pack years: 20.00    Types: Cigarettes    Last attempt to quit: 10/28/2011    Years since quitting: 8.9  . Smokeless tobacco: Never Used  Vaping Use  . Vaping Use: Former  Substance and Sexual Activity  . Alcohol use: Not Currently  . Drug use: No  . Sexual activity: Not on file  Other Topics Concern  . Not on file  Social History Narrative  . Not on file   Social Determinants of Health   Financial Resource Strain:   . Difficulty of Paying Living Expenses: Not on file  Food Insecurity:   . Worried About Charity fundraiser in the Last Year: Not on file  . Ran Out of Food in the Last Year: Not on file  Transportation Needs:   . Lack of Transportation (Medical): Not on file  . Lack of Transportation (Non-Medical): Not on file  Physical Activity:   . Days of Exercise per Week: Not on file  . Minutes of Exercise per Session: Not on file  Stress:   . Feeling of Stress : Not on file  Social Connections:   . Frequency of Communication with Friends and Family: Not on file  . Frequency of Social Gatherings with Friends and Family: Not on file  . Attends Religious Services: Not on file  . Active Member of Clubs or Organizations: Not on file  . Attends Archivist Meetings: Not on file  . Marital Status: Not on file  Intimate Partner Violence:   . Fear of Current or Ex-Partner: Not on file  . Emotionally  Abused: Not on file  . Physically Abused: Not on file  . Sexually Abused: Not on file    Review of Systems: See HPI, otherwise negative ROS  Physical Exam: BP 130/64   Pulse 64   Temp (!) 96.6 F (35.9 C)   Ht 5' 7.5" (1.715 m)   Wt 46.7 kg   SpO2 99%   BMI 15.89 kg/m  General:   Alert,  pleasant and cooperative in NAD Head:  Normocephalic and atraumatic. Neck:  Supple; no masses or thyromegaly. Lungs:  Clear throughout to auscultation.    Heart:  Regular rate and rhythm. Abdomen:  Soft, nontender and nondistended. Normal bowel sounds, without guarding, and without rebound.   Neurologic:  Alert and  oriented x4;  grossly  normal neurologically.  Impression/Plan: [place] is here for an colonoscopy to be performed for chronic diarrhea  Risks, benefits, limitations, and alternatives regarding  colonoscopy have been reviewed with the patient.  Questions have been answered.  All parties agreeable.   Sherri Sear, MD  09/22/2020, 8:21 AM

## 2020-09-22 NOTE — Anesthesia Postprocedure Evaluation (Signed)
Anesthesia Post Note  Patient: Tracie Morris  Procedure(s) Performed: COLONOSCOPY WITH PROPOFOL (N/A Rectum) POLYPECTOMY (Rectum)     Patient location during evaluation: PACU Anesthesia Type: General Level of consciousness: awake and alert and oriented Pain management: satisfactory to patient Vital Signs Assessment: post-procedure vital signs reviewed and stable Respiratory status: spontaneous breathing, nonlabored ventilation and respiratory function stable Cardiovascular status: blood pressure returned to baseline and stable Postop Assessment: Adequate PO intake and No signs of nausea or vomiting Anesthetic complications: no   No complications documented.  Raliegh Ip

## 2020-09-22 NOTE — Anesthesia Preprocedure Evaluation (Signed)
Anesthesia Evaluation  Patient identified by MRN, date of birth, ID band Patient awake    Reviewed: Allergy & Precautions, H&P , NPO status , Patient's Chart, lab work & pertinent test results  Airway Mallampati: II  TM Distance: >3 FB Neck ROM: full    Dental no notable dental hx.    Pulmonary Current SmokerPatient did not abstain from smoking.,    Pulmonary exam normal breath sounds clear to auscultation       Cardiovascular + CAD and + Peripheral Vascular Disease  Normal cardiovascular exam Rhythm:regular Rate:Normal     Neuro/Psych PSYCHIATRIC DISORDERS    GI/Hepatic (+) Hepatitis -  Endo/Other  Hypothyroidism   Renal/GU      Musculoskeletal   Abdominal   Peds  Hematology   Anesthesia Other Findings   Reproductive/Obstetrics                             Anesthesia Physical Anesthesia Plan  ASA: III  Anesthesia Plan: General   Post-op Pain Management:    Induction: Intravenous  PONV Risk Score and Plan: 3 and Treatment may vary due to age or medical condition, Propofol infusion and TIVA  Airway Management Planned: Natural Airway  Additional Equipment:   Intra-op Plan:   Post-operative Plan:   Informed Consent: I have reviewed the patients History and Physical, chart, labs and discussed the procedure including the risks, benefits and alternatives for the proposed anesthesia with the patient or authorized representative who has indicated his/her understanding and acceptance.     Dental Advisory Given  Plan Discussed with: CRNA  Anesthesia Plan Comments:         Anesthesia Quick Evaluation

## 2020-09-22 NOTE — Anesthesia Procedure Notes (Signed)
Procedure Name: General with mask airway Date/Time: 09/22/2020 10:28 AM Performed by: Jeannene Patella, CRNA Pre-anesthesia Checklist: Patient identified, Emergency Drugs available, Suction available, Timeout performed and Patient being monitored Patient Re-evaluated:Patient Re-evaluated prior to induction Oxygen Delivery Method: Nasal cannula Placement Confirmation: positive ETCO2

## 2020-09-22 NOTE — Transfer of Care (Signed)
Immediate Anesthesia Transfer of Care Note  Patient: Tracie Morris  Procedure(s) Performed: COLONOSCOPY WITH PROPOFOL (N/A Rectum) POLYPECTOMY (Rectum)  Patient Location: PACU  Anesthesia Type: General  Level of Consciousness: awake, alert  and patient cooperative  Airway and Oxygen Therapy: Patient Spontanous Breathing and Patient connected to supplemental oxygen  Post-op Assessment: Post-op Vital signs reviewed, Patient's Cardiovascular Status Stable, Respiratory Function Stable, Patent Airway and No signs of Nausea or vomiting  Post-op Vital Signs: Reviewed and stable  Complications: No complications documented.

## 2020-09-23 ENCOUNTER — Encounter: Payer: Self-pay | Admitting: Gastroenterology

## 2020-09-23 LAB — SURGICAL PATHOLOGY

## 2020-09-26 ENCOUNTER — Encounter: Payer: Self-pay | Admitting: Gastroenterology

## 2020-09-26 ENCOUNTER — Telehealth: Payer: Self-pay | Admitting: Cardiovascular Disease

## 2020-09-26 ENCOUNTER — Telehealth: Payer: Self-pay

## 2020-09-26 NOTE — Telephone Encounter (Signed)
-----   Message from Lin Landsman, MD sent at 09/26/2020 12:26 PM EDT ----- Biopsy results from colonoscopy came back negative to explain diarrhea.  Recommend celiac panel to screen for celiac disease  Rohini Vanga

## 2020-09-26 NOTE — Telephone Encounter (Signed)
Called and patient verbalized of understanding of results. Patient states she is not having the celiac panel done because she is not having any diarrhea anymore. Patient states from holding the blood thinner she now has a blood clot in her leg and see the cardiologist tomorrow

## 2020-09-26 NOTE — Telephone Encounter (Signed)
Patient states she had to stop her blood thinner due to a colonoscopy last week. States when she returned home that day, she started feeling like her left leg was "dead". States there is no discoloration, no swelling. States "everything looks normal". States her toes and leg feels numb. Please call to discuss.

## 2020-09-26 NOTE — Telephone Encounter (Addendum)
Spoke with the patient. Patient sts that since she had her colonoscopy on 09/22/20 she has developed pain with ambulation in her left leg. She denies swelling or warmth to the touch. Patient sts that the pain is similar to the claudication she had prior to her PV procedure a couple of years ago. In anticipation for her colonoscopy plavix was held for a total of 6 days.  Patient PV testing is not scheduled until March 2022. Adv the patient that I would recommend an appt with Dr. Fletcher Anon for evaluation.  Appt scheduled for 09/27/20 @ 4:40pm with Dr. Fletcher Anon. Patient is aware of the appt date and time. Patient voiced appreciation for the assistance.

## 2020-09-27 ENCOUNTER — Ambulatory Visit: Payer: Medicare PPO | Admitting: Cardiovascular Disease

## 2020-09-27 ENCOUNTER — Encounter: Payer: Self-pay | Admitting: Cardiovascular Disease

## 2020-09-27 ENCOUNTER — Other Ambulatory Visit: Payer: Self-pay

## 2020-09-27 VITALS — BP 140/80 | HR 89 | Ht 67.5 in | Wt 107.0 lb

## 2020-09-27 DIAGNOSIS — I739 Peripheral vascular disease, unspecified: Secondary | ICD-10-CM | POA: Diagnosis not present

## 2020-09-27 DIAGNOSIS — E7800 Pure hypercholesterolemia, unspecified: Secondary | ICD-10-CM | POA: Diagnosis not present

## 2020-09-27 DIAGNOSIS — Z720 Tobacco use: Secondary | ICD-10-CM

## 2020-09-27 MED ORDER — ASPIRIN EC 81 MG PO TBEC: 81.0000 mg | DELAYED_RELEASE_TABLET | Freq: Every day | ORAL | Status: DC

## 2020-09-27 NOTE — Progress Notes (Signed)
Cardiology Office Note   Date:  09/27/2020   ID:  Tracie Morris, DOB September 09, 1956, MRN [id number]  PCP:  [name], MD  Cardiologist:   [name], MD   Chief Complaint  Patient presents with  . OTHER    Leg pain and claudication.  *Refused EKG TODAY. Meds reviewed verbally with pt.      History of Present Illness: Tracie Morris is a 64 y.o. female who presents for a followup visit regarding peripheral arterial disease.  She is status post left common iliac artery stent placement in November of 2014 for significant claudication with subsequent covered stent placement in 2017 for in situ thrombosis with subtotal occlusion. She was diagnosed with hereditary hemochromatosis which might have been the trigger for very late stent thrombosis.  Other Medical history include  tobacco use and hyperlipidemia   Previous CT scan of the chest did show calcifications in the coronary arteries. 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  She had recent colonoscopy and both Plavix and Colostrum were held before the procedure.  The day of colonoscopy, she started having severe left leg claudication.  The symptoms subsequently improved after a day but she continues to have significant discomfort with walking.  She has no symptoms at rest.  The symptoms are very similar to when she had her left iliac disease.  No chest pain or shortness of breath.  Unfortunately, she continues to smoke.  Past Medical History:  Diagnosis Date  . Arthritis   . Basal cell carcinoma 03/29/2014   L lower abdomen  . Basal cell carcinoma 01/18/2020   central nasal tip  . Breast mass 09/05/2020   right breast mass 10 oclock  . Carotid artery occlusion   . Hemochromatosis   . Hyperlipidemia   . Hypocalcemia   . hypothyroidism 2007   acquired, secondary to surgery for thyroid Ca  . Hypothyroidism   . Migraine    improved since thyroid surgery, 2 years  . multinodular goiter  2007   s/p thyroidectomy  . 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 Repeat angiography in November of 2014 showed 50% distal aortic stenosis extending into the left common iliac artery which had 60-70% ostial stenosis. No other significant disease. I placed on balloon expandable stent into  left common iliac artery extending slightly into the distal aorta  . PONV (postoperative nausea and vomiting)   . 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 ABDOMINAL AORTAGRAM N/A 10/14/2013   Procedure: ABDOMINAL [name];  Surgeon: Wellington Hampshire, MD;  Location: [place] CATH [facility];  Service: Cardiovascular;  Laterality: N/A;  . ANGIOPLASTY ILLIAC ARTERY    . CESAREAN SECTION    . COLONOSCOPY WITH PROPOFOL N/A 09/22/2020   Procedure: COLONOSCOPY WITH PROPOFOL;  Surgeon: Lin Landsman, MD;  Location: [facility];  Service: Endoscopy;  Laterality: N/A;  . NOSE SURGERY    . PERIPHERAL VASCULAR CATHETERIZATION N/A 11/14/2016   Procedure: Abdominal Aortogram;  Surgeon: Wellington Hampshire, MD;  Location: [facility];  Service: Cardiovascular;  Laterality: N/A;  . PERIPHERAL VASCULAR CATHETERIZATION Bilateral 11/14/2016   Procedure: Lower Extremity Angiography;  Surgeon: Wellington Hampshire, MD;  Location: [facility];  Service: Cardiovascular;  Laterality: Bilateral;  limited iliacs only  .  PERIPHERAL VASCULAR CATHETERIZATION Left 11/14/2016   Procedure: Peripheral Vascular Intervention;  Surgeon: Wellington Hampshire, MD;  Location: [place] [facility];  Service: Cardiovascular;  Laterality: Left;  common iliac  . POLYPECTOMY  09/22/2020   Procedure: POLYPECTOMY;  Surgeon: Lin Landsman, MD;  Location: [facility];  Service: Endoscopy;;  . SKIN CANCER EXCISION    . THYROID SURGERY  2007    secondary to thyroid cancer     Current Outpatient Medications  Medication Sig Dispense Refill  . clopidogrel (PLAVIX) 75 MG tablet TAKE 1 TABLET BY MOUTH ONCE DAILY 90 tablet 1  . Colostrum 500 MG CAPS Take 1 capsule by mouth daily.    Marland Kitchen escitalopram (LEXAPRO) 10 MG tablet Take 1 tablet (10 mg total) by mouth daily. 30 tablet 5  . levothyroxine (SYNTHROID) 100 MCG tablet Take 1 tablet (100 mcg total) by mouth daily before breakfast. 60 tablet 3  . Multiple Vitamin (MULTIVITAMIN WITH MINERALS) TABS tablet Take 1 tablet by mouth daily.    . propranolol (INDERAL) 10 MG tablet TAKE 1 TABLET BY MOUTH 3 TIMES DAILY AS NEEDED FOR TREMOR 90 tablet 1  . rosuvastatin (CRESTOR) 20 MG tablet Take 1 tablet (20 mg total) by mouth daily. 90 tablet 3   No current facility-administered medications for this visit.    Allergies:   Atorvastatin, Darvocet [propoxyphene n-acetaminophen], Demerol, Methocarbamol, Mirtazapine, Primidone, and Ambien [zolpidem tartrate]    Social History:  The patient  reports that she has been smoking cigarettes. She has a 20.00 pack-year smoking history. She has never used smokeless tobacco. She reports previous alcohol use. She reports that she does not use drugs.   Family History:  The patient's family history includes Alzheimer's disease in her father; Coronary artery disease in her maternal grandmother; Coronary artery disease (age of onset: 11) in her paternal grandmother; Heart disease (age of onset: 54) in her maternal grandmother; Heart disease (age of onset: 12) in her paternal grandmother; Stroke in her mother.    ROS:  Please see the history of present illness.   Otherwise, review of systems are positive for none.   All other systems are reviewed and negative.    PHYSICAL EXAM: VS:  BP 140/80 (BP Location: Left Arm, Patient Position: Sitting, Cuff Size: Normal)   Pulse 89   Ht 5' 7.5" (1.715 m)   Wt 107 lb (48.5 kg)   SpO2 99%   BMI 16.51 kg/m  , BMI Body mass  index is 16.51 kg/m. GEN: Well nourished, well developed, in no acute distress  HEENT: normal  Neck: no JVD, carotid bruits, or masses Cardiac: RRR; no murmurs, rubs, or gallops,no edema  Respiratory:  clear to auscultation bilaterally, normal work of breathing GI: soft, nontender, nondistended, + BS MS: no deformity or atrophy  Skin: warm and dry, no rash Neuro:  Strength and sensation are intact Psych: euthymic mood, full affect Vascular: Femoral pulse: +2 on the right and very faint on the left.  Distal pulses are nonpalpable on the left but +2 on the right.  Both toes are bluish but worse on the left side.  This is a chronic issue for her but seems to be worse today on the left side.  EKG:  EKG is  Not ordered today.    Recent Labs: 05/20/2020: TSH 0.03 07/29/2020: ALT 10; Potassium 3.5; Sodium 140 08/17/2020: Hemoglobin 16.8; Platelets 201 08/30/2020: BUN 6; Creatinine, Ser 0.62    Lipid Panel    Component Value Date/Time  CHOL 196 02/29/2020 1206   TRIG 96 02/29/2020 1206   HDL 46 02/29/2020 1206   CHOLHDL 4.3 02/29/2020 1206   CHOLHDL 3 02/23/2019 0923   VLDL 29.6 02/23/2019 0923   LDLCALC 133 (H) 02/29/2020 1206      Wt Readings from Last 3 Encounters:  09/27/20 107 lb (48.5 kg)  09/22/20 103 lb (46.7 kg)  09/02/20 107 lb (48.5 kg)        ASSESSMENT AND PLAN:  1.  Peripheral arterial disease: Status post left common iliac artery covered stent placement due to in situ thrombosis in 2017.  Recurrent symptoms and physical exam findings are concerning for possible occlusion of the iliac stent in the setting of interruption of Plavix and underlying hemochromatosis leading to hypercoagulable state.  Fortunately, she has no rest pain or lower extremity ulceration.  I am going to obtain an ABI and aortoiliac duplex.  I asked her to start taking aspirin 81 mg once daily in addition to Plavix.  Most likely, we will have to proceed with an angiogram pending Doppler  findings.  2. Hyperlipidemia: Continue rosuvastatin 20 mg daily.  3. Carotid disease: This was mild on recent carotid Dopple in May.  4. Coronary calcifications: No anginal symptoms. Continue aggressive treatment of risk factors.  5. Tobacco use: I again discussed with her the importance of smoking cessation.   Disposition:   To be decided after Doppler studies.  Signed,  [name], MD  09/27/2020 4:46 PM    [place]

## 2020-09-27 NOTE — Patient Instructions (Signed)
Medication Instructions:  Your physician has recommended you make the following change in your medication:  START Aspirin 81mg  once daily. You may purchase this over-the-counter.   *If you need a refill on your cardiac medications before your next appointment, please call your pharmacy*   Lab Work: None ordered.   If you have labs (blood work) drawn today and your tests are completely normal, you will receive your results only by:  [place] (if you have MyChart) OR  A paper copy in the mail If you have any lab test that is abnormal or we need to change your treatment, we will call you to review the results.   Testing/Procedures: Your physician has requested that you have an ankle brachial index (ABI). During this test an ultrasound and blood pressure cuff are used to evaluate the arteries that supply the arms and legs with blood. Allow thirty minutes for this exam. There are no restrictions or special instructions.  Your physician has requested that you have an Aortia/IVC/Iliac Duplex.   (Both tests are to be scheduled this week)   Follow-Up: At [facility], you and your health needs are our priority.  As part of our continuing mission to provide you with exceptional heart care, we have created designated Provider Care Teams.  These Care Teams include your primary Cardiologist (physician) and Advanced Practice Providers (APPs -  Physician Assistants and Nurse Practitioners) who all work together to provide you with the care you need, when you need it.  We recommend signing up for the patient portal called "MyChart".  Sign up information is provided on this After Visit Summary.  MyChart is used to connect with patients for Virtual Visits (Telemedicine).  Patients are able to view lab/test results, encounter notes, upcoming appointments, etc.  Non-urgent messages can be sent to your provider as well.   To learn more about what you can do with MyChart, go to  [email or web address].    Your next appointment:   To be scheduled pending test results.  The format for your next appointment:   In Person  Provider:   You may see [name], MD or one of the following Advanced Practice Providers on your designated Care Team:    Hustead Hodgkins, NP  Christell Faith, PA-C  Marrianne Mood, PA-C  Cadence Kathlen Mody, [place]    Other Instructions None

## 2020-09-27 NOTE — H&P (View-Only) (Signed)
Cardiology Office Note   Date:  09/27/2020   ID:  Orville Widmann, DOB [birth year], MRN [id number]  PCP:  [name], MD  Cardiologist:   [name], MD   Chief Complaint  Patient presents with  . OTHER    Leg pain and claudication.  *Refused EKG TODAY. Meds reviewed verbally with pt.      History of Present Illness: Sache Sane is a 64 y.o. female who presents for a followup visit regarding peripheral arterial disease.  She is status post left common iliac artery stent placement in November of 2014 for significant claudication with subsequent covered stent placement in 2017 for in situ thrombosis with subtotal occlusion. She was diagnosed with hereditary hemochromatosis which might have been the trigger for very late stent thrombosis.  Other Medical history include  tobacco use and hyperlipidemia   Previous CT scan of the chest did show calcifications in the coronary arteries. 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  She had recent colonoscopy and both Plavix and Colostrum were held before the procedure.  The day of colonoscopy, she started having severe left leg claudication.  The symptoms subsequently improved after a day but she continues to have significant discomfort with walking.  She has no symptoms at rest.  The symptoms are very similar to when she had her left iliac disease.  No chest pain or shortness of breath.  Unfortunately, she continues to smoke.  Past Medical History:  Diagnosis Date  . Arthritis   . Basal cell carcinoma 03/29/2014   L lower abdomen  . Basal cell carcinoma 01/18/2020   central nasal tip  . Breast mass 09/05/2020   right breast mass 10 oclock  . Carotid artery occlusion   . Hemochromatosis   . Hyperlipidemia   . Hypocalcemia   . hypothyroidism 2007   acquired, secondary to surgery for thyroid Ca  . Hypothyroidism   . Migraine    improved since thyroid surgery, 2 years  . multinodular goiter  2007   s/p thyroidectomy  . 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 Repeat angiography in November of 2014 showed 50% distal aortic stenosis extending into the left common iliac artery which had 60-70% ostial stenosis. No other significant disease. I placed on balloon expandable stent into  left common iliac artery extending slightly into the distal aorta  . PONV (postoperative nausea and vomiting)   . 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 ABDOMINAL AORTAGRAM N/A 10/14/2013   Procedure: ABDOMINAL [name];  Surgeon: Wellington Hampshire, MD;  Location: [place] CATH [facility];  Service: Cardiovascular;  Laterality: N/A;  . ANGIOPLASTY ILLIAC ARTERY    . CESAREAN SECTION    . COLONOSCOPY WITH PROPOFOL N/A 09/22/2020   Procedure: COLONOSCOPY WITH PROPOFOL;  Surgeon: Lin Landsman, MD;  Location: [facility];  Service: Endoscopy;  Laterality: N/A;  . NOSE SURGERY    . PERIPHERAL VASCULAR CATHETERIZATION N/A 11/14/2016   Procedure: Abdominal Aortogram;  Surgeon: Wellington Hampshire, MD;  Location: [place] CV LAB;  Service: Cardiovascular;  Laterality: N/A;  . PERIPHERAL VASCULAR CATHETERIZATION Bilateral 11/14/2016   Procedure: Lower Extremity Angiography;  Surgeon: Wellington Hampshire, MD;  Location: [place] CV LAB;  Service: Cardiovascular;  Laterality: Bilateral;  limited iliacs only  .  PERIPHERAL VASCULAR CATHETERIZATION Left 11/14/2016   Procedure: Peripheral Vascular Intervention;  Surgeon: Wellington Hampshire, MD;  Location: [place] [facility];  Service: Cardiovascular;  Laterality: Left;  common iliac  . POLYPECTOMY  09/22/2020   Procedure: POLYPECTOMY;  Surgeon: Lin Landsman, MD;  Location: [facility];  Service: Endoscopy;;  . SKIN CANCER EXCISION    . THYROID SURGERY  2007    secondary to thyroid cancer     Current Outpatient Medications  Medication Sig Dispense Refill  . clopidogrel (PLAVIX) 75 MG tablet TAKE 1 TABLET BY MOUTH ONCE DAILY 90 tablet 1  . Colostrum 500 MG CAPS Take 1 capsule by mouth daily.    Marland Kitchen escitalopram (LEXAPRO) 10 MG tablet Take 1 tablet (10 mg total) by mouth daily. 30 tablet 5  . levothyroxine (SYNTHROID) 100 MCG tablet Take 1 tablet (100 mcg total) by mouth daily before breakfast. 60 tablet 3  . Multiple Vitamin (MULTIVITAMIN WITH MINERALS) TABS tablet Take 1 tablet by mouth daily.    . propranolol (INDERAL) 10 MG tablet TAKE 1 TABLET BY MOUTH 3 TIMES DAILY AS NEEDED FOR TREMOR 90 tablet 1  . rosuvastatin (CRESTOR) 20 MG tablet Take 1 tablet (20 mg total) by mouth daily. 90 tablet 3   No current facility-administered medications for this visit.    Allergies:   Atorvastatin, Darvocet [propoxyphene n-acetaminophen], Demerol, Methocarbamol, Mirtazapine, Primidone, and Ambien [zolpidem tartrate]    Social History:  The patient  reports that she has been smoking cigarettes. She has a 20.00 pack-year smoking history. She has never used smokeless tobacco. She reports previous alcohol use. She reports that she does not use drugs.   Family History:  The patient's family history includes Alzheimer's disease in her father; Coronary artery disease in her maternal grandmother; Coronary artery disease (age of onset: 3) in her paternal grandmother; Heart disease (age of onset: 25) in her maternal grandmother; Heart disease (age of onset: 92) in her paternal grandmother; Stroke in her mother.    ROS:  Please see the history of present illness.   Otherwise, review of systems are positive for none.   All other systems are reviewed and negative.    PHYSICAL EXAM: VS:  BP 140/80 (BP Location: Left Arm, Patient Position: Sitting, Cuff Size: Normal)   Pulse 89   Ht 5' 7.5" (1.715 m)   Wt 107 lb (48.5 kg)   SpO2 99%   BMI 16.51 kg/m  , BMI Body mass  index is 16.51 kg/m. GEN: Well nourished, well developed, in no acute distress  HEENT: normal  Neck: no JVD, carotid bruits, or masses Cardiac: RRR; no murmurs, rubs, or gallops,no edema  Respiratory:  clear to auscultation bilaterally, normal work of breathing GI: soft, nontender, nondistended, + BS MS: no deformity or atrophy  Skin: warm and dry, no rash Neuro:  Strength and sensation are intact Psych: euthymic mood, full affect Vascular: Femoral pulse: +2 on the right and very faint on the left.  Distal pulses are nonpalpable on the left but +2 on the right.  Both toes are bluish but worse on the left side.  This is a chronic issue for her but seems to be worse today on the left side.  EKG:  EKG is  Not ordered today.    Recent Labs: 05/20/2020: TSH 0.03 07/29/2020: ALT 10; Potassium 3.5; Sodium 140 08/17/2020: Hemoglobin 16.8; Platelets 201 08/30/2020: BUN 6; Creatinine, Ser 0.62    Lipid Panel    Component Value Date/Time  CHOL 196 02/29/2020 1206   TRIG 96 02/29/2020 1206   HDL 46 02/29/2020 1206   CHOLHDL 4.3 02/29/2020 1206   CHOLHDL 3 02/23/2019 0923   VLDL 29.6 02/23/2019 0923   LDLCALC 133 (H) 02/29/2020 1206      Wt Readings from Last 3 Encounters:  09/27/20 107 lb (48.5 kg)  09/22/20 103 lb (46.7 kg)  09/02/20 107 lb (48.5 kg)        ASSESSMENT AND PLAN:  1.  Peripheral arterial disease: Status post left common iliac artery covered stent placement due to in situ thrombosis in 2017.  Recurrent symptoms and physical exam findings are concerning for possible occlusion of the iliac stent in the setting of interruption of Plavix and underlying hemochromatosis leading to hypercoagulable state.  Fortunately, she has no rest pain or lower extremity ulceration.  I am going to obtain an ABI and aortoiliac duplex.  I asked her to start taking aspirin 81 mg once daily in addition to Plavix.  Most likely, we will have to proceed with an angiogram pending Doppler  findings.  2. Hyperlipidemia: Continue rosuvastatin 20 mg daily.  3. Carotid disease: This was mild on recent carotid Dopple in May.  4. Coronary calcifications: No anginal symptoms. Continue aggressive treatment of risk factors.  5. Tobacco use: I again discussed with her the importance of smoking cessation.   Disposition:   To be decided after Doppler studies.  Signed,  [name], MD  09/27/2020 4:46 PM    [place]

## 2020-09-29 ENCOUNTER — Ambulatory Visit (INDEPENDENT_AMBULATORY_CARE_PROVIDER_SITE_OTHER): Payer: Medicare PPO

## 2020-09-29 ENCOUNTER — Other Ambulatory Visit: Payer: Self-pay

## 2020-09-29 DIAGNOSIS — I739 Peripheral vascular disease, unspecified: Secondary | ICD-10-CM | POA: Diagnosis not present

## 2020-09-29 DIAGNOSIS — Z95820 Peripheral vascular angioplasty status with implants and grafts: Secondary | ICD-10-CM

## 2020-10-03 ENCOUNTER — Telehealth: Payer: Self-pay | Admitting: Cardiovascular Disease

## 2020-10-03 DIAGNOSIS — I739 Peripheral vascular disease, unspecified: Secondary | ICD-10-CM

## 2020-10-03 NOTE — Telephone Encounter (Signed)
Patient is scheduled for procedure Wed 10/05/20 at 10:30. She will arrive at 8:30 AM. She has been scheduled for COVID test tomorrow 10/04/20 at 12:50 at [place]. She will also need a BMP and CBC drawn at [facility] office tomorrow morning before her COVID test.

## 2020-10-03 NOTE — Telephone Encounter (Signed)
Wellington Hampshire, MD  09/30/2020 5:28 PM EDT     Occluded left common iliac artery stent as expected. Schedule her for an abdominal aortogram with lower extremity runoff and possible endovascular intervention on Wednesday, November 10

## 2020-10-03 NOTE — Telephone Encounter (Signed)
Please call to discuss LEA that was performed last week. Patient states she was told she was going to have a "procedure for my blood clot" on this Wednesday, but has not heard anything.

## 2020-10-03 NOTE — Telephone Encounter (Addendum)
Patient calling back. She verbalized understanding of the pre-procedural instructions, lab work, and COVID test as listed below. Instructions sent to patient's MyChart as well.   You are scheduled for a Peripheral Angiogram on Wednesday, November 11 with Dr. [name].  1. Please arrive at the [facility] (Main Entrance A) at [facility]: [address] [place], [place] [address] at 8:30 AM (This time is two hours before your procedure to ensure your preparation). Free valet parking service is available.   Special note: Every effort is made to have your procedure done on time. Please understand that emergencies sometimes delay scheduled procedures.  2. Diet: Do not eat solid foods after midnight.  The patient may have clear liquids until 5am upon the day of the procedure.  3. Labs: You will need to have blood drawn on Tuesday, November 9 at [facility], Go to 1st desk on your right to register.  Address: [address] [place], [place] [address]  Open: 8am - 5pm  Phone: [phone]. You do not need to be fasting.  COVID PRE- TEST: You will need a COVID TEST prior to the procedure:  LOCATION: [place].  DATE/TIME:  Oct 04, 2020 sometime between 8 am and 1 pm.    4. Medication instructions in preparation for your procedure:   Contrast Allergy: No  On the morning of your procedure, take your aspirin and Plavix/Clopidogrel and any morning medicines NOT listed above.  You may use sips of water.  5. Plan for one night stay--bring personal belongings. 6. Bring a current list of your medications and current insurance cards. 7. You MUST have a responsible person to drive you home. 8. Someone MUST be with you the first 24 hours after you arrive home or your discharge will be delayed. 9. Please wear clothes that are easy to get on and off and wear slip-on shoes.  Thank you for allowing [place] to care for you!   -- [place]  Invasive Cardiovascular services

## 2020-10-04 ENCOUNTER — Other Ambulatory Visit: Payer: Self-pay

## 2020-10-04 ENCOUNTER — Telehealth: Payer: Self-pay | Admitting: *Deleted

## 2020-10-04 ENCOUNTER — Other Ambulatory Visit
Admission: RE | Admit: 2020-10-04 | Discharge: 2020-10-04 | Disposition: A | Discharge status: Home / Self Care | Payer: Medicare PPO | Source: Ambulatory Visit | Attending: Cardiovascular Disease | Admitting: Cardiovascular Disease

## 2020-10-04 ENCOUNTER — Other Ambulatory Visit
Admission: RE | Admit: 2020-10-04 | Discharge: 2020-10-04 | Disposition: A | Discharge status: Home / Self Care | Payer: Medicare PPO | Source: Home / Self Care | Attending: Cardiovascular Disease | Admitting: Cardiovascular Disease

## 2020-10-04 DIAGNOSIS — Z20822 Contact with and (suspected) exposure to covid-19: Secondary | ICD-10-CM | POA: Diagnosis not present

## 2020-10-04 DIAGNOSIS — I739 Peripheral vascular disease, unspecified: Secondary | ICD-10-CM | POA: Insufficient documentation

## 2020-10-04 DIAGNOSIS — Z01812 Encounter for preprocedural laboratory examination: Secondary | ICD-10-CM | POA: Diagnosis not present

## 2020-10-04 LAB — CBC
HCT: 49.1 % — ABNORMAL HIGH (ref 36.0–46.0)
Hemoglobin: 17.3 g/dL — ABNORMAL HIGH (ref 12.0–15.0)
MCH: 35.2 pg — ABNORMAL HIGH (ref 26.0–34.0)
MCHC: 35.2 g/dL (ref 30.0–36.0)
MCV: 100 fL (ref 80.0–100.0)
Platelets: 243 10*3/uL (ref 150–400)
RBC: 4.91 MIL/uL (ref 3.87–5.11)
RDW: 13.2 % (ref 11.5–15.5)
WBC: 10.5 10*3/uL (ref 4.0–10.5)
nRBC: 0 % (ref 0.0–0.2)

## 2020-10-04 LAB — BASIC METABOLIC PANEL
Anion gap: 11 (ref 5–15)
BUN: 16 mg/dL (ref 8–23)
CO2: 26 mmol/L (ref 22–32)
Calcium: 9.4 mg/dL (ref 8.9–10.3)
Chloride: 100 mmol/L (ref 98–111)
Creatinine, Ser: 0.75 mg/dL (ref 0.44–1.00)
GFR, Estimated: >60 mL/min (ref >60)
Glucose, Bld: 122 mg/dL — ABNORMAL HIGH (ref 70–99)
Potassium: 4.1 mmol/L (ref 3.5–5.1)
Sodium: 137 mmol/L (ref 135–145)

## 2020-10-04 LAB — SARS CORONAVIRUS 2 (TAT 6-24 HRS): SARS Coronavirus 2: NEGATIVE

## 2020-10-04 NOTE — Telephone Encounter (Addendum)
Pt contacted pre-abdominal aortogram scheduled at [facility] for: Wednesday October 05, 2020 10:30 AM Verified arrival time and place: [place] [facility]) at: 8:30 AM   No solid food after midnight prior to cath, clear liquids until 5 AM day of procedure.  AM meds can be  taken pre-cath with sips of water including: ASA 81 mg Plavix 75 mg  Confirmed patient has responsible adult to drive home post procedure and be with patient first 24 hours after arriving home: yes  You are allowed ONE visitor in the waiting room during the time you are at the hospital for your procedure. Both you and your visitor must wear a mask once you enter the hospital.       COVID-19 Pre-Screening Questions:  . In the past 14 days have you had a new cough, new headache, new nasal congestion, fever (100.4 or greater) unexplained body aches, new sore throat, or sudden loss of taste or sense of smell? no . In the past 14 days have you been around anyone with known Covid 19? No   Reviewed procedure/mask/visitor instructions, COVID-19 questions reviewed with patient.

## 2020-10-05 ENCOUNTER — Encounter (HOSPITAL_COMMUNITY)
Admission: RE | Disposition: A | Discharge status: Home / Self Care | Payer: Self-pay | Source: Home / Self Care | Attending: Cardiovascular Disease

## 2020-10-05 ENCOUNTER — Observation Stay (HOSPITAL_COMMUNITY)
Admission: RE | Admit: 2020-10-05 | Discharge: 2020-10-06 | Disposition: A | Discharge status: Home / Self Care | Payer: Medicare PPO | Attending: Cardiovascular Disease | Admitting: Cardiovascular Disease

## 2020-10-05 DIAGNOSIS — Z79899 Other long term (current) drug therapy: Secondary | ICD-10-CM | POA: Diagnosis not present

## 2020-10-05 DIAGNOSIS — Z885 Allergy status to narcotic agent status: Secondary | ICD-10-CM | POA: Insufficient documentation

## 2020-10-05 DIAGNOSIS — Z85828 Personal history of other malignant neoplasm of skin: Secondary | ICD-10-CM | POA: Insufficient documentation

## 2020-10-05 DIAGNOSIS — F17200 Nicotine dependence, unspecified, uncomplicated: Secondary | ICD-10-CM | POA: Insufficient documentation

## 2020-10-05 DIAGNOSIS — I745 Embolism and thrombosis of iliac artery: Secondary | ICD-10-CM | POA: Insufficient documentation

## 2020-10-05 DIAGNOSIS — E039 Hypothyroidism, unspecified: Secondary | ICD-10-CM

## 2020-10-05 DIAGNOSIS — I739 Peripheral vascular disease, unspecified: Secondary | ICD-10-CM | POA: Diagnosis present

## 2020-10-05 DIAGNOSIS — Z87891 Personal history of nicotine dependence: Secondary | ICD-10-CM

## 2020-10-05 DIAGNOSIS — I779 Disorder of arteries and arterioles, unspecified: Secondary | ICD-10-CM | POA: Diagnosis not present

## 2020-10-05 DIAGNOSIS — Z79890 Hormone replacement therapy (postmenopausal): Secondary | ICD-10-CM | POA: Insufficient documentation

## 2020-10-05 DIAGNOSIS — Z8249 Family history of ischemic heart disease and other diseases of the circulatory system: Secondary | ICD-10-CM | POA: Insufficient documentation

## 2020-10-05 DIAGNOSIS — I70212 Atherosclerosis of native arteries of extremities with intermittent claudication, left leg: Secondary | ICD-10-CM

## 2020-10-05 DIAGNOSIS — Z7902 Long term (current) use of antithrombotics/antiplatelets: Secondary | ICD-10-CM | POA: Diagnosis not present

## 2020-10-05 DIAGNOSIS — Z888 Allergy status to other drugs, medicaments and biological substances status: Secondary | ICD-10-CM | POA: Diagnosis not present

## 2020-10-05 DIAGNOSIS — E785 Hyperlipidemia, unspecified: Secondary | ICD-10-CM | POA: Diagnosis not present

## 2020-10-05 DIAGNOSIS — Z720 Tobacco use: Secondary | ICD-10-CM

## 2020-10-05 DIAGNOSIS — I2510 Atherosclerotic heart disease of native coronary artery without angina pectoris: Secondary | ICD-10-CM | POA: Insufficient documentation

## 2020-10-05 DIAGNOSIS — Z95828 Presence of other vascular implants and grafts: Secondary | ICD-10-CM | POA: Diagnosis not present

## 2020-10-05 DIAGNOSIS — R42 Dizziness and giddiness: Secondary | ICD-10-CM

## 2020-10-05 HISTORY — PX: PERIPHERAL VASCULAR INTERVENTION: CATH118257

## 2020-10-05 HISTORY — PX: PERIPHERAL VASCULAR THROMBECTOMY: CATH118306

## 2020-10-05 HISTORY — PX: ABDOMINAL AORTOGRAM W/LOWER EXTREMITY: CATH118223

## 2020-10-05 LAB — POCT ACTIVATED CLOTTING TIME
Activated Clotting Time: 285 seconds
Activated Clotting Time: 263 seconds
Activated Clotting Time: 241 seconds
Activated Clotting Time: 274 seconds
Activated Clotting Time: 197 seconds
Activated Clotting Time: 164 seconds
Activated Clotting Time: 147 seconds

## 2020-10-05 SURGERY — ABDOMINAL AORTOGRAM W/LOWER EXTREMITY
Anesthesia: LOCAL

## 2020-10-05 MED ORDER — ESCITALOPRAM OXALATE 10 MG PO TABS
10.0000 mg | ORAL_TABLET | Freq: Every day | ORAL | Status: DC
  Administered 2020-10-05: 10 mg via ORAL
  Filled 2020-10-05: qty 1

## 2020-10-05 MED ORDER — LABETALOL HCL 5 MG/ML IV SOLN: 10.0000 mg | INTRAVENOUS | Status: DC | PRN

## 2020-10-05 MED ORDER — ASPIRIN EC 81 MG PO TBEC
81.0000 mg | DELAYED_RELEASE_TABLET | Freq: Every day | ORAL | Status: DC
  Administered 2020-10-06: 81 mg via ORAL
  Filled 2020-10-05: qty 1

## 2020-10-05 MED ORDER — ROSUVASTATIN CALCIUM 20 MG PO TABS
20.0000 mg | ORAL_TABLET | Freq: Every day | ORAL | Status: DC
  Administered 2020-10-05 – 2020-10-06 (×2): 20 mg via ORAL
  Filled 2020-10-05 (×2): qty 1

## 2020-10-05 MED ORDER — ASPIRIN 81 MG PO CHEW: 81.0000 mg | CHEWABLE_TABLET | ORAL | Status: DC

## 2020-10-05 MED ORDER — LIDOCAINE HCL (PF) 1 % IJ SOLN
INTRAMUSCULAR | Status: AC
  Filled 2020-10-05: qty 30

## 2020-10-05 MED ORDER — TIROFIBAN HCL IN NACL 5-0.9 MG/100ML-% IV SOLN
INTRAVENOUS | Status: AC | PRN
  Administered 2020-10-05: 0.075 ug/kg/min via INTRAVENOUS

## 2020-10-05 MED ORDER — PROPRANOLOL HCL 10 MG PO TABS
10.0000 mg | ORAL_TABLET | Freq: Three times a day (TID) | ORAL | Status: DC | PRN
  Administered 2020-10-06: 10 mg via ORAL
  Filled 2020-10-05 (×3): qty 1

## 2020-10-05 MED ORDER — MIDAZOLAM HCL 2 MG/2ML IJ SOLN
INTRAMUSCULAR | Status: AC
  Filled 2020-10-05: qty 2

## 2020-10-05 MED ORDER — TIROFIBAN HCL IV 12.5 MG/250 ML
0.0750 ug/kg/min | INTRAVENOUS | Status: AC
  Administered 2020-10-05 – 2020-10-06 (×2): 0.075 ug/kg/min via INTRAVENOUS
  Filled 2020-10-05: qty 250

## 2020-10-05 MED ORDER — HEPARIN SODIUM (PORCINE) 1000 UNIT/ML IJ SOLN
INTRAMUSCULAR | Status: DC | PRN
  Administered 2020-10-05 (×2): 2000 [IU] via INTRAVENOUS
  Administered 2020-10-05: 5000 [IU] via INTRAVENOUS

## 2020-10-05 MED ORDER — FENTANYL CITRATE (PF) 100 MCG/2ML IJ SOLN
INTRAMUSCULAR | Status: AC
  Filled 2020-10-05: qty 2

## 2020-10-05 MED ORDER — NITROGLYCERIN 1 MG/10 ML FOR IR/CATH LAB
INTRA_ARTERIAL | Status: DC | PRN
  Administered 2020-10-05: 200 ug via INTRA_ARTERIAL

## 2020-10-05 MED ORDER — HEPARIN (PORCINE) IN NACL 1000-0.9 UT/500ML-% IV SOLN
INTRAVENOUS | Status: AC
  Filled 2020-10-05: qty 1000

## 2020-10-05 MED ORDER — HEPARIN (PORCINE) IN NACL 1000-0.9 UT/500ML-% IV SOLN
INTRAVENOUS | Status: DC | PRN
  Administered 2020-10-05 (×2): 500 mL

## 2020-10-05 MED ORDER — TIROFIBAN HCL IN NACL 5-0.9 MG/100ML-% IV SOLN
INTRAVENOUS | Status: AC
  Filled 2020-10-05: qty 100

## 2020-10-05 MED ORDER — CLOPIDOGREL BISULFATE 75 MG PO TABS
75.0000 mg | ORAL_TABLET | Freq: Every day | ORAL | Status: DC
  Administered 2020-10-06: 75 mg via ORAL
  Filled 2020-10-05: qty 1

## 2020-10-05 MED ORDER — ONDANSETRON HCL 4 MG/2ML IJ SOLN: 4.0000 mg | Freq: Four times a day (QID) | INTRAMUSCULAR | Status: DC | PRN

## 2020-10-05 MED ORDER — SODIUM CHLORIDE 0.9 % IV SOLN: 250.0000 mL | INTRAVENOUS | Status: DC | PRN

## 2020-10-05 MED ORDER — SODIUM CHLORIDE 0.9% FLUSH
3.0000 mL | Freq: Two times a day (BID) | INTRAVENOUS | Status: DC
  Administered 2020-10-05: 3 mL via INTRAVENOUS

## 2020-10-05 MED ORDER — SODIUM CHLORIDE 0.9% FLUSH: 3.0000 mL | INTRAVENOUS | Status: DC | PRN

## 2020-10-05 MED ORDER — MIDAZOLAM HCL 2 MG/2ML IJ SOLN
INTRAMUSCULAR | Status: DC | PRN
  Administered 2020-10-05 (×5): 1 mg via INTRAVENOUS

## 2020-10-05 MED ORDER — HEPARIN SODIUM (PORCINE) 1000 UNIT/ML IJ SOLN
INTRAMUSCULAR | Status: AC
  Filled 2020-10-05: qty 1

## 2020-10-05 MED ORDER — FENTANYL CITRATE (PF) 100 MCG/2ML IJ SOLN
INTRAMUSCULAR | Status: DC | PRN
  Administered 2020-10-05 (×2): 50 ug via INTRAVENOUS
  Administered 2020-10-05 (×2): 25 ug via INTRAVENOUS

## 2020-10-05 MED ORDER — NITROGLYCERIN 1 MG/10 ML FOR IR/CATH LAB
INTRA_ARTERIAL | Status: AC
  Filled 2020-10-05: qty 10

## 2020-10-05 MED ORDER — SODIUM CHLORIDE 0.9 % IV SOLN: INTRAVENOUS | Status: AC

## 2020-10-05 MED ORDER — LEVOTHYROXINE SODIUM 100 MCG PO TABS
100.0000 ug | ORAL_TABLET | Freq: Every day | ORAL | Status: DC
  Administered 2020-10-06: 100 ug via ORAL
  Filled 2020-10-05: qty 1

## 2020-10-05 MED ORDER — SODIUM CHLORIDE 0.9 % WEIGHT BASED INFUSION
3.0000 mL/kg/h | INTRAVENOUS | Status: DC
  Administered 2020-10-05: 3 mL/kg/h via INTRAVENOUS

## 2020-10-05 MED ORDER — POLYVINYL ALCOHOL 1.4 % OP SOLN
1.0000 [drp] | Freq: Every day | OPHTHALMIC | Status: DC | PRN
  Filled 2020-10-05: qty 15

## 2020-10-05 MED ORDER — SODIUM CHLORIDE 0.9 % WEIGHT BASED INFUSION
1.0000 mL/kg/h | INTRAVENOUS | Status: DC
  Administered 2020-10-05: 250 mL via INTRAVENOUS

## 2020-10-05 MED ORDER — SODIUM CHLORIDE 0.9% FLUSH: 3.0000 mL | Freq: Two times a day (BID) | INTRAVENOUS | Status: DC

## 2020-10-05 MED ORDER — COLOSTRUM 500 MG PO CAPS: 500.0000 mg | ORAL_CAPSULE | Freq: Every day | ORAL | Status: DC

## 2020-10-05 MED ORDER — LIDOCAINE HCL (PF) 1 % IJ SOLN
INTRAMUSCULAR | Status: DC | PRN
  Administered 2020-10-05 (×2): 10 mL via SUBCUTANEOUS

## 2020-10-05 MED ORDER — ADULT MULTIVITAMIN W/MINERALS CH
1.0000 | ORAL_TABLET | Freq: Every day | ORAL | Status: DC
  Administered 2020-10-06: 1 via ORAL
  Filled 2020-10-05 (×2): qty 1

## 2020-10-05 SURGICAL SUPPLY — 29 items
BALLN MUSTANG 6.0X40 75 (BALLOONS) ×3
BALLN MUSTANG 6X60X135 (BALLOONS) ×6
BALLN MUSTANG 8X60X75 (BALLOONS) ×3
BALLOON MUSTANG 6.0X40 75 (BALLOONS) IMPLANT
BALLOON MUSTANG 6X60X135 (BALLOONS) IMPLANT
BALLOON MUSTANG 8X60X75 (BALLOONS) IMPLANT
CANISTER PENUMBRA ENGINE (MISCELLANEOUS) ×1 IMPLANT
CATH ANGIO 5F PIGTAIL 65CM (CATHETERS) ×1 IMPLANT
CATH CROSS OVER TEMPO 5F (CATHETERS) ×1 IMPLANT
CATH LIGHTNING 7 XTORQ 130 (CATHETERS) ×1 IMPLANT
CATH STRAIGHT 5FR 65CM (CATHETERS) ×1 IMPLANT
DEVICE CLOSURE MYNXGRIP 6/7F (Vascular Products) ×1 IMPLANT
KIT ENCORE 26 ADVANTAGE (KITS) ×1 IMPLANT
KIT MICROPUNCTURE NIT STIFF (SHEATH) ×1 IMPLANT
KIT PV (KITS) ×3 IMPLANT
SHEATH BRITE TIP 7FR 35CM (SHEATH) ×2 IMPLANT
SHEATH PINNACLE 5F 10CM (SHEATH) ×1 IMPLANT
SHEATH PINNACLE 7F 10CM (SHEATH) ×1 IMPLANT
SHEATH PINNACLE MP 7F 45CM (SHEATH) ×1 IMPLANT
SHEATH PROBE COVER 6X72 (BAG) ×1 IMPLANT
STENT INNOVA 6X60X130 (Permanent Stent) ×1 IMPLANT
SYR MEDRAD MARK 7 150ML (SYRINGE) ×3 IMPLANT
TAPE VIPERTRACK RADIOPAQ (MISCELLANEOUS) IMPLANT
TAPE VIPERTRACK RADIOPAQUE (MISCELLANEOUS) ×3
TRANSDUCER W/STOPCOCK (MISCELLANEOUS) ×3 IMPLANT
TRAY PV CATH (CUSTOM PROCEDURE TRAY) ×3 IMPLANT
WIRE BENTSON .035X145CM (WIRE) ×1 IMPLANT
WIRE HI TORQ VERSACORE J 260CM (WIRE) ×1 IMPLANT
WIRE SPARTACORE .014X300CM (WIRE) ×1 IMPLANT

## 2020-10-05 NOTE — Interval H&P Note (Signed)
History and Physical Interval Note:  10/05/2020 10:11 AM  Tracie Morris  has presented today for surgery, with the diagnosis of claudication.  The various methods of treatment have been discussed with the patient and family. After consideration of risks, benefits and other options for treatment, the patient has consented to  Procedure(s): ABDOMINAL AORTOGRAM W/LOWER EXTREMITY (N/A) as a surgical intervention.  The patient's history has been reviewed, patient examined, no change in status, stable for surgery.  I have reviewed the patient's chart and labs.  Questions were answered to the patient's satisfaction.     [name]

## 2020-10-05 NOTE — Progress Notes (Signed)
ANTICOAGULATION CONSULT NOTE - Initial Consult  Pharmacy Consult for tirofiban Indication: PAD  Allergies  Allergen Reactions  . Atorvastatin Other (See Comments)    Myalgias   . Darvocet [Propoxyphene N-Acetaminophen] Nausea And Vomiting  . Demerol Nausea And Vomiting  . Methocarbamol Other (See Comments)    UNKNOWN  . Mirtazapine     Sleeplessness, nausea   . Primidone     Severe insomnia, myoclonus and parathesias   . Ambien [Zolpidem Tartrate] Palpitations    Anxiety , trembling.    Patient Measurements: Height: 5' 7.5" (171.5 cm) Weight: 47.6 kg (105 lb) IBW/kg (Calculated) : 62.75  Vital Signs: Temp: 98.3 F (36.8 C) (11/10 0846) Temp Source: Oral (11/10 0846) BP: 136/83 (11/10 1307) Pulse Rate: 82 (11/10 1307)  Labs: Recent Labs    10/04/20 1117  HGB 17.3*  HCT 49.1*  PLT 243  CREATININE 0.75    Estimated Creatinine Clearance: 52.7 mL/min (by C-G formula based on SCr of 0.75 mg/dL).   Medical History: Past Medical History:  Diagnosis Date  . Arthritis   . Basal cell carcinoma 03/29/2014   L lower abdomen  . Basal cell carcinoma 01/18/2020   central nasal tip  . Breast mass 09/05/2020   right breast mass 10 oclock  . Carotid artery occlusion   . Hemochromatosis   . Hyperlipidemia   . Hypocalcemia   . hypothyroidism 2007   acquired, secondary to surgery for thyroid Ca  . Hypothyroidism   . Migraine    improved since thyroid surgery, 2 years  . multinodular goiter 2007   s/p thyroidectomy  . 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 Repeat angiography in November of 2014 showed 50% distal aortic stenosis extending into the left common iliac artery which had 60-70% ostial stenosis. No other significant disease. I placed on balloon expandable stent into  left common iliac artery extending slightly into the distal aorta  . PONV (postoperative  nausea and vomiting)   . Squamous cell carcinoma of skin 06/23/2018   R nasal tip/in situ    Assessment: 64 year old female s/p abdominal aortogram found to have subacute stent thrombosis of L iliac artery with large amount of thrombus. Now s/p thrombectomy and angioplasty. New orders received to run tirofiban for 18 hours post cath. Calculated creatinine clearance below 67ml/min so will dose adjust. Will check cbc in am.   Goal of Therapy:  Monitor platelets by anticoagulation protocol: Yes   Plan:  tirofiban 0.058mcg/kg/min x 18 hours CBC in am  Erin Hearing PharmD., BCPS Clinical Pharmacist 10/05/2020 3:10 PM

## 2020-10-06 ENCOUNTER — Observation Stay (HOSPITAL_BASED_OUTPATIENT_CLINIC_OR_DEPARTMENT_OTHER): Payer: Medicare PPO

## 2020-10-06 ENCOUNTER — Other Ambulatory Visit: Payer: Self-pay | Admitting: Student

## 2020-10-06 ENCOUNTER — Encounter (HOSPITAL_COMMUNITY): Payer: Self-pay | Admitting: Cardiovascular Disease

## 2020-10-06 DIAGNOSIS — Z87891 Personal history of nicotine dependence: Secondary | ICD-10-CM

## 2020-10-06 DIAGNOSIS — I739 Peripheral vascular disease, unspecified: Secondary | ICD-10-CM

## 2020-10-06 DIAGNOSIS — E039 Hypothyroidism, unspecified: Secondary | ICD-10-CM

## 2020-10-06 DIAGNOSIS — F17200 Nicotine dependence, unspecified, uncomplicated: Secondary | ICD-10-CM | POA: Diagnosis not present

## 2020-10-06 DIAGNOSIS — Z720 Tobacco use: Secondary | ICD-10-CM

## 2020-10-06 DIAGNOSIS — Z7902 Long term (current) use of antithrombotics/antiplatelets: Secondary | ICD-10-CM | POA: Diagnosis not present

## 2020-10-06 DIAGNOSIS — I779 Disorder of arteries and arterioles, unspecified: Secondary | ICD-10-CM | POA: Diagnosis not present

## 2020-10-06 DIAGNOSIS — I2510 Atherosclerotic heart disease of native coronary artery without angina pectoris: Secondary | ICD-10-CM | POA: Diagnosis not present

## 2020-10-06 DIAGNOSIS — E785 Hyperlipidemia, unspecified: Secondary | ICD-10-CM | POA: Diagnosis not present

## 2020-10-06 DIAGNOSIS — R42 Dizziness and giddiness: Secondary | ICD-10-CM

## 2020-10-06 DIAGNOSIS — I745 Embolism and thrombosis of iliac artery: Secondary | ICD-10-CM | POA: Diagnosis not present

## 2020-10-06 DIAGNOSIS — I70212 Atherosclerosis of native arteries of extremities with intermittent claudication, left leg: Secondary | ICD-10-CM | POA: Diagnosis not present

## 2020-10-06 LAB — CBC
HCT: 36.5 % (ref 36.0–46.0)
Hemoglobin: 12.3 g/dL (ref 12.0–15.0)
MCH: 34.6 pg — ABNORMAL HIGH (ref 26.0–34.0)
MCHC: 33.7 g/dL (ref 30.0–36.0)
MCV: 102.8 fL — ABNORMAL HIGH (ref 80.0–100.0)
Platelets: 174 10*3/uL (ref 150–400)
RBC: 3.55 MIL/uL — ABNORMAL LOW (ref 3.87–5.11)
RDW: 13.5 % (ref 11.5–15.5)
WBC: 9.2 10*3/uL (ref 4.0–10.5)
nRBC: 0 % (ref 0.0–0.2)

## 2020-10-06 LAB — BASIC METABOLIC PANEL
Anion gap: 7 (ref 5–15)
BUN: 7 mg/dL — ABNORMAL LOW (ref 8–23)
CO2: 26 mmol/L (ref 22–32)
Calcium: 8.6 mg/dL — ABNORMAL LOW (ref 8.9–10.3)
Chloride: 109 mmol/L (ref 98–111)
Creatinine, Ser: 0.63 mg/dL (ref 0.44–1.00)
GFR, Estimated: >60 mL/min (ref >60)
Glucose, Bld: 104 mg/dL — ABNORMAL HIGH (ref 70–99)
Potassium: 4 mmol/L (ref 3.5–5.1)
Sodium: 142 mmol/L (ref 135–145)

## 2020-10-06 MED ORDER — VARENICLINE TARTRATE 1 MG PO TABS: 1.0000 mg | ORAL_TABLET | Freq: Two times a day (BID) | ORAL | 1 refills | Status: DC

## 2020-10-06 MED ORDER — CHANTIX STARTING MONTH PAK 0.5 MG X 11 & 1 MG X 42 PO TABS: ORAL_TABLET | ORAL | 0 refills | Status: DC

## 2020-10-06 MED ORDER — ROSUVASTATIN CALCIUM 40 MG PO TABS: 20.0000 mg | ORAL_TABLET | Freq: Every day | ORAL | 2 refills | Status: DC

## 2020-10-06 NOTE — Progress Notes (Signed)
Orthostatic vitals obtained and documented below. Patient able to ambulate about 300 feet independently in hallway with steady gait. Patient states that she is not experiencing any dizziness but that she does feel weak.   10/06/20 1014  Vitals  Patient Position (if appropriate) Orthostatic Vitals  Orthostatic Lying   BP- Lying 145/74  Pulse- Lying 70  Orthostatic Sitting  BP- Sitting 129/64  Pulse- Sitting 73  Orthostatic Standing at 0 minutes  BP- Standing at 0 minutes 127/65  Pulse- Standing at 0 minutes 80  Orthostatic Standing at 3 minutes  BP- Standing at 3 minutes 138/67  Pulse- Standing at 3 minutes 78

## 2020-10-06 NOTE — Progress Notes (Signed)
Progress Note  Patient Name: Tracie Morris Date of Encounter: 10/06/2020  [facility] Cardiologist: [name], MD   Subjective   No acute overnight events. She notes some lightheadedness this morning when standing and walking to the bathroom. No chest pain or shortness of breath. Left femoral cath site very tender with light palpation. Mynx closure device was placed on this side. Slightly firm above cath site.  Inpatient Medications    Scheduled Meds: . aspirin EC  81 mg Oral Daily  . clopidogrel  75 mg Oral Daily  . escitalopram  10 mg Oral QHS  . levothyroxine  100 mcg Oral QAC breakfast  . multivitamin with minerals  1 tablet Oral Daily  . rosuvastatin  20 mg Oral Daily  . sodium chloride flush  3 mL Intravenous Q12H   Continuous Infusions: . sodium chloride     PRN Meds: sodium chloride, labetalol, ondansetron (ZOFRAN) IV, polyvinyl alcohol, propranolol, sodium chloride flush   Vital Signs    Vitals:   10/05/20 2001 10/05/20 2333 10/06/20 0442 10/06/20 0632  BP: 104/70 105/66 110/80 (!) 127/59  Pulse:   72 64  Resp:   16   Temp: 98.7 F (37.1 C) 97.8 F (36.6 C) 98.7 F (37.1 C)   TempSrc: Oral Oral Oral   SpO2:   96%   Weight:   47.5 kg   Height:       No intake or output data in the 24 hours ending 10/06/20 0938 Last 3 Weights 10/06/2020 10/05/2020 09/27/2020  Weight (lbs) 104 lb 11.5 oz 105 lb 107 lb  Weight (kg) 47.5 kg 47.628 kg 48.535 kg      Telemetry    Normal sinus rhythm with rates in the 60's to 80's. Occasional PVC. - Personally Reviewed  ECG    No ECG tracing from this admission. - Personally Reviewed  Physical Exam   GEN: No acute distress.   Neck: Supple. Cardiac: RRR. No murmurs, rubs, or gallops. Left femoral cath site very tender with light palpation. Slightly firm above cath site. Mynx closure device placed on this site. Right femoral cath site soft with no signs of hematoma. Dorsal pedal pulses present  bilaterally. Respiratory: Clear to auscultation bilaterally.  GI: Soft, non-tender, non-distended  MS: No lower extremity edema. No deformity. Skin: Warm and dry. Neuro:  No focal deficits. Psych: Normal affect.   Labs    High Sensitivity Troponin:  No results for input(s): TROPONINIHS in the last 720 hours.    Chemistry Recent Labs  Lab 10/04/20 1117 10/06/20 0349  NA 137 142  K 4.1 4.0  CL 100 109  CO2 26 26  GLUCOSE 122* 104*  BUN 16 7*  CREATININE 0.75 0.63  CALCIUM 9.4 8.6*  GFRNONAA >60 >60  ANIONGAP 11 7     Hematology Recent Labs  Lab 10/04/20 1117 10/06/20 0349  WBC 10.5 9.2  RBC 4.91 3.55*  HGB 17.3* 12.3  HCT 49.1* 36.5  MCV 100.0 102.8*  MCH 35.2* 34.6*  MCHC 35.2 33.7  RDW 13.2 13.5  PLT 243 174    BNPNo results for input(s): BNP, PROBNP in the last 168 hours.   DDimer No results for input(s): DDIMER in the last 168 hours.   Radiology    PERIPHERAL VASCULAR CATHETERIZATION  Result Date: 10/05/2020 1.  Subacute stent thrombosis of the left common iliac artery with large amount of thrombus.  Likely an occluded dorsalis pedis on the left. 2.  Successful mechanical thrombectomy and balloon  angioplasty of the left common iliac artery.  This was complicated by embolization into the internal iliac artery which necessitated treatment with mechanical thrombectomy.  In addition, there was embolization into the left mid SFA which was also treated with mechanical thrombectomy and self-expanding stent placement.  Sluggish flow distally likely due to microembolization. 3.  Very long and complicated case which required large amount of contrast as well as fluoroscopy. Recommendations:: Continue dual antiplatelet therapy indefinitely. We will run Aggrastat for 18 hours to treat microembolization. Recommend treatment of underlying hemochromatosis which is likely leading to hypercoagulable state. Smoking cessation is strongly advised.   Cardiac Studies    Peripheral Angiogram 10/05/2020: 1.  Subacute stent thrombosis of the left common iliac artery with large amount of thrombus.  Likely an occluded dorsalis pedis on the left. 2.  Successful mechanical thrombectomy and balloon angioplasty of the left common iliac artery.  This was complicated by embolization into the internal iliac artery which necessitated treatment with mechanical thrombectomy.  In addition, there was embolization into the left mid SFA which was also treated with mechanical thrombectomy and self-expanding stent placement.  Sluggish flow distally likely due to microembolization. 3.  Very long and complicated case which required large amount of contrast as well as fluoroscopy.  Recommendations: Continue dual antiplatelet therapy indefinitely. We will run Aggrastat for 18 hours to treat microembolization. Recommend treatment of underlying hemochromatosis which is likely leading to hypercoagulable state. Smoking cessation is strongly advised.  Patient Profile     64 y.o. female with a history of PAD s/p left common iliac artery stent in 09/2013 with subsequent covered stent placement in 2017 for in-situ thrombosis with subtotal occlusion, hereditary hemochromatosis, hyperlipidemia, hypothyroidism who presented for outpatient peripheral angiography with Dr. Fletcher Anon after abdominal aorta study showed interval occlusion of left common iliac artery stent.  Assessment & Plan    PAD Peripheral angiography showed subacute stent thrombosis of left common iliac artery with large amount of thrombus and likely occluded dorsalis pedis on the left. Patient underwent successful mechanical thrombectomy and balloon angioplasty of the left common iliac artery. This was complicated by embolization into the internal iliac artery which necessitated treatment with mechanical thrombectomy. There was also embolization in the left mid SFA which was also treated with mechanical thrombectomy and  self-expanding stent placement. Patient was admitted overnight and was treated with Aggrastat for 18 hours to treat microembolization. Continue indefinite DAPT with Aspirin and Plavix and Crestor 20mg  daily. Dr. Fletcher Anon recommended treatment for underlying hemochromatosis which is likely leading to hypercoagulable state. She follows with Dr. [name] for this.   Patient does have significant tenderness with slight palpation of left femoral cath site this morning. Hemoglobin down from 17.3 yesterday to 12.3 today. Maybe a dilutional component. No obvious bleeding. Will discuss with MD about whether any additional observation or imaging is needed.   Lightheadedness Patient notes lightheadedness with ambulating to the bathroom this morning. BP stable. Will check orthostatics. If normal, will have patient ambulate with RN.  For questions or updates, please contact [facility] Please consult [email or web address] for contact info under        Signed, Darreld Mclean, PA-C  10/06/2020, 9:38 AM

## 2020-10-06 NOTE — Telephone Encounter (Signed)
Orders placed for post PV procedure f/u testing. To be scheduled with in 2 weeks with a f/u appt with Dr. Fletcher Anon after. Message fwd to scheduling to contact the patient to schedule.

## 2020-10-06 NOTE — Progress Notes (Signed)
error 

## 2020-10-06 NOTE — Progress Notes (Signed)
Limited left lower extremity arterial duplex completed. Refer to "CV Proc" under chart review to view preliminary results.  10/06/2020 12:54 PM Kelby Aline., MHA, RVT, RDCS, RDMS

## 2020-10-06 NOTE — Discharge Summary (Signed)
Discharge Summary    Patient ID: Tracie Morris MRN: [id number]; DOB: [birth year]  Admit date: 10/05/2020 Discharge date: 10/06/2020  Primary Care Provider: [name], MD  Primary Cardiologist: [name], MD  Primary Electrophysiologist:  None   Discharge Diagnoses    Principal Problem:   PAD (peripheral artery disease) [facility]) Active Problems:   Hyperlipidemia   Hereditary hemochromatosis ([place])   Lightheadedness   Hypothyroidism   Tobacco abuse    Diagnostic Studies/Procedures    Peripheral Angiogram 10/05/2020: 1.  Subacute stent thrombosis of the left common iliac artery with large amount of thrombus.  Likely an occluded dorsalis pedis on the left. 2.  Successful mechanical thrombectomy and balloon angioplasty of the left common iliac artery.  This was complicated by embolization into the internal iliac artery which necessitated treatment with mechanical thrombectomy.  In addition, there was embolization into the left mid SFA which was also treated with mechanical thrombectomy and self-expanding stent placement.  Sluggish flow distally likely due to microembolization. 3.  Very long and complicated case which required large amount of contrast as well as fluoroscopy.  Recommendations:: Continue dual antiplatelet therapy indefinitely. We will run Aggrastat for 18 hours to treat microembolization. Recommend treatment of underlying hemochromatosis which is likely leading to hypercoagulable state. Smoking cessation is strongly advised. _____________  Lower Extremity Arterial Ultrasound 10/06/2020 (Preliminary Reading): Summary: Left: No evidence of pseudoaneurysm, AV fistula, or DVT involving the left  groin.    History of Present Illness     Tracie Morris is a 64 y.o. 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 Fletcher Anon.   Patient was recently seen by Dr. Fletcher Anon on 09/27/2020 at which time she noted that she recently had a colonoscopy and both Plavix and Colostrum were held before procedure. The day of the colonoscopy, she developed severe left leg claudication. The symptoms subsequently improved after a day but she continued to have significant discomfort with walking. No symptoms at rest. She states symptoms were very similar to the ones she had with her left iliac disease. She denied any chest pain or shortness of breath. Unfortunately, she did note that she continues to smoke.  Aortoiliac duplex and ABI were ordered for further evaluation and showed interval occlusion of the left common iliac stent. Therefore, outpatient peripheral angiogram was scheduled for 10/05/2020.  Hospital Course     Consultants: None  PAD Patient presented for outpatient peripheral angiography on 10/05/2020 as stated above. Angiogram showed subacute stent thrombosis of left common iliac artery with large amount of thrombus and likely occluded dorsalis pedis on the left. Patient underwent successful mechanical thrombectomy and balloon angioplasty of the left common iliac artery. This was complicated by embolization into the internal iliac artery which necessitated treatment with mechanical thrombectomy. There was also embolization in the left mid SFA which was also treated with mechanical thrombectomy and self-expanding stent placement. Patient was admitted overnight and was treated with Aggrastat for 18 hours to treat microembolization. Continue indefinite DAPT with Aspirin and Plavix. Will increase home Crestor to 40mg  daily given LDL of 133 in 02/2020. Will need repeat lipid panel and LFTs in 6-8 weeks. Dr. Fletcher Anon recommended treatment for underlying hemochromatosis which is likely leading to hypercoagulable state. She follows with Dr. [name] for  this.  Patient did have significant tenderness with  light palpation of left femoral cath site on morning of discharge. Hemoglobin down to 12.3 from 17.3 the day before. Lower extremity arterial ultrasound ordered to rule out pseudoaneurysm and was negative.  Lightheadedness Patient notes lightheadedness with ambulating to the bathroom this morning. BP stable. Orthostatics were OK. Suspect patient is a little dehydrated. Encouraged increase in PO intake. Patient able to ambulate in the hall without any dizziness but she did note some weakness. Patient states she feels okay to go home.  Tobacco Abuse Patient continues to smoke. Discussed importance of complete cessation. She states Chantix has been helpful in the past and request a new prescription of this. Will sent starting pack and continuing month pack.   Patient seen and examined by Dr. Audie Box today and determined to be stable for discharge. Repeat aortoiliac duplex and ABIs scheduled for 10/10/2020. Follow-up with Dr. Fletcher Anon scheduled for 10/27/2020. Medications as below.  Did the patient have an acute coronary syndrome (MI, NSTEMI, STEMI, etc) this admission?:  No                               Did the patient have a percutaneous coronary intervention (stent / angioplasty)?:  No.   _____________  Discharge Vitals Blood pressure (!) 127/59, pulse 64, temperature 98.7 F (37.1 C), temperature source Oral, resp. rate 16, height 5' 7.5" (1.715 m), weight 47.5 kg, SpO2 96 %.  Filed Weights   10/05/20 0846 10/06/20 0442  Weight: 47.6 kg 47.5 kg    Labs & Radiologic Studies    CBC Recent Labs    10/04/20 1117 10/06/20 0349  WBC 10.5 9.2  HGB 17.3* 12.3  HCT 49.1* 36.5  MCV 100.0 102.8*  PLT 243 062   Basic Metabolic Panel Recent Labs    10/04/20 1117 10/06/20 0349  NA 137 142  K 4.1 4.0  CL 100 109  CO2 26 26  GLUCOSE 122* 104*  BUN 16 7*  CREATININE 0.75 0.63  CALCIUM 9.4 8.6*   Liver Function Tests No results for  input(s): AST, ALT, ALKPHOS, BILITOT, PROT, ALBUMIN in the last 72 hours. No results for input(s): LIPASE, AMYLASE in the last 72 hours. High Sensitivity Troponin:   No results for input(s): TROPONINIHS in the last 720 hours.  BNP Invalid input(s): POCBNP D-Dimer No results for input(s): DDIMER in the last 72 hours. Hemoglobin A1C No results for input(s): HGBA1C in the last 72 hours. Fasting Lipid Panel No results for input(s): CHOL, HDL, LDLCALC, TRIG, CHOLHDL, LDLDIRECT in the last 72 hours. Thyroid Function Tests No results for input(s): TSH, T4TOTAL, T3FREE, THYROIDAB in the last 72 hours.  Invalid input(s): FREET3 _____________  PERIPHERAL VASCULAR CATHETERIZATION  Result Date: 10/05/2020 1.  Subacute stent thrombosis of the left common iliac artery with large amount of thrombus.  Likely an occluded dorsalis pedis on the left. 2.  Successful mechanical thrombectomy and balloon angioplasty of the left common iliac artery.  This was complicated by embolization into the internal iliac artery which necessitated treatment with mechanical thrombectomy.  In addition, there was embolization into the left mid SFA which was also treated with mechanical thrombectomy and self-expanding stent placement.  Sluggish flow distally likely due to microembolization. 3.  Very long and complicated case which required large amount of contrast as well as fluoroscopy. Recommendations:: Continue dual antiplatelet therapy indefinitely. We will run Aggrastat for 18 hours to treat microembolization. Recommend treatment of underlying hemochromatosis which is likely leading  to hypercoagulable state. Smoking cessation is strongly advised.  VAS [place] ABI WITH/WO TBI  Result Date: 09/30/2020 LOWER EXTREMITY DOPPLER STUDY Indications: Claudication, peripheral artery disease, and Poor left femoral              pulse, absent left pedal pulses. High Risk Factors: Hyperlipidemia, current smoker, coronary artery disease. Other  Factors: Patient c/o intermittent left leg numbness and pain                 Hemochromatosis.  Vascular Interventions: Left common iliac artery stenting in 2014 and 2017. Comparison Study: 02/22/20 normal ABI's Performing Technologist: Pilar Jarvis RDMS, RVT, RDCS  Examination Guidelines: A complete evaluation includes at minimum, Doppler waveform signals and systolic blood pressure reading at the level of bilateral brachial, anterior tibial, and posterior tibial arteries, when vessel segments are accessible. Bilateral testing is considered an integral part of a complete examination. Photoelectric Plethysmograph (PPG) waveforms and toe systolic pressure readings are included as required and additional duplex testing as needed. Limited examinations for reoccurring indications may be performed as noted.  ABI Findings: +--------+------------------+-----+---------+--------+ Right   Rt Pressure (mmHg)IndexWaveform Comment  +--------+------------------+-----+---------+--------+ [id number]                    triphasic         +--------+------------------+-----+---------+--------+ ATA     146               0.98 triphasic         +--------+------------------+-----+---------+--------+ PTA     146               0.98 triphasic         +--------+------------------+-----+---------+--------+ PERO    149               1.00 triphasic         +--------+------------------+-----+---------+--------+ +--------+------------------+-----+--------+--------------------------------+ Left    Lt Pressure (mmHg)IndexWaveformComment                          +--------+------------------+-----+--------+--------------------------------+ Brachial                               not obtained due to cuff failure +--------+------------------+-----+--------+--------------------------------+ ATA     94                0.63                                           +--------+------------------+-----+--------+--------------------------------+ PTA     76                0.51                                          +--------+------------------+-----+--------+--------------------------------+ PERO                                   unable to obtain signal          +--------+------------------+-----+--------+--------------------------------+ +-------+-----------+-----------+------------+------------+ ABI/TBIToday's ABIToday's TBIPrevious ABIPrevious TBI +-------+-----------+-----------+------------+------------+ Right  1.00                  1.27                     +-------+-----------+-----------+------------+------------+  Left   0.63                  1.21                     +-------+-----------+-----------+------------+------------+ Right ABI is decreased c/t 02/22/20 exam but, still normal Left ABI is significantly decreased c/t 02/22/20 exam  Summary: Right: Resting right ankle-brachial index is within normal range. No evidence of significant right lower extremity arterial disease. Left: Resting left ankle-brachial index indicates moderate left lower extremity arterial disease.  *See table(s) above for measurements and observations.  Vascular consult recommended. Electronically signed by Jenkins Rouge MD on 09/30/2020 at 9:01:42 AM.    Final    CT PANCREAS ABD W/WO  Result Date: 09/20/2020 CLINICAL DATA:  Chronic diarrhea for 2 months. Right upper quadrant abdominal tenderness. EXAM: CT ABDOMEN WITHOUT AND WITH CONTRAST TECHNIQUE: Multidetector CT imaging of the abdomen was performed following the standard protocol before and following the bolus administration of intravenous contrast. CONTRAST:  159mL OMNIPAQUE IOHEXOL 300 MG/ML  SOLN COMPARISON:  08/02/2015 PET-CT. 07/16/2015 CT abdomen/pelvis. 08/05/2020 abdominal sonogram. FINDINGS: Lower chest: No significant pulmonary nodules or acute consolidative airspace disease. Hepatobiliary:  Heterogeneous hepatic steatosis. Normal liver size with no definite liver surface irregularity. No liver mass. Normal gallbladder with no radiopaque cholelithiasis. No biliary ductal dilatation. CBD diameter 5 mm. Pancreas: Normal, with no mass or duct dilation. Spleen: Normal size. No mass. Adrenals/Urinary Tract: Stable adrenals without discrete adrenal nodules. No renal stones. No hydronephrosis. No renal masses. Stomach/Bowel: Postsurgical changes from Nissen fundoplication with no recurrent hiatal hernia. Stomach is otherwise normal and nondistended. Visualized small and large bowel is normal caliber, with no bowel wall thickening. Vascular/Lymphatic: Atherosclerotic nonaneurysmal abdominal aorta. Partially visualized left common iliac artery stent. Patent portal, splenic, hepatic and renal veins. Mild porta hepatis adenopathy up to 1.0 cm (series 9/image 26), stable since 2016 CT. No new pathologically enlarged lymph nodes in the abdomen. Other: No pneumoperitoneum, ascites or focal fluid collection. Musculoskeletal: No aggressive appearing focal osseous lesions. Minimal lumbar spondylosis. IMPRESSION: 1. No acute abnormality.  Normal pancreas. 2. Heterogeneous hepatic steatosis. 3. Chronic mild porta hepatis adenopathy, unchanged since 2016 CT, compatible with benign reactive etiology. 4. Aortic Atherosclerosis (ICD10-I70.0). Electronically Signed   By: Ilona Sorrel M.D.   On: 09/20/2020 13:27   VAS [place] LOWER EXTREMITY ARTERIAL DUPLEX  Result Date: 10/06/2020 LOWER EXTREMITY ARTERIAL DUPLEX STUDY Indications: S/p left femoral artery access, now with left groin pain. High Risk Factors: Hyperlipidemia, current smoker. Other Factors: PVD.  Current ABI: Not obtained Comparison Study: Aortoiliac duplex 09/29/2020 Performing Technologist: Maudry Mayhew MHA, RDMS, RVT, RDCS  Examination Guidelines: A complete evaluation includes B-mode imaging, spectral Doppler, color Doppler, and power Doppler as needed of  all accessible portions of each vessel. Bilateral testing is considered an integral part of a complete examination. Limited examinations for reoccurring indications may be performed as noted.  +----------+--------+-----+--------+----------+--------+ LEFT      PSV cm/sRatioStenosisWaveform  Comments +----------+--------+-----+--------+----------+--------+ EIA Distal40                   monophasic         +----------+--------+-----+--------+----------+--------+ CFA Distal49                   monophasic         +----------+--------+-----+--------+----------+--------+ DFA       31  monophasic         +----------+--------+-----+--------+----------+--------+ SFA Prox  50                   monophasic         +----------+--------+-----+--------+----------+--------+ Common femoral vein is patent.  Summary: Left: No evidence of pseudoaneurysm, AV fistula, or DVT involving the left groin.  See table(s) above for measurements and observations.    Preliminary    VAS US AORTA/IVC/ILIACS  Result Date: 10/03/2020 ABDOMINAL AORTA STUDY Indications: Surgery date 2014 left common iliac artery stent and restenting of              the same vessel in 2017. Left leg pain and numbness, poor left              femoral pulse and absent left pedal pulses Risk Factors: Hyperlipidemia, current smoker, coronary artery disease. Other Factors: Hemochromatosis. Vascular Interventions: 10/2016 Left common iliac artery PTA and stent for                         in-situ thrombus.                         Initial left common iliac artery stent in 2014.  Comparison Study: 02/22/20 patient left common iliac artery stent and otherwise                   normal exam Performing Technologist: Pilar Jarvis RDMS, RVT, RDCS Supporting Technologist: [facility] BS, RVT, RDCS  Examination Guidelines: A complete evaluation includes B-mode imaging, spectral Doppler, color Doppler, and power Doppler as needed of  all accessible portions of each vessel. Bilateral testing is considered an integral part of a complete examination. Limited examinations for reoccurring indications may be performed as noted.  Abdominal Aorta Findings: +-------------+-------+----------+----------+--------------+--------+--------+ Location     AP (cm)Trans (cm)PSV (cm/s)Waveform      ThrombusComments +-------------+-------+----------+----------+--------------+--------+--------+ Proximal                      90        biphasic                       +-------------+-------+----------+----------+--------------+--------+--------+ Distal                        145       biphasic                       +-------------+-------+----------+----------+--------------+--------+--------+ RT CIA Prox                   202       biphasic                       +-------------+-------+----------+----------+--------------+--------+--------+ RT CIA Distal                 143       biphasic                       +-------------+-------+----------+----------+--------------+--------+--------+ RT EIA Prox                   166       biphasic                       +-------------+-------+----------+----------+--------------+--------+--------+  RT EIA Distal                 158       biphasic                       +-------------+-------+----------+----------+--------------+--------+--------+ LT CIA Prox                             occluded stent                 +-------------+-------+----------+----------+--------------+--------+--------+ LT CIA Mid                              occluded stent                 +-------------+-------+----------+----------+--------------+--------+--------+ LT CIA Distal                           occluded stent                 +-------------+-------+----------+----------+--------------+--------+--------+ LT EIA Prox                   43        monophasic                      +-------------+-------+----------+----------+--------------+--------+--------+ LT EIA Mid                    47        monophasic                     +-------------+-------+----------+----------+--------------+--------+--------+ LT EIA Distal                 85        monophasic                     +-------------+-------+----------+----------+--------------+--------+--------+ The left internal iliac artery demonstrates retrograde flow and appears to feed into the proximal external iliac artery, just beyond the occluded common iliac artery stent. IVC/Iliac Findings: +-------+------+--------+--------+   IVC  PatentThrombusComments +-------+------+--------+--------+ IVC Midpatent                 +-------+------+--------+--------+    Left Stent(s): +-------------------+--------+--------+--------+--------+ Common iliac arteryPSV cm/sStenosisWaveformComments +-------------------+--------+--------+--------+--------+ Proximal Stent             occluded                 +-------------------+--------+--------+--------+--------+ Mid Stent                  occluded                 +-------------------+--------+--------+--------+--------+ Distal Stent               occluded                 +-------------------+--------+--------+--------+--------+    Summary: Abdominal Aorta: No evidence of an abdominal aortic aneurysm was visualized. Stenosis: +-----------------+--------+ Location         Stent    +-----------------+--------+ Left Common Iliacoccluded +-----------------+--------+ Interval occlusion of the left common iliac artery stent. Retrograde internal iliac artery flow feeds the left external iliac artery.  *See table(s) above for measurements and observations. Vascular consult recommended.  Electronically signed by Jenkins Rouge MD on 10/03/2020 at 7:33:22 AM.  Final    Disposition   Patient is being discharged home today in good condition.  Follow-up  Plans & Appointments     Follow-up Information    Wellington Hampshire, MD Follow up.   Specialty: Cardiology Why: Follow-up scheduled for 10/27/2020 at 11:40am. Please arrive 15 minutes early for check-in. If this date/time does not work for you, please call our office to reschedule. Contact information: [place] [address] [phone]        [facility] Follow up.   Specialty: Cardiology Why: Appointment for repeat ultrasound/ABI of lower extremities scheduled for 10/10/2020 at 9:30am. Please arrive 15 minutes early for check-in. Contact information: [address], [place] [phone]             Discharge Instructions    Diet - low sodium heart healthy   Complete by: As directed    Increase activity slowly   Complete by: As directed       Discharge Medications   Allergies as of 10/06/2020      Reactions   Atorvastatin Other (See Comments)   Myalgias   Darvocet [propoxyphene N-acetaminophen] Nausea And Vomiting   Demerol Nausea And Vomiting   Methocarbamol Other (See Comments)   UNKNOWN   Mirtazapine    Sleeplessness, nausea    Primidone    Severe insomnia, myoclonus and parathesias    Ambien [zolpidem Tartrate] Palpitations   Anxiety , trembling.      Medication List    TAKE these medications   aspirin EC 81 MG tablet Take 1 tablet (81 mg total) by mouth daily. Swallow whole.   Chantix Starting Month Pak 0.5 MG X 11 & 1 MG X 42 tablet Generic drug: varenicline Take one 0.5 mg tablet by mouth once daily for 3 days, then increase to one 0.5 mg tablet twice daily for 4 days, then increase to one 1 mg tablet twice daily.   varenicline 1 MG tablet Commonly known as: Chantix Continuing Month Pak Take 1 tablet (1 mg total) by mouth 2 (two) times daily.   clopidogrel 75 MG tablet Commonly known as: PLAVIX TAKE 1 TABLET BY MOUTH ONCE DAILY What changed:   how much  to take  how to take this  when to take this   Colostrum 500 MG Caps Take 500 mg by mouth daily.   Dry Eye Relief Drops 0.2-0.2-1 % Soln Generic drug: Glycerin-Hypromellose-PEG 400 Place 1 drop into both eyes daily as needed (Dry eye).   escitalopram 10 MG tablet Commonly known as: Lexapro Take 1 tablet (10 mg total) by mouth daily. What changed: when to take this   levothyroxine 100 MCG tablet Commonly known as: SYNTHROID Take 1 tablet (100 mcg total) by mouth daily before breakfast.   multivitamin with minerals Tabs tablet Take 1 tablet by mouth daily.   propranolol 10 MG tablet Commonly known as: INDERAL TAKE 1 TABLET BY MOUTH 3 TIMES DAILY AS NEEDED FOR TREMOR What changed: See the new instructions.   rosuvastatin 40 MG tablet Commonly known as: CRESTOR Take 0.5 tablets (20 mg total) by mouth daily. What changed: medication strength          Outstanding Labs/Studies   Will need repeat lipid panel and LFTs in 6-8 weeks after increasing statin.  Duration of Discharge Encounter   Greater than 30 minutes including physician time.  Signed, Darreld Mclean, PA-C 10/06/2020, 1:49 PM

## 2020-10-06 NOTE — Discharge Instructions (Signed)
Groin Site Care: Refer to this sheet in the next few weeks. These instructions provide you with information on caring for yourself after your procedure. Your caregiver may also give you more specific instructions. Your treatment has been planned according to current medical practices, but problems sometimes occur. Call your caregiver if you have any problems or questions after your procedure. HOME CARE INSTRUCTIONS  You may shower 24 hours after the procedure. Remove the bandage (dressing) and gently wash the site with plain soap and water. Gently pat the site dry.   Do not apply powder or lotion to the site.   Do not sit in a bathtub, swimming pool, or whirlpool for 5 to 7 days.   No bending, squatting, or lifting anything over 10 pounds (4.5 kg) as directed by your caregiver.   Inspect the site at least twice daily.   Do not drive home if you are discharged the same day of the procedure. Have someone else drive you.  What to expect:  Any bruising will usually fade within 1 to 2 weeks.   Blood that collects in the tissue (hematoma) may be painful to the touch. It should usually decrease in size and tenderness within 1 to 2 weeks.  SEEK IMMEDIATE MEDICAL CARE IF:  You have unusual pain at the groin site or down the affected leg.   You have redness, warmth, swelling, or pain at the groin site.   You have drainage (other than a small amount of blood on the dressing).   You have chills.   You have a fever or persistent symptoms for more than 72 hours.   You have a fever and your symptoms suddenly get worse.   Your leg becomes pale, cool, tingly, or numb.   You have heavy bleeding from the site. Hold pressure on the site.

## 2020-10-06 NOTE — Addendum Note (Signed)
Addended by: Lamar Laundry on: 10/06/2020 10:07 AM   Modules accepted: Orders

## 2020-10-06 NOTE — Telephone Encounter (Signed)
Scheduled pending call back from patient to confirm

## 2020-10-10 ENCOUNTER — Other Ambulatory Visit: Payer: Self-pay | Admitting: Cardiovascular Disease

## 2020-10-10 ENCOUNTER — Ambulatory Visit (INDEPENDENT_AMBULATORY_CARE_PROVIDER_SITE_OTHER): Payer: Medicare PPO

## 2020-10-10 ENCOUNTER — Other Ambulatory Visit: Payer: Self-pay

## 2020-10-10 DIAGNOSIS — I739 Peripheral vascular disease, unspecified: Secondary | ICD-10-CM

## 2020-10-10 DIAGNOSIS — Z95820 Peripheral vascular angioplasty status with implants and grafts: Secondary | ICD-10-CM

## 2020-10-10 DIAGNOSIS — Z95828 Presence of other vascular implants and grafts: Secondary | ICD-10-CM

## 2020-10-10 NOTE — Telephone Encounter (Signed)
Pt has appt for tomorrow 11/16 day after PV procedure follow up w/ Dr. Fletcher Anon. Also has (3 week) f/u scheduled 12/2.

## 2020-10-11 ENCOUNTER — Ambulatory Visit: Payer: Medicare PPO | Admitting: Cardiovascular Disease

## 2020-10-11 ENCOUNTER — Encounter: Payer: Self-pay | Admitting: Cardiovascular Disease

## 2020-10-11 VITALS — BP 128/70 | HR 76 | Ht 67.0 in | Wt 102.1 lb

## 2020-10-11 DIAGNOSIS — R0602 Shortness of breath: Secondary | ICD-10-CM

## 2020-10-11 DIAGNOSIS — Z720 Tobacco use: Secondary | ICD-10-CM

## 2020-10-11 DIAGNOSIS — I739 Peripheral vascular disease, unspecified: Secondary | ICD-10-CM | POA: Diagnosis not present

## 2020-10-11 DIAGNOSIS — I10 Essential (primary) hypertension: Secondary | ICD-10-CM

## 2020-10-11 DIAGNOSIS — E7800 Pure hypercholesterolemia, unspecified: Secondary | ICD-10-CM | POA: Diagnosis not present

## 2020-10-11 NOTE — Patient Instructions (Signed)
Medication Instructions:  Your physician recommends that you continue on your current medications as directed. Please refer to the Current Medication list given to you today.  *If you need a refill on your cardiac medications before your next appointment, please call your pharmacy*   Lab Work: None ordered If you have labs (blood work) drawn today and your tests are completely normal, you will receive your results only by: Marland Kitchen MyChart Message (if you have MyChart) OR . A paper copy in the mail If you have any lab test that is abnormal or we need to change your treatment, we will call you to review the results.   Testing/Procedures: Your physician has requested that you have an echocardiogram. Echocardiography is a painless test that uses sound waves to create images of your heart. It provides your doctor with information about the size and shape of your heart and how well your heart's chambers and valves are working. This procedure takes approximately one hour. There are no restrictions for this procedure.     Follow-Up: At [facility], you and your health needs are our priority.  As part of our continuing mission to provide you with exceptional heart care, we have created designated Provider Care Teams.  These Care Teams include your primary Cardiologist (physician) and Advanced Practice Providers (APPs -  Physician Assistants and Nurse Practitioners) who all work together to provide you with the care you need, when you need it.  We recommend signing up for the patient portal called "MyChart".  Sign up information is provided on this After Visit Summary.  MyChart is used to connect with patients for Virtual Visits (Telemedicine).  Patients are able to view lab/test results, encounter notes, upcoming appointments, etc.  Non-urgent messages can be sent to your provider as well.   To learn more about what you can do with MyChart, go to [email or web address].    Your next appointment:   4  week(s)  The format for your next appointment:   In Person  Provider:   You may see [name], MD or one of the following Advanced Practice Providers on your designated Care Team:    Dunkleberger Hodgkins, NP  Christell Faith, PA-C  Marrianne Mood, PA-C  Cadence Lackawanna, [place]  Laurann Montana, NP    Other Instructions N/A

## 2020-10-11 NOTE — Progress Notes (Signed)
Cardiology Office Note   Date:  10/11/2020   ID:  Tracie Morris, DOB 1956/06/25, MRN [id number]  PCP:  [name], MD  Cardiologist:   [name], MD   Chief Complaint  Patient presents with  . OTHER    Post PAD c/o rapid heart beat, weakness, lightheaded and sob. Meds reviewed verbally with pt.      History of Present Illness: Tracie Morris is a 64 y.o. female who presents for a followup visit regarding peripheral arterial disease.  She is status post left common iliac artery stent placement in November of 2014 for significant claudication with subsequent covered stent placement in 2017 for in situ thrombosis with subtotal occlusion. She was diagnosed with hereditary hemochromatosis which might have been the trigger for very late stent thrombosis.  Other Medical history include  tobacco use and hyperlipidemia   Previous CT scan of the chest did show calcifications in the coronary arteries. 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  She was seen recently for severe left leg claudication with rest pain that started after she held Plavix and Colostrum before colonoscopy.  Vascular testing confirmed occlusion of the left common iliac stent with significant drop in ABI. I proceeded with angiography last week which showed stent thrombosis of the left common iliac artery with large amount of thrombus.  I performed successful mechanical thrombectomy and balloon angioplasty of the left common iliac artery.  It was complicated by embolization into the internal iliac artery which necessitated treatment with mechanical thrombectomy.  In addition, there was embolization to the left mid SFA which was treated with mechanical thrombectomy and self-expanding stent placement.  She was treated with Aggrastat infusion for 18 hours after the procedure.  The procedure was associated with significant blood loss due to multiple rounds of mechanical thrombectomy  performed.  Hemoglobin dropped from 17-12 but there was no other signs of bleeding.  She has been doing well and reports significant improvement in left leg pain.  Postprocedure ABI improved to 0.93 on the left.  Duplex showed still persistently elevated velocity in the proximal portion of the left common iliac artery stent. She reports some fatigue, increased shortness of breath and exertional palpitations.  She started taking Chantix and is planning to stop smoking 2 days from now.  Past Medical History:  Diagnosis Date  . Arthritis   . Basal cell carcinoma 03/29/2014   L lower abdomen  . Basal cell carcinoma 01/18/2020   central nasal tip  . Breast mass 09/05/2020   right breast mass 10 oclock  . Carotid artery occlusion   . Hemochromatosis   . Hyperlipidemia   . Hypocalcemia   . hypothyroidism 2007   acquired, secondary to surgery for thyroid Ca  . Hypothyroidism   . Migraine    improved since thyroid surgery, 2 years  . multinodular goiter 2007   s/p thyroidectomy  . 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 Repeat angiography in November of 2014 showed 50% distal aortic stenosis extending into the left common iliac artery which had 60-70% ostial stenosis. No other significant disease. I placed on balloon expandable stent into  left common iliac artery extending slightly into the distal aorta  . PONV (postoperative nausea and vomiting)   . 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 ABDOMINAL AORTAGRAM N/A 10/14/2013   Procedure: ABDOMINAL [name];  Surgeon: Wellington Hampshire, MD;  Location: [facility];  Service: Cardiovascular;  Laterality: N/A;  . ABDOMINAL AORTOGRAM W/LOWER EXTREMITY N/A 10/05/2020   Procedure: ABDOMINAL AORTOGRAM W/LOWER EXTREMITY;  Surgeon: Wellington Hampshire, MD;  Location:  [place] [facility];  Service: Cardiovascular;  Laterality: N/A;  . ANGIOPLASTY ILLIAC ARTERY    . CESAREAN SECTION    . COLONOSCOPY WITH PROPOFOL N/A 09/22/2020   Procedure: COLONOSCOPY WITH PROPOFOL;  Surgeon: Lin Landsman, MD;  Location: [facility];  Service: Endoscopy;  Laterality: N/A;  . NOSE SURGERY    . PERIPHERAL VASCULAR CATHETERIZATION N/A 11/14/2016   Procedure: Abdominal Aortogram;  Surgeon: Wellington Hampshire, MD;  Location: [facility];  Service: Cardiovascular;  Laterality: N/A;  . PERIPHERAL VASCULAR CATHETERIZATION Bilateral 11/14/2016   Procedure: Lower Extremity Angiography;  Surgeon: Wellington Hampshire, MD;  Location: [place] [facility];  Service: Cardiovascular;  Laterality: Bilateral;  limited iliacs only  . PERIPHERAL VASCULAR CATHETERIZATION Left 11/14/2016   Procedure: Peripheral Vascular Intervention;  Surgeon: Wellington Hampshire, MD;  Location: [place];  Service: Cardiovascular;  Laterality: Left;  common iliac  . PERIPHERAL VASCULAR INTERVENTION  10/05/2020   Procedure: PERIPHERAL VASCULAR INTERVENTION;  Surgeon: Wellington Hampshire, MD;  Location: [facility];  Service: Cardiovascular;;  left common iliac   . PERIPHERAL VASCULAR THROMBECTOMY  10/05/2020   Procedure: PERIPHERAL VASCULAR THROMBECTOMY;  Surgeon: Wellington Hampshire, MD;  Location: [place] [facility];  Service: Cardiovascular;;  Mechanical - Left common iliac, left internal iliac, and left SFA  . POLYPECTOMY  09/22/2020   Procedure: POLYPECTOMY;  Surgeon: Lin Landsman, MD;  Location: [facility];  Service: Endoscopy;;  . SKIN CANCER EXCISION    . THYROID SURGERY  2007   secondary to thyroid cancer     Current Outpatient Medications  Medication Sig Dispense Refill  . aspirin EC 81 MG tablet Take 1 tablet (81 mg total) by mouth daily. Swallow whole.    . clopidogrel (PLAVIX) 75 MG tablet TAKE 1 TABLET BY MOUTH ONCE DAILY (Patient taking differently: Take  75 mg by mouth daily. TAKE 1 TABLET BY MOUTH ONCE DAILY) 90 tablet 1  . Colostrum 500 MG CAPS Take 500 mg by mouth daily.     Marland Kitchen escitalopram (LEXAPRO) 10 MG tablet Take 1 tablet (10 mg total) by mouth daily. (Patient taking differently: Take 10 mg by mouth at bedtime. ) 30 tablet 5  . Glycerin-Hypromellose-PEG 400 (DRY EYE RELIEF DROPS) 0.2-0.2-1 % SOLN Place 1 drop into both eyes daily as needed (Dry eye).    Marland Kitchen levothyroxine (SYNTHROID) 100 MCG tablet Take 1 tablet (100 mcg total) by mouth daily before breakfast. 60 tablet 3  . Multiple Vitamin (MULTIVITAMIN WITH MINERALS) TABS tablet Take 1 tablet by mouth daily.    . propranolol (INDERAL) 10 MG tablet TAKE 1 TABLET BY MOUTH 3 TIMES DAILY AS NEEDED FOR TREMOR (Patient taking differently: Take 10 mg by mouth daily. ) 90 tablet 1  . rosuvastatin (CRESTOR) 40 MG tablet Take 0.5 tablets (20 mg total) by mouth daily. 30 tablet 2  . varenicline (CHANTIX CONTINUING MONTH PAK) 1 MG tablet Take 1 tablet (1 mg total) by mouth 2 (two) times daily. 60 tablet 1  . varenicline (CHANTIX STARTING MONTH PAK) 0.5 MG X 11 & 1 MG X 42 tablet Take one 0.5 mg tablet by  mouth once daily for 3 days, then increase to one 0.5 mg tablet twice daily for 4 days, then increase to one 1 mg tablet twice daily. 53 tablet 0   No current facility-administered medications for this visit.    Allergies:   Atorvastatin, Darvocet [propoxyphene n-acetaminophen], Demerol, Methocarbamol, Mirtazapine, Primidone, and Ambien [zolpidem tartrate]    Social History:  The patient  reports that she has been smoking cigarettes. She has a 20.00 pack-year smoking history. She has never used smokeless tobacco. She reports previous alcohol use. She reports that she does not use drugs.   Family History:  The patient's family history includes Alzheimer's disease in her father; Coronary artery disease in her maternal grandmother; Coronary artery disease (age of onset: 2) in her paternal grandmother;  Heart disease (age of onset: 52) in her maternal grandmother; Heart disease (age of onset: 20) in her paternal grandmother; Stroke in her mother.    ROS:  Please see the history of present illness.   Otherwise, review of systems are positive for none.   All other systems are reviewed and negative.    PHYSICAL EXAM: VS:  BP 128/70 (BP Location: Left Arm, Patient Position: Sitting, Cuff Size: Normal)   Pulse 76   Ht 5\' 7"  (1.702 m)   Wt 102 lb 2 oz (46.3 kg)   SpO2 98%   BMI 16.00 kg/m  , BMI Body mass index is 16 kg/m. GEN: Well nourished, well developed, in no acute distress  HEENT: normal  Neck: no JVD, carotid bruits, or masses Cardiac: RRR; no murmurs, rubs, or gallops,no edema  Respiratory:  clear to auscultation bilaterally, normal work of breathing GI: soft, nontender, nondistended, + BS MS: no deformity or atrophy  Skin: warm and dry, no rash Neuro:  Strength and sensation are intact Psych: euthymic mood, full affect Vascular: Femoral pulse: +2 on the right and +1 on the left.  Posterior tibial pulses +1 on the left.  There is superficial bruising but no hematoma.  EKG:  EKG is  ordered today. EKG showed normal sinus rhythm with no significant ST or T wave changes.   Recent Labs: 05/20/2020: TSH 0.03 07/29/2020: ALT 10 10/06/2020: BUN 7; Creatinine, Ser 0.63; Hemoglobin 12.3; Platelets 174; Potassium 4.0; Sodium 142    Lipid Panel    Component Value Date/Time   CHOL 196 02/29/2020 1206   TRIG 96 02/29/2020 1206   HDL 46 02/29/2020 1206   CHOLHDL 4.3 02/29/2020 1206   CHOLHDL 3 02/23/2019 0923   VLDL 29.6 02/23/2019 0923   LDLCALC 133 (H) 02/29/2020 1206      Wt Readings from Last 3 Encounters:  10/11/20 102 lb 2 oz (46.3 kg)  10/06/20 104 lb 11.5 oz (47.5 kg)  09/27/20 107 lb (48.5 kg)        ASSESSMENT AND PLAN:  1.  Peripheral arterial disease: Status post recent mechanical thrombectomy and balloon angioplasty of left common iliac artery stent  thrombosis.  Also required self-expanding stent placement to the left SFA due to embolization.  Suspect that the patient has hypercoagulable state likely related to hemochromatosis.  Postprocedure ABI showed significant improvement.  She continues to have elevated velocity in the left common iliac artery in spite of aggressive balloon angioplasty in that area.  Repeat procedure in the near future might be needed to address that but it is better to wait at least 1 to 2 months to ensure resolution of thrombus.  Continue dual antiplatelet therapy for now.  2. Hyperlipidemia: Continue  rosuvastatin 20 mg daily.  3. Carotid disease: This was mild on recent carotid Dopple in May.  4. Coronary calcifications: No anginal symptoms. Continue aggressive treatment of risk factors.  5. Tobacco use: She has been using Chantix and is planning to quit within 2 days.  6.  Exertional palpitations and shortness of breath: Suspect that it could be related to blood loss anemia from the procedure.  I am going to obtain an echocardiogram to evaluate the symptoms.   Disposition: Follow-up with me in 1 month.  Signed,  [name], MD  10/11/2020 4:34 PM    [place]

## 2020-10-14 ENCOUNTER — Other Ambulatory Visit: Payer: Self-pay

## 2020-10-14 ENCOUNTER — Ambulatory Visit (INDEPENDENT_AMBULATORY_CARE_PROVIDER_SITE_OTHER): Payer: Medicare PPO

## 2020-10-14 DIAGNOSIS — R0602 Shortness of breath: Secondary | ICD-10-CM

## 2020-10-14 LAB — ECHOCARDIOGRAM COMPLETE
AR max vel: 2.9 cm2
AV Area VTI: 3.05 cm2
AV Area mean vel: 2.7 cm2
AV Mean grad: 2 mmHg
AV Peak grad: 3.4 mmHg
Ao pk vel: 0.92 m/s
Area-P 1/2: 5.54 cm2
Calc EF: 62.6 %
S' Lateral: 2.2 cm
Single Plane A2C EF: 63.9 %
Single Plane A4C EF: 60.2 %

## 2020-10-14 NOTE — Addendum Note (Signed)
Addended by: Britt Bottom on: 10/14/2020 12:47 PM   Modules accepted: Orders

## 2020-10-27 ENCOUNTER — Other Ambulatory Visit: Payer: Self-pay

## 2020-10-27 ENCOUNTER — Encounter: Payer: Self-pay | Admitting: Cardiovascular Disease

## 2020-10-27 ENCOUNTER — Ambulatory Visit: Payer: Medicare PPO | Admitting: Cardiovascular Disease

## 2020-10-27 VITALS — BP 140/80 | HR 72 | Ht 68.0 in | Wt 110.0 lb

## 2020-10-27 DIAGNOSIS — I779 Disorder of arteries and arterioles, unspecified: Secondary | ICD-10-CM | POA: Diagnosis not present

## 2020-10-27 DIAGNOSIS — I739 Peripheral vascular disease, unspecified: Secondary | ICD-10-CM | POA: Diagnosis not present

## 2020-10-27 DIAGNOSIS — E7800 Pure hypercholesterolemia, unspecified: Secondary | ICD-10-CM

## 2020-10-27 MED ORDER — CLOPIDOGREL BISULFATE 75 MG PO TABS
ORAL_TABLET | ORAL | 2 refills | Status: DC
Start: 2020-10-27 — End: 2021-12-21

## 2020-10-27 MED ORDER — ROSUVASTATIN CALCIUM 40 MG PO TABS
40.0000 mg | ORAL_TABLET | Freq: Every day | ORAL | 2 refills | Status: DC
Start: 2020-10-27 — End: 2021-10-09

## 2020-10-27 NOTE — Patient Instructions (Addendum)
Medication Instructions:  Your physician has recommended you make the following change in your medication:   INCREASE Crestor (rosuvastatin) to 40 mg daily. An Rx has been sent to your pharmacy.  Plavix has been refilled today.   *If you need a refill on your cardiac medications before your next appointment, please call your pharmacy*   Lab Work: None ordered  If you have labs (blood work) drawn today and your tests are completely normal, you will receive your results only by: Marland Kitchen MyChart Message (if you have MyChart) OR . A paper copy in the mail If you have any lab test that is abnormal or we need to change your treatment, we will call you to review the results.   Testing/Procedures: PV testing in March 2022 as planned  Follow-Up: At [facility], you and your health needs are our priority.  As part of our continuing mission to provide you with exceptional heart care, we have created designated Provider Care Teams.  These Care Teams include your primary Cardiologist (physician) and Advanced Practice Providers (APPs -  Physician Assistants and Nurse Practitioners) who all work together to provide you with the care you need, when you need it.  We recommend signing up for the patient portal called "MyChart".  Sign up information is provided on this After Visit Summary.  MyChart is used to connect with patients for Virtual Visits (Telemedicine).  Patients are able to view lab/test results, encounter notes, upcoming appointments, etc.  Non-urgent messages can be sent to your provider as well.   To learn more about what you can do with MyChart, go to [email or web address].    Your next appointment:   March 2022 after testing  The format for your next appointment:   In Person  Provider:   [name], MD   Other Instructions N/A

## 2020-10-27 NOTE — Progress Notes (Signed)
Cardiology Office Note   Date:  10/27/2020   ID:  Tracie Morris, DOB Oct 17, 1956, MRN [id number]  PCP:  [name], MD  Cardiologist:   [name], MD   Chief Complaint  Patient presents with  . office visit    F/U aoppointment-Patient would like to discuss statin dosage; Meds verbally reviewed with patient.      History of Present Illness: Tracie Morris is a 64 y.o. female who presents for a followup visit regarding peripheral arterial disease.  She is status post left common iliac artery stent placement in November of 2014 for significant claudication with subsequent covered stent placement in 2017 for in situ thrombosis with subtotal occlusion. She was diagnosed with hereditary hemochromatosis which might have been the trigger for very late stent thrombosis.  Other Medical history include  tobacco use and hyperlipidemia   Previous CT scan of the chest did show calcifications in the coronary arteries. 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  She was seen recently for severe left leg claudication with rest pain that started after she held Plavix and Colostrum before colonoscopy.  Vascular testing confirmed occlusion of the left common iliac stent with significant drop in ABI. Angiography was done last month which showed stent thrombosis of the left common iliac artery with large amount of thrombus.  I performed successful mechanical thrombectomy and balloon angioplasty of the left common iliac artery.  It was complicated by embolization into the internal iliac artery which necessitated treatment with mechanical thrombectomy.  In addition, there was embolization to the left mid SFA which was treated with mechanical thrombectomy and self-expanding stent placement.  She was treated with Aggrastat infusion for 18 hours after the procedure.  The procedure was associated with significant blood loss due to multiple rounds of mechanical thrombectomy  performed.  Hemoglobin dropped from 17-12 but there was no other signs of bleeding.  She has been doing well and reports significant improvement in left leg pain.  Postprocedure ABI improved to 0.93 on the left.  Duplex showed still persistently elevated velocity in the proximal portion of the left common iliac artery stent.  During her follow-up she complained of increased fatigue and shortness of breath.  I felt that some of her symptoms were related to blood loss.  An echocardiogram was done which showed normal LV systolic function with no evidence of pulmonary hypertension.  There was mild to moderate mitral regurgitation. She quit smoking with Chantix and gained 8 pounds.  She is feeling better overall.  She continues to have mild left leg discomfort.  No chest pain.  Past Medical History:  Diagnosis Date  . Arthritis   . Basal cell carcinoma 03/29/2014   L lower abdomen  . Basal cell carcinoma 01/18/2020   central nasal tip  . Breast mass 09/05/2020   right breast mass 10 oclock  . Carotid artery occlusion   . Hemochromatosis   . Hyperlipidemia   . Hypocalcemia   . hypothyroidism 2007   acquired, secondary to surgery for thyroid Ca  . Hypothyroidism   . Migraine    improved since thyroid surgery, 2 years  . multinodular goiter 2007   s/p thyroidectomy  . 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 Repeat angiography in November of 2014 showed 50% distal aortic stenosis extending into the left common iliac artery which had 60-70% ostial stenosis.  No other significant disease. I placed on balloon expandable stent into  left common iliac artery extending slightly into the distal aorta  . PONV (postoperative nausea and vomiting)   . 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 ABDOMINAL AORTAGRAM N/A  10/14/2013   Procedure: ABDOMINAL [name];  Surgeon: Wellington Hampshire, MD;  Location: [place] [facility];  Service: Cardiovascular;  Laterality: N/A;  . ABDOMINAL AORTOGRAM W/LOWER EXTREMITY N/A 10/05/2020   Procedure: ABDOMINAL AORTOGRAM W/LOWER EXTREMITY;  Surgeon: Wellington Hampshire, MD;  Location: [place] [facility];  Service: Cardiovascular;  Laterality: N/A;  . ANGIOPLASTY ILLIAC ARTERY    . CESAREAN SECTION    . COLONOSCOPY WITH PROPOFOL N/A 09/22/2020   Procedure: COLONOSCOPY WITH PROPOFOL;  Surgeon: Lin Landsman, MD;  Location: [place];  Service: Endoscopy;  Laterality: N/A;  . NOSE SURGERY    . PERIPHERAL VASCULAR CATHETERIZATION N/A 11/14/2016   Procedure: Abdominal Aortogram;  Surgeon: Wellington Hampshire, MD;  Location: [place] [facility];  Service: Cardiovascular;  Laterality: N/A;  . PERIPHERAL VASCULAR CATHETERIZATION Bilateral 11/14/2016   Procedure: Lower Extremity Angiography;  Surgeon: Wellington Hampshire, MD;  Location: [place] [facility];  Service: Cardiovascular;  Laterality: Bilateral;  limited iliacs only  . PERIPHERAL VASCULAR CATHETERIZATION Left 11/14/2016   Procedure: Peripheral Vascular Intervention;  Surgeon: Wellington Hampshire, MD;  Location: [place] [facility];  Service: Cardiovascular;  Laterality: Left;  common iliac  . PERIPHERAL VASCULAR INTERVENTION  10/05/2020   Procedure: PERIPHERAL VASCULAR INTERVENTION;  Surgeon: Wellington Hampshire, MD;  Location: [place] [facility];  Service: Cardiovascular;;  left common iliac   . PERIPHERAL VASCULAR THROMBECTOMY  10/05/2020   Procedure: PERIPHERAL VASCULAR THROMBECTOMY;  Surgeon: Wellington Hampshire, MD;  Location: [place] [facility];  Service: Cardiovascular;;  Mechanical - Left common iliac, left internal iliac, and left SFA  . POLYPECTOMY  09/22/2020   Procedure: POLYPECTOMY;  Surgeon: Lin Landsman, MD;  Location: [place];  Service: Endoscopy;;  . SKIN CANCER EXCISION    . THYROID SURGERY   2007   secondary to thyroid cancer     Current Outpatient Medications  Medication Sig Dispense Refill  . aspirin EC 81 MG tablet Take 1 tablet (81 mg total) by mouth daily. Swallow whole.    . clopidogrel (PLAVIX) 75 MG tablet TAKE 1 TABLET BY MOUTH ONCE DAILY 90 tablet 1  . Colostrum 500 MG CAPS Take 500 mg by mouth daily.     Marland Kitchen escitalopram (LEXAPRO) 10 MG tablet Take 1 tablet (10 mg total) by mouth daily. 30 tablet 5  . Glycerin-Hypromellose-PEG 400 (DRY EYE RELIEF DROPS) 0.2-0.2-1 % SOLN Place 1 drop into both eyes daily as needed (Dry eye).    Marland Kitchen levothyroxine (SYNTHROID) 100 MCG tablet Take 1 tablet (100 mcg total) by mouth daily before breakfast. 60 tablet 3  . Multiple Vitamin (MULTIVITAMIN WITH MINERALS) TABS tablet Take 1 tablet by mouth daily.    . propranolol (INDERAL) 10 MG tablet TAKE 1 TABLET BY MOUTH 3 TIMES DAILY AS NEEDED FOR TREMOR 90 tablet 1  . rosuvastatin (CRESTOR) 40 MG tablet Take 0.5 tablets (20 mg total) by mouth daily. 30 tablet 2  . varenicline (CHANTIX CONTINUING MONTH PAK) 1 MG tablet Take 1 tablet (1 mg total) by mouth 2 (two) times daily. 60 tablet 1   No current facility-administered medications for this visit.  Allergies:   Atorvastatin, Darvocet [propoxyphene n-acetaminophen], Demerol, Methocarbamol, Mirtazapine, Primidone, and Ambien [zolpidem tartrate]    Social History:  The patient  reports that she quit smoking about 9 years ago. Her smoking use included cigarettes. She has a 20.00 pack-year smoking history. She has never used smokeless tobacco. She reports previous alcohol use. She reports that she does not use drugs.   Family History:  The patient's family history includes Alzheimer's disease in her father; Coronary artery disease in her maternal grandmother; Coronary artery disease (age of onset: 47) in her paternal grandmother; Heart disease (age of onset: 43) in her maternal grandmother; Heart disease (age of onset: 46) in her paternal  grandmother; Stroke in her mother.    ROS:  Please see the history of present illness.   Otherwise, review of systems are positive for none.   All other systems are reviewed and negative.    PHYSICAL EXAM: VS:  BP 140/80 (BP Location: Left Arm, Patient Position: Sitting, Cuff Size: Normal)   Pulse 72   Ht 5\' 8"  (1.727 m)   Wt 110 lb (49.9 kg)   SpO2 98%   BMI 16.73 kg/m  , BMI Body mass index is 16.73 kg/m. GEN: Well nourished, well developed, in no acute distress  HEENT: normal  Neck: no JVD, carotid bruits, or masses Cardiac: RRR; no murmurs, rubs, or gallops,no edema  Respiratory:  clear to auscultation bilaterally, normal work of breathing GI: soft, nontender, nondistended, + BS MS: no deformity or atrophy  Skin: warm and dry, no rash Neuro:  Strength and sensation are intact Psych: euthymic mood, full affect Vascular: Femoral pulse: +2 on the right and +1 on the left.  Posterior tibial pulses +1 on the left.    EKG:  EKG is not ordered today.    Recent Labs: 05/20/2020: TSH 0.03 07/29/2020: ALT 10 10/06/2020: BUN 7; Creatinine, Ser 0.63; Hemoglobin 12.3; Platelets 174; Potassium 4.0; Sodium 142    Lipid Panel    Component Value Date/Time   CHOL 196 02/29/2020 1206   TRIG 96 02/29/2020 1206   HDL 46 02/29/2020 1206   CHOLHDL 4.3 02/29/2020 1206   CHOLHDL 3 02/23/2019 0923   VLDL 29.6 02/23/2019 0923   LDLCALC 133 (H) 02/29/2020 1206      Wt Readings from Last 3 Encounters:  10/27/20 110 lb (49.9 kg)  10/11/20 102 lb 2 oz (46.3 kg)  10/06/20 104 lb 11.5 oz (47.5 kg)        ASSESSMENT AND PLAN:  1.  Peripheral arterial disease: Status post recent mechanical thrombectomy and balloon angioplasty of left common iliac artery stent thrombosis.  Also required self-expanding stent placement to the left SFA due to embolization.  Suspect that the patient has hypercoagulable state likely related to hemochromatosis.  Postprocedure ABI showed significant improvement.   She continues to have elevated velocity in the left common iliac artery in spite of aggressive balloon angioplasty in that area.  Continue dual antiplatelet therapy.  The plan is to repeat ABI and aortoiliac duplex in March.  If there is persistently high velocity in the left common iliac artery stent, angiography would be needed to decrease the chance of acute closure.  2. Hyperlipidemia: Currently on rosuvastatin 20 mg daily.  Most recent lipid profile showed an LDL of 133.  I increased rosuvastatin to 40 mg daily.  3. Carotid disease: This was mild on recent carotid Dopple in May.  4. Coronary calcifications: No anginal symptoms. Continue aggressive treatment of risk factors.  5. Tobacco use: I congratulated her on smoking cessation.    Disposition: Follow-up with me in 3 months.  Signed,  [name], MD  10/27/2020 11:56 AM    [place]

## 2020-11-28 ENCOUNTER — Ambulatory Visit: Payer: BC Managed Care – PPO | Admitting: Dermatology

## 2020-12-07 ENCOUNTER — Other Ambulatory Visit: Payer: Self-pay

## 2020-12-07 ENCOUNTER — Ambulatory Visit: Payer: Medicare PPO | Admitting: Dermatology

## 2020-12-07 ENCOUNTER — Encounter: Payer: Self-pay | Admitting: Dermatology

## 2020-12-07 DIAGNOSIS — Z86007 Personal history of in-situ neoplasm of skin: Secondary | ICD-10-CM

## 2020-12-07 DIAGNOSIS — Z85828 Personal history of other malignant neoplasm of skin: Secondary | ICD-10-CM

## 2020-12-07 DIAGNOSIS — D229 Melanocytic nevi, unspecified: Secondary | ICD-10-CM

## 2020-12-07 DIAGNOSIS — D2262 Melanocytic nevi of left upper limb, including shoulder: Secondary | ICD-10-CM

## 2020-12-07 DIAGNOSIS — D2222 Melanocytic nevi of left ear and external auricular canal: Secondary | ICD-10-CM

## 2020-12-07 NOTE — Progress Notes (Signed)
   Follow-Up Visit   Subjective  Tracie Morris is a 65 y.o. female who presents for the following: Follow-up (6 months f/u Hx of BCC at the central tip of nose, treated with Mohs at [facility], pt going to [facility] for dermabrasion for the scar on the nose from mohs surgery ) and Nevus (6 months f/u recheck moles on the Left ear and Left arm, pt report no known changes).    The following portions of the chart were reviewed this encounter and updated as appropriate:       Review of Systems:  No other skin or systemic complaints except as noted in HPI or Assessment and Plan.  Objective  Well appearing patient in no apparent distress; mood and affect are within normal limits.  A focused examination was performed including face,ears,left arm . Relevant physical exam findings are noted in the Assessment and Plan.  Objective  central nasal tip, left lower abdomen: Well healed scar with no evidence of recurrence. R side of scar on nose with prominent textural change.  Objective  right nasal tip: Well healed scar with no evidence of recurrence.   Objective  L antihelix, L upper arm: 2.0 mm grey blue macule x 2     Assessment & Plan  History of basal cell carcinoma (BCC) central nasal tip, left lower abdomen  Clear. Observe for recurrence. Call clinic for new or changing lesions.  Recommend regular skin exams, daily broad-spectrum spf 30+ sunscreen use, and photoprotection.     Patient not happy with scar appearance on nose.  She has appointment with Dr. Manley Mason next month.  She will consider asking him if surgical scar revision/repair an option, patient is not seeing much improvement from dermabrasions.   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 Observe for recurrence. Call clinic for new or changing lesions.  Recommend regular skin exams, daily broad-spectrum spf 30+ sunscreen use, and photoprotection.     Nevus L antihelix, L upper  arm  Benign-appearing.  Stable. Observation.  Call clinic for new or changing moles.  Recommend daily use of broad spectrum spf 30+ sunscreen to sun-exposed areas.    Return in about 6 months (around 06/06/2021) for TBSE, HX of BCC, Hx of SCCis .  I, Tracie Morris, CMA, am acting as scribe for Tracie Patty, MD .  Documentation: I have reviewed the above documentation for accuracy and completeness, and I agree with the above.  Tracie Patty MD

## 2020-12-07 NOTE — Patient Instructions (Signed)
Melanoma ABCDEs  Melanoma is the most dangerous type of skin cancer, and is the leading cause of death from skin disease.  You are more likely to develop melanoma if you:  Have light-colored skin, light-colored eyes, or red or blond hair  Spend a lot of time in the sun  Tan regularly, either outdoors or in a tanning bed  Have had blistering sunburns, especially during childhood  Have a close family member who has had a melanoma  Have atypical moles or large birthmarks  Early detection of melanoma is key since treatment is typically straightforward and cure rates are extremely high if we catch it early.   The first sign of melanoma is often a change in a mole or a new dark spot.  The ABCDE system is a way of remembering the signs of melanoma.  A for asymmetry:  The two halves do not match. B for border:  The edges of the growth are irregular. C for color:  A mixture of colors are present instead of an even brown color. D for diameter:  Melanomas are usually (but not always) greater than 6mm - the size of a pencil eraser. E for evolution:  The spot keeps changing in size, shape, and color.  Please check your skin once per month between visits. You can use a small mirror in front and a large mirror behind you to keep an eye on the back side or your body.   If you see any new or changing lesions before your next follow-up, please call to schedule a visit.  Please continue daily skin protection including broad spectrum sunscreen SPF 30+ to sun-exposed areas, reapplying every 2 hours as needed when you're outdoors.   

## 2020-12-15 ENCOUNTER — Telehealth: Payer: Self-pay | Admitting: Internal Medicine

## 2020-12-15 DIAGNOSIS — D62 Acute posthemorrhagic anemia: Secondary | ICD-10-CM

## 2020-12-15 DIAGNOSIS — R42 Dizziness and giddiness: Secondary | ICD-10-CM

## 2020-12-15 NOTE — Telephone Encounter (Signed)
Triage please.

## 2020-12-15 NOTE — Telephone Encounter (Signed)
Spoken to patient, she lost 3 pints in December due to blood clot procedure. Since December she has been feeling very weak/no energy, mini hot flashes, and lightheaded at times. No SOB, Heart palpitations, headaches, nausea, vomiting and blurry vision. She feels like these sx should have gone away since procedure from Dr Fletcher Anon. Patient stated she has not contacted Dr Fletcher Anon, she feels Dr Derrel Nip can place lab work to evaluate sx to see if she can take any supplement to help.

## 2020-12-15 NOTE — Telephone Encounter (Signed)
Patient called in stated that she lost 3 quarts of blood back in December and now she feel very bad she wanted to know if she needs to have blood work done

## 2020-12-15 NOTE — Telephone Encounter (Signed)
I have ordered a cbc, iron and b12 .  She will need to go to [facility] tomorrow to have it done.  I cannot  do anything else without seeing her so  encourage her to schedule an OV to follow up on labs and symptoms

## 2020-12-16 ENCOUNTER — Other Ambulatory Visit
Admission: RE | Admit: 2020-12-16 | Discharge: 2020-12-16 | Disposition: A | Discharge status: Home / Self Care | Payer: Medicare PPO | Attending: Internal Medicine | Admitting: Internal Medicine

## 2020-12-16 DIAGNOSIS — D62 Acute posthemorrhagic anemia: Secondary | ICD-10-CM | POA: Insufficient documentation

## 2020-12-16 DIAGNOSIS — R42 Dizziness and giddiness: Secondary | ICD-10-CM

## 2020-12-16 LAB — CBC WITH DIFFERENTIAL/PLATELET
Abs Immature Granulocytes: 0.03 10*3/uL (ref 0.00–0.07)
Basophils Absolute: 0.1 10*3/uL (ref 0.0–0.1)
Basophils Relative: 1 %
Eosinophils Absolute: 0.4 10*3/uL (ref 0.0–0.5)
Eosinophils Relative: 3 %
HCT: 43.8 % (ref 36.0–46.0)
Hemoglobin: 15.3 g/dL — ABNORMAL HIGH (ref 12.0–15.0)
Immature Granulocytes: 0 %
Lymphocytes Relative: 35 %
Lymphs Abs: 3.8 10*3/uL (ref 0.7–4.0)
MCH: 34.1 pg — ABNORMAL HIGH (ref 26.0–34.0)
MCHC: 34.9 g/dL (ref 30.0–36.0)
MCV: 97.6 fL (ref 80.0–100.0)
Monocytes Absolute: 0.8 10*3/uL (ref 0.1–1.0)
Monocytes Relative: 7 %
Neutro Abs: 5.7 10*3/uL (ref 1.7–7.7)
Neutrophils Relative %: 54 %
Platelets: 267 10*3/uL (ref 150–400)
RBC: 4.49 MIL/uL (ref 3.87–5.11)
RDW: 12.2 % (ref 11.5–15.5)
WBC: 10.7 10*3/uL — ABNORMAL HIGH (ref 4.0–10.5)
nRBC: 0 % (ref 0.0–0.2)

## 2020-12-16 LAB — IRON AND TIBC
Iron: 110 ug/dL (ref 28–170)
Saturation Ratios: 24 % (ref 10.4–31.8)
TIBC: 461 ug/dL — ABNORMAL HIGH (ref 250–450)
UIBC: 351 ug/dL

## 2020-12-16 LAB — FERRITIN: Ferritin: 13 ng/mL (ref 11–307)

## 2020-12-16 LAB — FOLATE: Folate: 27 ng/mL (ref >5.9)

## 2020-12-16 LAB — VITAMIN B12: Vitamin B-12: 393 pg/mL (ref 180–914)

## 2020-12-16 NOTE — Telephone Encounter (Signed)
Patient has been informed and appointment has been scheduled.

## 2020-12-16 NOTE — Telephone Encounter (Signed)
Left message for patient to return call back.  

## 2020-12-18 NOTE — Progress Notes (Signed)
Your labs are normal. Please schedule an appt for any additional workup of symptoms.

## 2020-12-19 ENCOUNTER — Ambulatory Visit: Payer: Medicare PPO | Admitting: Internal Medicine

## 2020-12-19 ENCOUNTER — Encounter: Payer: Self-pay | Admitting: Internal Medicine

## 2020-12-19 ENCOUNTER — Other Ambulatory Visit: Payer: Self-pay

## 2020-12-19 VITALS — BP 134/76 | HR 79 | Temp 98.4°F | Resp 14 | Ht 68.0 in | Wt 112.2 lb

## 2020-12-19 DIAGNOSIS — R42 Dizziness and giddiness: Secondary | ICD-10-CM | POA: Diagnosis not present

## 2020-12-19 DIAGNOSIS — I951 Orthostatic hypotension: Secondary | ICD-10-CM

## 2020-12-19 DIAGNOSIS — E7800 Pure hypercholesterolemia, unspecified: Secondary | ICD-10-CM

## 2020-12-19 NOTE — Patient Instructions (Addendum)
Ask for Dr Janese Banks  If Dr. [name] has to give you up.    Return for early morning labs to rule out adrenal insufficiency   Start a walking program daily to get your muscle strength  Back  HIGH PROTEIN DIET :  builds muscle   You are encouraged to use the salt shaker as tolrated

## 2020-12-19 NOTE — Progress Notes (Signed)
Subjective:  Patient ID: Tracie Morris, female    DOB: March 23, 1956  Age: 65 y.o. MRN: [id number]  CC: The primary encounter diagnosis was Dizziness. Diagnoses of Pure hypercholesterolemia and Orthostasis were also pertinent to this visit.  HPI Tracie Morris presents for follow up on recent labs requested to investigate complaints of weakness, fatigue,  Flushing and light headedness following a procedure in December.   This visit occurred during the SARS-CoV-2 public health emergency.  Safety protocols were in place, including screening questions prior to the visit, additional usage of staff PPE, and extensive cleaning of exam room while observing appropriate contact time as indicated for disinfecting solutions.  Brief history: Angiography was done last month which showed stent thrombosis of the left common iliac artery with large amount of thrombus.  Dr Fletcher Anon  performed successful mechanical thrombectomy and balloon angioplasty of the left common iliac artery.  It was complicated by embolization into the internal iliac artery which necessitated treatment with mechanical thrombectomy.  In addition, there was embolization to the left mid SFA which was treated with mechanical thrombectomy and self-expanding stent placement.  She was treated with Aggrastat infusion for 18 hours after the procedure.  The procedure was associated with significant blood loss due to multiple rounds of mechanical thrombectomy performed.  Hemoglobin dropped from 17-12 .  Still has one clot that needs to be addressed In  future procedures.   Taking asa and plavix since procedure    Since her procedure she has felt weak ,  With flushing and dizzines.  Her most  recent covid exposure was the week after Christmas   [place] HX:  Has had a rocky road since  going through  In patient rehab for alcohol detox two years ago:  Significant  Weight loss,  Muscle loss and  one episode of prolonged fever lasting a week,  Etiology  was never determined.  Several weeks of diarrhea that resolved spontaneously after a diagnostic colonscopy     Repeat CBC done last week was normal. ECHO post procedure showed a normal EF.  She has quit smoking and gained weight   She was evaluated today with orthostatic vital signs and noted light headedness  with position change from sitting to standing accompanied by a drop in systolic from 606 to 301 and pulse jump from 69 to 84 .  She admits that she does not drink nearly enough water daily,  Averages 2  32 ounces daily .  Avoids salts   Using colustrum prescribed by [name] for [facility]   Has an adrenal mass ; has follow up with Endocrinologist Dr Benjiman Core    Outpatient Medications Prior to Visit  Medication Sig Dispense Refill  . aspirin EC 81 MG tablet Take 1 tablet (81 mg total) by mouth daily. Swallow whole.    . clopidogrel (PLAVIX) 75 MG tablet TAKE 1 TABLET BY MOUTH ONCE DAILY 90 tablet 2  . Colostrum 500 MG CAPS Take 500 mg by mouth daily.     . Glycerin-Hypromellose-PEG 400 (DRY EYE RELIEF DROPS) 0.2-0.2-1 % SOLN Place 1 drop into both eyes daily as needed (Dry eye).    Marland Kitchen levothyroxine (SYNTHROID) 100 MCG tablet Take 1 tablet (100 mcg total) by mouth daily before breakfast. 60 tablet 3  . Multiple Vitamin (MULTIVITAMIN WITH MINERALS) TABS tablet Take 1 tablet by mouth daily.    . propranolol (INDERAL) 10 MG tablet TAKE 1 TABLET BY MOUTH 3 TIMES DAILY AS NEEDED FOR TREMOR 90 tablet 1  .  rosuvastatin (CRESTOR) 40 MG tablet Take 1 tablet (40 mg total) by mouth daily. 90 tablet 2  . escitalopram (LEXAPRO) 10 MG tablet Take 1 tablet (10 mg total) by mouth daily. (Patient not taking: Reported on 12/19/2020) 30 tablet 5  . varenicline (CHANTIX CONTINUING MONTH PAK) 1 MG tablet Take 1 tablet (1 mg total) by mouth 2 (two) times daily. (Patient not taking: Reported on 12/19/2020) 60 tablet 1   No facility-administered medications prior to visit.    Review of Systems;  Patient  denies headache, fevers, malaise, unintentional weight loss, skin rash, eye pain, sinus congestion and sinus pain, sore throat, dysphagia,  hemoptysis , cough, dyspnea, wheezing, chest pain, palpitations, orthopnea, edema, abdominal pain, nausea, melena, diarrhea, constipation, flank pain, dysuria, hematuria, urinary  Frequency, nocturia, numbness, tingling, seizures,  Focal weakness, Loss of consciousness,  Tremor, insomnia, depression, anxiety, and suicidal ideation.      Objective:  BP 134/76 (BP Location: Left Arm, Patient Position: Sitting, Cuff Size: Normal)   Pulse 79   Temp 98.4 F (36.9 C) (Oral)   Resp 14   Ht 5\' 8"  (1.727 m)   Wt 112 lb 3.2 oz (50.9 kg)   SpO2 98%   BMI 17.06 kg/m   BP Readings from Last 3 Encounters:  12/19/20 134/76  10/27/20 140/80  10/11/20 128/70    Wt Readings from Last 3 Encounters:  12/19/20 112 lb 3.2 oz (50.9 kg)  10/27/20 110 lb (49.9 kg)  10/11/20 102 lb 2 oz (46.3 kg)    General appearance: alert, cooperative and appears stated age Ears: normal TM's and external ear canals both ears Throat: lips, mucosa, and tongue normal; teeth and gums normal Neck: no adenopathy, no carotid bruit, supple, symmetrical, trachea midline and thyroid not enlarged, symmetric, no tenderness/mass/nodules Back: symmetric, no curvature. ROM normal. No CVA tenderness. Lungs: clear to auscultation bilaterally Heart: regular rate and rhythm, S1, S2 normal, no murmur, click, rub or gallop Abdomen: soft, non-tender; bowel sounds normal; no masses,  no organomegaly Pulses: 2+ and symmetric Skin: Skin color, texture, turgor normal. No rashes or lesions Lymph nodes: Cervical, supraclavicular, and axillary nodes normal.  Lab Results  Component Value Date   HGBA1C 4.2 (L) 12/22/2018    Lab Results  Component Value Date   CREATININE 0.63 10/06/2020   CREATININE 0.75 10/04/2020   CREATININE 0.62 08/30/2020    Lab Results  Component Value Date   WBC 10.7 (H)  12/16/2020   HGB 15.3 (H) 12/16/2020   HCT 43.8 12/16/2020   PLT 267 12/16/2020   GLUCOSE 104 (H) 10/06/2020   CHOL 196 02/29/2020   TRIG 96 02/29/2020   HDL 46 02/29/2020   LDLCALC 133 (H) 02/29/2020   ALT 10 07/29/2020   AST 17 07/29/2020   NA 142 10/06/2020   K 4.0 10/06/2020   CL 109 10/06/2020   CREATININE 0.63 10/06/2020   BUN 7 (L) 10/06/2020   CO2 26 10/06/2020   TSH 0.03 (L) 05/20/2020   INR 1.0 11/05/2016   HGBA1C 4.2 (L) 12/22/2018   MICROALBUR 0.6 01/11/2014    No results found.  Assessment & Plan:   Problem List Items Addressed This Visit      Unprioritized   Hyperlipidemia   Relevant Orders   TSH   Comprehensive metabolic panel   Lipid panel   Orthostasis    Anemia ruled out.  Given her prolonged vascular procedure for thrombectomy and embolization, ddx may include adrenal insufficiency.  Will check an am cortisol ,  have her liberalize her salt intake and increase her water intake as well        Other Visit Diagnoses    Dizziness    -  Primary   Relevant Orders   Cortisol   Vitamin B12      I have discontinued Tracie Morris's escitalopram and varenicline. I am also having her maintain her multivitamin with minerals, Colostrum, levothyroxine, propranolol, aspirin EC, Dry Eye Relief Drops, clopidogrel, and rosuvastatin.  No orders of the defined types were placed in this encounter.   Medications Discontinued During This Encounter  Medication Reason  . escitalopram (LEXAPRO) 10 MG tablet   . varenicline (CHANTIX CONTINUING MONTH PAK) 1 MG tablet     Follow-up: No follow-ups on file.   [name], MD

## 2020-12-20 DIAGNOSIS — I951 Orthostatic hypotension: Secondary | ICD-10-CM | POA: Insufficient documentation

## 2020-12-20 NOTE — Assessment & Plan Note (Signed)
Anemia ruled out.  Given her prolonged vascular procedure for thrombectomy and embolization, ddx may include adrenal insufficiency.  Will check an am cortisol , have her liberalize her salt intake and increase her water intake as well

## 2020-12-22 ENCOUNTER — Other Ambulatory Visit (INDEPENDENT_AMBULATORY_CARE_PROVIDER_SITE_OTHER): Payer: Medicare PPO

## 2020-12-22 ENCOUNTER — Other Ambulatory Visit: Payer: Self-pay | Admitting: Internal Medicine

## 2020-12-22 ENCOUNTER — Other Ambulatory Visit: Payer: Self-pay

## 2020-12-22 DIAGNOSIS — R42 Dizziness and giddiness: Secondary | ICD-10-CM

## 2020-12-22 DIAGNOSIS — E7800 Pure hypercholesterolemia, unspecified: Secondary | ICD-10-CM | POA: Diagnosis not present

## 2020-12-22 LAB — LIPID PANEL
Cholesterol: 119 mg/dL (ref 0–200)
HDL: 49.2 mg/dL (ref >39.00)
LDL Cholesterol: 56 mg/dL (ref 0–99)
NonHDL: 69.64
Total CHOL/HDL Ratio: 2
Triglycerides: 69 mg/dL (ref 0.0–149.0)
VLDL: 13.8 mg/dL (ref 0.0–40.0)

## 2020-12-22 LAB — COMPREHENSIVE METABOLIC PANEL
ALT: 13 U/L (ref 0–35)
AST: 18 U/L (ref 0–37)
Albumin: 4 g/dL (ref 3.5–5.2)
Alkaline Phosphatase: 94 U/L (ref 39–117)
BUN: 13 mg/dL (ref 6–23)
CO2: 29 mEq/L (ref 19–32)
Calcium: 9.6 mg/dL (ref 8.4–10.5)
Chloride: 106 mEq/L (ref 96–112)
Creatinine, Ser: 0.79 mg/dL (ref 0.40–1.20)
GFR: 78.46 mL/min (ref >60.00)
Glucose, Bld: 98 mg/dL (ref 70–99)
Potassium: 4.4 mEq/L (ref 3.5–5.1)
Sodium: 142 mEq/L (ref 135–145)
Total Bilirubin: 0.4 mg/dL (ref 0.2–1.2)
Total Protein: 6.8 g/dL (ref 6.0–8.3)

## 2020-12-22 LAB — TSH: TSH: 0.11 u[IU]/mL — ABNORMAL LOW (ref 0.35–4.50)

## 2020-12-22 LAB — CORTISOL: Cortisol, Plasma: 20.4 ug/dL

## 2020-12-22 LAB — VITAMIN B12: Vitamin B-12: 408 pg/mL (ref 211–911)

## 2020-12-22 MED ORDER — LEVOTHYROXINE SODIUM 88 MCG PO TABS: 88.0000 ug | ORAL_TABLET | Freq: Every day | ORAL | 1 refills | Status: DC

## 2020-12-27 ENCOUNTER — Other Ambulatory Visit: Payer: Self-pay | Admitting: Internal Medicine

## 2021-01-03 DIAGNOSIS — L905 Scar conditions and fibrosis of skin: Secondary | ICD-10-CM | POA: Diagnosis not present

## 2021-01-03 DIAGNOSIS — Z85828 Personal history of other malignant neoplasm of skin: Secondary | ICD-10-CM | POA: Diagnosis not present

## 2021-01-11 ENCOUNTER — Ambulatory Visit: Payer: Medicare PPO | Admitting: Dermatology

## 2021-01-31 ENCOUNTER — Ambulatory Visit (INDEPENDENT_AMBULATORY_CARE_PROVIDER_SITE_OTHER): Payer: Medicare PPO

## 2021-01-31 ENCOUNTER — Other Ambulatory Visit: Payer: Self-pay

## 2021-01-31 ENCOUNTER — Encounter: Payer: Self-pay | Admitting: Cardiovascular Disease

## 2021-01-31 ENCOUNTER — Ambulatory Visit: Payer: Medicare PPO | Admitting: Cardiovascular Disease

## 2021-01-31 VITALS — BP 140/76 | HR 79 | Ht 67.5 in | Wt 113.5 lb

## 2021-01-31 DIAGNOSIS — I739 Peripheral vascular disease, unspecified: Secondary | ICD-10-CM

## 2021-01-31 DIAGNOSIS — I779 Disorder of arteries and arterioles, unspecified: Secondary | ICD-10-CM

## 2021-01-31 DIAGNOSIS — Z720 Tobacco use: Secondary | ICD-10-CM | POA: Diagnosis not present

## 2021-01-31 DIAGNOSIS — Z95828 Presence of other vascular implants and grafts: Secondary | ICD-10-CM | POA: Diagnosis not present

## 2021-01-31 DIAGNOSIS — E785 Hyperlipidemia, unspecified: Secondary | ICD-10-CM

## 2021-01-31 NOTE — Patient Instructions (Signed)

## 2021-01-31 NOTE — Progress Notes (Signed)
aor 

## 2021-01-31 NOTE — Progress Notes (Signed)
Cardiology Office Note   Date:  01/31/2021   ID:  Tracie Morris, DOB Oct 22, 1956, MRN [id number]  PCP:  [name], MD  Cardiologist:   [name], MD   Chief Complaint  Patient presents with  . Other    F/u Lower extremity US/ABI. Meds reviewed verbally with pt.      History of Present Illness: Tracie Morris is a 65 y.o. female who presents for a followup visit regarding peripheral arterial disease.  She is status post left common iliac artery stent placement in November of 2014 for significant claudication with subsequent covered stent placement in 2017 for in situ thrombosis with subtotal occlusion. She was diagnosed with hereditary hemochromatosis which might have been the trigger for very late stent thrombosis.  Other Medical history include  tobacco use and hyperlipidemia   Previous CT scan of the chest did show calcifications in the coronary arteries. 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  She was seen last year for severe left leg claudication with rest pain that started after she held Plavix  before colonoscopy.  Vascular testing confirmed occlusion of the left common iliac stent with significant drop in ABI. Angiography was done in November 2021 which showed stent thrombosis of the left common iliac artery with large amount of thrombus.  I performed successful mechanical thrombectomy and balloon angioplasty of the left common iliac artery.  It was complicated by embolization into the internal iliac artery which necessitated treatment with mechanical thrombectomy.  In addition, there was embolization to the left mid SFA which was treated with mechanical thrombectomy and self-expanding stent placement.  She was treated with Aggrastat infusion for 18 hours after the procedure.  She had an echocardiogram done in November that after the procedure due to fatigue and shortness of breath.  It showed normal LV systolic function with no  evidence of pulmonary hypertension.  There was mild to moderate mitral regurgitation. She quit smoking after her most recent procedure.  She has been doing well with no recent chest pain, shortness of breath or claudication.  She had COVID-19 infection in January but recovered.  Past Medical History:  Diagnosis Date  . Arthritis   . Basal cell carcinoma 03/29/2014   L lower abdomen  . Basal cell carcinoma 01/18/2020   central nasal tip, [facility] at [facility]  . Breast mass 09/05/2020   right breast mass 10 oclock  . Carotid artery occlusion   . Hemochromatosis   . Hyperlipidemia   . Hypocalcemia   . hypothyroidism 2007   acquired, secondary to surgery for thyroid Ca  . Hypothyroidism   . Migraine    improved since thyroid surgery, 2 years  . multinodular goiter 2007   s/p thyroidectomy  . 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 Repeat angiography in November of 2014 showed 50% distal aortic stenosis extending into the left common iliac artery which had 60-70% ostial stenosis. No other significant disease. I placed on balloon expandable stent into  left common iliac artery extending slightly into the distal aorta  . PONV (postoperative nausea and vomiting)   . Squamous cell carcinoma of skin 06/23/2018   R nasal tip/in situ, txt'd  with [facility]     Past Surgical History:  Procedure Laterality Date  . ABDOMINAL AORTAGRAM N/A 10/14/2013   Procedure: ABDOMINAL [name];  Surgeon: Wellington Hampshire, MD;  Location:  [facility];  Service: Cardiovascular;  Laterality: N/A;  . ABDOMINAL AORTOGRAM W/LOWER EXTREMITY N/A 10/05/2020   Procedure: ABDOMINAL AORTOGRAM W/LOWER EXTREMITY;  Surgeon: Wellington Hampshire, MD;  Location: [place] [facility];  Service: Cardiovascular;  Laterality: N/A;  . ANGIOPLASTY ILLIAC ARTERY    . CESAREAN SECTION    . COLONOSCOPY WITH PROPOFOL N/A 09/22/2020   Procedure:  COLONOSCOPY WITH PROPOFOL;  Surgeon: Lin Landsman, MD;  Location: [facility];  Service: Endoscopy;  Laterality: N/A;  . NOSE SURGERY    . PERIPHERAL VASCULAR CATHETERIZATION N/A 11/14/2016   Procedure: Abdominal Aortogram;  Surgeon: Wellington Hampshire, MD;  Location: [place] [facility];  Service: Cardiovascular;  Laterality: N/A;  . PERIPHERAL VASCULAR CATHETERIZATION Bilateral 11/14/2016   Procedure: Lower Extremity Angiography;  Surgeon: Wellington Hampshire, MD;  Location: [place] [facility];  Service: Cardiovascular;  Laterality: Bilateral;  limited iliacs only  . PERIPHERAL VASCULAR CATHETERIZATION Left 11/14/2016   Procedure: Peripheral Vascular Intervention;  Surgeon: Wellington Hampshire, MD;  Location: [place] [facility];  Service: Cardiovascular;  Laterality: Left;  common iliac  . PERIPHERAL VASCULAR INTERVENTION  10/05/2020   Procedure: PERIPHERAL VASCULAR INTERVENTION;  Surgeon: Wellington Hampshire, MD;  Location: [place] [facility];  Service: Cardiovascular;;  left common iliac   . PERIPHERAL VASCULAR THROMBECTOMY  10/05/2020   Procedure: PERIPHERAL VASCULAR THROMBECTOMY;  Surgeon: Wellington Hampshire, MD;  Location: [place] [facility];  Service: Cardiovascular;;  Mechanical - Left common iliac, left internal iliac, and left SFA  . POLYPECTOMY  09/22/2020   Procedure: POLYPECTOMY;  Surgeon: Lin Landsman, MD;  Location: [facility];  Service: Endoscopy;;  . SKIN CANCER EXCISION    . THYROID SURGERY  2007   secondary to thyroid cancer     Current Outpatient Medications  Medication Sig Dispense Refill  . aspirin EC 81 MG tablet Take 1 tablet (81 mg total) by mouth daily. Swallow whole.    . clopidogrel (PLAVIX) 75 MG tablet TAKE 1 TABLET BY MOUTH ONCE DAILY 90 tablet 2  . Colostrum 500 MG CAPS Take 500 mg by mouth daily.     . Glycerin-Hypromellose-PEG 400 (DRY EYE RELIEF DROPS) 0.2-0.2-1 % SOLN Place 1 drop into both eyes daily as needed (Dry eye).    Marland Kitchen  levothyroxine (SYNTHROID) 88 MCG tablet TAKE 1 TABLET BY MOUTH ONCE DAILY ON AN EMPTY STOMACH. WAIT 30 MINUTES BEFORE TAKING OTHER MEDS. 45 tablet 1  . Multiple Vitamin (MULTIVITAMIN WITH MINERALS) TABS tablet Take 1 tablet by mouth daily.    . propranolol (INDERAL) 10 MG tablet TAKE 1 TABLET BY MOUTH 3 TIMES DAILY AS NEEDED FOR TREMOR 90 tablet 1  . rosuvastatin (CRESTOR) 40 MG tablet Take 1 tablet (40 mg total) by mouth daily. 90 tablet 2   No current facility-administered medications for this visit.    Allergies:   Atorvastatin, Darvocet [propoxyphene n-acetaminophen], Demerol, Methocarbamol, Mirtazapine, Primidone, and Ambien [zolpidem tartrate]    Social History:  The patient  reports that she quit smoking about 9 years ago. Her smoking use included cigarettes. She has a 20.00 pack-year smoking history. She has never used smokeless tobacco. She reports previous alcohol use. She reports that she does not use drugs.   Family History:  The patient's family history includes Alzheimer's disease in her father; Coronary artery disease in her maternal grandmother; Coronary artery disease (age of onset: 25) in her paternal grandmother; Heart disease (age of onset: 2) in her maternal grandmother;  Heart disease (age of onset: 61) in her paternal grandmother; Stroke in her mother.    ROS:  Please see the history of present illness.   Otherwise, review of systems are positive for none.   All other systems are reviewed and negative.    PHYSICAL EXAM: VS:  BP 140/76 (BP Location: Left Arm, Patient Position: Sitting, Cuff Size: Normal)   Pulse 79   Ht 5' 7.5" (1.715 m)   Wt 113 lb 8 oz (51.5 kg)   SpO2 98%   BMI 17.51 kg/m  , BMI Body mass index is 17.51 kg/m. GEN: Well nourished, well developed, in no acute distress  HEENT: normal  Neck: no JVD, carotid bruits, or masses Cardiac: RRR; no murmurs, rubs, or gallops,no edema  Respiratory:  clear to auscultation bilaterally, normal work of  breathing GI: soft, nontender, nondistended, + BS MS: no deformity or atrophy  Skin: warm and dry, no rash Neuro:  Strength and sensation are intact Psych: euthymic mood, full affect Vascular: Posterior tibial and dorsalis pedis pulses palpable on the right.  On the left, the posterior tibial is palpable.   EKG:  EKG is not ordered today.    Recent Labs: 12/16/2020: Hemoglobin 15.3; Platelets 267 12/22/2020: ALT 13; BUN 13; Creatinine, Ser 0.79; Potassium 4.4; Sodium 142; TSH 0.11    Lipid Panel    Component Value Date/Time   CHOL 119 12/22/2020 0803   CHOL 196 02/29/2020 1206   TRIG 69.0 12/22/2020 0803   HDL 49.20 12/22/2020 0803   HDL 46 02/29/2020 1206   CHOLHDL 2 12/22/2020 0803   VLDL 13.8 12/22/2020 0803   LDLCALC 56 12/22/2020 0803   LDLCALC 133 (H) 02/29/2020 1206      Wt Readings from Last 3 Encounters:  01/31/21 113 lb 8 oz (51.5 kg)  12/19/20 112 lb 3.2 oz (50.9 kg)  10/27/20 110 lb (49.9 kg)        ASSESSMENT AND PLAN:  1.  Peripheral arterial disease: Status post  mechanical thrombectomy and balloon angioplasty of left common iliac artery stent thrombosis.  Also required self-expanding stent placement to the left SFA due to embolization.  She underwent repeat vascular studies today which showed improvement in ABI on the left side to normal.  Duplex showed patent left common iliac artery stent with improved velocities and also patent left SFA stent with normal velocities. Continue dual antiplatelet therapy.  She does have borderline significant disease affecting the right common iliac artery but her pulses are normal on the right side and currently with no claudication.  2. Hyperlipidemia: Rosuvastatin was increased last year to 40 mg daily.  Recent lipid profile showed significant improvement in LDL down to 56.  3. Carotid disease: This was mild on recent carotid Dopple in May of last year.  4. Coronary calcifications: No anginal symptoms. Continue  aggressive treatment of risk factors.  5. Tobacco use: She reports not smoking since November.    Disposition: Follow-up with me in 6 months.  Signed,  [name], MD  01/31/2021 2:53 PM    [place]

## 2021-02-07 DIAGNOSIS — Z85828 Personal history of other malignant neoplasm of skin: Secondary | ICD-10-CM | POA: Diagnosis not present

## 2021-02-16 ENCOUNTER — Inpatient Hospital Stay: Payer: Medicare PPO | Attending: Oncology

## 2021-02-16 DIAGNOSIS — D72829 Elevated white blood cell count, unspecified: Secondary | ICD-10-CM | POA: Insufficient documentation

## 2021-02-16 DIAGNOSIS — Z87891 Personal history of nicotine dependence: Secondary | ICD-10-CM | POA: Diagnosis not present

## 2021-02-16 DIAGNOSIS — Z7982 Long term (current) use of aspirin: Secondary | ICD-10-CM | POA: Diagnosis not present

## 2021-02-16 DIAGNOSIS — Z7902 Long term (current) use of antithrombotics/antiplatelets: Secondary | ICD-10-CM | POA: Diagnosis not present

## 2021-02-16 DIAGNOSIS — I739 Peripheral vascular disease, unspecified: Secondary | ICD-10-CM | POA: Diagnosis not present

## 2021-02-16 DIAGNOSIS — E039 Hypothyroidism, unspecified: Secondary | ICD-10-CM | POA: Insufficient documentation

## 2021-02-16 DIAGNOSIS — Z79899 Other long term (current) drug therapy: Secondary | ICD-10-CM | POA: Insufficient documentation

## 2021-02-16 LAB — CBC WITH DIFFERENTIAL/PLATELET
Abs Immature Granulocytes: 0.03 10*3/uL (ref 0.00–0.07)
Basophils Absolute: 0.1 10*3/uL (ref 0.0–0.1)
Basophils Relative: 1 %
Eosinophils Absolute: 0.4 10*3/uL (ref 0.0–0.5)
Eosinophils Relative: 3 %
HCT: 45.6 % (ref 36.0–46.0)
Hemoglobin: 16 g/dL — ABNORMAL HIGH (ref 12.0–15.0)
Immature Granulocytes: 0 %
Lymphocytes Relative: 37 %
Lymphs Abs: 4.5 10*3/uL — ABNORMAL HIGH (ref 0.7–4.0)
MCH: 33.7 pg (ref 26.0–34.0)
MCHC: 35.1 g/dL (ref 30.0–36.0)
MCV: 96 fL (ref 80.0–100.0)
Monocytes Absolute: 0.9 10*3/uL (ref 0.1–1.0)
Monocytes Relative: 7 %
Neutro Abs: 6.1 10*3/uL (ref 1.7–7.7)
Neutrophils Relative %: 52 %
Platelets: 237 10*3/uL (ref 150–400)
RBC: 4.75 MIL/uL (ref 3.87–5.11)
RDW: 13.2 % (ref 11.5–15.5)
WBC: 12 10*3/uL — ABNORMAL HIGH (ref 4.0–10.5)
nRBC: 0 % (ref 0.0–0.2)

## 2021-02-16 LAB — IRON AND TIBC
Iron: 212 ug/dL — ABNORMAL HIGH (ref 28–170)
Saturation Ratios: 49 % — ABNORMAL HIGH (ref 10.4–31.8)
TIBC: 430 ug/dL (ref 250–450)
UIBC: 218 ug/dL

## 2021-02-16 LAB — FERRITIN: Ferritin: 24 ng/mL (ref 11–307)

## 2021-02-17 LAB — AFP TUMOR MARKER: AFP, Serum, Tumor Marker: 1.8 ng/mL (ref 0.0–9.2)

## 2021-02-20 ENCOUNTER — Inpatient Hospital Stay: Payer: Medicare PPO | Admitting: Oncology

## 2021-02-20 ENCOUNTER — Encounter: Payer: Self-pay | Admitting: Oncology

## 2021-02-20 DIAGNOSIS — E039 Hypothyroidism, unspecified: Secondary | ICD-10-CM | POA: Diagnosis not present

## 2021-02-20 DIAGNOSIS — Z79899 Other long term (current) drug therapy: Secondary | ICD-10-CM | POA: Diagnosis not present

## 2021-02-20 DIAGNOSIS — Z7982 Long term (current) use of aspirin: Secondary | ICD-10-CM | POA: Diagnosis not present

## 2021-02-20 DIAGNOSIS — D72829 Elevated white blood cell count, unspecified: Secondary | ICD-10-CM | POA: Diagnosis not present

## 2021-02-20 DIAGNOSIS — Z87891 Personal history of nicotine dependence: Secondary | ICD-10-CM | POA: Diagnosis not present

## 2021-02-20 DIAGNOSIS — Z7902 Long term (current) use of antithrombotics/antiplatelets: Secondary | ICD-10-CM | POA: Diagnosis not present

## 2021-02-20 DIAGNOSIS — I739 Peripheral vascular disease, unspecified: Secondary | ICD-10-CM | POA: Diagnosis not present

## 2021-02-20 NOTE — Progress Notes (Signed)
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  INTERVAL HISTORY: Patient returns to clinic today for routine 20-month evaluation and repeat laboratory work.  She was last seen in clinic on 08/19/2020.  In the interim, she was seen by GI for diarrhea and abdominal pain.  She had stool studies to rule out infection and pancreatic fecal elastase levels were normal.  She had a colonoscopy on 09/22/2020 for evaluation which showed 3 polyps measuring 3 to 7 mm in size and scarred melanotic mucosa in the terminal ileum.  Otherwise unremarkable.  She is followed by cardiology for peripheral artery disease s/p left common iliac artery stent placement in November 2014 for claudication with subsequent stent placement in 2017 for in situ thrombus with subtotal occlusion.  She was diagnosed with hereditary hemochromatosis which was likely contributing.  She had a peripheral left common iliac arterial stent placed on 10/06/2020 with significant blood loss.  She had an echocardiogram completed in November 2021 due to fatigue and shortness of breath.  It showed mild to moderate mitral regurgitation and normal LV systolic function with no evidence of pulmonary hypertension.  Repeat vascular study showed improvement of ABI in the left side to normal.  She quit smoking in November 2021.  Today, she presents for follow-up.  Patient states she is doing better since her vascular procedure in November.  She had Covid in February 2022 and she is slowly recovering from this as well.  Her fatigue and energy level have improved  She has no neurologic  complaints.  States her weight fluctuates.  When she is sick, her weight tends to be down a few pounds but she "eats like a horse normally".  She started back smoking in January 2022 due to stress.  She denies any fevers.  She has no chest pain, shortness of breath, cough, or hemoptysis.  She has no abdominal pain and denies any nausea, vomiting, or constipation.  She has no urinary complaints.  Patient offers no further specific complaints today.  REVIEW OF SYSTEMS:   Review of Systems  Constitutional: Positive for malaise/fatigue. Negative for fever and weight loss.  HENT: Negative.   Respiratory: Negative.  Negative for cough and shortness of breath.   Cardiovascular: Negative.  Negative for chest pain and leg swelling.  Gastrointestinal: Negative for abdominal pain and diarrhea.  Genitourinary: Negative.  Negative for dysuria.  Musculoskeletal: Negative.  Negative for back pain.  Skin: Negative.  Negative for rash.  Neurological: Negative.  Negative for tremors, focal weakness, weakness and headaches.  Psychiatric/Behavioral: Negative.  The patient is not nervous/anxious.     As per HPI. Otherwise, a complete review of systems is negative.  PAST MEDICAL HISTORY: Past Medical History:  Diagnosis Date  . Arthritis   . Basal cell carcinoma 03/29/2014   L lower abdomen  . Basal cell carcinoma 01/18/2020   central nasal tip, [facility] at [facility]  . Breast mass 09/05/2020   right breast mass 10 oclock  . Carotid artery occlusion   . Hemochromatosis   . Hyperlipidemia   . Hypocalcemia   . hypothyroidism 2007   acquired, secondary to surgery for thyroid Ca  . Hypothyroidism   . Migraine  improved since thyroid surgery, 2 years  . multinodular goiter 2007   s/p thyroidectomy  . 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 Repeat angiography in November of 2014 showed 50% distal aortic  stenosis extending into the left common iliac artery which had 60-70% ostial stenosis. No other significant disease. I placed on balloon expandable stent into  left common iliac artery extending slightly into the distal aorta  . PONV (postoperative nausea and vomiting)   . Squamous cell carcinoma of skin 06/23/2018   R nasal tip/in situ, txt'd  with [facility]     PAST SURGICAL HISTORY: Past Surgical History:  Procedure Laterality Date  . ABDOMINAL AORTAGRAM N/A 10/14/2013   Procedure: ABDOMINAL [name];  Surgeon: Wellington Hampshire, MD;  Location: [facility];  Service: Cardiovascular;  Laterality: N/A;  . ABDOMINAL AORTOGRAM W/LOWER EXTREMITY N/A 10/05/2020   Procedure: ABDOMINAL AORTOGRAM W/LOWER EXTREMITY;  Surgeon: Wellington Hampshire, MD;  Location: [place];  Service: Cardiovascular;  Laterality: N/A;  . ANGIOPLASTY ILLIAC ARTERY    . CESAREAN SECTION    . COLONOSCOPY WITH PROPOFOL N/A 09/22/2020   Procedure: COLONOSCOPY WITH PROPOFOL;  Surgeon: Lin Landsman, MD;  Location: [facility];  Service: Endoscopy;  Laterality: N/A;  . NOSE SURGERY    . PERIPHERAL VASCULAR CATHETERIZATION N/A 11/14/2016   Procedure: Abdominal Aortogram;  Surgeon: Wellington Hampshire, MD;  Location: [facility];  Service: Cardiovascular;  Laterality: N/A;  . PERIPHERAL VASCULAR CATHETERIZATION Bilateral 11/14/2016   Procedure: Lower Extremity Angiography;  Surgeon: Wellington Hampshire, MD;  Location: [facility];  Service: Cardiovascular;  Laterality: Bilateral;  limited iliacs only  . PERIPHERAL VASCULAR CATHETERIZATION Left 11/14/2016   Procedure: Peripheral Vascular Intervention;  Surgeon: Wellington Hampshire, MD;  Location: [facility];  Service: Cardiovascular;  Laterality: Left;  common iliac  . PERIPHERAL VASCULAR INTERVENTION  10/05/2020   Procedure: PERIPHERAL VASCULAR INTERVENTION;  Surgeon: Wellington Hampshire, MD;  Location: [facility];  Service: Cardiovascular;;   left common iliac   . PERIPHERAL VASCULAR THROMBECTOMY  10/05/2020   Procedure: PERIPHERAL VASCULAR THROMBECTOMY;  Surgeon: Wellington Hampshire, MD;  Location: [facility];  Service: Cardiovascular;;  Mechanical - Left common iliac, left internal iliac, and left SFA  . POLYPECTOMY  09/22/2020   Procedure: POLYPECTOMY;  Surgeon: Lin Landsman, MD;  Location: [facility];  Service: Endoscopy;;  . SKIN CANCER EXCISION    . THYROID SURGERY  2007   secondary to thyroid cancer    FAMILY HISTORY: Family History  Problem Relation Age of Onset  . Alzheimer's disease Father   . Stroke Mother   . Coronary artery disease Maternal Grandmother   . Heart disease Maternal Grandmother 70  . Coronary artery disease Paternal Grandmother 81  . Heart disease Paternal Grandmother 45  . Breast cancer Neg Hx     ADVANCED DIRECTIVES (Y/N):  N  HEALTH MAINTENANCE: Social History   Tobacco Use  . Smoking status: Former Smoker    Packs/day: 1.00    Years: 20.00    Pack years: 20.00    Types: Cigarettes    Quit date: 10/28/2011    Years since quitting: 9.3  . Smokeless tobacco: Never Used  Vaping Use  . Vaping Use: Former  Substance Use Topics  . Alcohol use: Not Currently  . Drug use: No  Colonoscopy:  PAP:  Bone density:  Lipid panel:  Allergies  Allergen Reactions  . Atorvastatin Other (See Comments)    Myalgias   . Darvocet [Propoxyphene N-Acetaminophen] Nausea And Vomiting  . Demerol Nausea And Vomiting  . Methocarbamol Other (See Comments)    UNKNOWN  . Mirtazapine     Sleeplessness, nausea   . Primidone     Severe insomnia, myoclonus and parathesias   . Ambien [Zolpidem Tartrate] Palpitations    Anxiety , trembling.    Current Outpatient Medications  Medication Sig Dispense Refill  . aspirin EC 81 MG tablet Take 1 tablet (81 mg total) by mouth daily. Swallow whole.    . clopidogrel (PLAVIX) 75 MG tablet TAKE 1 TABLET BY MOUTH ONCE DAILY 90 tablet 2   . Colostrum 500 MG CAPS Take 500 mg by mouth daily.     . Glycerin-Hypromellose-PEG 400 (DRY EYE RELIEF DROPS) 0.2-0.2-1 % SOLN Place 1 drop into both eyes daily as needed (Dry eye).    Marland Kitchen levothyroxine (SYNTHROID) 88 MCG tablet TAKE 1 TABLET BY MOUTH ONCE DAILY ON AN EMPTY STOMACH. WAIT 30 MINUTES BEFORE TAKING OTHER MEDS. 45 tablet 1  . Multiple Vitamin (MULTIVITAMIN WITH MINERALS) TABS tablet Take 1 tablet by mouth daily.    . propranolol (INDERAL) 10 MG tablet TAKE 1 TABLET BY MOUTH 3 TIMES DAILY AS NEEDED FOR TREMOR 90 tablet 1  . rosuvastatin (CRESTOR) 40 MG tablet Take 1 tablet (40 mg total) by mouth daily. 90 tablet 2   No current facility-administered medications for this visit.    OBJECTIVE: There were no vitals filed for this visit.   There is no height or weight on file to calculate BMI.    ECOG FS:0 - Asymptomatic  Physical Exam Constitutional:      Appearance: Normal appearance.  HENT:     Head: Normocephalic and atraumatic.  Eyes:     Pupils: Pupils are equal, round, and reactive to light.  Cardiovascular:     Rate and Rhythm: Normal rate and regular rhythm.     Heart sounds: Normal heart sounds. No murmur heard.   Pulmonary:     Effort: Pulmonary effort is normal.     Breath sounds: Normal breath sounds. No wheezing.  Abdominal:     General: Bowel sounds are normal. There is no distension.     Palpations: Abdomen is soft.     Tenderness: There is no abdominal tenderness.  Musculoskeletal:        General: Normal range of motion.     Cervical back: Normal range of motion.  Skin:    General: Skin is warm and dry.     Findings: No rash.  Neurological:     Mental Status: She is alert and oriented to person, place, and time.  Psychiatric:        Judgment: Judgment normal.     LAB RESULTS:  Lab Results  Component Value Date   NA 142 12/22/2020   K 4.4 12/22/2020   CL 106 12/22/2020   CO2 29 12/22/2020   GLUCOSE 98 12/22/2020   BUN 13 12/22/2020    CREATININE 0.79 12/22/2020   CALCIUM 9.6 12/22/2020   PROT 6.8 12/22/2020   ALBUMIN 4.0 12/22/2020   AST 18 12/22/2020   ALT 13 12/22/2020   ALKPHOS 94 12/22/2020   BILITOT 0.4 12/22/2020   GFRNONAA >60 10/06/2020   GFRAA 109 08/30/2020    Lab Results  Component Value Date   WBC 12.0 (H) 02/16/2021  NEUTROABS 6.1 02/16/2021   HGB 16.0 (H) 02/16/2021   HCT 45.6 02/16/2021   MCV 96.0 02/16/2021   PLT 237 02/16/2021   Lab Results  Component Value Date   IRON 212 (H) 02/16/2021   TIBC 430 02/16/2021   IRONPCTSAT 49 (H) 02/16/2021   Lab Results  Component Value Date   FERRITIN 24 02/16/2021     STUDIES: VAS [place] ABI WITH/WO TBI  Result Date: 01/31/2021 LOWER EXTREMITY DOPPLER STUDY Indications: F/U to 10/05/20 intervention. High Risk Factors: Hypertension, past history of smoking, coronary artery                    disease. Other Factors: S/p Covid + in 11/2019 and 12/2020                 Patient denies claudication.  Vascular               Left common iliac artery stents in 2014, 2017 and, Interventions:         09/2020.                        Left distal SFA stent. Comparison Study: 10/10/20 with normal right and mildly abnormal left. Performing Technologist: Pilar Jarvis RDMS, RVT, RDCS  Examination Guidelines: A complete evaluation includes at minimum, Doppler waveform signals and systolic blood pressure reading at the level of bilateral brachial, anterior tibial, and posterior tibial arteries, when vessel segments are accessible. Bilateral testing is considered an integral part of a complete examination. Photoelectric Plethysmograph (PPG) waveforms and toe systolic pressure readings are included as required and additional duplex testing as needed. Limited examinations for reoccurring indications may be performed as noted.  ABI Findings: +---------+------------------+-----+---------+--------+ Right    Rt Pressure (mmHg)IndexWaveform Comment   +---------+------------------+-----+---------+--------+ Brachial 130                    triphasic         +---------+------------------+-----+---------+--------+ ATA      157               1.21 triphasic         +---------+------------------+-----+---------+--------+ PTA      163               1.25 triphasic         +---------+------------------+-----+---------+--------+ PERO     176               1.35 triphasic         +---------+------------------+-----+---------+--------+ Great Toe119               0.92 Normal            +---------+------------------+-----+---------+--------+ +---------+------------------+-----+----------+-------+ Left     Lt Pressure (mmHg)IndexWaveform  Comment +---------+------------------+-----+----------+-------+ Brachial 126                    triphasic         +---------+------------------+-----+----------+-------+ ATA      180               1.38 triphasic         +---------+------------------+-----+----------+-------+ PTA      167               1.28 triphasic         +---------+------------------+-----+----------+-------+ PERO     147               1.13 monophasic        +---------+------------------+-----+----------+-------+  Great Toe                       Absent            +---------+------------------+-----+----------+-------+ +-------+-----------+-----------+------------+------------+ ABI/TBIToday's ABIToday's TBIPrevious ABIPrevious TBI +-------+-----------+-----------+------------+------------+ Right  1.25       0.92       1.19                     +-------+-----------+-----------+------------+------------+ Left   1.28                  0.93                     +-------+-----------+-----------+------------+------------+ Previous left-sided waveforms were monophasic. Bilateral ABIs appear increased compared to prior study on 10/05/20. Right TBIs appear increased compared to prior study on 10/05/20.   Summary: Right: Resting right ankle-brachial index is within normal range. No evidence of significant right lower extremity arterial disease. The right toe-brachial index is normal. One isolated non-compressible vessel. Left: Resting left ankle-brachial index is within normal range. No evidence of significant left lower extremity arterial disease. The left toe-brachial index is abnormal. One isolated non-compressible vessel.  *See table(s) above for measurements and observations.  Electronically signed by Quay Burow MD on 01/31/2021 at 1:37:11 PM.    Final    VAS [place] LOWER EXTREMITY ARTERIAL DUPLEX  Result Date: 01/31/2021 LOWER EXTREMITY ARTERIAL DUPLEX STUDY Indications: F/U to 09/2020 intervention. High Risk         Hyperlipidemia, past history of smoking, coronary artery Factors:          disease. Other Factors: S/p Covid + in 11/2019 and 12/2020                Patient denies claudication.  Vascular Interventions: Left common iliac stents in 2014, 2017 and, 10/05/20                         Left mid-distal left SFA stent. Current ABI:            1.25 right, 1.28 left Comparison Study: 10/10/20. Widely patient vessels and mid-distal left SFA                   stent. Monophasic waveforms throughout Performing Technologist: Pilar Jarvis RDMS, RVT, RDCS  Examination Guidelines: A complete evaluation includes B-mode imaging, spectral Doppler, color Doppler, and power Doppler as needed of all accessible portions of each vessel. Bilateral testing is considered an integral part of a complete examination. Limited examinations for reoccurring indications may be performed as noted.   +----------+--------+-----+---------------+---------+--------+ LEFT      PSV cm/sRatioStenosis       Waveform Comments +----------+--------+-----+---------------+---------+--------+ CFA Mid   204          30-49% stenosisbiphasic          +----------+--------+-----+---------------+---------+--------+ DFA       119                          triphasic         +----------+--------+-----+---------------+---------+--------+ SFA Prox  81                          biphasic          +----------+--------+-----+---------------+---------+--------+ SFA Mid   108  biphasic          +----------+--------+-----+---------------+---------+--------+ POP Prox  89                          biphasic          +----------+--------+-----+---------------+---------+--------+ POP Mid   59                          biphasic          +----------+--------+-----+---------------+---------+--------+ POP Distal65                          biphasic          +----------+--------+-----+---------------+---------+--------+ TP Trunk  75                          biphasic          +----------+--------+-----+---------------+---------+--------+  Left Stent(s): +-----------------+--------+-----------+--------+--------+ Mid to distal SFAPSV cm/sStenosis   WaveformComments +-----------------+--------+-----------+--------+--------+ Prox to Stent    111     no stenosisbiphasic         +-----------------+--------+-----------+--------+--------+ Proximal Stent   125     no stenosisbiphasic         +-----------------+--------+-----------+--------+--------+ Mid Stent        88      no stenosisbiphasic         +-----------------+--------+-----------+--------+--------+ Distal Stent     89      no stenosisbiphasic         +-----------------+--------+-----------+--------+--------+ Distal to Stent  94      no stenosisbiphasic         +-----------------+--------+-----------+--------+--------+    Summary: Left: 30-49% stenosis noted in the common femoral artery. Patent stent with no evidence of stenosis in the mid-distal SFA artery. Moderate improvement is noted compared to previous study. Change from monophasic to multiphasic waveforms c/t previous exam.  See table(s) above for measurements and  observations. Suggest follow up study in 12 months. Electronically signed by Quay Burow MD on 01/31/2021 at 1:32:07 PM.    Final    VAS US AORTA/IVC/ILIACS  Result Date: 01/31/2021 ABDOMINAL AORTA STUDY Indications: Surgery date 10/05/20. F/U vascular intervention 09/2020 Risk Factors: Hyperlipidemia, past history of smoking, coronary artery disease. Other Factors: Patient denies claudication. Vascular               Left common iliac artery stents in 2014, 2017 and, Interventions:         09/2020.  Comparison Study: 10/10/20 aorta study showed very high left common iliac artery                   velocities of 547 cm/s Performing Technologist: Pilar Jarvis RDMS, RVT, RDCS  Examination Guidelines: A complete evaluation includes B-mode imaging, spectral Doppler, color Doppler, and power Doppler as needed of all accessible portions of each vessel. Bilateral testing is considered an integral part of a complete examination. Limited examinations for reoccurring indications may be performed as noted.  Abdominal Aorta Findings: +-------------+-------+----------+----------+--------+--------+--------+ Location     AP (cm)Trans (cm)PSV (cm/s)WaveformThrombusComments +-------------+-------+----------+----------+--------+--------+--------+ Proximal     1.31   1.33      90        biphasic                 +-------------+-------+----------+----------+--------+--------+--------+ Mid          1.27  97        biphasic                 +-------------+-------+----------+----------+--------+--------+--------+ Distal       1.00             156       biphasic                 +-------------+-------+----------+----------+--------+--------+--------+ RT CIA Prox                   179       biphasic                 +-------------+-------+----------+----------+--------+--------+--------+ RT CIA Mid                    372       biphasic                  +-------------+-------+----------+----------+--------+--------+--------+ RT EIA Prox                   225       biphasic                 +-------------+-------+----------+----------+--------+--------+--------+ RT EIA Mid                    228       biphasic                 +-------------+-------+----------+----------+--------+--------+--------+ RT EIA Distal                 157       biphasic                 +-------------+-------+----------+----------+--------+--------+--------+ LT CIA Mid                    217       biphasic                 +-------------+-------+----------+----------+--------+--------+--------+ LT CIA Distal                 211       biphasic                 +-------------+-------+----------+----------+--------+--------+--------+ LT EIA Prox                   104       biphasic                 +-------------+-------+----------+----------+--------+--------+--------+ LT EIA Distal                 136       biphasic                 +-------------+-------+----------+----------+--------+--------+--------+ Multiple attempts were made to dulpicate previous proximal left common iliac artery velocities, without success. Left Stent(s): +-------------------+--------+---------------+--------+--------+ common iliac arteryPSV cm/sStenosis       WaveformComments +-------------------+--------+---------------+--------+--------+ Prox to Stent      156                    biphasic         +-------------------+--------+---------------+--------+--------+ Proximal Stent     260     50-99% stenosisbiphasic         +-------------------+--------+---------------+--------+--------+ Mid Stent          254     50-99% stenosisbiphasic         +-------------------+--------+---------------+--------+--------+  Distal Stent       215     1-49% stenosis biphasic         +-------------------+--------+---------------+--------+--------+ Distal to  Stent    217     1-49% stenosis biphasic         +-------------------+--------+---------------+--------+--------+    Summary: Abdominal Aorta: No evidence of an abdominal aortic aneurysm was visualized. The largest aortic measurement is 1.3 cm. Stenosis: +--------------------+-------------+ Location            Stenosis      +--------------------+-------------+ Right Common Iliac  >50% stenosis +--------------------+-------------+ Left Common Iliac   >50% stenosis +--------------------+-------------+ Right External Iliac>50% stenosis +--------------------+-------------+   *See table(s) above for measurements and observations. Suggest follow up study in 12 months.  Electronically signed by Quay Burow MD on 01/31/2021 at 1:33:55 PM.    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  PLAN:    1. Hereditary hemochromatosis:  -Diagnosed back in 2017 for work-up of recurrent stent placement in 2017 for in situ thrombus with subtotal occlusion br. Dr. Fletcher Anon.  -Labs from 02/16/2021 show a hemoglobin of 16.0, ferritin 24 and iron saturation of 49%.  -Goal ferritin is less than 500. -She continues colostrum OTC medication to control her iron stores. -She has not had a phlebotomy in quite some time. -RTC in 6 months with repeat labs and MD assessment.  2. PVD: -Followed by Dr. Fletcher Anon. -Currently on Plavix and aspirin. -Recently had recurrent leg pain after stopping anticoagulation for colonoscopy prompting evaluation and requiring angiography in November 2021 which showed stent thrombus of the left common iliac artery with large amounts of thrombus.  She had a mechanical thrombectomy and balloon angioplasty of the left common iliac artery which was complicated by embolization into the internal iliac artery which necessitated treatment with mechanical thrombectomy.  She also had left mid SF a which was treated with mechanical thrombectomy  and self-expanding stent placement with Aggrastat infusion for 18 hours.  She had extensive blood loss but did not require a transfusion. -States this is improving.  3.  Leukocytosis: -Appears chronic and is unchanged. -WBC from 02/16/2021 is 12.0-mainly lymphocytes.   4. Liver Lesion: -Appears to have resolved. -Labs from 02/16/2021 show an AFP level of 1.8 which is normal. -She recently had a CT scan of her pancreas due to diarrhea and abdominal pain which showed a normal liver with no surface irregularity.  No liver mass.  Normal gallbladder.  No biliary ductal dilatation.  Disposition: -RTC in 6 months with repeat labs, MD assessment and possible phlebotomy.  Greater than 50% was spent in counseling and coordination of care with this patient including but not limited to discussion of the relevant topics above (See A&P) including, but not limited to diagnosis and management of acute and chronic medical conditions.    Patient expressed understanding and was in agreement with this plan. She also understands that She can call clinic at any time with any questions, concerns, or complaints.    Jacquelin Hawking, NP   02/20/2021 8:34 AM

## 2021-02-20 NOTE — Progress Notes (Signed)
Patient denies any concerns today.  

## 2021-03-01 ENCOUNTER — Other Ambulatory Visit: Payer: Self-pay | Admitting: Internal Medicine

## 2021-03-08 ENCOUNTER — Other Ambulatory Visit: Payer: Self-pay

## 2021-03-08 ENCOUNTER — Ambulatory Visit: Payer: Medicare PPO | Admitting: Internal Medicine

## 2021-03-08 ENCOUNTER — Encounter: Payer: Self-pay | Admitting: Internal Medicine

## 2021-03-08 VITALS — BP 110/68 | HR 67 | Ht 67.5 in | Wt 114.6 lb

## 2021-03-08 DIAGNOSIS — E032 Hypothyroidism due to medicaments and other exogenous substances: Secondary | ICD-10-CM | POA: Diagnosis not present

## 2021-03-08 LAB — TSH: TSH: 0.03 u[IU]/mL — ABNORMAL LOW (ref 0.35–4.50)

## 2021-03-08 LAB — T4, FREE: Free T4: 1.48 ng/dL (ref 0.60–1.60)

## 2021-03-08 NOTE — Progress Notes (Signed)
Patient ID: Tracie Morris, female   DOB: 1956/11/24, 65 y.o.   MRN: [id number]  This visit occurred during the SARS-CoV-2 public health emergency.  Safety protocols were in place, including screening questions prior to the visit, additional usage of staff PPE, and extensive cleaning of exam room while observing appropriate contact time as indicated for disinfecting solutions.   HPI  Tracie Morris is a 65 y.o.-year-old female, presenting for f/u for adrenal incidentaloma and also postsurgical hypothyroidism. Last visit 09/2019.  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  She was feeling better but still had problems sleeping.  These problems continue and today she tells me that she actually has had them for many years. After last visit, we had to gradually decrease her levothyroxine dose as the TSH remained suppressed. She had diarrhea last year >> investigation was negative, including colonoscopy. She was taken of Plavix for this >> developed clots in her legs  >> had stents placed.  Reviewed and addended history: Pt's adrenal mass was incidentally found during investigation for kidney stones in 01/2015.  Abdominal CT from 06/2015: Left adrenal nodule of 9 mm measuring -11 HU.  PET scan on 08/02/2015 and this nodule was not hypermetabolic.  Chest CT (05/21/2016): I have discussed with the radiologist that read the original CT scan (Dr. Ardeen Garland) >> her adrenal adenoma appears stable compared with a CT scan obtained in 2016.  Chest CT (02/13/2018): There is a stable adenoma in the left adrenal measuring 1.3 x 1.2 cm.  No h/o hypokalemia, HTN, hyperglycemia, spells of HA + HTN. She does have a h/o migraines, relieved by prn Plavix (!).   Reviewed previous investigations: urine metanephrines, normetanephrines and catecholamines were normal.  However, at last check, normetanephrine and total metanephrines were slightly elevated.  This  is a nonspecific finding so we did not repeat the labs at that time.    Plasma renin activity and aldosterone levels have been normal  Dexamethasone suppression tests were normal.  Reviewed previous pertinent labs:  She had mild elevation in metanephrines and normetanephrines, which is nonspecific:  Component     Latest Ref Rng & Units 02/08/2020  Epinephrine     pg/mL 42  Norepinephrine     pg/mL 744  Dopamine     pg/mL 17  Total Catecholamines     pg/mL 803  ALDOSTERONE     ** ng/dL 5  Renin Activity     0.25 - 5.82 ng/mL/h 0.21 (L)  ALDO / PRA Ratio     0.9 - 28.9 Ratio 23.8  Metanephrine, Pl     <=57 pg/mL 65 (H)  Normetanephrine, Pl     <=148 pg/mL 150 (H)  Total Metanephrines-Plasma     <=205 pg/mL 215 (H)  Potassium     3.5 - 5.3 mmol/L 3.5   Component     Latest Ref Rng & Units 02/24/2018  Epinephrine     pg/mL 31  Norepinephrine     pg/mL 697  Dopamine     pg/mL 30  Catecholamines, Total     pg/mL 728  Metanephrine, Pl     <=57 pg/mL <25  Normetanephrine, Pl     <=148 pg/mL 197 (H)  Total Metanephrines-Plasma     <=205 pg/mL 197  ALDOSTERONE      ng/dL 5  Renin Activity     0.25 - 5.82 ng/mL/h 0.44  ALDO / PRA Ratio     0.9 -  28.9 Ratio 11.4  Potassium     3.5 - 5.1 mEq/L 3.5   Component     Latest Ref Rng & Units 06/09/2018  Cortisol - AM     mcg/dL 2.6 (L)   Component     Latest Ref Rng & Units 02/18/2017          Epinephrine      <LLN  Norepinephrine     pg/mL 558  Dopamine      <LLN  Catecholamines, Total     pg/mL 558  PRA LC/MS/MS     0.25 - 5.82 ng/mL/h 0.33  ALDO / PRA Ratio     0.9 - 28.9 Ratio 9.1  ALDOSTERONE     ng/dL 3  Metanephrine, Pl     <=57 pg/mL 46  Normetanephrine, Pl     <=148 pg/mL 170 (H)  Total Metanephrines-Plasma     <=205 pg/mL 216 (H)   Component     Latest Ref Rng & Units 06/04/2017  Cortisol - AM     mcg/dL 2.9 (L)   Component     Latest Ref Rng 08/23/2015 08/31/2015 01/16/2016 02/06/2016   Total Volume - CF 24Hr U         1400  Epinephrine, 24 hr Urine         REPORT  Norepinephrine, 24 hr Ur     15 - 100 mcg/24 h    19  Calculated Total (E+NE)     26 - 121 mcg/24 h    19 (L)  Dopamine, 24 hr Urine     52 - 480 mcg/24 h    116  Creatinine, Urine mg/day-CATEUR     0.63 - 2.50 g/24 h    0.65  Epinephrine      REPORT     Norepinephrine      534     Dopamine      REPORT     Catecholamines, Total      534     PRA LC/MS/MS     0.25 - 5.82 ng/mL/h 0.47  1.63   ALDO / PRA Ratio     0.9 - 28.9 Ratio 6.4  6.7   ALDOSTERONE      3  11   Metanephrine, Pl     <=57 pg/mL 38     Normetanephrine, Pl     <=148 pg/mL 151 (H)     Total Metanephrines-Plasma     <=205 pg/mL 189     Metanephrines, Ur     90 - 315 mcg/24 h    51 (L)  Normetanephr.,U,24h     122 - 676 mcg/24 h    128  Metanephrine, Ur     224 - 832 mcg/24 h    179 (L)  Potassium     3.5 - 5.1 mEq/L 4.0     Cortisol, Plasma       1.2 9.0    Postsurgical hypothyroidism: Patient had total thyroidectomy in 2007 for presumed thyroid cancer - Dr Carlis Abbott ([facility]); 1 parathyroid removed. Final pathology after thyroidectomy >> benign.   Pt is on levothyroxine 88 mcg daily (gradually decreased from 125 mcg, last dose change 12/22/2020), taken: - in am (4-5 am) - fasting - at least 30 min from b'fast - no Ca, Fe, PPIs - we moved MVIs later in the day - not on Biotin  She was missing doses of levothyroxine in the past, but none currently.  Her TFTs are fluctuating: Lab Results  Component  Value Date   TSH 0.11 (L) 12/22/2020   TSH 0.03 (L) 05/20/2020   TSH 0.07 (L) 02/08/2020   TSH 10.06 (H) 02/23/2019   TSH 2.97 12/22/2018   TSH 21.36 (H) 10/16/2018   FREET4 1.30 05/20/2020   FREET4 1.44 02/08/2020   FREET4 0.77 02/23/2019   FREET4 1.19 12/22/2018   FREET4 1.0 10/16/2018   FREET4 1.14 02/03/2018   Pt denies: - feeling nodules in neck - hoarseness - dysphagia - choking - SOB with lying down  She  has hereditary hemochromatosis. She has increased LFTs.  She is on propranolol for hand tremors >> helped significantly.  She has a lot of stress as husband has ESRD.   ROS: Constitutional: no weight gain/ weight loss, no fatigue, no subjective hyperthermia, no subjective hypothermia, + insomnia after stopping Xanax Eyes: no blurry vision, no xerophthalmia ENT: no sore throat, + see HPI Cardiovascular: no CP/no SOB/no palpitations/no leg swelling Respiratory: no cough/no SOB/no wheezing Gastrointestinal: no N/no V/no D/no C/no acid reflux Musculoskeletal: no muscle aches/no joint aches Skin: no rashes, no hair loss Neurological: resolved hand tremors - on Propranolol/no numbness/no tingling/no dizziness  I reviewed pt's medications, allergies, PMH, social hx, family hx, and changes were documented in the history of present illness. Otherwise, unchanged from my initial visit note.  Past Medical History:  Diagnosis Date  . Arthritis   . Basal cell carcinoma 03/29/2014   L lower abdomen  . Basal cell carcinoma 01/18/2020   central nasal tip, [facility] at [facility]  . Breast mass 09/05/2020   right breast mass 10 oclock  . Carotid artery occlusion   . Hemochromatosis   . Hyperlipidemia   . Hypocalcemia   . hypothyroidism 2007   acquired, secondary to surgery for thyroid Ca  . Hypothyroidism   . Migraine    improved since thyroid surgery, 2 years  . multinodular goiter 2007   s/p thyroidectomy  . 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 Repeat angiography in November of 2014 showed 50% distal aortic stenosis extending into the left common iliac artery which had 60-70% ostial stenosis. No other significant disease. I placed on balloon expandable stent into  left common iliac artery extending slightly into the distal aorta  . PONV (postoperative nausea and vomiting)   . Squamous cell  carcinoma of skin 06/23/2018   R nasal tip/in situ, txt'd  with [facility]    Past Surgical History:  Procedure Laterality Date  . ABDOMINAL AORTAGRAM N/A 10/14/2013   Procedure: ABDOMINAL [name];  Surgeon: Wellington Hampshire, MD;  Location: [place] CATH LAB;  Service: Cardiovascular;  Laterality: N/A;  . ABDOMINAL AORTOGRAM W/LOWER EXTREMITY N/A 10/05/2020   Procedure: ABDOMINAL AORTOGRAM W/LOWER EXTREMITY;  Surgeon: Wellington Hampshire, MD;  Location: [place] CV LAB;  Service: Cardiovascular;  Laterality: N/A;  . ANGIOPLASTY ILLIAC ARTERY    . CESAREAN SECTION    . COLONOSCOPY WITH PROPOFOL N/A 09/22/2020   Procedure: COLONOSCOPY WITH PROPOFOL;  Surgeon: Lin Landsman, MD;  Location: [place];  Service: Endoscopy;  Laterality: N/A;  . NOSE SURGERY    . PERIPHERAL VASCULAR CATHETERIZATION N/A 11/14/2016   Procedure: Abdominal Aortogram;  Surgeon: Wellington Hampshire, MD;  Location: [place] CV LAB;  Service: Cardiovascular;  Laterality: N/A;  . PERIPHERAL VASCULAR CATHETERIZATION Bilateral 11/14/2016   Procedure: Lower Extremity Angiography;  Surgeon: Wellington Hampshire, MD;  Location: [place] CV [facility];  Service: Cardiovascular;  Laterality: Bilateral;  limited iliacs only  . PERIPHERAL VASCULAR CATHETERIZATION Left 11/14/2016   Procedure: Peripheral Vascular Intervention;  Surgeon: Wellington Hampshire, MD;  Location: [place] [facility];  Service: Cardiovascular;  Laterality: Left;  common iliac  . PERIPHERAL VASCULAR INTERVENTION  10/05/2020   Procedure: PERIPHERAL VASCULAR INTERVENTION;  Surgeon: Wellington Hampshire, MD;  Location: [place] [facility];  Service: Cardiovascular;;  left common iliac   . PERIPHERAL VASCULAR THROMBECTOMY  10/05/2020   Procedure: PERIPHERAL VASCULAR THROMBECTOMY;  Surgeon: Wellington Hampshire, MD;  Location: [place] [facility];  Service: Cardiovascular;;  Mechanical - Left common iliac, left internal iliac, and left SFA  . POLYPECTOMY  09/22/2020   Procedure:  POLYPECTOMY;  Surgeon: Lin Landsman, MD;  Location: [place];  Service: Endoscopy;;  . SKIN CANCER EXCISION    . THYROID SURGERY  2007   secondary to thyroid cancer   Social History   Social History  . Marital Status: Married    Spouse Name: N/A  . Number of Children: 1   Occupational History  . Manager    Social History Main Topics  . Smoking status: Former Smoker -- 1.00 packs/day for 20 years    Types: Cigarettes    Quit date: 10/28/2011  . Smokeless tobacco: Never Used  . Alcohol Use: No  . Drug Use: No   Current Outpatient Medications on File Prior to Visit  Medication Sig Dispense Refill  . aspirin EC 81 MG tablet Take 1 tablet (81 mg total) by mouth daily. Swallow whole.    . clopidogrel (PLAVIX) 75 MG tablet TAKE 1 TABLET BY MOUTH ONCE DAILY 90 tablet 2  . Colostrum 500 MG CAPS Take 500 mg by mouth daily.     . Glycerin-Hypromellose-PEG 400 (DRY EYE RELIEF DROPS) 0.2-0.2-1 % SOLN Place 1 drop into both eyes daily as needed (Dry eye).    Marland Kitchen levothyroxine (SYNTHROID) 88 MCG tablet TAKE 1 TABLET BY MOUTH ONCE DAILY ON AN EMPTY STOMACH. WAIT 30 MINUTES BEFORE TAKING OTHER MEDS. 45 tablet 1  . Multiple Vitamin (MULTIVITAMIN WITH MINERALS) TABS tablet Take 1 tablet by mouth daily.    . propranolol (INDERAL) 10 MG tablet TAKE 1 TABLET BY MOUTH 3 TIMES DAILY AS NEEDED FOR TREMOR 90 tablet 3  . rosuvastatin (CRESTOR) 40 MG tablet Take 1 tablet (40 mg total) by mouth daily. 90 tablet 2   No current facility-administered medications on file prior to visit.   Allergies  Allergen Reactions  . Atorvastatin Other (See Comments)    Myalgias   . Darvocet [Propoxyphene N-Acetaminophen] Nausea And Vomiting  . Demerol Nausea And Vomiting  . Methocarbamol Other (See Comments)    UNKNOWN  . Mirtazapine     Sleeplessness, nausea   . Primidone     Severe insomnia, myoclonus and parathesias   . Ambien [Zolpidem Tartrate] Palpitations    Anxiety , trembling.    Family History  Problem Relation Age of Onset  . Alzheimer's disease Father   . Stroke Mother   . Coronary artery disease Maternal Grandmother   . Heart disease Maternal Grandmother 70  . Coronary artery disease Paternal Grandmother 76  . Heart disease Paternal Grandmother 74  . Breast cancer Neg Hx    PE: BP 110/68 (BP Location: Right Arm, Patient Position: Sitting, Cuff Size: Normal)   Pulse 67   Ht 5' 7.5" (1.715 m)   Wt 114 lb 9.6 oz (52 kg)   SpO2 96%  BMI 17.68 kg/m  Body mass index is 17.68 kg/m. Ht 5.8" Wt Readings from Last 3 Encounters:  03/08/21 114 lb 9.6 oz (52 kg)  02/20/21 114 lb (51.7 kg)  01/31/21 113 lb 8 oz (51.5 kg)   Constitutional: normal weight, in NAD Eyes: PERRLA, EOMI, no exophthalmos ENT: moist mucous membranes, no thyromegaly, no cervical lymphadenopathy Cardiovascular: RRR, No MRG Respiratory: CTA B Gastrointestinal: abdomen soft, NT, ND, BS+ Musculoskeletal: no deformities, strength intact in all 4 Skin: moist, warm, no rashes Neurological: no tremor with outstretched hands, DTR normal in all 4  ASSESSMENT: 1. Adrenal incidentaloma  2. Postsurgical hypothyroidism  3. Presumed ThyCA I received the report of her thyroid pathology from [facility], from 02/06/2006: - Total thyroidectomy: A. Right thyroid: Nodular hyperplasia B. Left thyroid: Nodular hyperplasia with 1.9 cm dominant adenomatoid nodule. Parathyroid tissue in a single section, measuring 0.7 mm. Conclusion: There is nodular hyperplasia (multinodular goiter). There is no evidence of malignancy.   PLAN:  1. Patient a 1 cm left adrenal nodule discovered incidentally during investigation for kidney stones. -Previous hormonal work-up and imaging indicated a benign, nonsecreting nodule.  She had slightly elevated metanephrines and normetanephrine's in the past, which is nonspecific.  Her dexamethasone suppression test was normal.  Aldosterone and plasma renin  activity was also normal. -Her hormones were stable over 6 years, so no more investigation is needed for this.  2. Hypothyroidism -Patient with history of uncontrolled hypothyroidism, with fluctuating TFTs - latest thyroid labs reviewed with pt >> TSH was suppressed Lab Results  Component Value Date   TSH 0.11 (L) 12/22/2020   - she continues on LT4 88 mcg daily - pt feels good on this dose except for insomnia, which is chronic. - we discussed at this visit, that we most likely had to decrease the levothyroxine doses due to the fact that she was previously taking the medication incorrectly/inconsistently.  The doses of levothyroxine were previously increased because of this, but now that she is taking it correctly, we were able to decrease them.  A total daily dose of 75-88 mcg daily is expected for her body habitus. - we discussed about taking the thyroid hormone every day, with water, >30 minutes before breakfast, separated by >4 hours from acid reflux medications, calcium, iron, multivitamins. Pt. is taking it correctly.  She did not miss doses.  - will check thyroid tests today: TSH and fT4 - If labs are abnormal, she will need to return for repeat her TFTs in 1.5 months -I will see her back in 6 months  Component     Latest Ref Rng & Units 03/08/2021  TSH     0.35 - 4.50 uIU/mL 0.03 (L)  T4,Free(Direct)     0.60 - 1.60 ng/dL 1.48  TSH is suppressed, so we will need to decrease the levothyroxine dose to 75 mcg daily and check her TFTs in 1.5 months.  Philemon Kingdom, MD PhD [facility] Endocrinology

## 2021-03-08 NOTE — Patient Instructions (Addendum)
Please continue Levothyroxine 88 mcg daily.  Take the thyroid hormone every day, with water, at least 30 minutes before breakfast, separated by at least 4 hours from: - acid reflux medications - calcium - iron - multivitamins  Please stop at the lab.  Please come back for a follow-up appointment in 6 months.

## 2021-03-09 ENCOUNTER — Encounter: Payer: Self-pay | Admitting: Internal Medicine

## 2021-03-09 MED ORDER — LEVOTHYROXINE SODIUM 75 MCG PO TABS: 75.0000 ug | ORAL_TABLET | Freq: Every day | ORAL | 3 refills | Status: DC

## 2021-04-03 DIAGNOSIS — Z85828 Personal history of other malignant neoplasm of skin: Secondary | ICD-10-CM | POA: Diagnosis not present

## 2021-05-02 ENCOUNTER — Telehealth: Payer: Self-pay | Admitting: Internal Medicine

## 2021-05-02 ENCOUNTER — Other Ambulatory Visit: Payer: Self-pay

## 2021-05-02 NOTE — Telephone Encounter (Signed)
Message left for patient to return my call.  

## 2021-05-02 NOTE — Telephone Encounter (Signed)
Pt would like lab orders sent to [name], MD    [address], [place], [place] [address] Phone: [phone]

## 2021-05-03 NOTE — Telephone Encounter (Signed)
My chart message sent to pt.

## 2021-05-26 ENCOUNTER — Other Ambulatory Visit (INDEPENDENT_AMBULATORY_CARE_PROVIDER_SITE_OTHER): Payer: Medicare PPO

## 2021-05-26 ENCOUNTER — Other Ambulatory Visit: Payer: Self-pay

## 2021-05-26 DIAGNOSIS — E032 Hypothyroidism due to medicaments and other exogenous substances: Secondary | ICD-10-CM | POA: Diagnosis not present

## 2021-05-26 LAB — TSH: TSH: 0.31 u[IU]/mL — ABNORMAL LOW (ref 0.35–5.50)

## 2021-05-26 LAB — T4, FREE: Free T4: 1.18 ng/dL (ref 0.60–1.60)

## 2021-06-05 ENCOUNTER — Ambulatory Visit: Payer: Medicare PPO | Admitting: Dermatology

## 2021-06-05 ENCOUNTER — Other Ambulatory Visit: Payer: Self-pay

## 2021-06-05 DIAGNOSIS — Z85828 Personal history of other malignant neoplasm of skin: Secondary | ICD-10-CM | POA: Diagnosis not present

## 2021-06-05 DIAGNOSIS — L814 Other melanin hyperpigmentation: Secondary | ICD-10-CM | POA: Diagnosis not present

## 2021-06-05 DIAGNOSIS — L820 Inflamed seborrheic keratosis: Secondary | ICD-10-CM | POA: Diagnosis not present

## 2021-06-05 DIAGNOSIS — Z86007 Personal history of in-situ neoplasm of skin: Secondary | ICD-10-CM

## 2021-06-05 DIAGNOSIS — L821 Other seborrheic keratosis: Secondary | ICD-10-CM | POA: Diagnosis not present

## 2021-06-05 DIAGNOSIS — L578 Other skin changes due to chronic exposure to nonionizing radiation: Secondary | ICD-10-CM | POA: Diagnosis not present

## 2021-06-05 DIAGNOSIS — D229 Melanocytic nevi, unspecified: Secondary | ICD-10-CM

## 2021-06-05 DIAGNOSIS — D2222 Melanocytic nevi of left ear and external auricular canal: Secondary | ICD-10-CM

## 2021-06-05 DIAGNOSIS — D239 Other benign neoplasm of skin, unspecified: Secondary | ICD-10-CM

## 2021-06-05 DIAGNOSIS — Z1283 Encounter for screening for malignant neoplasm of skin: Secondary | ICD-10-CM | POA: Diagnosis not present

## 2021-06-05 DIAGNOSIS — D2262 Melanocytic nevi of left upper limb, including shoulder: Secondary | ICD-10-CM

## 2021-06-05 DIAGNOSIS — L853 Xerosis cutis: Secondary | ICD-10-CM

## 2021-06-05 DIAGNOSIS — D1800 Hemangioma unspecified site: Secondary | ICD-10-CM

## 2021-06-05 DIAGNOSIS — D2339 Other benign neoplasm of skin of other parts of face: Secondary | ICD-10-CM | POA: Diagnosis not present

## 2021-06-05 DIAGNOSIS — D692 Other nonthrombocytopenic purpura: Secondary | ICD-10-CM

## 2021-06-05 NOTE — Patient Instructions (Addendum)
If you have any questions or concerns for your doctor, please call our main line at [phone] and press option 4 to reach your doctor's medical assistant. If no one answers, please leave a voicemail as directed and we will return your call as soon as possible. Messages left after 4 pm will be answered the following business day.   You may also send us a message via MyChart. We typically respond to MyChart messages within 1-2 business days.  For prescription refills, please ask your pharmacy to contact our office. Our fax number is [phone].  If you have an urgent issue when the clinic is closed that cannot wait until the next business day, you can page your doctor at the number below.    Please note that while we do our best to be available for urgent issues outside of office hours, we are not available 24/7.   If you have an urgent issue and are unable to reach us, you may choose to seek medical care at your doctor's office, retail clinic, urgent care center, or emergency room.  If you have a medical emergency, please immediately call 911 or go to the emergency department.  Pager Numbers  - Dr. Kowalski: [phone]  - Dr. Moye: [phone]  - Dr. Stewart: [phone]  In the event of inclement weather, please call our main line at [phone] for an update on the status of any delays or closures.  Dermatology Medication Tips: Please keep the boxes that topical medications come in in order to help keep track of the instructions about where and how to use these. Pharmacies typically print the medication instructions only on the boxes and not directly on the medication tubes.   If your medication is too expensive, please contact our office at [phone] option 4 or send us a message through MyChart.   We are unable to tell what your co-pay for medications will be in advance as this is different depending on your insurance coverage. However, we may be able to find a substitute  medication at lower cost or fill out paperwork to get insurance to cover a needed medication.   If a prior authorization is required to get your medication covered by your insurance company, please allow us 1-2 business days to complete this process.  Drug prices often vary depending on where the prescription is filled and some pharmacies may offer cheaper prices.  The website www.goodrx.com contains coupons for medications through different pharmacies. The prices here do not account for what the cost may be with help from insurance (it may be cheaper with your insurance), but the website can give you the price if you did not use any insurance.  - You can print the associated coupon and take it with your prescription to the pharmacy.  - You may also stop by our office during regular business hours and pick up a GoodRx coupon card.  - If you need your prescription sent electronically to a different pharmacy, notify our office through [place] MyChart or by phone at [phone] option 4.   Gentle Skin Care Guide  1. Bathe no more than once a day.  2. Avoid bathing in hot water  3. Use a mild soap like Dove, Vanicream, Cetaphil, CeraVe. Can use Lever 2000 or Cetaphil antibacterial soap  4. Use soap only where you need it. On most days, use it under your arms, between your legs, and on your feet. Let the water rinse other areas unless visibly dirty.  5.   When you get out of the bath/shower, use a towel to gently blot your skin dry, don't rub it.  6. While your skin is still a little damp, apply a moisturizing cream such as Vanicream, CeraVe, Cetaphil, Eucerin, Sarna lotion or plain Vaseline Jelly. For hands apply Neutrogena Norwegian Hand Cream or Excipial Hand Cream.  7. Reapply moisturizer any time you start to itch or feel dry.  8. Sometimes using free and clear laundry detergents can be helpful. Fabric softener sheets should be avoided. Downy Free & Gentle liquid, or any liquid fabric  softener that is free of dyes and perfumes, it acceptable to use  9. If your doctor has given you prescription creams you may apply moisturizers over them      

## 2021-06-05 NOTE — Progress Notes (Signed)
Follow-Up Visit   Subjective  Tracie Morris is a 65 y.o. female who presents for the following: Annual Exam (Hx BCC, SCCIS - patient has not noticed any new or changing moles, lesions, or spots that she's concerned about.). She has a couple irritated spots R axilla. She also had scar revision done on her nose post Mohs surgery.  The following portions of the chart were reviewed this encounter and updated as appropriate:       Review of Systems:  No other skin or systemic complaints except as noted in HPI or Assessment and Plan.  Objective  Well appearing patient in no apparent distress; mood and affect are within normal limits.  A full examination was performed including scalp, head, eyes, ears, nose, lips, neck, chest, axillae, abdomen, back, buttocks, bilateral upper extremities, bilateral lower extremities, hands, feet, fingers, toes, fingernails, and toenails. All findings within normal limits unless otherwise noted below.  L upper arm, L anti helix 0.2 cm med dark brown papule ( L upper arm) 0.2 cm blue grey macule (L anti helix)  R ant axilla x 2 (2) Erythematous keratotic or waxy stuck-on papule  R zygoma 2.5 mm blue papule  R cheek Stuck-on, waxy, tan-brown macules -- Discussed benign etiology and prognosis.   Assessment & Plan  Nevus L upper arm, L anti helix  Benign-appearing.  Stable. Observation.  Call clinic for new or changing lesions.  Recommend daily use of broad spectrum spf 30+ sunscreen to sun-exposed areas.    Inflamed seborrheic keratosis R ant axilla x 2  Irritated   Destruction of lesion - R ant axilla x 2  Destruction method: cryotherapy   Informed consent: discussed and consent obtained   Lesion destroyed using liquid nitrogen: Yes   Region frozen until ice ball extended beyond lesion: Yes   Outcome: patient tolerated procedure well with no complications   Post-procedure details: wound care instructions given   Additional details:   Prior to procedure, discussed risks of blister formation, small wound, skin dyspigmentation, or rare scar following cryotherapy. Recommend Vaseline ointment to treated areas while healing.   Blue nevus R zygoma  Benign-appearing.  Observation.  Call clinic for new or changing lesions.  Recommend daily use of broad spectrum spf 30+ sunscreen to sun-exposed areas.    Seborrheic keratosis R cheek  Benign, observe.  Pt interested in removal at later date  Discussed cryotherapy cosmetic procedure, noncovered.  $60 for 1st lesion and $15 for each additional lesion if done on the same day.  Maximum charge $350.  One touch-up treatment included no charge. Discussed risks of treatment including dyspigmentation, small scar, and/or recurrence. Recommend daily broad spectrum sunscreen SPF 30+/photoprotection to treated areas once healed.   Patient to schedule when convenient to her as she has an upcoming event.    Lentigines - Scattered tan macules - Due to sun exposure - Benign-appering, observe - Recommend daily broad spectrum sunscreen SPF 30+ to sun-exposed areas, reapply every 2 hours as needed. - Call for any changes  Seborrheic Keratoses - Stuck-on, waxy, tan-brown papules and/or plaques  - Benign-appearing - Discussed benign etiology and prognosis. - Observe - Call for any changes  Melanocytic Nevi - Tan-brown and/or pink-flesh-colored symmetric macules and papules - Benign appearing on exam today - Observation - Call clinic for new or changing moles - Recommend daily use of broad spectrum spf 30+ sunscreen to sun-exposed areas.   Hemangiomas - Red papules - Discussed benign nature - Observe - Call for  any changes  Actinic Damage - Chronic condition, secondary to cumulative UV/sun exposure - diffuse scaly erythematous macules with underlying dyspigmentation - Recommend daily broad spectrum sunscreen SPF 30+ to sun-exposed areas, reapply every 2 hours as needed.  - Staying  in the shade or wearing long sleeves, sun glasses (UVA+UVB protection) and wide brim hats (4-inch brim around the entire circumference of the hat) are also recommended for sun protection.  - Call for new or changing lesions.  History of Basal Cell Carcinoma of the Skin - No evidence of recurrence today, central nasal tip - Recommend regular full body skin exams - Recommend daily broad spectrum sunscreen SPF 30+ to sun-exposed areas, reapply every 2 hours as needed.  - Call if any new or changing lesions are noted between office visits  History of Squamous Cell Carcinoma in Situ of the Skin - No evidence of recurrence today, R nasal tip - Recommend regular full body skin exams - Recommend daily broad spectrum sunscreen SPF 30+ to sun-exposed areas, reapply every 2 hours as needed.  - Call if any new or changing lesions are noted between office visits  Purpura - Chronic; persistent and recurrent.  Treatable, but not curable. - Violaceous macules and patches - Benign - Related to trauma, age, sun damage and/or use of blood thinners, chronic use of topical and/or oral steroids - Observe - Can use OTC arnica containing moisturizer such as Dermend Bruise Formula if desired - Call for worsening or other concerns  Xerosis - diffuse xerotic patches - recommend gentle, hydrating skin care - gentle skin care handout given  Skin cancer screening performed today.  Return in about 1 year (around 06/05/2022) for TBSE.  Luther Redo, CMA, am acting as scribe for [name], MD .  Documentation: I have reviewed the above documentation for accuracy and completeness, and I agree with the above.  [name] MD

## 2021-06-08 ENCOUNTER — Telehealth: Payer: Self-pay

## 2021-06-08 ENCOUNTER — Other Ambulatory Visit: Payer: Self-pay | Admitting: Internal Medicine

## 2021-06-08 DIAGNOSIS — Z1231 Encounter for screening mammogram for malignant neoplasm of breast: Secondary | ICD-10-CM

## 2021-06-08 DIAGNOSIS — Z13820 Encounter for screening for osteoporosis: Secondary | ICD-10-CM

## 2021-06-08 NOTE — Telephone Encounter (Signed)
Order has been placed in quick sign folder.  

## 2021-06-08 NOTE — Telephone Encounter (Signed)
Pt would like to have a bone density test at [facility] had a baseline done there in 2014. Please send in referral/order

## 2021-06-09 NOTE — Telephone Encounter (Signed)
Pt is aware that order has been placed and she can call over the [place] to schedule. Order has been signed and faxed.

## 2021-06-14 DIAGNOSIS — M8589 Other specified disorders of bone density and structure, multiple sites: Secondary | ICD-10-CM | POA: Diagnosis not present

## 2021-06-14 LAB — HM DEXA SCAN

## 2021-07-17 ENCOUNTER — Encounter: Payer: Self-pay | Admitting: Internal Medicine

## 2021-08-01 ENCOUNTER — Other Ambulatory Visit: Payer: Self-pay | Admitting: Internal Medicine

## 2021-08-03 ENCOUNTER — Other Ambulatory Visit: Payer: Self-pay

## 2021-08-03 ENCOUNTER — Ambulatory Visit: Payer: Medicare PPO | Admitting: Cardiovascular Disease

## 2021-08-03 ENCOUNTER — Encounter: Payer: Self-pay | Admitting: Cardiovascular Disease

## 2021-08-03 ENCOUNTER — Encounter: Payer: Self-pay | Admitting: Internal Medicine

## 2021-08-03 ENCOUNTER — Ambulatory Visit: Payer: Medicare PPO | Admitting: Internal Medicine

## 2021-08-03 VITALS — BP 142/74 | HR 74 | Ht 67.5 in | Wt 114.4 lb

## 2021-08-03 VITALS — BP 134/76 | HR 78 | Temp 98.1°F | Resp 12 | Ht 67.5 in | Wt 114.2 lb

## 2021-08-03 DIAGNOSIS — F339 Major depressive disorder, recurrent, unspecified: Secondary | ICD-10-CM

## 2021-08-03 DIAGNOSIS — I739 Peripheral vascular disease, unspecified: Secondary | ICD-10-CM | POA: Diagnosis not present

## 2021-08-03 DIAGNOSIS — E032 Hypothyroidism due to medicaments and other exogenous substances: Secondary | ICD-10-CM

## 2021-08-03 DIAGNOSIS — I2510 Atherosclerotic heart disease of native coronary artery without angina pectoris: Secondary | ICD-10-CM

## 2021-08-03 DIAGNOSIS — E785 Hyperlipidemia, unspecified: Secondary | ICD-10-CM | POA: Diagnosis not present

## 2021-08-03 DIAGNOSIS — M810 Age-related osteoporosis without current pathological fracture: Secondary | ICD-10-CM

## 2021-08-03 DIAGNOSIS — E559 Vitamin D deficiency, unspecified: Secondary | ICD-10-CM

## 2021-08-03 DIAGNOSIS — Z720 Tobacco use: Secondary | ICD-10-CM | POA: Diagnosis not present

## 2021-08-03 DIAGNOSIS — E7800 Pure hypercholesterolemia, unspecified: Secondary | ICD-10-CM | POA: Diagnosis not present

## 2021-08-03 DIAGNOSIS — I2584 Coronary atherosclerosis due to calcified coronary lesion: Secondary | ICD-10-CM

## 2021-08-03 DIAGNOSIS — I779 Disorder of arteries and arterioles, unspecified: Secondary | ICD-10-CM | POA: Diagnosis not present

## 2021-08-03 LAB — COMPREHENSIVE METABOLIC PANEL
ALT: 19 U/L (ref 0–35)
AST: 26 U/L (ref 0–37)
Albumin: 4.1 g/dL (ref 3.5–5.2)
Alkaline Phosphatase: 87 U/L (ref 39–117)
BUN: 12 mg/dL (ref 6–23)
CO2: 28 mEq/L (ref 19–32)
Calcium: 9.3 mg/dL (ref 8.4–10.5)
Chloride: 103 mEq/L (ref 96–112)
Creatinine, Ser: 0.82 mg/dL (ref 0.40–1.20)
GFR: 74.71 mL/min (ref >60.00)
Glucose, Bld: 96 mg/dL (ref 70–99)
Potassium: 3.9 mEq/L (ref 3.5–5.1)
Sodium: 139 mEq/L (ref 135–145)
Total Bilirubin: 0.6 mg/dL (ref 0.2–1.2)
Total Protein: 7.2 g/dL (ref 6.0–8.3)

## 2021-08-03 LAB — VITAMIN D 25 HYDROXY (VIT D DEFICIENCY, FRACTURES): VITD: 32.21 ng/mL (ref 30.00–100.00)

## 2021-08-03 LAB — TSH: TSH: 0.46 u[IU]/mL (ref 0.35–5.50)

## 2021-08-03 MED ORDER — MIRTAZAPINE 15 MG PO TABS: 15.0000 mg | ORAL_TABLET | Freq: Every day | ORAL | 2 refills | Status: DC

## 2021-08-03 NOTE — Progress Notes (Signed)
Cardiology Office Note   Date:  08/03/2021   ID:  Tracie Morris, DOB 22-Apr-1956, MRN [id number]  PCP:  [name], MD  Cardiologist:   [name], MD   Chief Complaint  Patient presents with   Other    6 month f/u no complaints today. Meds reviewed verbally with pt.      History of Present Illness: Tracie Morris is a 65 y.o. female who presents for a followup visit regarding peripheral arterial disease.  She is status post left common iliac artery stent placement in November of 2014 for significant claudication with subsequent covered stent placement in 2017 for in situ thrombosis with subtotal occlusion and recurrent stent thrombosis in the setting of interruption of clopidogrel.  Most recently in November 2021 which required thrombectomy and stent placement.  She has known history of hereditary hemochromatosis which likely contributes to hypercoagulable state. Other Medical history include  tobacco use and hyperlipidemia   Previous CT scan of the chest did show calcifications in the coronary arteries. 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  She has been doing well with no recent chest pain, shortness of breath or palpitations.  No leg claudication.  She quit smoking for about a month but then relapsed.   Past Medical History:  Diagnosis Date   Arthritis    Basal cell carcinoma 03/29/2014   L lower abdomen   Basal cell carcinoma 01/18/2020   central nasal tip, mohs at [facility]   Breast mass 09/05/2020   right breast mass 10 oclock   Carotid artery occlusion    Hemochromatosis    Hyperlipidemia    Hypocalcemia    hypothyroidism 2007   acquired, secondary to surgery for thyroid Ca   Hypothyroidism    Migraine    improved since thyroid surgery, 2 years   multinodular goiter 2007   s/p thyroidectomy   Peripheral arterial disease ([place])    Left common iliac artery occlusion status post balloon angioplasty without stent placement  in January of 2014. Repeat angiography in November of 2014 showed 50% distal aortic stenosis extending into the left common iliac artery which had 60-70% ostial stenosis. No other significant disease. 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   R nasal tip/in situ, txt'd  with [facility]     Past Surgical History:  Procedure Laterality Date   ABDOMINAL AORTAGRAM N/A 10/14/2013   Procedure: ABDOMINAL [name];  Surgeon: Wellington Hampshire, MD;  Location: [facility];  Service: Cardiovascular;  Laterality: N/A;   ABDOMINAL AORTOGRAM W/LOWER EXTREMITY N/A 10/05/2020   Procedure: ABDOMINAL AORTOGRAM W/LOWER EXTREMITY;  Surgeon: Wellington Hampshire, MD;  Location: [facility];  Service: Cardiovascular;  Laterality: N/A;   ANGIOPLASTY ILLIAC ARTERY     CESAREAN SECTION     COLONOSCOPY WITH PROPOFOL N/A 09/22/2020   Procedure: COLONOSCOPY WITH PROPOFOL;  Surgeon: Lin Landsman, MD;  Location: [facility];  Service: Endoscopy;  Laterality: N/A;   NOSE SURGERY     PERIPHERAL VASCULAR CATHETERIZATION N/A 11/14/2016   Procedure: Abdominal Aortogram;  Surgeon: Wellington Hampshire, MD;  Location: [facility];  Service: Cardiovascular;  Laterality: N/A;   PERIPHERAL VASCULAR CATHETERIZATION Bilateral 11/14/2016   Procedure: Lower Extremity Angiography;  Surgeon: Wellington Hampshire, MD;  Location: [place] [facility];  Service: Cardiovascular;  Laterality: Bilateral;  limited iliacs only   PERIPHERAL VASCULAR CATHETERIZATION Left 11/14/2016   Procedure: Peripheral Vascular Intervention;  Surgeon: Wellington Hampshire, MD;  Location: [facility] [facility];  Service: Cardiovascular;  Laterality: Left;  common iliac   PERIPHERAL VASCULAR INTERVENTION  10/05/2020   Procedure: PERIPHERAL VASCULAR INTERVENTION;  Surgeon: Wellington Hampshire, MD;  Location:  [facility];  Service: Cardiovascular;;  left common iliac    PERIPHERAL VASCULAR THROMBECTOMY  10/05/2020   Procedure: PERIPHERAL VASCULAR THROMBECTOMY;  Surgeon: Wellington Hampshire, MD;  Location: [facility];  Service: Cardiovascular;;  Mechanical - Left common iliac, left internal iliac, and left SFA   POLYPECTOMY  09/22/2020   Procedure: POLYPECTOMY;  Surgeon: Lin Landsman, MD;  Location: [facility];  Service: Endoscopy;;   SKIN CANCER EXCISION     THYROID SURGERY  2007   secondary to thyroid cancer     Current Outpatient Medications  Medication Sig Dispense Refill   aspirin EC 81 MG tablet Take 1 tablet (81 mg total) by mouth daily. Swallow whole.     clopidogrel (PLAVIX) 75 MG tablet TAKE 1 TABLET BY MOUTH ONCE DAILY 90 tablet 2   Colostrum 500 MG CAPS Take 500 mg by mouth daily.      Glycerin-Hypromellose-PEG 400 (DRY EYE RELIEF DROPS) 0.2-0.2-1 % SOLN Place 1 drop into both eyes daily as needed (Dry eye).     levothyroxine (SYNTHROID) 75 MCG tablet TAKE 1 TABLET BY MOUTH ONCE DAILY ON AN EMPTY STOMACH. WAIT 30 MINUTES BEFORE TAKING OTHER MEDS. 90 tablet 0   Multiple Vitamin (MULTIVITAMIN WITH MINERALS) TABS tablet Take 1 tablet by mouth daily.     propranolol (INDERAL) 10 MG tablet TAKE 1 TABLET BY MOUTH 3 TIMES DAILY AS NEEDED FOR TREMOR 90 tablet 3   rosuvastatin (CRESTOR) 40 MG tablet Take 1 tablet (40 mg total) by mouth daily. 90 tablet 2   No current facility-administered medications for this visit.    Allergies:   Atorvastatin, Darvocet [propoxyphene n-acetaminophen], Demerol, Methocarbamol, Mirtazapine, Primidone, and Ambien [zolpidem tartrate]    Social History:  The patient  reports that she has been smoking cigarettes. She has a 20.00 pack-year smoking history. She has never used smokeless tobacco. She reports that she does not currently use alcohol. She reports that she does not use drugs.   Family History:  The patient's family history  includes Alzheimer's disease in her father; Coronary artery disease in her maternal grandmother; Coronary artery disease (age of onset: 56) in her paternal grandmother; Heart disease (age of onset: 35) in her maternal grandmother; Heart disease (age of onset: 11) in her paternal grandmother; Stroke in her mother.    ROS:  Please see the history of present illness.   Otherwise, review of systems are positive for none.   All other systems are reviewed and negative.    PHYSICAL EXAM: VS:  BP (!) 142/74 (BP Location: Left Arm, Patient Position: Sitting, Cuff Size: Normal)   Pulse 74   Ht 5' 7.5" (1.715 m)   Wt 114 lb 6 oz (51.9 kg)   SpO2 98%   BMI 17.65 kg/m  , BMI Body mass index is 17.65 kg/m. GEN: Well nourished, well developed, in no acute distress  HEENT: normal  Neck: no JVD, carotid bruits, or masses Cardiac: RRR; no murmurs, rubs, or gallops,no edema  Respiratory:  clear to auscultation bilaterally, normal work of breathing GI: soft, nontender, nondistended, + BS MS: no  deformity or atrophy  Skin: warm and dry, no rash Neuro:  Strength and sensation are intact Psych: euthymic mood, full affect Vascular: Posterior tibial and dorsalis pedis pulses palpable on the right.  On the left, the posterior tibial is palpable.   EKG:  EKG is ordered today. EKG showed normal sinus rhythm with no significant ST or T wave changes.   Recent Labs: 12/22/2020: ALT 13; BUN 13; Creatinine, Ser 0.79; Potassium 4.4; Sodium 142 02/16/2021: Hemoglobin 16.0; Platelets 237 05/26/2021: TSH 0.31    Lipid Panel    Component Value Date/Time   CHOL 119 12/22/2020 0803   CHOL 196 02/29/2020 1206   TRIG 69.0 12/22/2020 0803   HDL 49.20 12/22/2020 0803   HDL 46 02/29/2020 1206   CHOLHDL 2 12/22/2020 0803   VLDL 13.8 12/22/2020 0803   LDLCALC 56 12/22/2020 0803   LDLCALC 133 (H) 02/29/2020 1206      Wt Readings from Last 3 Encounters:  08/03/21 114 lb 6 oz (51.9 kg)  03/08/21 114 lb 9.6 oz (52  kg)  02/20/21 114 lb (51.7 kg)        ASSESSMENT AND PLAN:  1.  Peripheral arterial disease: With recurrent stent thrombosis of the left common iliac artery likely due to hypercoagulable state.   No recurrent claudication since most recent intervention..  I recommend dual antiplatelet therapy indefinitely. I requested a follow-up ABI and aortoiliac duplex to be done in March.  2. Hyperlipidemia: Lipid profile improved significantly since rosuvastatin was increased to 40 mg daily with most recent LDL of 56.  3. Carotid disease: This was mild on recent carotid Dopple in May of 2021.  4. Coronary calcifications: No anginal symptoms. Continue aggressive treatment of risk factors.  5. Tobacco use: Unfortunately she resumed smoking.  I discussed with her the importance of smoking cessation.    Disposition: Follow-up with me in 6 months.  Signed,  [name], MD  08/03/2021 10:40 AM    [place]

## 2021-08-03 NOTE — Patient Instructions (Signed)
Medication Instructions:  Your physician recommends that you continue on your current medications as directed. Please refer to the Current Medication list given to you today.  *If you need a refill on your cardiac medications before your next appointment, please call your pharmacy*   Lab Work: None ordered If you have labs (blood work) drawn today and your tests are completely normal, you will receive your results only by: [place] [place] (if you have MyChart) OR A paper copy in the mail If you have any lab test that is abnormal or we need to change your treatment, we will call you to review the results.   Testing/Procedures: Your physician has requested that you have an ankle brachial index (ABI). During this test an ultrasound and blood pressure cuff are used to evaluate the arteries that supply the arms and legs with blood. Allow thirty minutes for this exam. There are no restrictions or special instructions.  Your physician has requested that you have an aorta/ivc/iliac duplex.   (To be scheduled in March 2023)   Follow-Up: At [facility] you and your health needs are our priority.  As part of our continuing mission to provide you with exceptional heart care, we have created designated Provider Care Teams.  These Care Teams include your primary Cardiologist (physician) and Advanced Practice Providers (APPs -  Physician Assistants and Nurse Practitioners) who all work together to provide you with the care you need, when you need it.  We recommend signing up for the patient portal called "MyChart".  Sign up information is provided on this After Visit Summary.  MyChart is used to connect with patients for Virtual Visits (Telemedicine).  Patients are able to view lab/test results, encounter notes, upcoming appointments, etc.  Non-urgent messages can be sent to your provider as well.   To learn more about what you can do with MyChart, go to [email or web address].    Your next  appointment:   After PV testing in March 2023  The format for your next appointment:   In Person  Provider:   [name], MD   Other Instructions N/A

## 2021-08-03 NOTE — Progress Notes (Signed)
Subjective:  Patient ID: Tracie Morris, female    DOB: 08/20/1956  Age: 65 y.o. MRN: [id number]  CC: The primary encounter diagnosis was Vitamin D deficiency. Diagnoses of Pure hypercholesterolemia, Iatrogenic hypothyroidism, Osteoporosis without current pathological fracture, unspecified osteoporosis type, and Episode of recurrent major depressive disorder, unspecified depression episode severity ([place]) were also pertinent to this visit.  HPI Tracie Morris presents to  Chief Complaint  Patient presents with   discuss DEXA   DEXA done July 2022 with t scores -2.4 in hip .  Risk factors:  ongoing low BMI of < 18,  history of alcoholism , ongoing tobacco abuse  iatrogenic hyperthyroidism (see below, hypothyrodism managed by Dr [name]) .   No history of fragility fractures.  Thinsk she is taking calcium supplements in the form of Dr Almyra Deforest Vitamin [phone]   .but no calcium is listed in ingredients   Wants to quit smoking , but Positive depression screen.  Can't maintain a healthy weight due to recurrent illness.  Has a healthy appetite. Eats a small breakfast  Started smoking again  in February.  She and husband had COVID in February, followed by a recurrent infection  in July and early  august (fatigue,  sinus congestion,  sore throat , low grade fever_  Finally feeling better physically. Still has altered sense of taste from [place]   Retired one year ago today due to [employer].  Misses  work. And the life she had before Orpah Greek became so chronically ill. 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  Wants to resume pharmacotherapy, previous use of lexapro   Lab Results  Component Value Date   TSH 0.46 08/03/2021      Outpatient Medications Prior to Visit  Medication Sig Dispense Refill   aspirin EC 81 MG tablet Take 1 tablet (81 mg total) by mouth daily. Swallow whole.     clopidogrel (PLAVIX) 75 MG  tablet TAKE 1 TABLET BY MOUTH ONCE DAILY 90 tablet 2   Colostrum 500 MG CAPS Take 500 mg by mouth daily.      Glycerin-Hypromellose-PEG 400 (DRY EYE RELIEF DROPS) 0.2-0.2-1 % SOLN Place 1 drop into both eyes daily as needed (Dry eye).     levothyroxine (SYNTHROID) 75 MCG tablet TAKE 1 TABLET BY MOUTH ONCE DAILY ON AN EMPTY STOMACH. WAIT 30 MINUTES BEFORE TAKING OTHER MEDS. 90 tablet 0   Multiple Vitamin (MULTIVITAMIN WITH MINERALS) TABS tablet Take 1 tablet by mouth daily.     propranolol (INDERAL) 10 MG tablet TAKE 1 TABLET BY MOUTH 3 TIMES DAILY AS NEEDED FOR TREMOR 90 tablet 3   rosuvastatin (CRESTOR) 40 MG tablet Take 1 tablet (40 mg total) by mouth daily. 90 tablet 2   No facility-administered medications prior to visit.    Review of Systems;  Patient denies headache, fevers, malaise, unintentional weight loss, skin rash, eye pain, sinus congestion and sinus pain, sore throat, dysphagia,  hemoptysis , cough, dyspnea, wheezing, chest pain, palpitations, orthopnea, edema, abdominal pain, nausea, melena, diarrhea, constipation, flank pain, dysuria, hematuria, urinary  Frequency, nocturia, numbness, tingling, seizures,  Focal weakness, Loss of consciousness,  Tremor, insomnia, depression, anxiety, and suicidal ideation.      Objective:  BP 134/76 (BP Location: Left Arm, Patient Position: Sitting, Cuff Size: Normal)   Pulse 78   Temp 98.1 F (36.7 C) (Oral)   Resp 12   Ht  5' 7.5" (1.715 m)   Wt 114 lb 3.2 oz (51.8 kg)   SpO2 98%   BMI 17.62 kg/m   BP Readings from Last 3 Encounters:  08/03/21 134/76  08/03/21 (!) 142/74  03/08/21 110/68    Wt Readings from Last 3 Encounters:  08/03/21 114 lb 3.2 oz (51.8 kg)  08/03/21 114 lb 6 oz (51.9 kg)  03/08/21 114 lb 9.6 oz (52 kg)    General appearance: alert, cooperative and appears stated age Ears: normal TM's and external ear canals both ears Throat: lips, mucosa, and tongue normal; teeth and gums normal Neck: no adenopathy, no  carotid bruit, supple, symmetrical, trachea midline and thyroid not enlarged, symmetric, no tenderness/mass/nodules Back: symmetric, no curvature. ROM normal. No CVA tenderness. Lungs: clear to auscultation bilaterally Heart: regular rate and rhythm, S1, S2 normal, no murmur, click, rub or gallop Abdomen: soft, non-tender; bowel sounds normal; no masses,  no organomegaly Pulses: 2+ and symmetric Skin: Skin color, texture, turgor normal. No rashes or lesions Lymph nodes: Cervical, supraclavicular, and axillary nodes normal.  Lab Results  Component Value Date   HGBA1C 4.2 (L) 12/22/2018    Lab Results  Component Value Date   CREATININE 0.82 08/03/2021   CREATININE 0.79 12/22/2020   CREATININE 0.63 10/06/2020    Lab Results  Component Value Date   WBC 12.0 (H) 02/16/2021   HGB 16.0 (H) 02/16/2021   HCT 45.6 02/16/2021   PLT 237 02/16/2021   GLUCOSE 96 08/03/2021   CHOL 119 12/22/2020   TRIG 69.0 12/22/2020   HDL 49.20 12/22/2020   LDLCALC 56 12/22/2020   ALT 19 08/03/2021   AST 26 08/03/2021   NA 139 08/03/2021   K 3.9 08/03/2021   CL 103 08/03/2021   CREATININE 0.82 08/03/2021   BUN 12 08/03/2021   CO2 28 08/03/2021   TSH 0.46 08/03/2021   INR 1.0 11/05/2016   HGBA1C 4.2 (L) 12/22/2018   MICROALBUR 0.6 01/11/2014    No results found.  Assessment & Plan:   Problem List Items Addressed This Visit       Unprioritized   Iatrogenic hypothyroidism   Relevant Orders   TSH (Completed)   Hyperlipidemia   Relevant Orders   Comprehensive metabolic panel (Completed)   Episode of recurrent major depressive disorder, unspecified depression episode severity ([place])    Positive depression screen,  Aggravated by increased responsibility of providing care for husband.  Trial of mirtazapine       Relevant Medications   mirtazapine (REMERON) 15 MG tablet   Osteoporosis    Secondary to alcohol and tobacco abuse and underweight .  Recommending Prolia given  Contraindications  to EVista .  If PA is not received,  Will prescribe weekly alendronate or refer for Reclast.       Other Visit Diagnoses     Vitamin D deficiency    -  Primary   Relevant Orders   VITAMIN D 25 Hydroxy (Vit-D Deficiency, Fractures) (Completed)      I spent 30 minutes dedicated to the care of this patient on the date of this encounter to include pre-visit review of his medical history,  Face-to-face time with the patient , and post visit ordering of testing and therapeutics.  Meds ordered this encounter  Medications   mirtazapine (REMERON) 15 MG tablet    Sig: Take 1 tablet (15 mg total) by mouth at bedtime.    Dispense:  30 tablet    Refill:  2    There  are no discontinued medications.  Follow-up: Return in about 4 weeks (around 08/31/2021).   [name], MD

## 2021-08-03 NOTE — Patient Instructions (Addendum)
You need 1800 mg calcium daily through diet and supplements. Use almond  milk   Change protein drink to ensure max protein  (more calories ) or add ice cream to [employer] also need vitamin D   1000 mcg daily   Start mirtazipine 1/2 tablet at bedtime for depression   Return in one month    Consider Prolia for treatment of osteoporosis

## 2021-08-03 NOTE — Addendum Note (Signed)
Addended by: Lamar Laundry on: 08/03/2021 10:50 AM   Modules accepted: Orders

## 2021-08-05 DIAGNOSIS — M810 Age-related osteoporosis without current pathological fracture: Secondary | ICD-10-CM | POA: Insufficient documentation

## 2021-08-05 NOTE — Assessment & Plan Note (Signed)
Positive depression screen,  Aggravated by increased responsibility of providing care for husband.  Trial of mirtazapine

## 2021-08-05 NOTE — Assessment & Plan Note (Signed)
Secondary to alcohol and tobacco abuse and underweight .  Recommending Prolia given  Contraindications to EVista .  If PA is not received,  Will prescribe weekly alendronate or refer for Reclast.

## 2021-08-14 DIAGNOSIS — Z0100 Encounter for examination of eyes and vision without abnormal findings: Secondary | ICD-10-CM | POA: Diagnosis not present

## 2021-08-14 DIAGNOSIS — H2513 Age-related nuclear cataract, bilateral: Secondary | ICD-10-CM | POA: Diagnosis not present

## 2021-08-15 ENCOUNTER — Telehealth: Payer: Self-pay | Admitting: *Deleted

## 2021-08-15 NOTE — Telephone Encounter (Signed)
Information submitted to [employer] for approval.

## 2021-08-15 NOTE — Telephone Encounter (Signed)
-----   Message from [name], MD sent at 08/05/2021 11:14 PM EDT ----- I would like to see if her insurance will pay for Prolia

## 2021-08-21 NOTE — Telephone Encounter (Signed)
Tracie Morris [place]: [id number] - PA Case ID: [id number]

## 2021-08-24 ENCOUNTER — Inpatient Hospital Stay: Payer: Medicare PPO | Attending: Oncology

## 2021-08-24 ENCOUNTER — Other Ambulatory Visit: Payer: Self-pay

## 2021-08-24 LAB — CBC WITH DIFFERENTIAL/PLATELET
Abs Immature Granulocytes: 0.05 10*3/uL (ref 0.00–0.07)
Basophils Absolute: 0.1 10*3/uL (ref 0.0–0.1)
Basophils Relative: 1 %
Eosinophils Absolute: 0.4 10*3/uL (ref 0.0–0.5)
Eosinophils Relative: 3 %
HCT: 44.7 % (ref 36.0–46.0)
Hemoglobin: 15.2 g/dL — ABNORMAL HIGH (ref 12.0–15.0)
Immature Granulocytes: 0 %
Lymphocytes Relative: 35 %
Lymphs Abs: 4.7 10*3/uL — ABNORMAL HIGH (ref 0.7–4.0)
MCH: 34.2 pg — ABNORMAL HIGH (ref 26.0–34.0)
MCHC: 34 g/dL (ref 30.0–36.0)
MCV: 100.4 fL — ABNORMAL HIGH (ref 80.0–100.0)
Monocytes Absolute: 0.9 10*3/uL (ref 0.1–1.0)
Monocytes Relative: 7 %
Neutro Abs: 7.1 10*3/uL (ref 1.7–7.7)
Neutrophils Relative %: 54 %
Platelets: 295 10*3/uL (ref 150–400)
RBC: 4.45 MIL/uL (ref 3.87–5.11)
RDW: 12.5 % (ref 11.5–15.5)
WBC: 13.2 10*3/uL — ABNORMAL HIGH (ref 4.0–10.5)
nRBC: 0 % (ref 0.0–0.2)

## 2021-08-24 LAB — IRON AND TIBC
Iron: 155 ug/dL (ref 28–170)
Saturation Ratios: 35 % — ABNORMAL HIGH (ref 10.4–31.8)
TIBC: 438 ug/dL (ref 250–450)
UIBC: 283 ug/dL

## 2021-08-24 LAB — FERRITIN: Ferritin: 35 ng/mL (ref 11–307)

## 2021-08-25 LAB — AFP TUMOR MARKER: AFP, Serum, Tumor Marker: 2.8 ng/mL (ref 0.0–9.2)

## 2021-08-28 NOTE — Progress Notes (Signed)
[facility]  [phone] [phone]  ID: Tracie Morris OB: 04/04/1956  MR#: [id number]  HBZ#:[id number]  Patient Care Team: [name], MD as PCP - General (Internal Medicine) Wellington Hampshire, MD as PCP - Cardiology (Cardiology) [name], MD (Internal Medicine)  CHIEF COMPLAINT: Hereditary hemochromatosis, compound heterozygote for C282Y and H63D mutations.  INTERVAL HISTORY: Patient returns to clinic today for routine 57-month evaluation.  She currently feels well and is asymptomatic.  She recently had a cholecystectomy and tolerated the procedure well. She has no neurologic complaints.  She denies any weight loss.  She denies any fevers.  She has no chest pain, shortness of breath, cough, or hemoptysis.  She denies any nausea, vomiting, constipation, or diarrhea.  She has no urinary complaints.  Patient feels at her baseline and offers no specific complaints today.  REVIEW OF SYSTEMS:   Review of Systems  Constitutional: Negative.  Negative for fever, malaise/fatigue and weight loss.  HENT: Negative.    Respiratory: Negative.  Negative for cough and shortness of breath.   Cardiovascular: Negative.  Negative for chest pain and leg swelling.  Gastrointestinal: Negative.  Negative for abdominal pain and diarrhea.  Genitourinary: Negative.  Negative for dysuria.  Musculoskeletal: Negative.  Negative for back pain.  Skin: Negative.  Negative for rash.  Neurological: Negative.  Negative for tremors, focal weakness, weakness and headaches.  Psychiatric/Behavioral: Negative.  The patient is not nervous/anxious.    As per HPI. Otherwise, a complete review of systems is negative.  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 Repeat angiography in November of 2014 showed 50% distal aortic stenosis extending into the left common iliac artery which had 60-70% ostial stenosis. No other significant disease. 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   R nasal tip/in situ, txt'd  with [facility]     PAST SURGICAL HISTORY: Past Surgical History:  Procedure Laterality Date   ABDOMINAL AORTAGRAM N/A 10/14/2013   Procedure: ABDOMINAL [name];  Surgeon: Wellington Hampshire, MD;  Location: [facility] CATH [facility];  Service: Cardiovascular;  Laterality: N/A;   ABDOMINAL AORTOGRAM W/LOWER EXTREMITY N/A 10/05/2020   Procedure: ABDOMINAL AORTOGRAM W/LOWER EXTREMITY;  Surgeon: Wellington Hampshire, MD;  Location: [facility];  Service: Cardiovascular;  Laterality: N/A;   ANGIOPLASTY ILLIAC ARTERY     CESAREAN SECTION     COLONOSCOPY WITH PROPOFOL N/A 09/22/2020   Procedure: COLONOSCOPY WITH PROPOFOL;  Surgeon: Lin Landsman, MD;  Location: [facility];  Service: Endoscopy;  Laterality: N/A;   NOSE SURGERY     PERIPHERAL VASCULAR CATHETERIZATION N/A 11/14/2016  Procedure: Abdominal Aortogram;  Surgeon: Wellington Hampshire, MD;  Location: [facility];  Service: Cardiovascular;  Laterality: N/A;   PERIPHERAL VASCULAR CATHETERIZATION Bilateral 11/14/2016   Procedure: Lower Extremity Angiography;  Surgeon: Wellington Hampshire, MD;  Location: [facility];   Service: Cardiovascular;  Laterality: Bilateral;  limited iliacs only   PERIPHERAL VASCULAR CATHETERIZATION Left 11/14/2016   Procedure: Peripheral Vascular Intervention;  Surgeon: Wellington Hampshire, MD;  Location: [place];  Service: Cardiovascular;  Laterality: Left;  common iliac   PERIPHERAL VASCULAR INTERVENTION  10/05/2020   Procedure: PERIPHERAL VASCULAR INTERVENTION;  Surgeon: Wellington Hampshire, MD;  Location: [facility] [facility];  Service: Cardiovascular;;  left common iliac    PERIPHERAL VASCULAR THROMBECTOMY  10/05/2020   Procedure: PERIPHERAL VASCULAR THROMBECTOMY;  Surgeon: Wellington Hampshire, MD;  Location: [place] [facility];  Service: Cardiovascular;;  Mechanical - Left common iliac, left internal iliac, and left SFA   POLYPECTOMY  09/22/2020   Procedure: POLYPECTOMY;  Surgeon: Lin Landsman, MD;  Location: [facility];  Service: Endoscopy;;   SKIN CANCER EXCISION     THYROID SURGERY  2007   secondary to thyroid cancer    FAMILY HISTORY: Family History  Problem Relation Age of Onset   Alzheimer's disease Father    Stroke Mother    Coronary artery disease Maternal Grandmother    Heart disease Maternal Grandmother 62   Coronary artery disease Paternal Grandmother 53   Heart disease Paternal Grandmother 36   Breast cancer Neg Hx     ADVANCED DIRECTIVES (Y/N):  N  HEALTH MAINTENANCE: Social History   Tobacco Use   Smoking status: Every Day    Packs/day: 1.00    Years: 20.00    Pack years: 20.00    Types: Cigarettes    Last attempt to quit: 10/28/2011    Years since quitting: 9.8   Smokeless tobacco: Never  Vaping Use   Vaping Use: Former  Substance Use Topics   Alcohol use: Not Currently   Drug use: No     Colonoscopy:  PAP:  Bone density:  Lipid panel:  Allergies  Allergen Reactions   Atorvastatin Other (See Comments)    Myalgias    Darvocet [Propoxyphene N-Acetaminophen] Nausea And Vomiting   Demerol Nausea And Vomiting    Methocarbamol Other (See Comments)    UNKNOWN   Mirtazapine     Sleeplessness, nausea    Primidone     Severe insomnia, myoclonus and parathesias    Ambien [Zolpidem Tartrate] Palpitations    Anxiety , trembling.    Current Outpatient Medications  Medication Sig Dispense Refill   aspirin EC 81 MG tablet Take 1 tablet (81 mg total) by mouth daily. Swallow whole.     clopidogrel (PLAVIX) 75 MG tablet TAKE 1 TABLET BY MOUTH ONCE DAILY 90 tablet 2   Colostrum 500 MG CAPS Take 500 mg by mouth daily.      Glycerin-Hypromellose-PEG 400 (DRY EYE RELIEF DROPS) 0.2-0.2-1 % SOLN Place 1 drop into both eyes daily as needed (Dry eye).     levothyroxine (SYNTHROID) 75 MCG tablet TAKE 1 TABLET BY MOUTH ONCE DAILY ON AN EMPTY STOMACH. WAIT 30 MINUTES BEFORE TAKING OTHER MEDS. 90 tablet 0   mirtazapine (REMERON) 15 MG tablet Take 1 tablet (15 mg total) by mouth at bedtime. 30 tablet 2   Multiple Vitamin (MULTIVITAMIN WITH MINERALS) TABS tablet Take 1 tablet by mouth daily.     propranolol (INDERAL) 10  MG tablet TAKE 1 TABLET BY MOUTH 3 TIMES DAILY AS NEEDED FOR TREMOR 90 tablet 3   rosuvastatin (CRESTOR) 40 MG tablet Take 1 tablet (40 mg total) by mouth daily. 90 tablet 2   No current facility-administered medications for this visit.    OBJECTIVE: Vitals:   08/29/21 1015  BP: 112/75  Pulse: (!) 105  Resp: 16  Temp: 97.6 F (36.4 C)     Body mass index is 18.5 kg/m.    ECOG FS:0 - Asymptomatic  General: Well-developed, well-nourished, no acute distress. Eyes: Pink conjunctiva, anicteric sclera. HEENT: Normocephalic, moist mucous membranes. Lungs: No audible wheezing or coughing. Heart: Regular rate and rhythm. Abdomen: Soft, nontender, no obvious distention. Musculoskeletal: No edema, cyanosis, or clubbing. Neuro: Alert, answering all questions appropriately. Cranial nerves grossly intact. Skin: No rashes or petechiae noted. Psych: Normal affect.  LAB RESULTS:  Lab Results  Component  Value Date   NA 139 08/03/2021   K 3.9 08/03/2021   CL 103 08/03/2021   CO2 28 08/03/2021   GLUCOSE 96 08/03/2021   BUN 12 08/03/2021   CREATININE 0.82 08/03/2021   CALCIUM 9.3 08/03/2021   PROT 7.2 08/03/2021   ALBUMIN 4.1 08/03/2021   AST 26 08/03/2021   ALT 19 08/03/2021   ALKPHOS 87 08/03/2021   BILITOT 0.6 08/03/2021   GFRNONAA >60 10/06/2020   GFRAA 109 08/30/2020    Lab Results  Component Value Date   WBC 13.2 (H) 08/24/2021   NEUTROABS 7.1 08/24/2021   HGB 15.2 (H) 08/24/2021   HCT 44.7 08/24/2021   MCV 100.4 (H) 08/24/2021   PLT 295 08/24/2021   Lab Results  Component Value Date   IRON 155 08/24/2021   TIBC 438 08/24/2021   IRONPCTSAT 35 (H) 08/24/2021   Lab Results  Component Value Date   FERRITIN 35 08/24/2021     STUDIES: No results found.   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  PLAN:    1. Hereditary hemochromatosis: Patient's hemoglobin and iron saturation remain mildly elevated, but remains decreased at 35.  She has not required phlebotomy in well over a year.  No intervention is needed at this time.  After lengthy discussion with the patient, it was agreed upon that no further follow-up is necessary.  Recommend continuing to check iron stores with ferritin 1-2 times per year.  If ferritin increases to greater than 100 please refer patient back for further evaluation and consideration of phlebotomy.   2.  Leukocytosis: Chronic and unchanged. 3.  History of liver lesion: Indeterminate.  Patient reports her primary care physician is aware and would prefer that she follow-up on any additional imaging if needed.   Patient expressed understanding and was in agreement with this plan. She also understands that She can call clinic at any time with any questions, concerns, or complaints.    Lloyd Huger, MD   08/30/2021 9:13 AM

## 2021-08-29 ENCOUNTER — Inpatient Hospital Stay: Payer: Medicare PPO | Attending: Oncology | Admitting: Oncology

## 2021-08-29 ENCOUNTER — Encounter: Payer: Self-pay | Admitting: Oncology

## 2021-08-29 DIAGNOSIS — Z7982 Long term (current) use of aspirin: Secondary | ICD-10-CM | POA: Insufficient documentation

## 2021-08-29 DIAGNOSIS — E039 Hypothyroidism, unspecified: Secondary | ICD-10-CM | POA: Diagnosis not present

## 2021-08-29 DIAGNOSIS — Z85850 Personal history of malignant neoplasm of thyroid: Secondary | ICD-10-CM | POA: Insufficient documentation

## 2021-08-29 DIAGNOSIS — D72829 Elevated white blood cell count, unspecified: Secondary | ICD-10-CM | POA: Insufficient documentation

## 2021-08-29 DIAGNOSIS — Z79899 Other long term (current) drug therapy: Secondary | ICD-10-CM | POA: Insufficient documentation

## 2021-08-29 DIAGNOSIS — F17210 Nicotine dependence, cigarettes, uncomplicated: Secondary | ICD-10-CM | POA: Diagnosis not present

## 2021-08-29 NOTE — Progress Notes (Signed)
Patient denies new problems/concerns today.   °

## 2021-08-29 NOTE — Telephone Encounter (Signed)
Prolia approved Patient can receive first dose at appointment on 09/06/21. Left message for patient to call office to notify patient of approval.

## 2021-08-30 ENCOUNTER — Encounter: Payer: Self-pay | Admitting: Oncology

## 2021-08-30 NOTE — Telephone Encounter (Signed)
Patient calling in and informed. Patient verbalized understanding

## 2021-09-05 ENCOUNTER — Ambulatory Visit (INDEPENDENT_AMBULATORY_CARE_PROVIDER_SITE_OTHER): Payer: Self-pay | Admitting: Dermatology

## 2021-09-05 ENCOUNTER — Other Ambulatory Visit: Payer: Self-pay

## 2021-09-05 DIAGNOSIS — L988 Other specified disorders of the skin and subcutaneous tissue: Secondary | ICD-10-CM

## 2021-09-05 NOTE — Patient Instructions (Signed)

## 2021-09-05 NOTE — Progress Notes (Signed)
   Follow-Up Visit   Subjective  Tracie Morris is a 65 y.o. female who presents for the following: Procedure (Patient presents for Botox injections today. She has had Botox in the past, about 2 years ago (~35 units). ). She has had some trouble in the past with the left brow dropping when done by another physician.   The following portions of the chart were reviewed this encounter and updated as appropriate:       Review of Systems:  No other skin or systemic complaints except as noted in HPI or Assessment and Plan.  Objective  Well appearing patient in no apparent distress; mood and affect are within normal limits.  A focused examination was performed including face. Relevant physical exam findings are noted in the Assessment and Plan.  face Rhytides and volume loss.             Assessment & Plan  Elastosis of skin face  Botox as marked. See photo.     Botox Injection - face Location: See attached image  Informed consent: Discussed risks (infection, pain, bleeding, bruising, swelling, allergic reaction, paralysis of nearby muscles, eyelid droop, double vision, neck weakness, difficulty breathing, headache, undesirable cosmetic result, and need for additional treatment) and benefits of the procedure, as well as the alternatives.  Informed consent was obtained.  Preparation: The area was cleansed with alcohol.  Procedure Details:  Botox was injected into the dermis with a 30-gauge needle. Pressure applied to any bleeding. Ice packs offered for swelling.  Lot Number:  [id number] Expiration:  02/2023  Total Units Injected:  29.5  Plan: Patient was instructed to remain upright for 4 hours. Patient was instructed to avoid massaging the face and avoid vigorous exercise for the rest of the day. Tylenol may be used for headache.  Allow 2 weeks before returning to clinic for additional dosing as needed. Patient will call for any problems.   Return as scheduled with Dr  Nicole Kindred.  IJamesetta Orleans, CMA, am acting as scribe for [name], MD .  Documentation: I have reviewed the above documentation for accuracy and completeness, and I agree with the above.  [name] MD

## 2021-09-06 ENCOUNTER — Ambulatory Visit: Payer: Medicare PPO | Admitting: Internal Medicine

## 2021-09-06 ENCOUNTER — Other Ambulatory Visit: Payer: Self-pay

## 2021-09-06 ENCOUNTER — Encounter: Payer: Self-pay | Admitting: Internal Medicine

## 2021-09-06 VITALS — BP 128/66 | HR 97 | Temp 96.7°F | Ht 67.5 in | Wt 122.6 lb

## 2021-09-06 DIAGNOSIS — M47812 Spondylosis without myelopathy or radiculopathy, cervical region: Secondary | ICD-10-CM | POA: Diagnosis not present

## 2021-09-06 DIAGNOSIS — E278 Other specified disorders of adrenal gland: Secondary | ICD-10-CM | POA: Diagnosis not present

## 2021-09-06 DIAGNOSIS — R911 Solitary pulmonary nodule: Secondary | ICD-10-CM | POA: Diagnosis not present

## 2021-09-06 DIAGNOSIS — Z23 Encounter for immunization: Secondary | ICD-10-CM

## 2021-09-06 DIAGNOSIS — K732 Chronic active hepatitis, not elsewhere classified: Secondary | ICD-10-CM | POA: Diagnosis not present

## 2021-09-06 DIAGNOSIS — E032 Hypothyroidism due to medicaments and other exogenous substances: Secondary | ICD-10-CM

## 2021-09-06 DIAGNOSIS — E440 Moderate protein-calorie malnutrition: Secondary | ICD-10-CM | POA: Diagnosis not present

## 2021-09-06 DIAGNOSIS — F339 Major depressive disorder, recurrent, unspecified: Secondary | ICD-10-CM | POA: Diagnosis not present

## 2021-09-06 DIAGNOSIS — K591 Functional diarrhea: Secondary | ICD-10-CM

## 2021-09-06 MED ORDER — VARENICLINE TARTRATE 1 MG PO TABS: 1.0000 mg | ORAL_TABLET | Freq: Two times a day (BID) | ORAL | 2 refills | Status: DC

## 2021-09-06 MED ORDER — VARENICLINE TARTRATE 0.5 MG X 11 & 1 MG X 42 PO MISC: ORAL | 0 refills | Status: DC

## 2021-09-06 MED ORDER — DENOSUMAB 60 MG/ML ~~LOC~~ SOSY
60.0000 mg | PREFILLED_SYRINGE | Freq: Once | SUBCUTANEOUS | Status: AC
  Administered 2021-09-06: 60 mg via SUBCUTANEOUS

## 2021-09-06 NOTE — Patient Instructions (Addendum)
Continue vitamin d as you are doing  Continue remeron at 15 mg daily   Chantix starting pack and continuing packs sent to pharmacy     I recommend getting the majority of your calcium and Vitamin D  through diet rather than supplements given the recent association of calcium supplements with increased coronary artery calcium scores.  You need 1200 mg calcium daily and 1000 IUs of Vit D (more if your level is low today) if you have osteopenia  Try using Tums,  Protein drinks (like Ensure,  Premier Protein, or Muscle Milk), soy,  almond and cashew milks which are fortified and carried by  most grocery stores   in the dairy  Section.   They are lactose free.  Soy milk should not be used if you have a history of breast cancer.   If you want to make your own protein drink, try the one made by  [facility] .  It comes in vanilla, chocolate and salted caramel ,  And is  plant based.  You can  Mix it with one of the non dairy milks,  And add PB2 peanut powder  (peanut butter 905 of the fat removed)  For a great breakfast    You can resume  biotin,  just suspend for 3 days prior to lab tests involving thyroid function   We will repeat your CBC and iron studies every 6 months (late march)

## 2021-09-06 NOTE — Assessment & Plan Note (Signed)
Improved ,  Sleeping 7 hours per night,  Gained 8 lbs in once moth dut to improved appetite still smoking but fewer,  Still can't taste  Certain things.

## 2021-09-06 NOTE — Progress Notes (Signed)
Subjective:  Patient ID: Tracie Morris, female    DOB: 01-Sep-1956  Age: 65 y.o. MRN: [id number]  CC: The primary encounter diagnosis was Osteoarthritis of cervical spine, unspecified spinal osteoarthritis complication status. Diagnoses of Episode of recurrent major depressive disorder, unspecified depression episode severity ([place]), Need for immunization against influenza, Adrenal mass, left ([place]), Chronic active hepatitis ([place]), Functional diarrhea, Iatrogenic hypothyroidism, Moderate protein-calorie malnutrition ([place]), and Solitary pulmonary nodule were also pertinent to this visit.  HPI Tracie Morris presents for follow upon depression,  underweight  Chief Complaint  Patient presents with   Follow-up    Follow up on depression and being under weight.     1) MDD:  has been taking remeron currently 15 mg daily  dose,  tolerating medication without side effects.  Feeling better,  sleeping better.   Increased appetite   but sense of taste  still limited to sweets.  2) Tobacco abuse: reviewed prior attempts to quit.  Wants to retry chantix   Outpatient Medications Prior to Visit  Medication Sig Dispense Refill   aspirin EC 81 MG tablet Take 1 tablet (81 mg total) by mouth daily. Swallow whole.     clopidogrel (PLAVIX) 75 MG tablet TAKE 1 TABLET BY MOUTH ONCE DAILY 90 tablet 2   Colostrum 500 MG CAPS Take 500 mg by mouth daily.      Glycerin-Hypromellose-PEG 400 (DRY EYE RELIEF DROPS) 0.2-0.2-1 % SOLN Place 1 drop into both eyes daily as needed (Dry eye).     mirtazapine (REMERON) 15 MG tablet Take 1 tablet (15 mg total) by mouth at bedtime. 30 tablet 2   Multiple Vitamin (MULTIVITAMIN WITH MINERALS) TABS tablet Take 1 tablet by mouth daily.     propranolol (INDERAL) 10 MG tablet TAKE 1 TABLET BY MOUTH 3 TIMES DAILY AS NEEDED FOR TREMOR 90 tablet 3   rosuvastatin (CRESTOR) 40 MG tablet Take 1 tablet (40 mg total) by mouth daily. 90 tablet 2   levothyroxine (SYNTHROID) 75 MCG tablet  TAKE 1 TABLET BY MOUTH ONCE DAILY ON AN EMPTY STOMACH. WAIT 30 MINUTES BEFORE TAKING OTHER MEDS. 90 tablet 0   No facility-administered medications prior to visit.    Review of Systems;  Patient denies headache, fevers, malaise, unintentional weight loss, skin rash, eye pain, sinus congestion and sinus pain, sore throat, dysphagia,  hemoptysis , cough, dyspnea, wheezing, chest pain, palpitations, orthopnea, edema, abdominal pain, nausea, melena, diarrhea, constipation, flank pain, dysuria, hematuria, urinary  Frequency, nocturia, numbness, tingling, seizures,  Focal weakness, Loss of consciousness,  Tremor, insomnia, depression, anxiety, and suicidal ideation.      Objective:  BP 128/66 (BP Location: Left Arm, Patient Position: Sitting, Cuff Size: Normal)   Pulse 97   Temp (!) 96.7 F (35.9 C) (Temporal)   Ht 5' 7.5" (1.715 m)   Wt 122 lb 9.6 oz (55.6 kg)   SpO2 96%   BMI 18.92 kg/m   BP Readings from Last 3 Encounters:  09/07/21 112/66  09/06/21 128/66  08/29/21 112/75    Wt Readings from Last 3 Encounters:  09/07/21 120 lb 12.8 oz (54.8 kg)  09/06/21 122 lb 9.6 oz (55.6 kg)  08/29/21 119 lb 14.4 oz (54.4 kg)    General appearance: alert, cooperative and appears stated age Ears: normal TM's and external ear canals both ears Throat: lips, mucosa, and tongue normal; teeth and gums normal Neck: no adenopathy, no carotid bruit, supple, symmetrical, trachea midline and thyroid not enlarged, symmetric, no tenderness/mass/nodules Back: symmetric, no  curvature. ROM normal. No CVA tenderness. Lungs: clear to auscultation bilaterally Heart: regular rate and rhythm, S1, S2 normal, no murmur, click, rub or gallop Abdomen: soft, non-tender; bowel sounds normal; no masses,  no organomegaly Pulses: 2+ and symmetric Skin: Skin color, texture, turgor normal. No rashes or lesions Lymph nodes: Cervical, supraclavicular, and axillary nodes normal. Psych: affect normal, makes good eye  contact. No fidgeting,  Smiles easily.  Denies suicidal thoughts    Lab Results  Component Value Date   HGBA1C 4.2 (L) 12/22/2018    Lab Results  Component Value Date   CREATININE 0.82 08/03/2021   CREATININE 0.79 12/22/2020   CREATININE 0.63 10/06/2020    Lab Results  Component Value Date   WBC 13.2 (H) 08/24/2021   HGB 15.2 (H) 08/24/2021   HCT 44.7 08/24/2021   PLT 295 08/24/2021   GLUCOSE 96 08/03/2021   CHOL 119 12/22/2020   TRIG 69.0 12/22/2020   HDL 49.20 12/22/2020   LDLCALC 56 12/22/2020   ALT 19 08/03/2021   AST 26 08/03/2021   NA 139 08/03/2021   K 3.9 08/03/2021   CL 103 08/03/2021   CREATININE 0.82 08/03/2021   BUN 12 08/03/2021   CO2 28 08/03/2021   TSH 0.23 (L) 09/07/2021   INR 1.0 11/05/2016   HGBA1C 4.2 (L) 12/22/2018   MICROALBUR 0.6 01/11/2014    No results found.  Assessment & Plan:   Problem List Items Addressed This Visit       Unprioritized   Iatrogenic hypothyroidism    Thyroid function is overactive on current dose.  I have sent a lower dose of levothyroxine to her pharmacy and would like patient to change  immediatley.   Lab Results  Component Value Date   TSH 0.23 (L) 09/07/2021         Relevant Medications   levothyroxine (SYNTHROID) 50 MCG tablet   Adrenal mass, left ([facility])    Incidentaloma.  Annual follow up by Dr [name]      Protein-calorie malnutrition [facility])    Nutritional status has improved with treatment of alcoholism and depression       Episode of recurrent major depressive disorder, unspecified depression episode severity ([place])    Improved ,  Sleeping 7 hours per night,  Gained 8 lbs in once moth dut to improved appetite still smoking but fewer,  Still can't taste  Certain things.       RESOLVED: Chronic active hepatitis [facility])      Lab Results  Component Value Date   ALT 19 08/03/2021   AST 26 08/03/2021   ALKPHOS 87 08/03/2021   BILITOT 0.6 08/03/2021         Osteoarthritis cervical spine -  Primary   Diarrhea    Colonoscopy done,  Normal biopsies .  Diarrhea resolved post procedure       Solitary pulmonary nodule    Serial CTs stopped in 2019 due to lack of appreciable change,.        Other Visit Diagnoses     Need for immunization against influenza       Relevant Orders   Flu Vaccine QUAD High Dose(Fluad) (Completed)      I spent 30 minutes dedicated to the care of this patient on the date of this encounter to include pre-visit review of her medical history,  most recent imaging studies, Face-to-face time with the patient , and post visit ordering of testing and therapeutics.    Meds ordered this encounter  Medications  denosumab (PROLIA) injection 60 mg    Order Specific Question:   Patient is enrolled in [employer] program for this medication and I have provided a copy of the Prolia Medication Guide and Patient Brochure.    Answer:   Yes    Order Specific Question:   I have reviewed with the patient the information in the Prolia Medication Guide and Patient Counseling Chart including the serious risks of Prolia and symptoms of each risk.    Answer:   Yes    Order Specific Question:   I have advised the patient to seek medical attention if they have signs or symptoms of any of the serious risks.    Answer:   Yes   varenicline (CHANTIX STARTING MONTH PAK) 0.5 MG X 11 & 1 MG X 42 tablet    Sig: Take one 0.5 mg tablet by mouth once daily for 3 days, then increase to one 0.5 mg tablet twice daily for 4 days, then increase to one 1 mg tablet twice daily.    Dispense:  53 tablet    Refill:  0   varenicline (CHANTIX CONTINUING MONTH PAK) 1 MG tablet    Sig: Take 1 tablet (1 mg total) by mouth 2 (two) times daily.    Dispense:  60 tablet    Refill:  2   levothyroxine (SYNTHROID) 50 MCG tablet    Sig: TAKE 1 TABLET BY MOUTH ONCE DAILY ON AN EMPTY STOMACH. WAIT 30 MINUTES BEFORE TAKING OTHER MEDS.    Dispense:  90 tablet    Refill:  1    Medications Discontinued During  This Encounter  Medication Reason   levothyroxine (SYNTHROID) 75 MCG tablet     Follow-up: Return in about 3 months (around 12/07/2021).   [name], MD

## 2021-09-07 ENCOUNTER — Ambulatory Visit: Payer: Medicare PPO | Admitting: Internal Medicine

## 2021-09-07 ENCOUNTER — Encounter: Payer: Self-pay | Admitting: Internal Medicine

## 2021-09-07 VITALS — BP 112/66 | HR 79 | Ht 67.5 in | Wt 120.8 lb

## 2021-09-07 DIAGNOSIS — E278 Other specified disorders of adrenal gland: Secondary | ICD-10-CM

## 2021-09-07 DIAGNOSIS — E890 Postprocedural hypothyroidism: Secondary | ICD-10-CM | POA: Diagnosis not present

## 2021-09-07 LAB — T4, FREE: Free T4: 1.04 ng/dL (ref 0.60–1.60)

## 2021-09-07 LAB — TSH: TSH: 0.23 u[IU]/mL — ABNORMAL LOW (ref 0.35–5.50)

## 2021-09-07 MED ORDER — LEVOTHYROXINE SODIUM 50 MCG PO TABS: ORAL_TABLET | ORAL | 1 refills | Status: DC

## 2021-09-07 NOTE — Assessment & Plan Note (Signed)
Nutritional status has improved with treatment of alcoholism and depression

## 2021-09-07 NOTE — Patient Instructions (Addendum)
Please continue Levothyroxine 75 mcg daily.  Take the thyroid hormone every day, with water, at least 30 minutes before breakfast, separated by at least 4 hours from: - acid reflux medications - calcium - iron - multivitamins  Please stop at the lab.  Please come back for a follow-up appointment in 1 year but with labs in 6 months.

## 2021-09-07 NOTE — Progress Notes (Signed)
Patient ID: Tracie Morris, female   DOB: 1956-06-17, 65 y.o.   MRN: [id number]  This visit occurred during the SARS-CoV-2 public health emergency.  Safety protocols were in place, including screening questions prior to the visit, additional usage of staff PPE, and extensive cleaning of exam room while observing appropriate contact time as indicated for disinfecting solutions.   HPI  Tracie Morris is a 65 y.o.-year-old female, presenting for f/u for adrenal incidentaloma and also postsurgical hypothyroidism. Last visit 09/2019.  Interim history: No increased tremors, no palpitations, no unintentional weight loss -in fact, she was able to gain 6 to 8 pounds recently. She had Covid 3x this year. She started on Remeron for increased depression but also increased appetite.  She mentions that this also helps her sleep better.  Reviewed history: Pt's adrenal mass was incidentally found during investigation for kidney stones in 01/2015.  Abdominal CT from 06/2015: Left adrenal nodule of 9 mm measuring -11 HU.  PET scan on 08/02/2015 and this nodule was not hypermetabolic.  Chest CT (05/21/2016): I have discussed with the radiologist that read the original CT scan (Dr. Ardeen Garland) >> her adrenal adenoma appears stable compared with a CT scan obtained in 2016.  Chest CT (02/13/2018): There is a stable adenoma in the left adrenal measuring 1.3 x 1.2 cm.  No h/o hypokalemia, HTN, hyperglycemia, spells of HA + HTN. She does have a h/o migraines, relieved by prn Plavix (!).   Reviewed previous investigations: urine metanephrines, normetanephrines and catecholamines were normal.  However, at last check, normetanephrine and total metanephrines were slightly elevated.  This is a nonspecific finding so we did not repeat the labs at that time.    Plasma renin activity and aldosterone levels have been normal  Dexamethasone suppression tests were normal.  Reviewed previous pertinent labs:  She had mild  elevation in metanephrines and normetanephrines, which is nonspecific:  Component     Latest Ref Rng & Units 02/08/2020  Epinephrine     pg/mL 42  Norepinephrine     pg/mL 744  Dopamine     pg/mL 17  Total Catecholamines     pg/mL 803  ALDOSTERONE     ** ng/dL 5  Renin Activity     0.25 - 5.82 ng/mL/h 0.21 (L)  ALDO / PRA Ratio     0.9 - 28.9 Ratio 23.8  Metanephrine, Pl     <=57 pg/mL 65 (H)  Normetanephrine, Pl     <=148 pg/mL 150 (H)  Total Metanephrines-Plasma     <=205 pg/mL 215 (H)  Potassium     3.5 - 5.3 mmol/L 3.5   Component     Latest Ref Rng & Units 02/24/2018  Epinephrine     pg/mL 31  Norepinephrine     pg/mL 697  Dopamine     pg/mL 30  Catecholamines, Total     pg/mL 728  Metanephrine, Pl     <=57 pg/mL <25  Normetanephrine, Pl     <=148 pg/mL 197 (H)  Total Metanephrines-Plasma     <=205 pg/mL 197  ALDOSTERONE      ng/dL 5  Renin Activity     0.25 - 5.82 ng/mL/h 0.44  ALDO / PRA Ratio     0.9 - 28.9 Ratio 11.4  Potassium     3.5 - 5.1 mEq/L 3.5   Component     Latest Ref Rng & Units 06/09/2018  Cortisol - AM     mcg/dL 2.6 (L)   Component  Latest Ref Rng & Units 02/18/2017          Epinephrine      <LLN  Norepinephrine     pg/mL 558  Dopamine      <LLN  Catecholamines, Total     pg/mL 558  PRA LC/MS/MS     0.25 - 5.82 ng/mL/h 0.33  ALDO / PRA Ratio     0.9 - 28.9 Ratio 9.1  ALDOSTERONE     ng/dL 3  Metanephrine, Pl     <=57 pg/mL 46  Normetanephrine, Pl     <=148 pg/mL 170 (H)  Total Metanephrines-Plasma     <=205 pg/mL 216 (H)   Component     Latest Ref Rng & Units 06/04/2017  Cortisol - AM     mcg/dL 2.9 (L)   Component     Latest Ref Rng 08/23/2015 08/31/2015 01/16/2016 02/06/2016  Total Volume - CF 24Hr U         1400  Epinephrine, 24 hr Urine         REPORT  Norepinephrine, 24 hr Ur     15 - 100 mcg/24 h    19  Calculated Total (E+NE)     26 - 121 mcg/24 h    19 (L)  Dopamine, 24 hr Urine     52 - 480  mcg/24 h    116  Creatinine, Urine mg/day-CATEUR     0.63 - 2.50 g/24 h    0.65  Epinephrine      REPORT     Norepinephrine      534     Dopamine      REPORT     Catecholamines, Total      534     PRA LC/MS/MS     0.25 - 5.82 ng/mL/h 0.47  1.63   ALDO / PRA Ratio     0.9 - 28.9 Ratio 6.4  6.7   ALDOSTERONE      3  11   Metanephrine, Pl     <=57 pg/mL 38     Normetanephrine, Pl     <=148 pg/mL 151 (H)     Total Metanephrines-Plasma     <=205 pg/mL 189     Metanephrines, Ur     90 - 315 mcg/24 h    51 (L)  Normetanephr.,U,24h     122 - 676 mcg/24 h    128  Metanephrine, Ur     224 - 832 mcg/24 h    179 (L)  Potassium     3.5 - 5.1 mEq/L 4.0     Cortisol, Plasma       1.2 9.0    Postsurgical hypothyroidism: Patient had total thyroidectomy in 2007 for presumed thyroid cancer - Dr Carlis Abbott (ENT); 1 parathyroid removed. Final pathology after thyroidectomy >> benign.   Pt is on levothyroxine 75 mcg daily (gradually decreased from 125 mcg), taken: - in am (4-6 am) - fasting - at least 30 min from b'fast - no Ca, Fe, PPIs - we moved MVIs later in the day  - not on Biotin (prev. 4000 mcg daily) No missed doses recently.  Her TFTs are fluctuating: Lab Results  Component Value Date   TSH 0.46 08/03/2021   TSH 0.31 (L) 05/26/2021   TSH 0.03 (L) 03/08/2021   TSH 0.11 (L) 12/22/2020   TSH 0.03 (L) 05/20/2020   TSH 0.07 (L) 02/08/2020   FREET4 1.18 05/26/2021   FREET4 1.48 03/08/2021   FREET4 1.30 05/20/2020  FREET4 1.44 02/08/2020   FREET4 0.77 02/23/2019   FREET4 1.19 12/22/2018   Pt denies: - feeling nodules in neck - hoarseness - dysphagia - choking - SOB with lying down  She has hereditary hemochromatosis. She has increased LFTs.  She is on propranolol for hand tremors >> helped significantly. OTW, she was about to have acranial procedure against it in case this did not work for her.  She has a lot of stress as husband has ESRD and very sick >> dying. She  feels depressed.  She had hiatal hernia repair (Nissen procedure).   ROS: + See HPI + Insomnia - after stopping Xanax -now better with Remeron, resolved hand tremors - on propranolol  I reviewed pt's medications, allergies, PMH, social hx, family hx, and changes were documented in the history of present illness. Otherwise, unchanged from my initial visit note.  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 Repeat angiography in November of 2014 showed 50% distal aortic stenosis extending into the left common iliac artery which had 60-70% ostial stenosis. No other significant disease. 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   R nasal tip/in situ, txt'd  with [facility]    Past Surgical History:  Procedure Laterality Date   ABDOMINAL AORTAGRAM N/A 10/14/2013   Procedure: ABDOMINAL [name];  Surgeon: Wellington Hampshire, MD;  Location: [place] [facility];  Service: Cardiovascular;  Laterality: N/A;   ABDOMINAL AORTOGRAM W/LOWER EXTREMITY N/A 10/05/2020   Procedure: ABDOMINAL AORTOGRAM W/LOWER EXTREMITY;  Surgeon: Wellington Hampshire, MD;  Location: [place] [facility];  Service: Cardiovascular;  Laterality: N/A;   ANGIOPLASTY ILLIAC ARTERY     CESAREAN  SECTION     COLONOSCOPY WITH PROPOFOL N/A 09/22/2020   Procedure: COLONOSCOPY WITH PROPOFOL;  Surgeon: Lin Landsman, MD;  Location: [place];  Service: Endoscopy;  Laterality: N/A;   NOSE SURGERY     PERIPHERAL VASCULAR CATHETERIZATION N/A 11/14/2016   Procedure: Abdominal Aortogram;  Surgeon: Wellington Hampshire, MD;  Location: [place] [facility];  Service: Cardiovascular;  Laterality: N/A;   PERIPHERAL VASCULAR CATHETERIZATION Bilateral 11/14/2016   Procedure: Lower Extremity Angiography;  Surgeon: Wellington Hampshire, MD;  Location: [place] [facility];  Service: Cardiovascular;  Laterality: Bilateral;  limited iliacs only   PERIPHERAL VASCULAR CATHETERIZATION Left 11/14/2016   Procedure: Peripheral Vascular Intervention;  Surgeon: Wellington Hampshire, MD;  Location: [place] [facility];  Service: Cardiovascular;  Laterality: Left;  common iliac   PERIPHERAL VASCULAR INTERVENTION  10/05/2020   Procedure: PERIPHERAL VASCULAR INTERVENTION;  Surgeon: Wellington Hampshire, MD;  Location: [place] [facility];  Service: Cardiovascular;;  left common  iliac    PERIPHERAL VASCULAR THROMBECTOMY  10/05/2020   Procedure: PERIPHERAL VASCULAR THROMBECTOMY;  Surgeon: Wellington Hampshire, MD;  Location: [place] [facility];  Service: Cardiovascular;;  Mechanical - Left common iliac, left internal iliac, and left SFA   POLYPECTOMY  09/22/2020   Procedure: POLYPECTOMY;  Surgeon: Lin Landsman, MD;  Location: [place];  Service: Endoscopy;;   SKIN CANCER EXCISION     THYROID SURGERY  2007   secondary to thyroid cancer   Social History   Social History   Marital Status: Married    Spouse Name: N/A   Number of Children: 1   Occupational History   Freight forwarder    Social History Main Topics   Smoking status: Former Smoker -- 1.00 packs/day for 20 years    Types: Cigarettes    Quit date: 10/28/2011   Smokeless tobacco: Never Used   Alcohol Use: No   Drug Use: No   Current Outpatient  Medications on File Prior to Visit  Medication Sig Dispense Refill   aspirin EC 81 MG tablet Take 1 tablet (81 mg total) by mouth daily. Swallow whole.     clopidogrel (PLAVIX) 75 MG tablet TAKE 1 TABLET BY MOUTH ONCE DAILY 90 tablet 2   Colostrum 500 MG CAPS Take 500 mg by mouth daily.      Glycerin-Hypromellose-PEG 400 (DRY EYE RELIEF DROPS) 0.2-0.2-1 % SOLN Place 1 drop into both eyes daily as needed (Dry eye).     levothyroxine (SYNTHROID) 75 MCG tablet TAKE 1 TABLET BY MOUTH ONCE DAILY ON AN EMPTY STOMACH. WAIT 30 MINUTES BEFORE TAKING OTHER MEDS. 90 tablet 0   mirtazapine (REMERON) 15 MG tablet Take 1 tablet (15 mg total) by mouth at bedtime. 30 tablet 2   Multiple Vitamin (MULTIVITAMIN WITH MINERALS) TABS tablet Take 1 tablet by mouth daily.     propranolol (INDERAL) 10 MG tablet TAKE 1 TABLET BY MOUTH 3 TIMES DAILY AS NEEDED FOR TREMOR 90 tablet 3   rosuvastatin (CRESTOR) 40 MG tablet Take 1 tablet (40 mg total) by mouth daily. 90 tablet 2   [START ON 10/07/2021] varenicline (CHANTIX CONTINUING MONTH PAK) 1 MG tablet Take 1 tablet (1 mg total) by mouth 2 (two) times daily. 60 tablet 2   varenicline (CHANTIX STARTING MONTH PAK) 0.5 MG X 11 & 1 MG X 42 tablet Take one 0.5 mg tablet by mouth once daily for 3 days, then increase to one 0.5 mg tablet twice daily for 4 days, then increase to one 1 mg tablet twice daily. 53 tablet 0   No current facility-administered medications on file prior to visit.   Allergies  Allergen Reactions   Atorvastatin Other (See Comments)    Myalgias    Darvocet [Propoxyphene N-Acetaminophen] Nausea And Vomiting   Demerol Nausea And Vomiting   Methocarbamol Other (See Comments)    UNKNOWN   Mirtazapine     Sleeplessness, nausea    Primidone     Severe insomnia, myoclonus and parathesias    Ambien [Zolpidem Tartrate] Palpitations    Anxiety , trembling.   Family History  Problem Relation Age of Onset   Alzheimer's disease Father    Stroke Mother     Coronary artery disease Maternal Grandmother    Heart disease Maternal Grandmother 82   Coronary artery disease Paternal Grandmother 20   Heart disease Paternal Grandmother 28   Breast cancer Neg Hx    PE: BP 112/66 (BP Location: Right Arm, Patient Position: Sitting, Cuff  Size: Normal)   Pulse 79   Ht 5' 7.5" (1.715 m)   Wt 120 lb 12.8 oz (54.8 kg)   SpO2 97%   BMI 18.64 kg/m  Body mass index is 18.64 kg/m.  Wt Readings from Last 3 Encounters:  09/07/21 120 lb 12.8 oz (54.8 kg)  09/06/21 122 lb 9.6 oz (55.6 kg)  08/29/21 119 lb 14.4 oz (54.4 kg)   Constitutional: normal weight, in NAD Eyes: PERRLA, EOMI, no exophthalmos ENT: moist mucous membranes, no thyromegaly, no cervical lymphadenopathy Cardiovascular: RRR, No MRG Respiratory: CTA B Gastrointestinal: abdomen soft, NT, ND, BS+ Musculoskeletal: no deformities, strength intact in all 4 Skin: moist, warm, no rashes Neurological: no tremor with outstretched hands, DTR normal in all 4  ASSESSMENT: 1. Adrenal incidentaloma  2. Postsurgical hypothyroidism  3. Presumed ThyCA I received the report of her thyroid pathology from [facility], from 02/06/2006: - Total thyroidectomy: A. Right thyroid: Nodular hyperplasia B. Left thyroid: Nodular hyperplasia with 1.9 cm dominant adenomatoid nodule. Parathyroid tissue in a single section, measuring 0.7 mm. Conclusion: There is nodular hyperplasia (multinodular goiter). There is no evidence of malignancy.   PLAN:  1. Patient with a 1 cm left adrenal nodule discovered incidentally during investigation for kidney stones -Previous hormonal work-up and imaging indicated a benign, nonsecreting nodule.  She has slightly elevated metanephrines and normetanephrine's in the past, which is nonspecific and not indicative of pheochromocytoma.  A dexamethasone suppression test was normal, ruling out Cushing syndrome.  Aldosterone and plasma renin activity were also normal,  ruling out primary hyperaldosteronism. -Adrenal hormones were stable over 6 years, so further investigation is not needed for this  2. Hypothyroidism -Patient with history of uncontrolled hypothyroidism, with fluctuating TFTs - - latest thyroid labs reviewed with pt. >> normal: Lab Results  Component Value Date   TSH 0.46 08/03/2021  - she continues on LT4 75 mcg daily - pt feels good on this dose. - we discussed about taking the thyroid hormone every day, with water, >30 minutes before breakfast, separated by >4 hours from acid reflux medications, calcium, iron, multivitamins. Pt. is taking it correctly. - will check thyroid tests today: TSH and fT4 - If labs are abnormal, she will need to return for repeat TFTs in 1.5 months -I will see her back in 6 months.  Component     Latest Ref Rng & Units 09/07/2021  TSH     0.35 - 5.50 uIU/mL 0.23 (L)  T4,Free(Direct)     0.60 - 1.60 ng/dL 1.04  TSH is  too low.  I will advise her to take the full tablet 6 out of 7 days and only half a tablet 1 out of 7 days, for a daily average of 64 mcg daily.  We will plan to recheck the tests in 1.5 months.  Philemon Kingdom, MD PhD [facility]

## 2021-09-07 NOTE — Assessment & Plan Note (Signed)
Colonoscopy done,  Normal biopsies .  Diarrhea resolved post procedure

## 2021-09-07 NOTE — Assessment & Plan Note (Signed)
Serial CTs stopped in 2019 due to lack of appreciable change,.

## 2021-09-07 NOTE — Assessment & Plan Note (Signed)
   Lab Results  Component Value Date   ALT 19 08/03/2021   AST 26 08/03/2021   ALKPHOS 87 08/03/2021   BILITOT 0.6 08/03/2021

## 2021-09-07 NOTE — Assessment & Plan Note (Signed)
Incidentaloma.  Annual follow up by Dr [name]

## 2021-09-07 NOTE — Assessment & Plan Note (Signed)
Thyroid function is overactive on current dose.  I have sent a lower dose of levothyroxine to her pharmacy and would like patient to change  immediatley.   Lab Results  Component Value Date   TSH 0.23 (L) 09/07/2021

## 2021-09-08 ENCOUNTER — Other Ambulatory Visit: Payer: Self-pay | Admitting: Internal Medicine

## 2021-09-11 ENCOUNTER — Ambulatory Visit: Payer: Medicare PPO | Admitting: Dermatology

## 2021-09-11 ENCOUNTER — Other Ambulatory Visit: Payer: Self-pay

## 2021-09-12 ENCOUNTER — Other Ambulatory Visit: Payer: Self-pay

## 2021-09-12 ENCOUNTER — Ambulatory Visit
Admission: RE | Admit: 2021-09-12 | Discharge: 2021-09-12 | Disposition: A | Discharge status: Home / Self Care | Payer: Medicare PPO | Source: Ambulatory Visit | Attending: Internal Medicine | Admitting: Internal Medicine

## 2021-09-12 DIAGNOSIS — Z1231 Encounter for screening mammogram for malignant neoplasm of breast: Secondary | ICD-10-CM | POA: Diagnosis not present

## 2021-09-12 MED ORDER — MIRTAZAPINE 15 MG PO TABS: 15.0000 mg | ORAL_TABLET | Freq: Every day | ORAL | 1 refills | Status: DC

## 2021-10-03 ENCOUNTER — Ambulatory Visit (INDEPENDENT_AMBULATORY_CARE_PROVIDER_SITE_OTHER): Payer: Medicare PPO | Admitting: Dermatology

## 2021-10-03 ENCOUNTER — Other Ambulatory Visit: Payer: Self-pay

## 2021-10-03 DIAGNOSIS — L988 Other specified disorders of the skin and subcutaneous tissue: Secondary | ICD-10-CM | POA: Diagnosis not present

## 2021-10-03 DIAGNOSIS — L814 Other melanin hyperpigmentation: Secondary | ICD-10-CM

## 2021-10-03 NOTE — Patient Instructions (Signed)

## 2021-10-03 NOTE — Progress Notes (Signed)
   Follow-Up Visit   Subjective  Tracie Morris is a 65 y.o. female who presents for the following: Follow-up.  Patient here for 1 month follow-up Botox injections. Patient pleased with glabella and forehead lines. Her brows are asymmetrical, as were before Botox injections. She would like to try and raise her left brow a little more to match the right brow.   The following portions of the chart were reviewed this encounter and updated as appropriate:       Review of Systems:  No other skin or systemic complaints except as noted in HPI or Assessment and Plan.  Objective  Well appearing patient in no apparent distress; mood and affect are within normal limits.  A focused examination was performed including face. Relevant physical exam findings are noted in the Assessment and Plan.  Left Lateral Eyebrow Rhytides and volume loss.  R eyebrow with higher arch than left eyebrow          Assessment & Plan   Lentigines - Scattered tan macules - Due to sun exposure - Benign-appering, observe - Recommend daily broad spectrum sunscreen SPF 30+ to sun-exposed areas, reapply every 2 hours as needed. - Call for any changes - Discussed BBL vrs LN2. Patient defers LN2 today. Patient may consider BBL in the future.   Elastosis of skin Left Lateral Eyebrow  Botox injections today as marked. To achieve increased brow lift on left since she is asymmetrical at baseline  Discussed filler (Restylane Defyne) to bilateral oral commissure, nasolabial, chin.   Botox Injection - Left Lateral Eyebrow Location: See attached image  Informed consent: Discussed risks (infection, pain, bleeding, bruising, swelling, allergic reaction, paralysis of nearby muscles, eyelid droop, double vision, neck weakness, difficulty breathing, headache, undesirable cosmetic result, and need for additional treatment) and benefits of the procedure, as well as the alternatives.  Informed consent was  obtained.  Preparation: The area was cleansed with alcohol.  Procedure Details:  Botox was injected into the dermis with a 30-gauge needle. Pressure applied to any bleeding. Ice packs offered for swelling.  Lot Number:  [id number] Expiration:  06/2023  Total Units Injected:  4  Plan: Patient was instructed to remain upright for 4 hours. Patient was instructed to avoid massaging the face and avoid vigorous exercise for the rest of the day. Tylenol may be used for headache.  Allow 2 weeks before returning to clinic for additional dosing as needed. Patient will call for any problems.   Return as scheduled for TBSE.  IJamesetta Orleans, CMA, am acting as scribe for [name], MD .  Documentation: I have reviewed the above documentation for accuracy and completeness, and I agree with the above.  [name] MD

## 2021-10-07 ENCOUNTER — Other Ambulatory Visit: Payer: Self-pay | Admitting: Cardiovascular Disease

## 2021-12-07 ENCOUNTER — Ambulatory Visit: Payer: Medicare PPO | Admitting: Internal Medicine

## 2021-12-07 ENCOUNTER — Encounter: Payer: Self-pay | Admitting: Internal Medicine

## 2021-12-07 ENCOUNTER — Other Ambulatory Visit: Payer: Self-pay

## 2021-12-07 VITALS — BP 124/64 | HR 74 | Temp 99.0°F | Ht 67.5 in | Wt 125.6 lb

## 2021-12-07 DIAGNOSIS — E7800 Pure hypercholesterolemia, unspecified: Secondary | ICD-10-CM

## 2021-12-07 DIAGNOSIS — F339 Major depressive disorder, recurrent, unspecified: Secondary | ICD-10-CM | POA: Diagnosis not present

## 2021-12-07 DIAGNOSIS — G252 Other specified forms of tremor: Secondary | ICD-10-CM | POA: Diagnosis not present

## 2021-12-07 DIAGNOSIS — E278 Other specified disorders of adrenal gland: Secondary | ICD-10-CM | POA: Diagnosis not present

## 2021-12-07 DIAGNOSIS — R5383 Other fatigue: Secondary | ICD-10-CM | POA: Diagnosis not present

## 2021-12-07 DIAGNOSIS — I798 Other disorders of arteries, arterioles and capillaries in diseases classified elsewhere: Secondary | ICD-10-CM

## 2021-12-07 DIAGNOSIS — D72820 Lymphocytosis (symptomatic): Secondary | ICD-10-CM | POA: Diagnosis not present

## 2021-12-07 LAB — CBC WITH DIFFERENTIAL/PLATELET
Basophils Absolute: 0.1 10*3/uL (ref 0.0–0.1)
Basophils Relative: 0.8 % (ref 0.0–3.0)
Eosinophils Absolute: 0.3 10*3/uL (ref 0.0–0.7)
Eosinophils Relative: 2.2 % (ref 0.0–5.0)
HCT: 44.6 % (ref 36.0–46.0)
Hemoglobin: 14.7 g/dL (ref 12.0–15.0)
Lymphocytes Relative: 35.7 % (ref 12.0–46.0)
Lymphs Abs: 4.6 10*3/uL — ABNORMAL HIGH (ref 0.7–4.0)
MCHC: 32.8 g/dL (ref 30.0–36.0)
MCV: 99 fl (ref 78.0–100.0)
Monocytes Absolute: 0.6 10*3/uL (ref 0.1–1.0)
Monocytes Relative: 4.7 % (ref 3.0–12.0)
Neutro Abs: 7.3 10*3/uL (ref 1.4–7.7)
Neutrophils Relative %: 56.6 % (ref 43.0–77.0)
Platelets: 238 10*3/uL (ref 150.0–400.0)
RBC: 4.5 Mil/uL (ref 3.87–5.11)
RDW: 12.9 % (ref 11.5–15.5)
WBC: 12.9 10*3/uL — ABNORMAL HIGH (ref 4.0–10.5)

## 2021-12-07 LAB — LIPID PANEL
Cholesterol: 123 mg/dL (ref 0–200)
HDL: 46.3 mg/dL (ref >39.00)
LDL Cholesterol: 63 mg/dL (ref 0–99)
NonHDL: 77.02
Total CHOL/HDL Ratio: 3
Triglycerides: 70 mg/dL (ref 0.0–149.0)
VLDL: 14 mg/dL (ref 0.0–40.0)

## 2021-12-07 LAB — COMPREHENSIVE METABOLIC PANEL
ALT: 12 U/L (ref 0–35)
AST: 20 U/L (ref 0–37)
Albumin: 4.2 g/dL (ref 3.5–5.2)
Alkaline Phosphatase: 62 U/L (ref 39–117)
BUN: 13 mg/dL (ref 6–23)
CO2: 29 mEq/L (ref 19–32)
Calcium: 9 mg/dL (ref 8.4–10.5)
Chloride: 105 mEq/L (ref 96–112)
Creatinine, Ser: 0.81 mg/dL (ref 0.40–1.20)
GFR: 75.63 mL/min (ref >60.00)
Glucose, Bld: 89 mg/dL (ref 70–99)
Potassium: 3.9 mEq/L (ref 3.5–5.1)
Sodium: 139 mEq/L (ref 135–145)
Total Bilirubin: 0.4 mg/dL (ref 0.2–1.2)
Total Protein: 7.1 g/dL (ref 6.0–8.3)

## 2021-12-07 LAB — TSH: TSH: 4.2 u[IU]/mL (ref 0.35–5.50)

## 2021-12-07 MED ORDER — ROSUVASTATIN CALCIUM 40 MG PO TABS: 40.0000 mg | ORAL_TABLET | Freq: Every day | ORAL | 0 refills | Status: DC

## 2021-12-07 NOTE — Patient Instructions (Addendum)
For your insomnia:  Increase the mirtazapine to 30 mg daily .  If you do not tolerate this dose,  we will reduce the dose back to 15 mg and add one of the following medications :    (lunesta and trazodone) are our next choices   CBD has been claimed to be beneficial in many conditions. Research of CBD is difficult and access is limited due to state and federal regulations and therefore we are legally not allowed to recommend it. Patient can get it on her own, I'm not actually able to give her instructions on how to increase or decrease dosing--there are no guidelines for it, and because every supplier is different, I can't really be sure what any given dose would do for her condition. There are no guarantees of the concentration, purity, or contaminants on the products available over the counter. CBD oil is not the same as THC (Tetrahydrocannabinol) which is the ingredient in marijuana that causes psychoactive effects (high), there is still a chance that they could test positive for THC on a Urine drug screen.

## 2021-12-07 NOTE — Progress Notes (Signed)
Subjective:  Patient ID: Tracie Morris, female    DOB: [birth year]  Age: 66 y.o. MRN: [id number]  CC: The primary encounter diagnosis was Pure hypercholesterolemia. Diagnoses of Fatigue, unspecified type, Persistent lymphocytosis, Hereditary hemochromatosis ([place]), Adrenal mass, left ([place]), Peripheral vascular disease, secondary ([place]), Intention tremor, and Episode of recurrent major depressive disorder, unspecified depression episode severity ([place]) were also pertinent to this visit.   This visit occurred during the SARS-CoV-2 public health emergency.  Safety protocols were in place, including screening questions prior to the visit, additional usage of staff PPE, and extensive cleaning of exam room while observing appropriate contact time as indicated for disinfecting solutions.    HPI Tracie Morris presents for  Chief Complaint  Patient presents with   Follow-up   Medication Reaction    Possible side effects of Chantix causing her ti feel tired & weak.     1) tobacco abuse;  Started taking Chantix recently.  Has felt tired and weak since starting it ,  but the holidays admittedly tired her out due to cooking for mother and family  prior to starting chantix , which she started around Thanksgiving.   Has trouble managing all medications due to timing of everything  .. the chantix is helping .  CCM referral offered but deferred.   2) Hyperlipidemia :  taking crestor.   3) underweight:  steadily gaining weight   4) Hypothyroid:  taking levothyroxine ,  waits 2 hours,  then propranolol for tremor,  then eats breakfast . Takes the rest of meds and Chantix with lunch.  Takes plavix and ASA with a meal.   5) Depresssion with insomnia:  mood has improved along with appetite,  taking 15 mg remeron at bedtime..  used to work but now no longer effective for insomnia .  Lies awake till 5 am some days.  History of sleep walking with ambien.  No previous trials of lunesta or trazodone . Halcion  made her sleep walk. Alprazolam worked,  but wants to avoid oversedation and benzo's given her history of alcohol  abuse . Melatonin makes her wired but has not tried taking it at dinner time.   6) PAD;  asymptomatic .  Last visit with vascular was reviewed.     Outpatient Medications Prior to Visit  Medication Sig Dispense Refill   aspirin EC 81 MG tablet Take 1 tablet (81 mg total) by mouth daily. Swallow whole.     clopidogrel (PLAVIX) 75 MG tablet TAKE 1 TABLET BY MOUTH ONCE DAILY 90 tablet 2   Colostrum 500 MG CAPS Take 500 mg by mouth daily.      Glycerin-Hypromellose-PEG 400 (DRY EYE RELIEF DROPS) 0.2-0.2-1 % SOLN Place 1 drop into both eyes daily as needed (Dry eye).     levothyroxine (SYNTHROID) 50 MCG tablet TAKE 1 TABLET BY MOUTH ONCE DAILY ON AN EMPTY STOMACH. WAIT 30 MINUTES BEFORE TAKING OTHER MEDS. 90 tablet 1   mirtazapine (REMERON) 15 MG tablet Take 1 tablet (15 mg total) by mouth at bedtime. 90 tablet 1   Multiple Vitamin (MULTIVITAMIN WITH MINERALS) TABS tablet Take 1 tablet by mouth daily.     propranolol (INDERAL) 10 MG tablet TAKE 1 TABLET BY MOUTH 3 TIMES DAILY AS NEEDED FOR TREMOR 90 tablet 3   varenicline (CHANTIX CONTINUING MONTH PAK) 1 MG tablet Take 1 tablet (1 mg total) by mouth 2 (two) times daily. 60 tablet 2   varenicline (CHANTIX STARTING MONTH PAK) 0.5 MG X 11 &  1 MG X 42 tablet Take one 0.5 mg tablet by mouth once daily for 3 days, then increase to one 0.5 mg tablet twice daily for 4 days, then increase to one 1 mg tablet twice daily. 53 tablet 0   rosuvastatin (CRESTOR) 40 MG tablet TAKE 1 TABLET BY MOUTH ONCE DAILY 90 tablet 0   No facility-administered medications prior to visit.    Review of Systems;  Patient denies headache, fevers, malaise, unintentional weight loss, skin rash, eye pain, sinus congestion and sinus pain, sore throat, dysphagia,  hemoptysis , cough, dyspnea, wheezing, chest pain, palpitations, orthopnea, edema, abdominal pain, nausea,  melena, diarrhea, constipation, flank pain, dysuria, hematuria, urinary  Frequency, nocturia, numbness, tingling, seizures,  Focal weakness, Loss of consciousness,  Tremor, insomnia, depression, anxiety, and suicidal ideation.      Objective:  BP 124/64 (BP Location: Left Arm, Patient Position: Sitting, Cuff Size: Normal)    Pulse 74    Temp 99 F (37.2 C) (Oral)    Ht 5' 7.5" (1.715 m)    Wt 125 lb 9.6 oz (57 kg)    SpO2 96%    BMI 19.38 kg/m   BP Readings from Last 3 Encounters:  12/07/21 124/64  09/07/21 112/66  09/06/21 128/66    Wt Readings from Last 3 Encounters:  12/07/21 125 lb 9.6 oz (57 kg)  09/07/21 120 lb 12.8 oz (54.8 kg)  09/06/21 122 lb 9.6 oz (55.6 kg)    General appearance: alert, cooperative and appears stated age Ears: normal TM's and external ear canals both ears Throat: lips, mucosa, and tongue normal; teeth and gums normal Neck: no adenopathy, no carotid bruit, supple, symmetrical, trachea midline and thyroid not enlarged, symmetric, no tenderness/mass/nodules Back: symmetric, no curvature. ROM normal. No CVA tenderness. Lungs: clear to auscultation bilaterally Heart: regular rate and rhythm, S1, S2 normal, no murmur, click, rub or gallop Abdomen: soft, non-tender; bowel sounds normal; no masses,  no organomegaly Pulses: 2+ and symmetric Skin: Skin color, texture, turgor normal. No rashes or lesions Lymph nodes: Cervical, supraclavicular, and axillary nodes normal.  Lab Results  Component Value Date   HGBA1C 4.2 (L) 12/22/2018    Lab Results  Component Value Date   CREATININE 0.81 12/07/2021   CREATININE 0.82 08/03/2021   CREATININE 0.79 12/22/2020    Lab Results  Component Value Date   WBC 12.9 (H) 12/07/2021   HGB 14.7 12/07/2021   HCT 44.6 12/07/2021   PLT 238.0 12/07/2021   GLUCOSE 89 12/07/2021   CHOL 123 12/07/2021   TRIG 70.0 12/07/2021   HDL 46.30 12/07/2021   LDLCALC 63 12/07/2021   ALT 12 12/07/2021   AST 20 12/07/2021   NA 139  12/07/2021   K 3.9 12/07/2021   CL 105 12/07/2021   CREATININE 0.81 12/07/2021   BUN 13 12/07/2021   CO2 29 12/07/2021   TSH 4.20 12/07/2021   INR 1.0 11/05/2016   HGBA1C 4.2 (L) 12/22/2018   MICROALBUR 0.6 01/11/2014    MM 3D SCREEN BREAST BILATERAL  Result Date: 09/15/2021 CLINICAL DATA:  Screening. EXAM: DIGITAL SCREENING BILATERAL MAMMOGRAM WITH TOMOSYNTHESIS AND CAD TECHNIQUE: Bilateral screening digital craniocaudal and mediolateral oblique mammograms were obtained. Bilateral screening digital breast tomosynthesis was performed. The images were evaluated with computer-aided detection. COMPARISON:  Previous exam(s). ACR Breast Density Category c: The breast tissue is heterogeneously dense, which may obscure small masses. FINDINGS: There are no findings suspicious for malignancy. IMPRESSION: No mammographic evidence of malignancy. A result letter of this screening  mammogram will be mailed directly to the patient. RECOMMENDATION: Screening mammogram in one year. (Code:SM-B-01Y) BI-RADS CATEGORY  1: Negative. Electronically Signed   By: Lajean Manes M.D.   On: 09/15/2021 15:59   Assessment & Plan:   Problem List Items Addressed This Visit     Hyperlipidemia - Primary    Managed with high potency sttin (Crestor 40 mg )   LDL is at goal  Lab Results  Component Value Date   CHOL 123 12/07/2021   HDL 46.30 12/07/2021   LDLCALC 63 12/07/2021   TRIG 70.0 12/07/2021   CHOLHDL 3 12/07/2021         Relevant Orders   Lipid panel (Completed)   Peripheral vascular disease, secondary ([place])    She remains asymptomatic , taking asa and plavix indefinitely  .  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  Annual follow up by Dr [name]      Hereditary hemochromatosis [facility])    hgb remains normal.   Lab Results  Component Value Date   WBC 12.9 (H) 12/07/2021   HGB 14.7 12/07/2021   HCT 44.6 12/07/2021   MCV 99.0  12/07/2021   PLT 238.0 12/07/2021         Intention tremor    continue propranolol.  No longer abusing alcohol       Episode of recurrent major depressive disorder, unspecified depression episode severity ([place])    Improved ,  eccept for insomnia. Gaining weight,  Quitting smoking.  Increase mirtazipine to 30 mg. Discussed the alternative if  Higher dose is  not tolerated:  Resume  15 mg ,  Add trazodone or lunesta        Persistent lymphocytosis    Very mild,  Since January.  Recheck in 3 months;  Will refer to hematology if still present at that time       Other Visit Diagnoses     Fatigue, unspecified type       Relevant Orders   CBC with Differential/Platelet (Completed)   TSH (Completed)   Comprehensive metabolic panel (Completed)       40   minutes  Was spent  reviewing patient's current problems and recent encounters with her specialists, reviewing and ordering  labs and imaging studies, providing counseling on the above mentioned problems , and evaluating patient  In a face to face visit  .   Follow-up: No follow-ups on file.   [name], MD

## 2021-12-09 ENCOUNTER — Other Ambulatory Visit: Payer: Self-pay | Admitting: Internal Medicine

## 2021-12-09 DIAGNOSIS — D72820 Lymphocytosis (symptomatic): Secondary | ICD-10-CM

## 2021-12-09 NOTE — Assessment & Plan Note (Signed)
Incidentaloma.  Annual follow up by Dr [name]

## 2021-12-09 NOTE — Assessment & Plan Note (Signed)
continue propranolol.  No longer abusing alcohol

## 2021-12-09 NOTE — Assessment & Plan Note (Addendum)
She remains asymptomatic , taking asa and plavix indefinitely  .  Reminded to continue  regular surveillance with [facility] Anon or  Dr Lucky Cowboy

## 2021-12-09 NOTE — Assessment & Plan Note (Signed)
Improved ,  eccept for insomnia. Gaining weight,  Quitting smoking.  Increase mirtazipine to 30 mg. Discussed the alternative if  Higher dose is  not tolerated:  Resume  15 mg ,  Add trazodone or lunesta

## 2021-12-09 NOTE — Assessment & Plan Note (Signed)
hgb remains normal.   Lab Results  Component Value Date   WBC 12.9 (H) 12/07/2021   HGB 14.7 12/07/2021   HCT 44.6 12/07/2021   MCV 99.0 12/07/2021   PLT 238.0 12/07/2021

## 2021-12-09 NOTE — Assessment & Plan Note (Signed)
Very mild,  Since January.  Recheck in 3 months;  Will refer to hematology if still present at that time

## 2021-12-09 NOTE — Assessment & Plan Note (Signed)
Managed with high potency sttin (Crestor 40 mg )   LDL is at goal  Lab Results  Component Value Date   CHOL 123 12/07/2021   HDL 46.30 12/07/2021   LDLCALC 63 12/07/2021   TRIG 70.0 12/07/2021   CHOLHDL 3 12/07/2021

## 2021-12-12 DIAGNOSIS — M9904 Segmental and somatic dysfunction of sacral region: Secondary | ICD-10-CM | POA: Diagnosis not present

## 2021-12-12 DIAGNOSIS — M9903 Segmental and somatic dysfunction of lumbar region: Secondary | ICD-10-CM | POA: Diagnosis not present

## 2021-12-12 DIAGNOSIS — M9902 Segmental and somatic dysfunction of thoracic region: Secondary | ICD-10-CM | POA: Diagnosis not present

## 2021-12-12 DIAGNOSIS — S335XXA Sprain of ligaments of lumbar spine, initial encounter: Secondary | ICD-10-CM | POA: Diagnosis not present

## 2021-12-12 DIAGNOSIS — M546 Pain in thoracic spine: Secondary | ICD-10-CM | POA: Diagnosis not present

## 2021-12-12 DIAGNOSIS — S336XXA Sprain of sacroiliac joint, initial encounter: Secondary | ICD-10-CM | POA: Diagnosis not present

## 2021-12-13 DIAGNOSIS — M546 Pain in thoracic spine: Secondary | ICD-10-CM | POA: Diagnosis not present

## 2021-12-13 DIAGNOSIS — M9902 Segmental and somatic dysfunction of thoracic region: Secondary | ICD-10-CM | POA: Diagnosis not present

## 2021-12-13 DIAGNOSIS — M9904 Segmental and somatic dysfunction of sacral region: Secondary | ICD-10-CM | POA: Diagnosis not present

## 2021-12-13 DIAGNOSIS — M9903 Segmental and somatic dysfunction of lumbar region: Secondary | ICD-10-CM | POA: Diagnosis not present

## 2021-12-13 DIAGNOSIS — S336XXA Sprain of sacroiliac joint, initial encounter: Secondary | ICD-10-CM | POA: Diagnosis not present

## 2021-12-13 DIAGNOSIS — S335XXA Sprain of ligaments of lumbar spine, initial encounter: Secondary | ICD-10-CM | POA: Diagnosis not present

## 2021-12-14 DIAGNOSIS — M546 Pain in thoracic spine: Secondary | ICD-10-CM | POA: Diagnosis not present

## 2021-12-14 DIAGNOSIS — M9902 Segmental and somatic dysfunction of thoracic region: Secondary | ICD-10-CM | POA: Diagnosis not present

## 2021-12-14 DIAGNOSIS — M9903 Segmental and somatic dysfunction of lumbar region: Secondary | ICD-10-CM | POA: Diagnosis not present

## 2021-12-14 DIAGNOSIS — M9904 Segmental and somatic dysfunction of sacral region: Secondary | ICD-10-CM | POA: Diagnosis not present

## 2021-12-14 DIAGNOSIS — S336XXA Sprain of sacroiliac joint, initial encounter: Secondary | ICD-10-CM | POA: Diagnosis not present

## 2021-12-14 DIAGNOSIS — S335XXA Sprain of ligaments of lumbar spine, initial encounter: Secondary | ICD-10-CM | POA: Diagnosis not present

## 2021-12-19 DIAGNOSIS — S335XXA Sprain of ligaments of lumbar spine, initial encounter: Secondary | ICD-10-CM | POA: Diagnosis not present

## 2021-12-19 DIAGNOSIS — M546 Pain in thoracic spine: Secondary | ICD-10-CM | POA: Diagnosis not present

## 2021-12-19 DIAGNOSIS — M9903 Segmental and somatic dysfunction of lumbar region: Secondary | ICD-10-CM | POA: Diagnosis not present

## 2021-12-19 DIAGNOSIS — M9904 Segmental and somatic dysfunction of sacral region: Secondary | ICD-10-CM | POA: Diagnosis not present

## 2021-12-19 DIAGNOSIS — S336XXA Sprain of sacroiliac joint, initial encounter: Secondary | ICD-10-CM | POA: Diagnosis not present

## 2021-12-19 DIAGNOSIS — M9902 Segmental and somatic dysfunction of thoracic region: Secondary | ICD-10-CM | POA: Diagnosis not present

## 2021-12-20 DIAGNOSIS — M9903 Segmental and somatic dysfunction of lumbar region: Secondary | ICD-10-CM | POA: Diagnosis not present

## 2021-12-20 DIAGNOSIS — M9904 Segmental and somatic dysfunction of sacral region: Secondary | ICD-10-CM | POA: Diagnosis not present

## 2021-12-20 DIAGNOSIS — M546 Pain in thoracic spine: Secondary | ICD-10-CM | POA: Diagnosis not present

## 2021-12-20 DIAGNOSIS — S335XXA Sprain of ligaments of lumbar spine, initial encounter: Secondary | ICD-10-CM | POA: Diagnosis not present

## 2021-12-20 DIAGNOSIS — S336XXA Sprain of sacroiliac joint, initial encounter: Secondary | ICD-10-CM | POA: Diagnosis not present

## 2021-12-20 DIAGNOSIS — M9902 Segmental and somatic dysfunction of thoracic region: Secondary | ICD-10-CM | POA: Diagnosis not present

## 2021-12-21 ENCOUNTER — Other Ambulatory Visit: Payer: Self-pay | Admitting: Internal Medicine

## 2021-12-21 ENCOUNTER — Other Ambulatory Visit: Payer: Self-pay | Admitting: Cardiovascular Disease

## 2021-12-21 DIAGNOSIS — M9903 Segmental and somatic dysfunction of lumbar region: Secondary | ICD-10-CM | POA: Diagnosis not present

## 2021-12-21 DIAGNOSIS — S335XXA Sprain of ligaments of lumbar spine, initial encounter: Secondary | ICD-10-CM | POA: Diagnosis not present

## 2021-12-21 DIAGNOSIS — M9904 Segmental and somatic dysfunction of sacral region: Secondary | ICD-10-CM | POA: Diagnosis not present

## 2021-12-21 DIAGNOSIS — M9902 Segmental and somatic dysfunction of thoracic region: Secondary | ICD-10-CM | POA: Diagnosis not present

## 2021-12-21 DIAGNOSIS — M546 Pain in thoracic spine: Secondary | ICD-10-CM | POA: Diagnosis not present

## 2021-12-21 DIAGNOSIS — S336XXA Sprain of sacroiliac joint, initial encounter: Secondary | ICD-10-CM | POA: Diagnosis not present

## 2021-12-26 DIAGNOSIS — S336XXA Sprain of sacroiliac joint, initial encounter: Secondary | ICD-10-CM | POA: Diagnosis not present

## 2021-12-26 DIAGNOSIS — S335XXA Sprain of ligaments of lumbar spine, initial encounter: Secondary | ICD-10-CM | POA: Diagnosis not present

## 2021-12-26 DIAGNOSIS — M9904 Segmental and somatic dysfunction of sacral region: Secondary | ICD-10-CM | POA: Diagnosis not present

## 2021-12-26 DIAGNOSIS — M9903 Segmental and somatic dysfunction of lumbar region: Secondary | ICD-10-CM | POA: Diagnosis not present

## 2021-12-26 DIAGNOSIS — M546 Pain in thoracic spine: Secondary | ICD-10-CM | POA: Diagnosis not present

## 2021-12-26 DIAGNOSIS — M9902 Segmental and somatic dysfunction of thoracic region: Secondary | ICD-10-CM | POA: Diagnosis not present

## 2021-12-27 DIAGNOSIS — S336XXA Sprain of sacroiliac joint, initial encounter: Secondary | ICD-10-CM | POA: Diagnosis not present

## 2021-12-27 DIAGNOSIS — M9904 Segmental and somatic dysfunction of sacral region: Secondary | ICD-10-CM | POA: Diagnosis not present

## 2021-12-27 DIAGNOSIS — M9903 Segmental and somatic dysfunction of lumbar region: Secondary | ICD-10-CM | POA: Diagnosis not present

## 2021-12-27 DIAGNOSIS — M9902 Segmental and somatic dysfunction of thoracic region: Secondary | ICD-10-CM | POA: Diagnosis not present

## 2021-12-27 DIAGNOSIS — M546 Pain in thoracic spine: Secondary | ICD-10-CM | POA: Diagnosis not present

## 2021-12-27 DIAGNOSIS — S335XXA Sprain of ligaments of lumbar spine, initial encounter: Secondary | ICD-10-CM | POA: Diagnosis not present

## 2021-12-28 ENCOUNTER — Encounter: Payer: Self-pay | Admitting: Internal Medicine

## 2021-12-28 DIAGNOSIS — S335XXA Sprain of ligaments of lumbar spine, initial encounter: Secondary | ICD-10-CM | POA: Diagnosis not present

## 2021-12-28 DIAGNOSIS — S336XXA Sprain of sacroiliac joint, initial encounter: Secondary | ICD-10-CM | POA: Diagnosis not present

## 2021-12-28 DIAGNOSIS — M546 Pain in thoracic spine: Secondary | ICD-10-CM | POA: Diagnosis not present

## 2021-12-28 DIAGNOSIS — M9902 Segmental and somatic dysfunction of thoracic region: Secondary | ICD-10-CM | POA: Diagnosis not present

## 2021-12-28 DIAGNOSIS — M9904 Segmental and somatic dysfunction of sacral region: Secondary | ICD-10-CM | POA: Diagnosis not present

## 2021-12-28 DIAGNOSIS — M9903 Segmental and somatic dysfunction of lumbar region: Secondary | ICD-10-CM | POA: Diagnosis not present

## 2021-12-28 NOTE — Telephone Encounter (Signed)
Called pt and scheduled her for 01/10/2022 at 11:30. Pt gave a verbal understanding.

## 2022-01-02 DIAGNOSIS — S335XXA Sprain of ligaments of lumbar spine, initial encounter: Secondary | ICD-10-CM | POA: Diagnosis not present

## 2022-01-02 DIAGNOSIS — M9903 Segmental and somatic dysfunction of lumbar region: Secondary | ICD-10-CM | POA: Diagnosis not present

## 2022-01-02 DIAGNOSIS — M9904 Segmental and somatic dysfunction of sacral region: Secondary | ICD-10-CM | POA: Diagnosis not present

## 2022-01-02 DIAGNOSIS — S336XXA Sprain of sacroiliac joint, initial encounter: Secondary | ICD-10-CM | POA: Diagnosis not present

## 2022-01-02 DIAGNOSIS — M9902 Segmental and somatic dysfunction of thoracic region: Secondary | ICD-10-CM | POA: Diagnosis not present

## 2022-01-02 DIAGNOSIS — M546 Pain in thoracic spine: Secondary | ICD-10-CM | POA: Diagnosis not present

## 2022-01-04 DIAGNOSIS — M9902 Segmental and somatic dysfunction of thoracic region: Secondary | ICD-10-CM | POA: Diagnosis not present

## 2022-01-04 DIAGNOSIS — S336XXA Sprain of sacroiliac joint, initial encounter: Secondary | ICD-10-CM | POA: Diagnosis not present

## 2022-01-04 DIAGNOSIS — M9903 Segmental and somatic dysfunction of lumbar region: Secondary | ICD-10-CM | POA: Diagnosis not present

## 2022-01-04 DIAGNOSIS — M9904 Segmental and somatic dysfunction of sacral region: Secondary | ICD-10-CM | POA: Diagnosis not present

## 2022-01-04 DIAGNOSIS — S335XXA Sprain of ligaments of lumbar spine, initial encounter: Secondary | ICD-10-CM | POA: Diagnosis not present

## 2022-01-04 DIAGNOSIS — M546 Pain in thoracic spine: Secondary | ICD-10-CM | POA: Diagnosis not present

## 2022-01-10 ENCOUNTER — Encounter: Payer: Self-pay | Admitting: Internal Medicine

## 2022-01-10 ENCOUNTER — Ambulatory Visit: Payer: Medicare PPO | Admitting: Internal Medicine

## 2022-01-10 ENCOUNTER — Other Ambulatory Visit: Payer: Self-pay

## 2022-01-10 DIAGNOSIS — M9904 Segmental and somatic dysfunction of sacral region: Secondary | ICD-10-CM | POA: Diagnosis not present

## 2022-01-10 DIAGNOSIS — M546 Pain in thoracic spine: Secondary | ICD-10-CM | POA: Diagnosis not present

## 2022-01-10 DIAGNOSIS — S335XXA Sprain of ligaments of lumbar spine, initial encounter: Secondary | ICD-10-CM | POA: Diagnosis not present

## 2022-01-10 DIAGNOSIS — M9903 Segmental and somatic dysfunction of lumbar region: Secondary | ICD-10-CM | POA: Diagnosis not present

## 2022-01-10 DIAGNOSIS — M9902 Segmental and somatic dysfunction of thoracic region: Secondary | ICD-10-CM | POA: Diagnosis not present

## 2022-01-10 DIAGNOSIS — S336XXA Sprain of sacroiliac joint, initial encounter: Secondary | ICD-10-CM | POA: Diagnosis not present

## 2022-01-10 DIAGNOSIS — F339 Major depressive disorder, recurrent, unspecified: Secondary | ICD-10-CM

## 2022-01-10 DIAGNOSIS — G4700 Insomnia, unspecified: Secondary | ICD-10-CM

## 2022-01-10 NOTE — Patient Instructions (Addendum)
I do NOT  RECOMMEND the use of a benzodiazepine for your insomnia because they act like alcohol to the brain (xanax,  ativan,  restoril)    Try melatonin at 7-8 pm.  Max dose is 10 mg  Go for a walk every day before dinner.   No electronics within 2 hours of bedtime  No TV in bed.  Only books. NO PAGETURNERS   Try the meditation techniques of a phone app called "headspace."  Please consider Ramelteon,  Belsomra, or Doxepin

## 2022-01-10 NOTE — Progress Notes (Signed)
Subjective:  Patient ID: Tracie Morris, female    DOB: 1955-12-04  Age: 66 y.o. MRN: [id number]  CC: Diagnoses of Episode of recurrent major depressive disorder, unspecified depression episode severity ([place]), Hereditary hemochromatosis ([place]), and Insomnia, persistent were pertinent to this visit.   This visit occurred during the SARS-CoV-2 public health emergency.  Safety protocols were in place, including screening questions prior to the visit, additional usage of staff PPE, and extensive cleaning of exam room while observing appropriate contact time as indicated for disinfecting solutions.    HPI Tracie Morris presents for  Chief Complaint  Patient presents with   Follow-up    Insomnia   1) insomnia trial of Remeron ineffective both at 15 and 30 mg dose.  No appetite improvement either.  Refuses to take Lunesta or trazodone due to concern about  side effects after reading about 1) addiction potential to lunesta and 2) trazodone being cited as causing an adrenal mass .  Wants to :resume alprazolam ,  which I have informed her I will not do given her history of alcoholism   Cannot fall asleep. "Can't get my brain to slow down"  Sleep habits change depending on day's activity. Goes early (10 pm) if the day has been active.  Often watches TV in bed. Does not look on face book on a regular basis. Reads books, not electronic   Mirtaziine helped initially with appetite and depression but then plateaued   2) leukocytosis  chronic and unchanged  per Dr [name].  No further workup   3)    Outpatient Medications Prior to Visit  Medication Sig Dispense Refill   aspirin EC 81 MG tablet Take 1 tablet (81 mg total) by mouth daily. Swallow whole.     clopidogrel (PLAVIX) 75 MG tablet TAKE 1 TABLET BY MOUTH ONCE DAILY 90 tablet 0   Colostrum 500 MG CAPS Take 500 mg by mouth daily.      Glycerin-Hypromellose-PEG 400 (DRY EYE RELIEF DROPS) 0.2-0.2-1 % SOLN Place 1 drop into both eyes daily  as needed (Dry eye).     levothyroxine (SYNTHROID) 50 MCG tablet TAKE 1 TABLET BY MOUTH ONCE DAILY ON AN EMPTY STOMACH. WAIT 30 MINUTES BEFORE TAKING OTHER MEDS. 90 tablet 1   mirtazapine (REMERON) 15 MG tablet Take 1 tablet (15 mg total) by mouth at bedtime. 90 tablet 1   Multiple Vitamin (MULTIVITAMIN WITH MINERALS) TABS tablet Take 1 tablet by mouth daily.     propranolol (INDERAL) 10 MG tablet TAKE 1 TABLET BY MOUTH 3 TIMES DAILY AS NEEDED FOR TREMOR 90 tablet 3   rosuvastatin (CRESTOR) 40 MG tablet Take 1 tablet (40 mg total) by mouth daily. 90 tablet 0   varenicline (CHANTIX CONTINUING MONTH PAK) 1 MG tablet Take 1 tablet (1 mg total) by mouth 2 (two) times daily. 60 tablet 2   varenicline (CHANTIX STARTING MONTH PAK) 0.5 MG X 11 & 1 MG X 42 tablet Take one 0.5 mg tablet by mouth once daily for 3 days, then increase to one 0.5 mg tablet twice daily for 4 days, then increase to one 1 mg tablet twice daily. 53 tablet 0   No facility-administered medications prior to visit.    Review of Systems;  Patient denies headache, fevers, malaise, unintentional weight loss, skin rash, eye pain, sinus congestion and sinus pain, sore throat, dysphagia,  hemoptysis , cough, dyspnea, wheezing, chest pain, palpitations, orthopnea, edema, abdominal pain, nausea, melena, diarrhea, constipation, flank pain, dysuria, hematuria, urinary  Frequency, nocturia, numbness, tingling, seizures,  Focal weakness, Loss of consciousness,  Tremor, insomnia, depression, anxiety, and suicidal ideation.      Objective:  BP 122/64 (BP Location: Left Arm, Patient Position: Sitting, Cuff Size: Normal)    Pulse 81    Temp 98.4 F (36.9 C) (Oral)    Ht 5' 7.5" (1.715 m)    Wt 127 lb 3.2 oz (57.7 kg)    SpO2 98%    BMI 19.63 kg/m   BP Readings from Last 3 Encounters:  01/10/22 122/64  12/07/21 124/64  09/07/21 112/66    Wt Readings from Last 3 Encounters:  01/10/22 127 lb 3.2 oz (57.7 kg)  12/07/21 125 lb 9.6 oz (57 kg)   09/07/21 120 lb 12.8 oz (54.8 kg)    General appearance: alert, cooperative and appears stated age Ears: normal TM's and external ear canals both ears Throat: lips, mucosa, and tongue normal; teeth and gums normal Neck: no adenopathy, no carotid bruit, supple, symmetrical, trachea midline and thyroid not enlarged, symmetric, no tenderness/mass/nodules Back: symmetric, no curvature. ROM normal. No CVA tenderness. Lungs: clear to auscultation bilaterally Heart: regular rate and rhythm, S1, S2 normal, no murmur, click, rub or gallop Abdomen: soft, non-tender; bowel sounds normal; no masses,  no organomegaly Pulses: 2+ and symmetric Skin: Skin color, texture, turgor normal. No rashes or lesions Lymph nodes: Cervical, supraclavicular, and axillary nodes normal.  Lab Results  Component Value Date   HGBA1C 4.2 (L) 12/22/2018    Lab Results  Component Value Date   CREATININE 0.81 12/07/2021   CREATININE 0.82 08/03/2021   CREATININE 0.79 12/22/2020    Lab Results  Component Value Date   WBC 12.9 (H) 12/07/2021   HGB 14.7 12/07/2021   HCT 44.6 12/07/2021   PLT 238.0 12/07/2021   GLUCOSE 89 12/07/2021   CHOL 123 12/07/2021   TRIG 70.0 12/07/2021   HDL 46.30 12/07/2021   LDLCALC 63 12/07/2021   ALT 12 12/07/2021   AST 20 12/07/2021   NA 139 12/07/2021   K 3.9 12/07/2021   CL 105 12/07/2021   CREATININE 0.81 12/07/2021   BUN 13 12/07/2021   CO2 29 12/07/2021   TSH 4.20 12/07/2021   INR 1.0 11/05/2016   HGBA1C 4.2 (L) 12/22/2018   MICROALBUR 0.6 01/11/2014    MM 3D SCREEN BREAST BILATERAL  Result Date: 09/15/2021 CLINICAL DATA:  Screening. EXAM: DIGITAL SCREENING BILATERAL MAMMOGRAM WITH TOMOSYNTHESIS AND CAD TECHNIQUE: Bilateral screening digital craniocaudal and mediolateral oblique mammograms were obtained. Bilateral screening digital breast tomosynthesis was performed. The images were evaluated with computer-aided detection. COMPARISON:  Previous exam(s). ACR Breast  Density Category c: The breast tissue is heterogeneously dense, which may obscure small masses. FINDINGS: There are no findings suspicious for malignancy. IMPRESSION: No mammographic evidence of malignancy. A result letter of this screening mammogram will be mailed directly to the patient. RECOMMENDATION: Screening mammogram in one year. (Code:SM-B-01Y) BI-RADS CATEGORY  1: Negative. Electronically Signed   By: Lajean Manes M.D.   On: 09/15/2021 15:59   Assessment & Plan:   Problem List Items Addressed This Visit     Episode of recurrent major depressive disorder, unspecified depression episode severity ([place])    She is feeling better , primarily because she has come to terms with Dwight's condition and realizes that worrying about him will not change anything. Continue mirtazapine 15 mg daily       Hereditary hemochromatosis ([place])    Confirmed with testing.  hgb has remained stable with  smoking cessation Continue semi annual surveillance       Insomnia, persistent    Her insomnia is chronic and aggravated by anxiety  triggered by the serious health conditions of her husband ,  Bedtime habits reviewed. .did not see improvement with mirtazapine.  Apprehensive about S/E of Lunesta and trazodone.  Recommending melatonin and  nonpharmacologic treatment given need to avoid addictive medications.         I spent 30 minutes dedicated to the care of this patient on the date of this encounter to include pre-visit review of patient's medical history,  most recent imaging studies, Face-to-face time with the patient , and post visit ordering of testing and therapeutics.    Follow-up: No follow-ups on file.   [name], MD

## 2022-01-10 NOTE — Assessment & Plan Note (Signed)
Her insomnia is chronic and aggravated by anxiety  triggered by the serious health conditions of her husband ,  Bedtime habits reviewed. .did not see improvement with mirtazapine.  Apprehensive about S/E of Lunesta and trazodone.  Recommending melatonin and  nonpharmacologic treatment given need to avoid addictive medications.

## 2022-01-10 NOTE — Assessment & Plan Note (Signed)
Confirmed with testing.  hgb has remained stable with smoking cessation Continue semi annual surveillance

## 2022-01-10 NOTE — Assessment & Plan Note (Signed)
She is feeling better , primarily because she has come to terms with Dwight's condition and realizes that worrying about him will not change anything. Continue mirtazapine 15 mg daily

## 2022-01-18 DIAGNOSIS — S336XXA Sprain of sacroiliac joint, initial encounter: Secondary | ICD-10-CM | POA: Diagnosis not present

## 2022-01-18 DIAGNOSIS — M9902 Segmental and somatic dysfunction of thoracic region: Secondary | ICD-10-CM | POA: Diagnosis not present

## 2022-01-18 DIAGNOSIS — M546 Pain in thoracic spine: Secondary | ICD-10-CM | POA: Diagnosis not present

## 2022-01-18 DIAGNOSIS — S335XXA Sprain of ligaments of lumbar spine, initial encounter: Secondary | ICD-10-CM | POA: Diagnosis not present

## 2022-01-18 DIAGNOSIS — M9904 Segmental and somatic dysfunction of sacral region: Secondary | ICD-10-CM | POA: Diagnosis not present

## 2022-01-18 DIAGNOSIS — M9903 Segmental and somatic dysfunction of lumbar region: Secondary | ICD-10-CM | POA: Diagnosis not present

## 2022-01-24 DIAGNOSIS — S336XXA Sprain of sacroiliac joint, initial encounter: Secondary | ICD-10-CM | POA: Diagnosis not present

## 2022-01-24 DIAGNOSIS — M9902 Segmental and somatic dysfunction of thoracic region: Secondary | ICD-10-CM | POA: Diagnosis not present

## 2022-01-24 DIAGNOSIS — M9904 Segmental and somatic dysfunction of sacral region: Secondary | ICD-10-CM | POA: Diagnosis not present

## 2022-01-24 DIAGNOSIS — M546 Pain in thoracic spine: Secondary | ICD-10-CM | POA: Diagnosis not present

## 2022-01-24 DIAGNOSIS — M9903 Segmental and somatic dysfunction of lumbar region: Secondary | ICD-10-CM | POA: Diagnosis not present

## 2022-01-24 DIAGNOSIS — S335XXA Sprain of ligaments of lumbar spine, initial encounter: Secondary | ICD-10-CM | POA: Diagnosis not present

## 2022-01-29 ENCOUNTER — Ambulatory Visit (INDEPENDENT_AMBULATORY_CARE_PROVIDER_SITE_OTHER): Payer: Self-pay | Admitting: Dermatology

## 2022-01-29 ENCOUNTER — Other Ambulatory Visit: Payer: Self-pay

## 2022-01-29 DIAGNOSIS — L988 Other specified disorders of the skin and subcutaneous tissue: Secondary | ICD-10-CM

## 2022-01-29 MED ORDER — VALACYCLOVIR HCL 500 MG PO TABS: 500.0000 mg | ORAL_TABLET | Freq: Two times a day (BID) | ORAL | 0 refills | Status: AC

## 2022-01-29 NOTE — Progress Notes (Signed)
? ?  Follow-Up Visit ?  ?Subjective  ?Tracie Morris is a 66 y.o. female who presents for the following: Facial Elastosis. ? ?Patient presents for Restylane injections of the nasolabial and oral commissure. She would also like lips injected today, if possible.   ? ?The following portions of the chart were reviewed this encounter and updated as appropriate:  ?  ?  ? ?Review of Systems:  No other skin or systemic complaints except as noted in HPI or Assessment and Plan. ? ?Objective  ?Well appearing patient in no apparent distress; mood and affect are within normal limits. ? ?A focused examination was performed including face. Relevant physical exam findings are noted in the Assessment and Plan. ? ?Nasolabial, oral commissure, lips ?Rhytides and volume loss.  ? ? ? ? ? ? ? ? ? ? ? ? ? ? ? ? ? ? ? ? ? ? ? ? ? ? ?Assessment & Plan  ?Elastosis of skin ?Nasolabial, oral commissure, lips ? ?Restylane Defyne 74m injected to the bil oral commissures and chin as marked. Lot [id number] 12/26/2022 ? ?Restylane Refyne 1 mL injections today to the lips, lower nasolabial and perioral area including upper cutaneous lip as marked. Exp 02/24/2023 ? ?Start Valtrex '500mg'$  take 1 po BID x 7 days. ? ? ?Filling material injection - Nasolabial, oral commissure, lips ?Prior to the procedure, the patient's past medical history, allergies and the rare but potential risks and complications were reviewed with the patient and a signed consent was obtained. Pre and post-treatment care was discussed and instructions provided. ? ?Location: lips, nasolabial folds, and marionette lines ? ?Filler Type: Restylane refyne and Restylane defyne ? ?Procedure: The area was prepped thoroughly with Puracyn. Lidocaine-tetracaine ointment was applied to treatment areas to achieve good local anesthesia. After introducing the needle into the desired treatment area, the syringe plunger was drawn back to ensure there was no flash of blood prior to injecting the  filler in order to minimize risk of intravascular injection and vascular occlusion. After injection of the filler, the treated areas were cleansed and iced to reduce swelling. Post-treatment instructions were reviewed with the patient.      ? ?Patient tolerated the procedure well. The patient will call with any problems, questions or concerns prior to their next appointment. ? ? ?valACYclovir (VALTREX) 500 MG tablet - Nasolabial, oral commissure, lips ?Take 1 tablet (500 mg total) by mouth 2 (two) times daily for 7 days. ? ? ?Return as scheduled. ? ?I, MJamesetta Orleans CMA, am acting as scribe for TBrendolyn Patty MD . ? ?Documentation: I have reviewed the above documentation for accuracy and completeness, and I agree with the above. ? ?TBrendolyn PattyMD  ? ? ?

## 2022-01-29 NOTE — Patient Instructions (Signed)

## 2022-02-02 ENCOUNTER — Other Ambulatory Visit: Payer: Self-pay | Admitting: Cardiovascular Disease

## 2022-02-02 DIAGNOSIS — I739 Peripheral vascular disease, unspecified: Secondary | ICD-10-CM

## 2022-02-07 DIAGNOSIS — M9904 Segmental and somatic dysfunction of sacral region: Secondary | ICD-10-CM | POA: Diagnosis not present

## 2022-02-07 DIAGNOSIS — M546 Pain in thoracic spine: Secondary | ICD-10-CM | POA: Diagnosis not present

## 2022-02-07 DIAGNOSIS — M9902 Segmental and somatic dysfunction of thoracic region: Secondary | ICD-10-CM | POA: Diagnosis not present

## 2022-02-07 DIAGNOSIS — M9903 Segmental and somatic dysfunction of lumbar region: Secondary | ICD-10-CM | POA: Diagnosis not present

## 2022-02-07 DIAGNOSIS — S335XXA Sprain of ligaments of lumbar spine, initial encounter: Secondary | ICD-10-CM | POA: Diagnosis not present

## 2022-02-07 DIAGNOSIS — S336XXA Sprain of sacroiliac joint, initial encounter: Secondary | ICD-10-CM | POA: Diagnosis not present

## 2022-02-08 ENCOUNTER — Other Ambulatory Visit: Payer: Self-pay

## 2022-02-08 ENCOUNTER — Ambulatory Visit (INDEPENDENT_AMBULATORY_CARE_PROVIDER_SITE_OTHER): Payer: Medicare PPO

## 2022-02-08 DIAGNOSIS — Z95828 Presence of other vascular implants and grafts: Secondary | ICD-10-CM | POA: Diagnosis not present

## 2022-02-08 DIAGNOSIS — I739 Peripheral vascular disease, unspecified: Secondary | ICD-10-CM

## 2022-02-09 ENCOUNTER — Telehealth: Payer: Self-pay | Admitting: Internal Medicine

## 2022-02-09 ENCOUNTER — Telehealth: Payer: Self-pay

## 2022-02-09 DIAGNOSIS — I739 Peripheral vascular disease, unspecified: Secondary | ICD-10-CM

## 2022-02-09 NOTE — Telephone Encounter (Signed)
-----   Message from Wellington Hampshire, MD sent at 02/09/2022 11:20 AM EDT ----- ?Normal ABI with patent left iliac and SFA stents.  Repeat studies in 1 year. ? ?

## 2022-02-09 NOTE — Telephone Encounter (Signed)
Patient made aware of test results with verbalized understanding. Orders placed for 1 yr repeat PV testing. ?

## 2022-02-09 NOTE — Telephone Encounter (Signed)
Timmothy Euler Key: Sol Passer - PA Case ID: [id number] Need help? Call us at [phone] ?Status ?Sent to [employer] ?Drug ?Prolia '60MG'$ /ML syringes ?

## 2022-02-12 NOTE — Telephone Encounter (Signed)
Prolia has been approved from 02/24/2022 to 11/25/2022.  ? ?Approved EOC Number: [id number] ?

## 2022-02-13 ENCOUNTER — Other Ambulatory Visit: Payer: Self-pay

## 2022-02-13 ENCOUNTER — Encounter: Payer: Self-pay | Admitting: Cardiovascular Disease

## 2022-02-13 ENCOUNTER — Ambulatory Visit: Payer: Medicare PPO | Admitting: Cardiovascular Disease

## 2022-02-13 VITALS — BP 110/70 | HR 68 | Ht 67.5 in | Wt 125.2 lb

## 2022-02-13 DIAGNOSIS — I2584 Coronary atherosclerosis due to calcified coronary lesion: Secondary | ICD-10-CM

## 2022-02-13 DIAGNOSIS — Z720 Tobacco use: Secondary | ICD-10-CM

## 2022-02-13 DIAGNOSIS — I739 Peripheral vascular disease, unspecified: Secondary | ICD-10-CM

## 2022-02-13 DIAGNOSIS — E785 Hyperlipidemia, unspecified: Secondary | ICD-10-CM | POA: Diagnosis not present

## 2022-02-13 DIAGNOSIS — I2510 Atherosclerotic heart disease of native coronary artery without angina pectoris: Secondary | ICD-10-CM | POA: Diagnosis not present

## 2022-02-13 DIAGNOSIS — I779 Disorder of arteries and arterioles, unspecified: Secondary | ICD-10-CM | POA: Diagnosis not present

## 2022-02-13 NOTE — Patient Instructions (Signed)

## 2022-02-13 NOTE — Progress Notes (Signed)
Cardiology Office Note   Date:  02/13/2022   ID:  Tracie Morris, Alferd Apa 03/30/1956, MRN [id number]  PCP:  [name], MD  Cardiologist:   [name], MD   Chief Complaint  Patient presents with   Other    F/u testing no complaints today. Meds reviewed verbally with pt.      History of Present Illness: Shima Compere is a 66 y.o. female who presents for a followup visit regarding peripheral arterial disease.  She is status post left common iliac artery stent placement in November of 2014 for significant claudication with subsequent covered stent placement in 2017 for in situ thrombosis with subtotal occlusion and recurrent stent thrombosis in the setting of interruption of clopidogrel.  Most recently in November 2021 which required thrombectomy and stent placement.  She has known history of hereditary hemochromatosis which likely contributes to hypercoagulable state. Other Medical history include  tobacco use and hyperlipidemia   Previous CT scan of the chest did show calcifications in the coronary arteries. 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  She has history of intermittent tobacco use but quit again recently with Chantix.  She has been doing reasonably well with no chest pain, shortness of breath or lower extremity claudication.   Past Medical History:  Diagnosis Date   Arthritis    Basal cell carcinoma 03/29/2014   L lower abdomen   Basal cell carcinoma 01/18/2020   central nasal tip, [facility]   Breast mass 09/05/2020   right breast mass 10 oclock   Carotid artery occlusion    Hemochromatosis    Hyperlipidemia    Hypocalcemia    hypothyroidism 2007   acquired, secondary to surgery for thyroid Ca   Hypothyroidism    Migraine    improved since thyroid surgery, 2 years   multinodular goiter 2007   s/p thyroidectomy   Peripheral arterial disease ([place])    Left common iliac artery occlusion status post balloon  angioplasty without stent placement in January of 2014. Repeat angiography in November of 2014 showed 50% distal aortic stenosis extending into the left common iliac artery which had 60-70% ostial stenosis. No other significant disease. 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   R nasal tip/in situ, txt'd  with [facility]     Past Surgical History:  Procedure Laterality Date   ABDOMINAL AORTAGRAM N/A 10/14/2013   Procedure: ABDOMINAL [name];  Surgeon: Wellington Hampshire, MD;  Location: [place] CATH LAB;  Service: Cardiovascular;  Laterality: N/A;   ABDOMINAL AORTOGRAM W/LOWER EXTREMITY N/A 10/05/2020   Procedure: ABDOMINAL AORTOGRAM W/LOWER EXTREMITY;  Surgeon: Wellington Hampshire, MD;  Location: [place] CV LAB;  Service: Cardiovascular;  Laterality: N/A;   ANGIOPLASTY ILLIAC ARTERY     CESAREAN SECTION     COLONOSCOPY WITH PROPOFOL N/A 09/22/2020   Procedure: COLONOSCOPY WITH PROPOFOL;  Surgeon: Lin Landsman, MD;  Location: [place];  Service: Endoscopy;  Laterality: N/A;   NOSE SURGERY     PERIPHERAL VASCULAR CATHETERIZATION N/A 11/14/2016   Procedure: Abdominal Aortogram;  Surgeon: Wellington Hampshire, MD;  Location: [place] CV LAB;  Service: Cardiovascular;  Laterality: N/A;   PERIPHERAL VASCULAR CATHETERIZATION Bilateral 11/14/2016   Procedure: Lower Extremity Angiography;  Surgeon: Wellington Hampshire, MD;  Location: [place] CV LAB;  Service: Cardiovascular;  Laterality: Bilateral;  limited iliacs only   PERIPHERAL VASCULAR CATHETERIZATION Left 11/14/2016   Procedure: Peripheral Vascular Intervention;  Surgeon: Wellington Hampshire, MD;  Location: [place] CV LAB;  Service: Cardiovascular;  Laterality: Left;  common iliac   PERIPHERAL VASCULAR INTERVENTION  10/05/2020   Procedure: PERIPHERAL VASCULAR INTERVENTION;   Surgeon: Wellington Hampshire, MD;  Location: [place] CV LAB;  Service: Cardiovascular;;  left common iliac    PERIPHERAL VASCULAR THROMBECTOMY  10/05/2020   Procedure: PERIPHERAL VASCULAR THROMBECTOMY;  Surgeon: Wellington Hampshire, MD;  Location: [place] CV LAB;  Service: Cardiovascular;;  Mechanical - Left common iliac, left internal iliac, and left SFA   POLYPECTOMY  09/22/2020   Procedure: POLYPECTOMY;  Surgeon: Lin Landsman, MD;  Location: [place];  Service: Endoscopy;;   SKIN CANCER EXCISION     THYROID SURGERY  2007   secondary to thyroid cancer     Current Outpatient Medications  Medication Sig Dispense Refill   aspirin EC 81 MG tablet Take 1 tablet (81 mg total) by mouth daily. Swallow whole.     clopidogrel (PLAVIX) 75 MG tablet TAKE 1 TABLET BY MOUTH ONCE DAILY 90 tablet 0   Colostrum 500 MG CAPS Take 500 mg by mouth daily.      Glycerin-Hypromellose-PEG 400 (DRY EYE RELIEF DROPS) 0.2-0.2-1 % SOLN Place 1 drop into both eyes daily as needed (Dry eye).     levothyroxine (SYNTHROID) 50 MCG tablet TAKE 1 TABLET BY MOUTH ONCE DAILY ON AN EMPTY STOMACH. WAIT 30 MINUTES BEFORE TAKING OTHER MEDS. 90 tablet 1   Multiple Vitamin (MULTIVITAMIN WITH MINERALS) TABS tablet Take 1 tablet by mouth daily.     propranolol (INDERAL) 10 MG tablet TAKE 1 TABLET BY MOUTH 3 TIMES DAILY AS NEEDED FOR TREMOR 90 tablet 3   rosuvastatin (CRESTOR) 40 MG tablet Take 1 tablet (40 mg total) by mouth daily. 90 tablet 0   varenicline (CHANTIX CONTINUING MONTH PAK) 1 MG tablet Take 1 tablet (1 mg total) by mouth 2 (two) times daily. 60 tablet 2   varenicline (CHANTIX STARTING MONTH PAK) 0.5 MG X 11 & 1 MG X 42 tablet Take one 0.5 mg tablet by mouth once daily for 3 days, then increase to one 0.5 mg tablet twice daily for 4 days, then increase to one 1 mg tablet twice daily. 53 tablet 0   mirtazapine (REMERON) 15 MG tablet Take 1 tablet (15 mg total) by mouth at bedtime. (Patient not taking:  Reported on 02/13/2022) 90 tablet 1   No current facility-administered medications for this visit.    Allergies:   Atorvastatin, Darvocet [propoxyphene n-acetaminophen], Demerol, Methocarbamol, Mirtazapine, Primidone, and Ambien [zolpidem tartrate]    Social History:  The patient  reports that she has been smoking cigarettes. She has a 20.00 pack-year smoking history. She has never used smokeless tobacco. She reports that she does not currently use alcohol. She reports that she does not use drugs.   Family History:  The patient's family history includes Alzheimer's disease in her father; Coronary artery disease in her maternal grandmother; Coronary artery disease (age of onset: 52) in her paternal grandmother; Heart disease (age of onset: 70) in her maternal grandmother; Heart disease (age of onset: 54) in her paternal grandmother; Stroke in her mother.    ROS:  Please see the history of present illness.   Otherwise, review of systems are positive for none.   All other systems are reviewed and negative.  PHYSICAL EXAM: VS:  BP 110/70 (BP Location: Left Arm, Patient Position: Sitting, Cuff Size: Normal)   Pulse 68   Ht 5' 7.5" (1.715 m)   Wt 125 lb 4 oz (56.8 kg)   SpO2 97%   BMI 19.33 kg/m  , BMI Body mass index is 19.33 kg/m. GEN: Well nourished, well developed, in no acute distress  HEENT: normal  Neck: no JVD, carotid bruits, or masses Cardiac: RRR; no murmurs, rubs, or gallops,no edema  Respiratory:  clear to auscultation bilaterally, normal work of breathing GI: soft, nontender, nondistended, + BS MS: no deformity or atrophy  Skin: warm and dry, no rash Neuro:  Strength and sensation are intact Psych: euthymic mood, full affect Vascular: Posterior tibial and dorsalis pedis pulses palpable on the right.  On the left, the posterior tibial is palpable.   EKG:  EKG is ordered today. EKG showed normal sinus rhythm with no significant ST or T wave changes.   Recent  Labs: 12/07/2021: ALT 12; BUN 13; Creatinine, Ser 0.81; Hemoglobin 14.7; Platelets 238.0; Potassium 3.9; Sodium 139; TSH 4.20    Lipid Panel    Component Value Date/Time   CHOL 123 12/07/2021 1203   CHOL 196 02/29/2020 1206   TRIG 70.0 12/07/2021 1203   HDL 46.30 12/07/2021 1203   HDL 46 02/29/2020 1206   CHOLHDL 3 12/07/2021 1203   VLDL 14.0 12/07/2021 1203   LDLCALC 63 12/07/2021 1203   LDLCALC 133 (H) 02/29/2020 1206      Wt Readings from Last 3 Encounters:  02/13/22 125 lb 4 oz (56.8 kg)  01/10/22 127 lb 3.2 oz (57.7 kg)  12/07/21 125 lb 9.6 oz (57 kg)        ASSESSMENT AND PLAN:  1.  Peripheral arterial disease: With recurrent stent thrombosis of the left common iliac artery likely due to hypercoagulable state.   No recurrent claudication since most recent intervention..  I I reviewed recent Doppler studies with her which showed normal ABI and patent left iliac stent.  Repeat study in 1 year.  2. Hyperlipidemia: I reviewed recent labs which showed an LDL of 63.  Continue high-dose rosuvastatin 40 mg daily.  3. Carotid disease: This was mild on recent carotid Dopple in May of 2021.  4. Coronary calcifications: No anginal symptoms. Continue aggressive treatment of risk factors.  5. Tobacco use: She quit smoking again recently with Chantix.    Disposition: Follow-up with me in 12 months.  Signed,  [name], MD  02/13/2022 1:32 PM    [facility]

## 2022-02-14 NOTE — Telephone Encounter (Signed)
scheduled

## 2022-02-20 ENCOUNTER — Ambulatory Visit (INDEPENDENT_AMBULATORY_CARE_PROVIDER_SITE_OTHER): Payer: Medicare PPO

## 2022-02-20 VITALS — Ht 67.5 in | Wt 125.0 lb

## 2022-02-20 DIAGNOSIS — Z0000 Encounter for general adult medical examination without abnormal findings: Secondary | ICD-10-CM | POA: Diagnosis not present

## 2022-02-20 NOTE — Progress Notes (Signed)
Subjective:   Tracie Morris is a 66 y.o. female who presents for an Initial Medicare Annual Wellness Visit.  Review of Systems    No ROS.  Medicare Wellness Virtual Visit.  Visual/audio telehealth visit, UTA vital signs.   See social history for additional risk factors.   Cardiac Risk Factors include: advanced age (>63men, >50 women)     Objective:    Today's Vitals   02/20/22 0853  Weight: 125 lb (56.7 kg)  Height: 5' 7.5" (1.715 m)   Body mass index is 19.29 kg/m.     02/20/2022    9:08 AM 08/29/2021   10:12 AM 10/05/2020    9:07 AM 09/22/2020    8:16 AM 08/19/2020   10:39 AM 02/12/2020   10:02 AM 02/28/2019    3:32 PM  Advanced Directives  Does Patient Have a Medical Advance Directive? Yes Yes Yes Yes No No;Yes No  Type of Estate agent of [place];Living will Healthcare Power of [place];Living will Healthcare Power of [place];Living will Healthcare Power of [place];Living will  Healthcare Power of [place];Living will   Does patient want to make changes to medical advance directive? No - Patient declined  No - Patient declined No - Patient declined  No - Patient declined   Copy of Healthcare Power of Attorney in Chart? No - copy requested  No - copy requested No - copy requested  No - copy requested   Would patient like information on creating a medical advance directive?     No - Patient declined  No - Patient declined    Current Medications (verified) Outpatient Encounter Medications as of 02/20/2022  Medication Sig   aspirin EC 81 MG tablet Take 1 tablet (81 mg total) by mouth daily. Swallow whole.   clopidogrel (PLAVIX) 75 MG tablet TAKE 1 TABLET BY MOUTH ONCE DAILY   Colostrum 500 MG CAPS Take 500 mg by mouth daily.    Glycerin-Hypromellose-PEG 400 (DRY EYE RELIEF DROPS) 0.2-0.2-1 % SOLN Place 1 drop into both eyes daily as needed (Dry eye).   levothyroxine (SYNTHROID) 50 MCG tablet TAKE 1 TABLET BY MOUTH ONCE DAILY ON AN EMPTY STOMACH.  WAIT 30 MINUTES BEFORE TAKING OTHER MEDS.   mirtazapine (REMERON) 15 MG tablet Take 1 tablet (15 mg total) by mouth at bedtime. (Patient not taking: Reported on 02/13/2022)   Multiple Vitamin (MULTIVITAMIN WITH MINERALS) TABS tablet Take 1 tablet by mouth daily.   propranolol (INDERAL) 10 MG tablet TAKE 1 TABLET BY MOUTH 3 TIMES DAILY AS NEEDED FOR TREMOR   rosuvastatin (CRESTOR) 40 MG tablet Take 1 tablet (40 mg total) by mouth daily.   varenicline (CHANTIX CONTINUING MONTH PAK) 1 MG tablet Take 1 tablet (1 mg total) by mouth 2 (two) times daily.   varenicline (CHANTIX STARTING MONTH PAK) 0.5 MG X 11 & 1 MG X 42 tablet Take one 0.5 mg tablet by mouth once daily for 3 days, then increase to one 0.5 mg tablet twice daily for 4 days, then increase to one 1 mg tablet twice daily.   No facility-administered encounter medications on file as of 02/20/2022.    Allergies (verified) Atorvastatin, Darvocet [propoxyphene n-acetaminophen], Demerol, Methocarbamol, Mirtazapine, Primidone, and Ambien [zolpidem tartrate]   History: Past Medical History:  Diagnosis Date   Arthritis    Basal cell carcinoma 03/29/2014   L lower abdomen   Basal cell carcinoma 01/18/2020   central nasal tip[facility]   Breast mass 09/05/2020   right breast mass  10 oclock   Carotid artery occlusion    Hemochromatosis    Hyperlipidemia    Hypocalcemia    hypothyroidism 2007   acquired, secondary to surgery for thyroid Ca   Hypothyroidism    Migraine    improved since thyroid surgery, 2 years   multinodular goiter 2007   s/p thyroidectomy   Peripheral arterial disease ([facility])    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 Repeat angiography in November of 2014 showed 50% distal aortic stenosis extending into the left common iliac artery which had 60-70% ostial stenosis. No other significant disease. I placed on balloon expandable stent into  left common  iliac artery extending slightly into the distal aorta   PONV (postoperative nausea and vomiting)    Squamous cell carcinoma of skin 06/23/2018   R nasal tip/in situ, txt'd  with [facility]    Past Surgical History:  Procedure Laterality Date   ABDOMINAL AORTAGRAM N/A 10/14/2013   Procedure: ABDOMINAL Ronny Flurry;  Surgeon: Iran Ouch, MD;  Location: [facility];  Service: Cardiovascular;  Laterality: N/A;   ABDOMINAL AORTOGRAM W/LOWER EXTREMITY N/A 10/05/2020   Procedure: ABDOMINAL AORTOGRAM W/LOWER EXTREMITY;  Surgeon: Iran Ouch, MD;  Location: [facility] IN[facility];  Service: Cardiovascular;  Laterality: N/A;   ANGIOPLASTY ILLIAC ARTERY     CESAREAN SECTION     COLONOSCOPY WITH PROPOFOL N/A 09/22/2020   Procedure: COLONOSCOPY WITH PROPOFOL;  Surgeon: Toney Reil, MD;  [facility];  Service: Endoscopy;  Laterality: N/A;   NOSE SURGERY     PERIPHERAL VASCULAR CATHETERIZATION N/A 11/14/2016   Procedure: Abdominal Aortogram;  Surgeon: Iran Ouch, MD;  [facility]: [facility];  Service: Cardiovascular;  Laterality: N/A;   PERIPHERAL VASCULAR CATHETERIZATION Bilateral 11/14/2016   Procedure: Lower Extremity Angiography;  Surgeon: Iran Ouch, MD;  [facility]: [facility];  Service: Cardiovascular;  Laterality: Bilateral;  limited iliacs only   PERIPHERAL VASCULAR CATHETERIZATION Left 11/14/2016   Procedure: Peripheral Vascular Intervention;  Surgeon: Iran Ouch, MD;  [facility];  Service: Cardiovascular;  Laterality: Left;  common iliac   PERIPHERAL VASCULAR INTERVENTION  10/05/2020   Procedure: PERIPHERAL VASCULAR INTERVENTION;  Surgeon: Iran Ouch, MD;  [facility]: [facility];  Service: Cardiovascular;;  left common iliac    PERIPHERAL VASCULAR THROMBECTOMY  10/05/2020   Procedure: PERIPHERAL VASCULAR THROMBECTOMY;  Surgeon: Iran Ouch, MD;  [facility];  Service: Cardiovascular;;   Mechanical - Left common iliac, left internal iliac, and left SFA   POLYPECTOMY  09/22/2020   Procedure: POLYPECTOMY;  Surgeon: Toney Reil, MD;  Location: [facility];  Service: Endoscopy;;   SKIN CANCER EXCISION     THYROID SURGERY  2007   secondary to thyroid cancer   Family History  Problem Relation Age of Onset   Alzheimer's disease Father    Stroke Mother    Coronary artery disease Maternal Grandmother    Heart disease Maternal Grandmother 38   Coronary artery disease Paternal Grandmother 89   Heart disease Paternal Grandmother 63   Breast cancer Neg Hx    Social History   Socioeconomic History   Marital status: Married    Spouse name: Not on file   Number of children: Not on file   Years of education: Not on file   Highest education level: Not on file  Occupational History   Not on  file  Tobacco Use   Smoking status: Every Day    Packs/day: 1.00    Years: 20.00    Pack years: 20.00    Types: Cigarettes    Last attempt to quit: 10/28/2011    Years since quitting: 10.3   Smokeless tobacco: Never  Vaping Use   Vaping Use: Former  Substance and Sexual Activity   Alcohol use: Not Currently   Drug use: No   Sexual activity: Not on file  Other Topics Concern   Not on file  Social History Narrative   Not on file   Social Determinants of Health   Financial Resource Strain: Low Risk    Difficulty of Paying Living Expenses: Not hard at all  Food Insecurity: No Food Insecurity   Worried About Programme researcher, broadcasting/film/video in the Last Year: Never true   Ran Out of Food in the Last Year: Never true  Transportation Needs: No Transportation Needs   Lack of Transportation (Medical): No   Lack of Transportation (Non-Medical): No  Physical Activity: Not on file  Stress: No Stress Concern Present   Feeling of Stress : Not at all  Social Connections: Unknown   Frequency of Communication with Friends and Family: Not on file   Frequency of Social Gatherings with  Friends and Family: Not on file   Attends Religious Services: Not on file   Active Member of Clubs or Organizations: Not on file   Attends Banker Meetings: Not on file   Marital Status: Married    Tobacco Counseling Ready to quit: Not Answered Counseling given: Not Answered   Clinical Intake:  Pre-visit preparation completed: Yes        Diabetes: No  How often do you need to have someone help you when you read instructions, pamphlets, or other written materials from your doctor or pharmacy?: 1 - Never  Interpreter Needed?: No      Activities of Daily Living    02/20/2022    9:10 AM 12/07/2021   11:38 AM  In your present state of health, do you have any difficulty performing the following activities:  Hearing? 0 0  Vision? 0 0  Difficulty concentrating or making decisions? 0 0  Walking or climbing stairs? 0 0  Dressing or bathing? 0 0  Doing errands, shopping? 0 0  Preparing Food and eating ? N   Using the Toilet? N   In the past six months, have you accidently leaked urine? N   Do you have problems with loss of bowel control? N   Managing your Medications? N   Managing your Finances? N   Housekeeping or managing your Housekeeping? Y   Comment Maid assist every other week     Patient Care Team: Sherlene Shams, MD as PCP - General (Internal Medicine) Iran Ouch, MD as PCP - Cardiology (Cardiology) Sherlene Shams, MD (Internal Medicine)  Indicate any recent Medical Services you may have received from other than [facility] providers in the past year (date may be approximate).     Assessment:   This is a routine wellness examination for Anikka.  Virtual Visit via Telephone Note  I connected with  Courteny Macho on 02/20/22 at  8:45 AM EDT by telephone and verified that I am speaking with the correct person using two identifiers.  Persons participating in the virtual visit: patient/Nurse Health Advisor   I discussed the limitations of  performing an evaluation and management service by telehealth.  The  patient expressed understanding and agreed to proceed.  We continued and completed visit with audio only. Some vital signs may be absent or patient reported.   Hearing/Vision screen Hearing Screening - Comments:: Patient is able to hear conversational tones without difficulty.  No issues reported.  Vision Screening - Comments:: They have seen their ophthalmologist in the last 12 months.    Dietary issues and exercise activities discussed: Current Exercise Habits: Home exercise routine, Intensity: Mild Healthy diet Good water intake   Goals Addressed               This Visit's Progress     Patient Stated     Increase physical activity (pt-stated)        Start exercising at the gym       Depression Screen    02/20/2022    9:07 AM 12/07/2021   12:20 PM 09/06/2021    3:43 PM 08/03/2021   12:51 PM 12/19/2020    2:24 PM 04/09/2016   10:32 AM  PHQ 2/9 Scores  PHQ - 2 Score 0 1 0 6 1 0  PHQ- 9 Score  5 0 18 12     Fall Risk    02/20/2022    9:10 AM 09/06/2021    3:43 PM 12/19/2020    1:59 PM 08/25/2020   12:21 PM 04/09/2016   10:32 AM  Fall Risk   Falls in the past year? 0 0 0 0 No  Number falls in past yr: 0      Risk for fall due to :  No Fall Risks     Follow up Falls evaluation completed Falls evaluation completed Falls evaluation completed Falls evaluation completed     FALL RISK PREVENTION PERTAINING TO THE HOME: Home free of loose throw rugs in walkways, pet beds, electrical cords, etc? Yes  Adequate lighting in your home to reduce risk of falls? Yes   ASSISTIVE DEVICES UTILIZED TO PREVENT FALLS: Life alert? No  Use of a cane, walker or w/c? No   TIMED UP AND GO: Was the test performed? No .   Cognitive Function:  Patient is alert and oriented x3.  Normal cognitive status assessed by direct observation/communication. No abnormalities found.        Immunizations Immunization History   Administered Date(s) Administered   Fluad Quad(high Dose 65+) 09/06/2021   Influenza Split 10/19/2011, 09/10/2013, 10/07/2015   Influenza, Quadrivalent, Recombinant, Inj, Pf 10/12/2019   Influenza,inj,Quad PF,6+ Mos 10/05/2016, 08/18/2018   Influenza,inj,quad, With Preservative 10/07/2017   Influenza-Unspecified 09/26/2012, 07/27/2014, 09/30/2017   Janssen (J&J) SARS-COV-2 Vaccination 03/04/2020, 11/15/2020   Pneumococcal Conjugate-13 12/14/2015   Pneumococcal Polysaccharide-23 08/26/2009   Tdap 01/11/2014   Zoster, Live 12/06/2016   Pneumococcal vaccine status: Due, Education has been provided regarding the importance of this vaccine. Advised may receive this vaccine at local pharmacy or Health Dept. Aware to provide a copy of the vaccination record if obtained from local pharmacy or Health Dept. Verbalized acceptance and understanding.  Shingrix Completed?: No.    Education has been provided regarding the importance of this vaccine. Patient has been advised to call insurance company to determine out of pocket expense if they have not yet received this vaccine. Advised may also receive vaccine at local pharmacy or Health Dept. Verbalized acceptance and understanding.  Screening Tests Health Maintenance  Topic Date Due   COVID-19 Vaccine (3 - Booster for Janssen series) 03/08/2022 (Originally 01/10/2021)   Zoster Vaccines- Shingrix (1 of 2) 05/23/2022 (Originally 06/29/1975)  Pneumonia Vaccine 24+ Years old (3 - PPSV23 if available, else PCV20) 02/21/2023 (Originally 06/29/2020)   MAMMOGRAM  09/12/2022   TETANUS/TDAP  01/12/2024   COLONOSCOPY (Pts 45-76yrs Insurance coverage will need to be confirmed)  09/22/2025   INFLUENZA VACCINE  Completed   DEXA SCAN  Completed   Hepatitis C Screening  Completed   HPV VACCINES  Aged Out   Health Maintenance There are no preventive care reminders to display for this patient.  Hepatitis C Screening: Completed   Vision Screening: Recommended  annual ophthalmology exams for early detection of glaucoma and other disorders of the eye.  Dental Screening: Recommended annual dental exams for proper oral hygiene  Community Resource Referral / Chronic Care Management: CRR required this visit?  No   CCM required this visit?  No      Plan:   Keep all routine maintenance appointments.   I have personally reviewed and noted the following in the patient's chart:   Medical and social history Use of alcohol, tobacco or illicit drugs  Current medications and supplements including opioid prescriptions. Patient is not currently taking opioid prescriptions. Functional ability and status Nutritional status Physical activity Advanced directives List of other physicians Hospitalizations, surgeries, and ER visits in previous 12 months Vitals Screenings to include cognitive, depression, and falls Referrals and appointments  In addition, I have reviewed and discussed with patient certain preventive protocols, quality metrics, and best practice recommendations. A written personalized care plan for preventive services as well as general preventive health recommendations were provided to patient.     Ashok Pall, LPN   [id number]/61/0960

## 2022-02-20 NOTE — Patient Instructions (Addendum)
?  Ms. Tracie Morris , ?Thank you for taking time to come for your Medicare Wellness Visit. I appreciate your ongoing commitment to your health goals. Please review the following plan we discussed and let me know if I can assist you in the future.  ? ?These are the goals we discussed: ? Goals   ? ?  ? Patient Stated  ?   Increase physical activity (pt-stated)   ?   Start exercising at the gym ?  ? ?  ?  ?This is a list of the screening recommended for you and due dates:  ?Health Maintenance  ?Topic Date Due  ? COVID-19 Vaccine (3 - Booster for Janssen series) 03/08/2022*  ? Zoster (Shingles) Vaccine (1 of 2) 05/23/2022*  ? Pneumonia Vaccine (3 - PPSV23 if available, else PCV20) 02/21/2023*  ? Mammogram  09/12/2022  ? Tetanus Vaccine  01/12/2024  ? Colon Cancer Screening  09/22/2025  ? Flu Shot  Completed  ? DEXA scan (bone density measurement)  Completed  ? Hepatitis C Screening: USPSTF Recommendation to screen - Ages 9-79 yo.  Completed  ? HPV Vaccine  Aged Out  ?*Topic was postponed. The date shown is not the original due date.  ?  ?

## 2022-02-23 NOTE — Addendum Note (Signed)
Addended by: Britt Bottom on: 02/23/2022 04:25 PM ? ? Modules accepted: Orders ? ?

## 2022-03-12 ENCOUNTER — Other Ambulatory Visit: Payer: Medicare PPO

## 2022-03-16 ENCOUNTER — Ambulatory Visit (INDEPENDENT_AMBULATORY_CARE_PROVIDER_SITE_OTHER): Payer: Medicare PPO

## 2022-03-16 DIAGNOSIS — M810 Age-related osteoporosis without current pathological fracture: Secondary | ICD-10-CM

## 2022-03-16 MED ORDER — DENOSUMAB 60 MG/ML ~~LOC~~ SOSY
60.0000 mg | PREFILLED_SYRINGE | Freq: Once | SUBCUTANEOUS | Status: AC
  Administered 2022-03-16: 60 mg via SUBCUTANEOUS

## 2022-03-16 NOTE — Progress Notes (Addendum)
Patient came in today for Prolia injection given in left arm SQ. Patient tolerated well with no signs of distress.  °

## 2022-03-20 ENCOUNTER — Other Ambulatory Visit: Payer: Medicare PPO

## 2022-04-09 ENCOUNTER — Other Ambulatory Visit: Payer: Self-pay | Admitting: Internal Medicine

## 2022-04-27 ENCOUNTER — Other Ambulatory Visit: Payer: Self-pay | Admitting: Cardiovascular Disease

## 2022-05-08 ENCOUNTER — Other Ambulatory Visit: Payer: Self-pay | Admitting: Internal Medicine

## 2022-06-15 ENCOUNTER — Other Ambulatory Visit: Payer: Self-pay | Admitting: Internal Medicine

## 2022-06-19 ENCOUNTER — Ambulatory Visit: Payer: Medicare PPO | Admitting: Dermatology

## 2022-06-19 DIAGNOSIS — L570 Actinic keratosis: Secondary | ICD-10-CM | POA: Diagnosis not present

## 2022-06-19 DIAGNOSIS — Z86007 Personal history of in-situ neoplasm of skin: Secondary | ICD-10-CM

## 2022-06-19 DIAGNOSIS — L578 Other skin changes due to chronic exposure to nonionizing radiation: Secondary | ICD-10-CM | POA: Diagnosis not present

## 2022-06-19 DIAGNOSIS — D229 Melanocytic nevi, unspecified: Secondary | ICD-10-CM | POA: Diagnosis not present

## 2022-06-19 DIAGNOSIS — D1800 Hemangioma unspecified site: Secondary | ICD-10-CM

## 2022-06-19 DIAGNOSIS — D239 Other benign neoplasm of skin, unspecified: Secondary | ICD-10-CM

## 2022-06-19 DIAGNOSIS — D2262 Melanocytic nevi of left upper limb, including shoulder: Secondary | ICD-10-CM | POA: Diagnosis not present

## 2022-06-19 DIAGNOSIS — L820 Inflamed seborrheic keratosis: Secondary | ICD-10-CM | POA: Diagnosis not present

## 2022-06-19 DIAGNOSIS — L821 Other seborrheic keratosis: Secondary | ICD-10-CM | POA: Diagnosis not present

## 2022-06-19 DIAGNOSIS — L814 Other melanin hyperpigmentation: Secondary | ICD-10-CM

## 2022-06-19 DIAGNOSIS — D2339 Other benign neoplasm of skin of other parts of face: Secondary | ICD-10-CM

## 2022-06-19 DIAGNOSIS — Z1283 Encounter for screening for malignant neoplasm of skin: Secondary | ICD-10-CM | POA: Diagnosis not present

## 2022-06-19 DIAGNOSIS — Z85828 Personal history of other malignant neoplasm of skin: Secondary | ICD-10-CM

## 2022-06-19 DIAGNOSIS — R238 Other skin changes: Secondary | ICD-10-CM

## 2022-06-19 DIAGNOSIS — D2222 Melanocytic nevi of left ear and external auricular canal: Secondary | ICD-10-CM

## 2022-06-19 NOTE — Patient Instructions (Signed)
Cryotherapy Aftercare  Wash gently with soap and water everyday.   Apply Vaseline and Band-Aid daily until healed.   Melanoma ABCDEs  Melanoma is the most dangerous type of skin cancer, and is the leading cause of death from skin disease.  You are more likely to develop melanoma if you: Have light-colored skin, light-colored eyes, or red or blond hair Spend a lot of time in the sun Tan regularly, either outdoors or in a tanning bed Have had blistering sunburns, especially during childhood Have a close family member who has had a melanoma Have atypical moles or large birthmarks  Early detection of melanoma is key since treatment is typically straightforward and cure rates are extremely high if we catch it early.   The first sign of melanoma is often a change in a mole or a new dark spot.  The ABCDE system is a way of remembering the signs of melanoma.  A for asymmetry:  The two halves do not match. B for border:  The edges of the growth are irregular. C for color:  A mixture of colors are present instead of an even brown color. D for diameter:  Melanomas are usually (but not always) greater than 6mm - the size of a pencil eraser. E for evolution:  The spot keeps changing in size, shape, and color.  Please check your skin once per month between visits. You can use a small mirror in front and a large mirror behind you to keep an eye on the back side or your body.   If you see any new or changing lesions before your next follow-up, please call to schedule a visit.  Please continue daily skin protection including broad spectrum sunscreen SPF 30+ to sun-exposed areas, reapplying every 2 hours as needed when you're outdoors.   Staying in the shade or wearing long sleeves, sun glasses (UVA+UVB protection) and wide brim hats (4-inch brim around the entire circumference of the hat) are also recommended for sun protection.      Due to recent changes in healthcare laws, you may see results of  your pathology and/or laboratory studies on MyChart before the doctors have had a chance to review them. We understand that in some cases there may be results that are confusing or concerning to you. Please understand that not all results are received at the same time and often the doctors may need to interpret multiple results in order to provide you with the best plan of care or course of treatment. Therefore, we ask that you please give us 2 business days to thoroughly review all your results before contacting the office for clarification. Should we see a critical lab result, you will be contacted sooner.   If You Need Anything After Your Visit  If you have any questions or concerns for your doctor, please call our main line at [phone] and press option 4 to reach your doctor's medical assistant. If no one answers, please leave a voicemail as directed and we will return your call as soon as possible. Messages left after 4 pm will be answered the following business day.   You may also send us a message via MyChart. We typically respond to MyChart messages within 1-2 business days.  For prescription refills, please ask your pharmacy to contact our office. Our fax number is [phone].  If you have an urgent issue when the clinic is closed that cannot wait until the next business day, you can page your doctor at the number   below.    Please note that while we do our best to be available for urgent issues outside of office hours, we are not available 24/7.   If you have an urgent issue and are unable to reach us, you may choose to seek medical care at your doctor's office, retail clinic, urgent care center, or emergency room.  If you have a medical emergency, please immediately call 911 or go to the emergency department.  Pager Numbers  - Dr. Kowalski: [phone]  - Dr. Moye: [phone]  - Dr. Stewart: [phone]  In the event of inclement weather, please call our main line at  [phone] for an update on the status of any delays or closures.  Dermatology Medication Tips: Please keep the boxes that topical medications come in in order to help keep track of the instructions about where and how to use these. Pharmacies typically print the medication instructions only on the boxes and not directly on the medication tubes.   If your medication is too expensive, please contact our office at [phone] option 4 or send us a message through MyChart.   We are unable to tell what your co-pay for medications will be in advance as this is different depending on your insurance coverage. However, we may be able to find a substitute medication at lower cost or fill out paperwork to get insurance to cover a needed medication.   If a prior authorization is required to get your medication covered by your insurance company, please allow us 1-2 business days to complete this process.  Drug prices often vary depending on where the prescription is filled and some pharmacies may offer cheaper prices.  The website www.goodrx.com contains coupons for medications through different pharmacies. The prices here do not account for what the cost may be with help from insurance (it may be cheaper with your insurance), but the website can give you the price if you did not use any insurance.  - You can print the associated coupon and take it with your prescription to the pharmacy.  - You may also stop by our office during regular business hours and pick up a GoodRx coupon card.  - If you need your prescription sent electronically to a different pharmacy, notify our office through [facility] MyChart or by phone at [phone] option 4.     Si Usted Necesita Algo Despus de Su Visita  Tambin puede enviarnos un mensaje a travs de MyChart. Por lo general respondemos a los mensajes de MyChart en el transcurso de 1 a 2 das hbiles.  Para renovar recetas, por favor pida a su farmacia que se  ponga en contacto con nuestra oficina. Nuestro nmero de fax es el [phone].  Si tiene un asunto urgente cuando la clnica est cerrada y que no puede esperar hasta el siguiente da hbil, puede llamar/localizar a su doctor(a) al nmero que aparece a continuacin.   Por favor, tenga en cuenta que aunque hacemos todo lo posible para estar disponibles para asuntos urgentes fuera del horario de oficina, no estamos disponibles las 24 horas del da, los 7 das de la semana.   Si tiene un problema urgente y no puede comunicarse con nosotros, puede optar por buscar atencin mdica  en el consultorio de su doctor(a), en una clnica privada, en un centro de atencin urgente o en una sala de emergencias.  Si tiene una emergencia mdica, por favor llame inmediatamente al 911 o vaya a la sala de emergencias.  Nmeros de bper  -   Dr. Kowalski: [phone]  - Dra. Moye: [phone]  - Dra. Stewart: [phone]  En caso de inclemencias del tiempo, por favor llame a nuestra lnea principal al [phone] para una actualizacin sobre el estado de cualquier retraso o cierre.  Consejos para la medicacin en dermatologa: Por favor, guarde las cajas en las que vienen los medicamentos de uso tpico para ayudarle a seguir las instrucciones sobre dnde y cmo usarlos. Las farmacias generalmente imprimen las instrucciones del medicamento slo en las cajas y no directamente en los tubos del medicamento.   Si su medicamento es muy caro, por favor, pngase en contacto con nuestra oficina llamando al [phone] y presione la opcin 4 o envenos un mensaje a travs de MyChart.   No podemos decirle cul ser su copago por los medicamentos por adelantado ya que esto es diferente dependiendo de la cobertura de su seguro. Sin embargo, es posible que podamos encontrar un medicamento sustituto a menor costo o llenar un formulario para que el seguro cubra el medicamento que se considera necesario.   Si se requiere  una autorizacin previa para que su compaa de seguros cubra su medicamento, por favor permtanos de 1 a 2 das hbiles para completar este proceso.  Los precios de los medicamentos varan con frecuencia dependiendo del lugar de dnde se surte la receta y alguna farmacias pueden ofrecer precios ms baratos.  El sitio web www.goodrx.com tiene cupones para medicamentos de diferentes farmacias. Los precios aqu no tienen en cuenta lo que podra costar con la ayuda del seguro (puede ser ms barato con su seguro), pero el sitio web puede darle el precio si no utiliz ningn seguro.  - Puede imprimir el cupn correspondiente y llevarlo con su receta a la farmacia.  - Tambin puede pasar por nuestra oficina durante el horario de atencin regular y recoger una tarjeta de cupones de GoodRx.  - Si necesita que su receta se enve electrnicamente a una farmacia diferente, informe a nuestra oficina a travs de MyChart de [facility] o por telfono llamando al [phone] y presione la opcin 4.  

## 2022-06-19 NOTE — Progress Notes (Signed)
Follow-Up Visit   Subjective  Tracie Morris is a 66 y.o. female who presents for the following: Annual Exam.  The patient presents for Total-Body Skin Exam (TBSE) for skin cancer screening and mole check.  The patient has spots, moles and lesions to be evaluated, some may be new or changing. Spot on chest is irritated.  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 She also has an irritated spot on her chest she would like treated.  The following portions of the chart were reviewed this encounter and updated as appropriate:       Review of Systems:  No other skin or systemic complaints except as noted in HPI or Assessment and Plan.  Objective  Well appearing patient in no apparent distress; mood and affect are within normal limits.  A full examination was performed including scalp, head, eyes, ears, nose, lips, neck, chest, axillae, abdomen, back, buttocks, bilateral upper extremities, bilateral lower extremities, hands, feet, fingers, toes, fingernails, and toenails. All findings within normal limits unless otherwise noted below.  Left Upper Arm, Left Antihelix 0.2 cm med dark brown papule ( L upper arm) 0.2 cm blue grey macule (L anti helix)  Right Zygoma 2.5 mm blue papule  Right Anterior Shoulder Waxy tan macule with focal raised brown papules   left lower pretibia 4.0 mm bright pink papule of the left lower pretibia  Right Sternum Erythematous stuck-on, waxy papule  L paranasal x 1 Pink scaly macule.    Assessment & Plan  Skin cancer screening performed today.  Actinic Damage - chronic, secondary to cumulative UV radiation exposure/sun exposure over time - diffuse scaly erythematous macules with underlying dyspigmentation - Recommend daily broad spectrum sunscreen SPF 30+ to sun-exposed areas, reapply every 2 hours as needed.  - Recommend staying in the shade or wearing long sleeves,  sun glasses (UVA+UVB protection) and wide brim hats (4-inch brim around the entire circumference of the hat). - Call for new or changing lesions.  History of Basal Cell Carcinoma of the Skin - No evidence of recurrence today of the left lower abdomen and central nasal tip - Recommend regular full body skin exams - Recommend daily broad spectrum sunscreen SPF 30+ to sun-exposed areas, reapply every 2 hours as needed.  - Call if any new or changing lesions are noted between office visits  History of Squamous Cell Carcinoma in Situ of the Skin - No evidence of recurrence today of the right nasal tip - Recommend regular full body skin exams - Recommend daily broad spectrum sunscreen SPF 30+ to sun-exposed areas, reapply every 2 hours as needed.  - Call if any new or changing lesions are noted between office visits  Seborrheic Keratoses - Stuck-on, waxy, tan-brown papules and/or plaques     - Benign-appearing - Discussed benign etiology and prognosis. - Observe - Call for any changes  Lentigines - Scattered tan macules - Due to sun exposure - Benign-appering, observe - Recommend daily broad spectrum sunscreen SPF 30+ to sun-exposed areas, reapply every 2 hours as needed. - Call for any changes  Melanocytic Nevi - Tan-brown and/or pink-flesh-colored symmetric macules and papules - Benign appearing on exam today - Observation - Call clinic for new or changing moles - Recommend daily use of broad spectrum spf 30+ sunscreen to sun-exposed areas.   Hemangiomas - Red papules - Discussed benign nature - Observe - Call for any  changes  Nevus Left Upper Arm, Left Antihelix  Benign-appearing.  Stable. Observation.  Call clinic for new or changing moles.  Recommend daily use of broad spectrum spf 30+ sunscreen to sun-exposed areas.   Blue nevus Right Zygoma  Benign-appearing.  Stable. Observation.  Call clinic for new or changing moles.  Recommend daily use of broad spectrum spf 30+  sunscreen to sun-exposed areas.    Seborrheic keratosis Right Anterior Shoulder  Reassured benign age-related growth.  Recommend observation.  Discussed cryotherapy if spot(s) become irritated or inflamed.  Inflammatory papule left lower pretibia  Possible bite reaction. Benign. Observation.   Inflamed seborrheic keratosis Right Sternum  Symptomatic, irritating, patient would like treated.  Destruction of lesion - Right Sternum  Destruction method: cryotherapy   Informed consent: discussed and consent obtained   Lesion destroyed using liquid nitrogen: Yes   Region frozen until ice ball extended beyond lesion: Yes   Outcome: patient tolerated procedure well with no complications   Post-procedure details: wound care instructions given   Additional details:  Prior to procedure, discussed risks of blister formation, small wound, skin dyspigmentation, or rare scar following cryotherapy. Recommend Vaseline ointment to treated areas while healing.   AK (actinic keratosis) L paranasal x 1  Actinic keratoses are precancerous spots that appear secondary to cumulative UV radiation exposure/sun exposure over time. They are chronic with expected duration over 1 year. A portion of actinic keratoses will progress to squamous cell carcinoma of the skin. It is not possible to reliably predict which spots will progress to skin cancer and so treatment is recommended to prevent development of skin cancer.  Recommend daily broad spectrum sunscreen SPF 30+ to sun-exposed areas, reapply every 2 hours as needed.  Recommend staying in the shade or wearing long sleeves, sun glasses (UVA+UVB protection) and wide brim hats (4-inch brim around the entire circumference of the hat). Call for new or changing lesions.  Destruction of lesion - L paranasal x 1  Destruction method: cryotherapy   Informed consent: discussed and consent obtained   Lesion destroyed using liquid nitrogen: Yes   Region frozen  until ice ball extended beyond lesion: Yes   Outcome: patient tolerated procedure well with no complications   Post-procedure details: wound care instructions given   Additional details:  Prior to procedure, discussed risks of blister formation, small wound, skin dyspigmentation, or rare scar following cryotherapy. Recommend Vaseline ointment to treated areas while healing.    Return in about 1 year (around 06/20/2023) for TBSE, Hx BCC, Hx SCC, Hx AKs.  IJamesetta Orleans, CMA, am acting as scribe for [name], MD .  Documentation: I have reviewed the above documentation for accuracy and completeness, and I agree with the above.  [name] MD

## 2022-07-05 ENCOUNTER — Other Ambulatory Visit: Payer: Self-pay | Admitting: Internal Medicine

## 2022-07-12 ENCOUNTER — Encounter: Payer: Self-pay | Admitting: Internal Medicine

## 2022-07-12 ENCOUNTER — Ambulatory Visit: Payer: Medicare PPO | Admitting: Internal Medicine

## 2022-07-12 VITALS — BP 138/72 | HR 72 | Temp 98.4°F | Ht 67.5 in | Wt 120.4 lb

## 2022-07-12 DIAGNOSIS — Z1231 Encounter for screening mammogram for malignant neoplasm of breast: Secondary | ICD-10-CM

## 2022-07-12 DIAGNOSIS — R7301 Impaired fasting glucose: Secondary | ICD-10-CM | POA: Diagnosis not present

## 2022-07-12 DIAGNOSIS — R911 Solitary pulmonary nodule: Secondary | ICD-10-CM | POA: Diagnosis not present

## 2022-07-12 DIAGNOSIS — K700 Alcoholic fatty liver: Secondary | ICD-10-CM

## 2022-07-12 DIAGNOSIS — Z79899 Other long term (current) drug therapy: Secondary | ICD-10-CM

## 2022-07-12 DIAGNOSIS — F411 Generalized anxiety disorder: Secondary | ICD-10-CM

## 2022-07-12 DIAGNOSIS — E7800 Pure hypercholesterolemia, unspecified: Secondary | ICD-10-CM | POA: Diagnosis not present

## 2022-07-12 DIAGNOSIS — E032 Hypothyroidism due to medicaments and other exogenous substances: Secondary | ICD-10-CM

## 2022-07-12 DIAGNOSIS — I798 Other disorders of arteries, arterioles and capillaries in diseases classified elsewhere: Secondary | ICD-10-CM

## 2022-07-12 DIAGNOSIS — F339 Major depressive disorder, recurrent, unspecified: Secondary | ICD-10-CM

## 2022-07-12 LAB — CBC WITH DIFFERENTIAL/PLATELET
Basophils Absolute: 0.1 10*3/uL (ref 0.0–0.1)
Basophils Relative: 0.5 % (ref 0.0–3.0)
Eosinophils Absolute: 0.2 10*3/uL (ref 0.0–0.7)
Eosinophils Relative: 1.3 % (ref 0.0–5.0)
HCT: 48 % — ABNORMAL HIGH (ref 36.0–46.0)
Hemoglobin: 16.1 g/dL — ABNORMAL HIGH (ref 12.0–15.0)
Lymphocytes Relative: 37.7 % (ref 12.0–46.0)
Lymphs Abs: 6 10*3/uL — ABNORMAL HIGH (ref 0.7–4.0)
MCHC: 33.5 g/dL (ref 30.0–36.0)
MCV: 99.4 fl (ref 78.0–100.0)
Monocytes Absolute: 0.7 10*3/uL (ref 0.1–1.0)
Monocytes Relative: 4.7 % (ref 3.0–12.0)
Neutro Abs: 8.9 10*3/uL — ABNORMAL HIGH (ref 1.4–7.7)
Neutrophils Relative %: 55.8 % (ref 43.0–77.0)
Platelets: 233 10*3/uL (ref 150.0–400.0)
RBC: 4.83 Mil/uL (ref 3.87–5.11)
RDW: 13.4 % (ref 11.5–15.5)
WBC: 15.9 10*3/uL — ABNORMAL HIGH (ref 4.0–10.5)

## 2022-07-12 LAB — LIPID PANEL
Cholesterol: 114 mg/dL (ref 0–200)
HDL: 47.1 mg/dL (ref >39.00)
LDL Cholesterol: 50 mg/dL (ref 0–99)
NonHDL: 66.8
Total CHOL/HDL Ratio: 2
Triglycerides: 83 mg/dL (ref 0.0–149.0)
VLDL: 16.6 mg/dL (ref 0.0–40.0)

## 2022-07-12 LAB — COMPREHENSIVE METABOLIC PANEL
ALT: 13 U/L (ref 0–35)
AST: 19 U/L (ref 0–37)
Albumin: 4.4 g/dL (ref 3.5–5.2)
Alkaline Phosphatase: 67 U/L (ref 39–117)
BUN: 9 mg/dL (ref 6–23)
CO2: 28 mEq/L (ref 19–32)
Calcium: 9.8 mg/dL (ref 8.4–10.5)
Chloride: 103 mEq/L (ref 96–112)
Creatinine, Ser: 0.86 mg/dL (ref 0.40–1.20)
GFR: 70.09 mL/min (ref >60.00)
Glucose, Bld: 99 mg/dL (ref 70–99)
Potassium: 4.5 mEq/L (ref 3.5–5.1)
Sodium: 140 mEq/L (ref 135–145)
Total Bilirubin: 0.4 mg/dL (ref 0.2–1.2)
Total Protein: 7 g/dL (ref 6.0–8.3)

## 2022-07-12 LAB — IBC + FERRITIN
Ferritin: 34.5 ng/mL (ref 10.0–291.0)
Iron: 177 ug/dL — ABNORMAL HIGH (ref 42–145)
Saturation Ratios: 42.1 % (ref 20.0–50.0)
TIBC: 420 ug/dL (ref 250.0–450.0)
Transferrin: 300 mg/dL (ref 212.0–360.0)

## 2022-07-12 LAB — LDL CHOLESTEROL, DIRECT: Direct LDL: 57 mg/dL

## 2022-07-12 LAB — HEMOGLOBIN A1C: Hgb A1c MFr Bld: 6.4 % (ref 4.6–6.5)

## 2022-07-12 LAB — TSH: TSH: 5.2 u[IU]/mL (ref 0.35–5.50)

## 2022-07-12 MED ORDER — ESCITALOPRAM OXALATE 10 MG PO TABS: 10.0000 mg | ORAL_TABLET | Freq: Every day | ORAL | 5 refills | Status: DC

## 2022-07-12 NOTE — Patient Instructions (Addendum)
Please start the Lexapro (escitalopram) at 1/2 tablet daily  with a meal for the first few days to avoid nausea.  You can increase to a full tablet after 4 days if you have not developed side effects of nausea.  Please return in  4 weeks ,  Or e mail me to let me know how it is helping your depression   Referral to Eric Form for follow up on Pulmonary Nodule   Your annual mammogram has been ordered AND IS DUE in LATE October .    [facility] will not allow [place] to schedule it for you,  so please  call to make your appointment [phone]

## 2022-07-12 NOTE — Progress Notes (Signed)
Subjective:  Patient ID: Tracie Morris, female    DOB: November 02, 1956  Age: 66 y.o. MRN: [id number]  CC: The primary encounter diagnosis was Pure hypercholesterolemia. Diagnoses of Alcoholic fatty liver, Impaired fasting glucose, Iatrogenic hypothyroidism, Long-term use of high-risk medication, Peripheral vascular disease, secondary ([place]), Hereditary hemochromatosis ([place]), Pulmonary nodule, Breast cancer screening by mammogram, Episode of recurrent major depressive disorder, unspecified depression episode severity ([place]), and GAD (generalized anxiety disorder) were also pertinent to this visit.   HPI Tracie Morris presents for follow up on multiple issues Chief Complaint  Patient presents with   Follow-up    6 month follow up    1) Major depressive disorder: patient last seen in February.  Had a 2 week vacation  by herself visiting a friend in  [place] and felt great in April.  Once she returned to [facility] and her current situation with Orpah Greek she  became significantly depressed, lost 5 lbs,  She has resumed mirtazapine  because she has lost weight again.  She has been married to Tracie Morris for 72 years. And is his primary caregiver and spent most of the visit discussing Tracie Morris's medical problems and current state.  Orpah Greek has ESKD ,  severe   advanced diffuse PAD,  is on peritoneal dialysis and miserable due to pruritis , he  has been considering stopping dialysis  or changing to hemodialysis for 30 days to "break the cycle." She is requesting restart of lexapro.   2) smoking "some" but not much  :  one  pack  daily  since returning from [place]   3) alcohol abuse: has occasional glass of wine ("1 or 2") when eating at a restaurant. Believes she has it under control and does not drink daily .  Her last episode of inebriation occurred 3 months after returning from detox/rehab (in 2020 )   4) HH:  has been  taking colostrum daily ,  has not required phlebotomy since starting it.   Outpatient Medications  Prior to Visit  Medication Sig Dispense Refill   aspirin EC 81 MG tablet Take 1 tablet (81 mg total) by mouth daily. Swallow whole.     clopidogrel (PLAVIX) 75 MG tablet TAKE 1 TABLET BY MOUTH ONCE DAILY 90 tablet 0   Colostrum 500 MG CAPS Take 500 mg by mouth daily.      Glycerin-Hypromellose-PEG 400 (DRY EYE RELIEF DROPS) 0.2-0.2-1 % SOLN Place 1 drop into both eyes daily as needed (Dry eye).     levothyroxine (SYNTHROID) 50 MCG tablet TAKE 1 TABLET BY MOUTH ONCE DAILY ON AN EMPTY STOMACH. WAIT 30 MINUTES BEFORE TAKING OTHER MEDS. 90 tablet 1   mirtazapine (REMERON) 15 MG tablet TAKE 1 TABLET BY MOUTH AT BEDTIME 90 tablet 1   Multiple Vitamin (MULTIVITAMIN WITH MINERALS) TABS tablet Take 1 tablet by mouth daily.     propranolol (INDERAL) 10 MG tablet TAKE 1 TABLET BY MOUTH 3 TIMES DAILY AS NEEDED FOR TREMOR 90 tablet 3   rosuvastatin (CRESTOR) 40 MG tablet TAKE 1 TABLET BY MOUTH ONCE EVERY EVENING 90 tablet 0   varenicline (CHANTIX CONTINUING MONTH PAK) 1 MG tablet Take 1 tablet (1 mg total) by mouth 2 (two) times daily. 60 tablet 2   varenicline (CHANTIX STARTING MONTH PAK) 0.5 MG X 11 & 1 MG X 42 tablet Take one 0.5 mg tablet by mouth once daily for 3 days, then increase to one 0.5 mg tablet twice daily for 4 days, then increase to one 1 mg  tablet twice daily. 53 tablet 0   naloxone (NARCAN) nasal spray 4 mg/0.1 mL as directed.     No facility-administered medications prior to visit.    Review of Systems;  Patient denies headache, fevers, malaise, unintentional weight loss, skin rash, eye pain, sinus congestion and sinus pain, sore throat, dysphagia,  hemoptysis , cough, dyspnea, wheezing, chest pain, palpitations, orthopnea, edema, abdominal pain, nausea, melena, diarrhea, constipation, flank pain, dysuria, hematuria, urinary  Frequency, nocturia, numbness, tingling, seizures,  Focal weakness, Loss of consciousness,  Tremor, insomnia, depression, anxiety, and suicidal ideation.       Objective:  BP 138/72 (BP Location: Left Arm, Patient Position: Sitting, Cuff Size: Normal)   Pulse 72   Temp 98.4 F (36.9 C) (Oral)   Ht 5' 7.5" (1.715 m)   Wt 120 lb 6.4 oz (54.6 kg)   SpO2 97%   BMI 18.58 kg/m   BP Readings from Last 3 Encounters:  07/12/22 138/72  02/13/22 110/70  01/10/22 122/64    Wt Readings from Last 3 Encounters:  07/12/22 120 lb 6.4 oz (54.6 kg)  02/20/22 125 lb (56.7 kg)  02/13/22 125 lb 4 oz (56.8 kg)    General appearance: alert, cooperative and appears stated age Ears: normal TM's and external ear canals both ears Throat: lips, mucosa, and tongue normal; teeth and gums normal Neck: no adenopathy, no carotid bruit, supple, symmetrical, trachea midline and thyroid not enlarged, symmetric, no tenderness/mass/nodules Back: symmetric, no curvature. ROM normal. No CVA tenderness. Lungs: clear to auscultation bilaterally Heart: regular rate and rhythm, S1, S2 normal, no murmur, click, rub or gallop Abdomen: soft, non-tender; bowel sounds normal; no masses,  no organomegaly Pulses: 2+ and symmetric Skin: Skin color, texture, turgor normal. No rashes or lesions Lymph nodes: Cervical, supraclavicular, and axillary nodes normal.  Lab Results  Component Value Date   HGBA1C 6.4 07/12/2022   HGBA1C 4.2 (L) 12/22/2018    Lab Results  Component Value Date   CREATININE 0.86 07/12/2022   CREATININE 0.81 12/07/2021   CREATININE 0.82 08/03/2021    Lab Results  Component Value Date   WBC 15.9 (H) 07/12/2022   HGB 16.1 (H) 07/12/2022   HCT 48.0 (H) 07/12/2022   PLT 233.0 07/12/2022   GLUCOSE 99 07/12/2022   CHOL 114 07/12/2022   TRIG 83.0 07/12/2022   HDL 47.10 07/12/2022   LDLDIRECT 57.0 07/12/2022   LDLCALC 50 07/12/2022   ALT 13 07/12/2022   AST 19 07/12/2022   NA 140 07/12/2022   K 4.5 07/12/2022   CL 103 07/12/2022   CREATININE 0.86 07/12/2022   BUN 9 07/12/2022   CO2 28 07/12/2022   TSH 5.20 07/12/2022   INR 1.0 11/05/2016    HGBA1C 6.4 07/12/2022   MICROALBUR 0.6 01/11/2014    MM 3D SCREEN BREAST BILATERAL  Result Date: 09/15/2021 CLINICAL DATA:  Screening. EXAM: DIGITAL SCREENING BILATERAL MAMMOGRAM WITH TOMOSYNTHESIS AND CAD TECHNIQUE: Bilateral screening digital craniocaudal and mediolateral oblique mammograms were obtained. Bilateral screening digital breast tomosynthesis was performed. The images were evaluated with computer-aided detection. COMPARISON:  Previous exam(s). ACR Breast Density Category c: The breast tissue is heterogeneously dense, which may obscure small masses. FINDINGS: There are no findings suspicious for malignancy. IMPRESSION: No mammographic evidence of malignancy. A result letter of this screening mammogram will be mailed directly to the patient. RECOMMENDATION: Screening mammogram in one year. (Code:SM-B-01Y) BI-RADS CATEGORY  1: Negative. Electronically Signed   By: Lajean Manes M.D.   On: 09/15/2021 15:59  Assessment & Plan:   Problem List Items Addressed This Visit     Alcoholic fatty liver   Relevant Orders   Comp Met (CMET) (Completed)   Episode of recurrent major depressive disorder, unspecified depression episode severity ([facility])    Adding Lexapro to help manage her current symptoms       Relevant Medications   escitalopram (LEXAPRO) 10 MG tablet   GAD (generalized anxiety disorder)    Adding lexapro       Relevant Medications   escitalopram (LEXAPRO) 10 MG tablet   Hereditary hemochromatosis ([place])    Confirmed with testing.  hgb has  Increased despite smoking cessation and use of colustrum .phlebotomy advised   Lab Results  Component Value Date   WBC 15.9 (H) 07/12/2022   HGB 16.1 (H) 07/12/2022   HCT 48.0 (H) 07/12/2022   MCV 99.4 07/12/2022   PLT 233.0 07/12/2022   Lab Results  Component Value Date   IRON 177 (H) 07/12/2022   TIBC 420.0 07/12/2022   FERRITIN 34.5 07/12/2022         Relevant Orders   IBC + Ferritin (Completed)   Hyperlipidemia -  Primary   Relevant Orders   Lipid Profile (Completed)   Direct LDL (Completed)   Iatrogenic hypothyroidism    Thyroid function  Is borderline underactive  on current dose.  Given her recent weight loss and tremor, no changes will be made.   Lab Results  Component Value Date   TSH 5.20 07/12/2022         Relevant Orders   TSH (Completed)   Peripheral vascular disease, secondary ([place])   Other Visit Diagnoses     Impaired fasting glucose       Relevant Orders   HgB A1c (Completed)   Long-term use of high-risk medication       Relevant Orders   CBC with Differential/Platelet (Completed)   Pulmonary nodule       Relevant Orders   AMB  Referral to Pulmonary Nodule Clinic   Breast cancer screening by mammogram       Relevant Orders   MM 3D SCREEN BREAST BILATERAL       I spent a total of  32 minutes with this patient in a face to face visit on the date of this encounter reviewing the last office visit with me , his  most recent with patient's cardiologist in March     ,  patient'ss diet and eating habits, home blood pressure readings ,  most recent imaging study ,   and post visit ordering of testing and therapeutics.    Follow-up: Return in about 6 months (around 01/12/2023).   [name], MD

## 2022-07-14 NOTE — Assessment & Plan Note (Signed)
Adding lexapro

## 2022-07-14 NOTE — Assessment & Plan Note (Addendum)
Confirmed with testing.  hgb has  Increased despite smoking cessation and use of colustrum .phlebotomy advised   Lab Results  Component Value Date   WBC 15.9 (H) 07/12/2022   HGB 16.1 (H) 07/12/2022   HCT 48.0 (H) 07/12/2022   MCV 99.4 07/12/2022   PLT 233.0 07/12/2022   Lab Results  Component Value Date   IRON 177 (H) 07/12/2022   TIBC 420.0 07/12/2022   FERRITIN 34.5 07/12/2022

## 2022-07-14 NOTE — Assessment & Plan Note (Signed)
Thyroid function  Is borderline underactive  on current dose.  Given her recent weight loss and tremor, no changes will be made.   Lab Results  Component Value Date   TSH 5.20 07/12/2022

## 2022-07-14 NOTE — Assessment & Plan Note (Signed)
Adding Lexapro to help manage her current symptoms

## 2022-08-10 ENCOUNTER — Encounter: Payer: Self-pay | Admitting: Internal Medicine

## 2022-08-17 ENCOUNTER — Other Ambulatory Visit: Payer: Self-pay

## 2022-08-17 DIAGNOSIS — Z122 Encounter for screening for malignant neoplasm of respiratory organs: Secondary | ICD-10-CM

## 2022-08-17 DIAGNOSIS — F17210 Nicotine dependence, cigarettes, uncomplicated: Secondary | ICD-10-CM

## 2022-08-17 DIAGNOSIS — Z87891 Personal history of nicotine dependence: Secondary | ICD-10-CM

## 2022-08-22 ENCOUNTER — Other Ambulatory Visit: Payer: Self-pay | Admitting: Internal Medicine

## 2022-08-22 DIAGNOSIS — H524 Presbyopia: Secondary | ICD-10-CM | POA: Diagnosis not present

## 2022-08-22 DIAGNOSIS — H2513 Age-related nuclear cataract, bilateral: Secondary | ICD-10-CM | POA: Diagnosis not present

## 2022-08-24 ENCOUNTER — Other Ambulatory Visit: Payer: Self-pay | Admitting: Cardiovascular Disease

## 2022-08-26 ENCOUNTER — Encounter: Payer: Self-pay | Admitting: *Deleted

## 2022-08-26 NOTE — Telephone Encounter (Signed)
$  40 copay due; Auth required Pt due on or after 09/15/22 Sent mychart message to schedule  Auth# [id number] Eff: 02/24/22-11/25/22

## 2022-08-28 ENCOUNTER — Encounter: Payer: Self-pay | Admitting: *Deleted

## 2022-08-28 NOTE — Telephone Encounter (Signed)
Tried to reach patient by voicemail. Voicemail full.  Mailed letter

## 2022-09-11 NOTE — Telephone Encounter (Signed)
Appt scheduled for [facility] on 09/17/22

## 2022-09-12 ENCOUNTER — Telehealth: Payer: Medicare PPO | Admitting: Internal Medicine

## 2022-09-12 ENCOUNTER — Encounter: Payer: Self-pay | Admitting: Internal Medicine

## 2022-09-12 VITALS — BP 118/70 | HR 53 | Ht 67.5 in | Wt 120.6 lb

## 2022-09-12 DIAGNOSIS — E278 Other specified disorders of adrenal gland: Secondary | ICD-10-CM | POA: Diagnosis not present

## 2022-09-12 DIAGNOSIS — E890 Postprocedural hypothyroidism: Secondary | ICD-10-CM | POA: Diagnosis not present

## 2022-09-12 LAB — TSH: TSH: 4.59 u[IU]/mL (ref 0.35–5.50)

## 2022-09-12 LAB — T4, FREE: Free T4: 0.89 ng/dL (ref 0.60–1.60)

## 2022-09-12 NOTE — Patient Instructions (Addendum)
Please continue Levothyroxine 50 mcg daily.  Take the thyroid hormone every day, with water, at least 30 minutes before breakfast, separated by at least 4 hours from: - acid reflux medications - calcium - iron - multivitamins  Please stop at the lab.  Please come back for a follow-up appointment in 1 year but with labs in 6 months.

## 2022-09-12 NOTE — Progress Notes (Unsigned)
Patient ID: Tracie Morris, female   DOB: Apr 20, 1956, 66 y.o.   MRN: [id number]  HPI  Tracie Morris is a 66 y.o.-year-old female, presenting for f/u for adrenal incidentaloma and also postsurgical hypothyroidism. Last visit 1 year ago.  Interim history: No increased tremors, no palpitations, no unintentional weight loss. Before last visit, she started on Remeron for increased depression but also for increasing appetite.  This also helps with sleep. She gained few pounds since last visit but lost them.  Her lowest weight was 98 pounds when she was in alcohol rehab in [place].  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  She is proud of how far she had come since then.  She is very active at work.  Reviewed history: Pt's adrenal mass was incidentally found during investigation for kidney stones in 01/2015.  Abdominal CT from 06/2015: Left adrenal nodule of 9 mm measuring -11 HU.  PET scan on 08/02/2015 and this nodule was not hypermetabolic.  Chest CT (05/21/2016): I have discussed with the radiologist that read the original CT scan (Dr. Ardeen Garland) >> her adrenal adenoma appears stable compared with a CT scan obtained in 2016.  Chest CT (02/13/2018): There is a stable adenoma in the left adrenal measuring 1.3 x 1.2 cm.  No h/o hypokalemia, HTN, hyperglycemia, spells of HA + HTN. She does have a h/o migraines, relieved by prn Plavix (!).   Reviewed previous investigations: urine metanephrines, normetanephrines and catecholamines were normal.  However, at last check, normetanephrine and total metanephrines were slightly elevated.  This is a nonspecific finding so we did not repeat the labs at that time.    Plasma renin activity and aldosterone levels have been normal  Dexamethasone suppression tests were normal.  Reviewed previous pertinent labs:  She had mild elevation in metanephrines and normetanephrines, which is nonspecific:  Component     Latest Ref Rng & Units 02/08/2020  Epinephrine      pg/mL 42  Norepinephrine     pg/mL 744  Dopamine     pg/mL 17  Total Catecholamines     pg/mL 803  ALDOSTERONE     ** ng/dL 5  Renin Activity     0.25 - 5.82 ng/mL/h 0.21 (L)  ALDO / PRA Ratio     0.9 - 28.9 Ratio 23.8  Metanephrine, Pl     <=57 pg/mL 65 (H)  Normetanephrine, Pl     <=148 pg/mL 150 (H)  Total Metanephrines-Plasma     <=205 pg/mL 215 (H)  Potassium     3.5 - 5.3 mmol/L 3.5   Component     Latest Ref Rng & Units 02/24/2018  Epinephrine     pg/mL 31  Norepinephrine     pg/mL 697  Dopamine     pg/mL 30  Catecholamines, Total     pg/mL 728  Metanephrine, Pl     <=57 pg/mL <25  Normetanephrine, Pl     <=148 pg/mL 197 (H)  Total Metanephrines-Plasma     <=205 pg/mL 197  ALDOSTERONE      ng/dL 5  Renin Activity     0.25 - 5.82 ng/mL/h 0.44  ALDO / PRA Ratio     0.9 - 28.9 Ratio 11.4  Potassium     3.5 - 5.1 mEq/L 3.5   Component     Latest Ref Rng & Units 06/09/2018  Cortisol - AM     mcg/dL 2.6 (L)   Component     Latest Ref Rng & Units 02/18/2017  Epinephrine      <LLN  Norepinephrine     pg/mL 558  Dopamine      <LLN  Catecholamines, Total     pg/mL 558  PRA LC/MS/MS     0.25 - 5.82 ng/mL/h 0.33  ALDO / PRA Ratio     0.9 - 28.9 Ratio 9.1  ALDOSTERONE     ng/dL 3  Metanephrine, Pl     <=57 pg/mL 46  Normetanephrine, Pl     <=148 pg/mL 170 (H)  Total Metanephrines-Plasma     <=205 pg/mL 216 (H)   Component     Latest Ref Rng & Units 06/04/2017  Cortisol - AM     mcg/dL 2.9 (L)   Component     Latest Ref Rng 08/23/2015 08/31/2015 01/16/2016 02/06/2016  Total Volume - CF 24Hr U         1400  Epinephrine, 24 hr Urine         REPORT  Norepinephrine, 24 hr Ur     15 - 100 mcg/24 h    19  Calculated Total (E+NE)     26 - 121 mcg/24 h    19 (L)  Dopamine, 24 hr Urine     52 - 480 mcg/24 h    116  Creatinine, Urine mg/day-CATEUR     0.63 - 2.50 g/24 h    0.65  Epinephrine      REPORT     Norepinephrine      534      Dopamine      REPORT     Catecholamines, Total      534     PRA LC/MS/MS     0.25 - 5.82 ng/mL/h 0.47  1.63   ALDO / PRA Ratio     0.9 - 28.9 Ratio 6.4  6.7   ALDOSTERONE      3  11   Metanephrine, Pl     <=57 pg/mL 38     Normetanephrine, Pl     <=148 pg/mL 151 (H)     Total Metanephrines-Plasma     <=205 pg/mL 189     Metanephrines, Ur     90 - 315 mcg/24 h    51 (L)  Normetanephr.,U,24h     122 - 676 mcg/24 h    128  Metanephrine, Ur     224 - 832 mcg/24 h    179 (L)  Potassium     3.5 - 5.1 mEq/L 4.0     Cortisol, Plasma       1.2 9.0    Postsurgical hypothyroidism: Patient had total thyroidectomy in 2007 for presumed thyroid cancer - Dr Carlis Abbott (ENT); 1 parathyroid removed. Final pathology after thyroidectomy >> benign.   Pt is on levothyroxine 50 mcg daily  (dose decreased by PCP 06/2022): - in am (4-6 am) - fasting - at least 30 min from b'fast - no Ca, Fe, PPIs - we moved MVIs later in the day  - not on Biotin anymore  Her TFTs are fluctuating: Lab Results  Component Value Date   TSH 5.20 07/12/2022   TSH 4.20 12/07/2021   TSH 0.23 (L) 09/07/2021   TSH 0.46 08/03/2021   TSH 0.31 (L) 05/26/2021   TSH 0.03 (L) 03/08/2021   FREET4 1.04 09/07/2021   FREET4 1.18 05/26/2021   FREET4 1.48 03/08/2021   FREET4 1.30 05/20/2020   FREET4 1.44 02/08/2020   FREET4 0.77 02/23/2019   Pt denies: - feeling nodules in neck -  hoarseness - dysphagia - choking  She has hereditary hemochromatosis. She has increased LFTs. She is on propranolol for hand tremors >> helped significantly. OTW, she was about to have acranial procedure against it in case this did not work for her. She had hiatal hernia repair (Nissen procedure). Reviewed HbA1c: Lab Results  Component Value Date   HGBA1C 6.4 07/12/2022    ROS: + See HPI + Insomnia - after stopping Xanax -now better with Remeron, resolved hand tremors - on propranolol  I reviewed pt's medications, allergies, PMH, social  hx, family hx, and changes were documented in the history of present illness. Otherwise, unchanged from my initial visit note.  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 Repeat angiography in November of 2014 showed 50% distal aortic stenosis extending into the left common iliac artery which had 60-70% ostial stenosis. No other significant disease. 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   R nasal tip/in situ, txt'd  with [facility]    Past Surgical History:  Procedure Laterality Date   ABDOMINAL AORTAGRAM N/A 10/14/2013   Procedure: ABDOMINAL [name];  Surgeon: Wellington Hampshire, MD;  Location: [facility];  Service: Cardiovascular;  Laterality: N/A;   ABDOMINAL AORTOGRAM W/LOWER EXTREMITY N/A 10/05/2020   Procedure: ABDOMINAL AORTOGRAM W/LOWER EXTREMITY;  Surgeon: Wellington Hampshire, MD;  Location: [facility];  Service: Cardiovascular;  Laterality: N/A;   ANGIOPLASTY ILLIAC ARTERY     CESAREAN SECTION     COLONOSCOPY WITH PROPOFOL N/A 09/22/2020   Procedure: COLONOSCOPY WITH PROPOFOL;  Surgeon: Lin Landsman, MD;  Location: [facility];  Service: Endoscopy;  Laterality: N/A;   NOSE SURGERY     PERIPHERAL VASCULAR  CATHETERIZATION N/A 11/14/2016   Procedure: Abdominal Aortogram;  Surgeon: Wellington Hampshire, MD;  Location: [facility];  Service: Cardiovascular;  Laterality: N/A;   PERIPHERAL VASCULAR CATHETERIZATION Bilateral 11/14/2016   Procedure: Lower Extremity Angiography;  Surgeon: Wellington Hampshire, MD;  Location: [facility];  Service: Cardiovascular;  Laterality: Bilateral;  limited iliacs only   PERIPHERAL VASCULAR CATHETERIZATION Left 11/14/2016   Procedure: Peripheral Vascular Intervention;  Surgeon: Wellington Hampshire, MD;  Location: [facility];  Service: Cardiovascular;  Laterality: Left;  common iliac   PERIPHERAL VASCULAR INTERVENTION  10/05/2020   Procedure: PERIPHERAL VASCULAR INTERVENTION;  Surgeon: Wellington Hampshire, MD;  Location: [facility];  Service: Cardiovascular;;  left common iliac    PERIPHERAL VASCULAR THROMBECTOMY  10/05/2020   Procedure: PERIPHERAL VASCULAR THROMBECTOMY;  Surgeon: Wellington Hampshire, MD;  Location: [facility];  Service: Cardiovascular;;  Mechanical - Left common  iliac, left internal iliac, and left SFA   POLYPECTOMY  09/22/2020   Procedure: POLYPECTOMY;  Surgeon: Lin Landsman, MD;  Location: [facility];  Service: Endoscopy;;   SKIN CANCER EXCISION     THYROID SURGERY  2007   secondary to thyroid cancer   Social History   Social History   Marital Status: Married    Spouse Name: N/A   Number of Children: 1   Occupational History   Freight forwarder    Social History Main Topics   Smoking status: Former Smoker -- 1.00 packs/day for 20 years    Types: Cigarettes    Quit date: 10/28/2011   Smokeless tobacco: Never Used   Alcohol Use: No   Drug Use: No   Current Outpatient Medications on File Prior to Visit  Medication Sig Dispense Refill   aspirin EC 81 MG tablet Take 1 tablet (81 mg total) by mouth daily. Swallow whole.     clopidogrel (PLAVIX) 75 MG tablet TAKE 1 TABLET BY MOUTH ONCE DAILY 90 tablet 0    Colostrum 500 MG CAPS Take 500 mg by mouth daily.      escitalopram (LEXAPRO) 10 MG tablet Take 1 tablet (10 mg total) by mouth daily. 30 tablet 5   Glycerin-Hypromellose-PEG 400 (DRY EYE RELIEF DROPS) 0.2-0.2-1 % SOLN Place 1 drop into both eyes daily as needed (Dry eye).     levothyroxine (SYNTHROID) 50 MCG tablet TAKE 1 TABLET BY MOUTH ONCE DAILY ON AN EMPTY STOMACH. WAIT 30 MINUTES BEFORE TAKING OTHER MEDS. 90 tablet 1   mirtazapine (REMERON) 15 MG tablet TAKE 1 TABLET BY MOUTH AT BEDTIME 90 tablet 1   Multiple Vitamin (MULTIVITAMIN WITH MINERALS) TABS tablet Take 1 tablet by mouth daily.     naloxone (NARCAN) nasal spray 4 mg/0.1 mL as directed.     propranolol (INDERAL) 10 MG tablet TAKE 1 TABLET BY MOUTH 3 TIMES DAILY AS NEEDED FOR TREMOR 90 tablet 3   rosuvastatin (CRESTOR) 40 MG tablet TAKE 1 TABLET BY MOUTH ONCE EVERY EVENING 90 tablet 3   varenicline (CHANTIX CONTINUING MONTH PAK) 1 MG tablet Take 1 tablet (1 mg total) by mouth 2 (two) times daily. 60 tablet 2   varenicline (CHANTIX STARTING MONTH PAK) 0.5 MG X 11 & 1 MG X 42 tablet Take one 0.5 mg tablet by mouth once daily for 3 days, then increase to one 0.5 mg tablet twice daily for 4 days, then increase to one 1 mg tablet twice daily. 53 tablet 0   No current facility-administered medications on file prior to visit.   Allergies  Allergen Reactions   Atorvastatin Other (See Comments)    Myalgias    Darvocet [Propoxyphene N-Acetaminophen] Nausea And Vomiting   Demerol Nausea And Vomiting   Methocarbamol Other (See Comments)    UNKNOWN   Mirtazapine     Sleeplessness, nausea    Primidone     Severe insomnia, myoclonus and parathesias    Ambien [Zolpidem Tartrate] Palpitations    Anxiety , trembling.   Family History  Problem Relation Age of Onset   Alzheimer's disease Father    Stroke Mother    Coronary artery disease Maternal Grandmother    Heart disease Maternal Grandmother 49   Coronary artery disease Paternal  Grandmother 65   Heart disease Paternal Grandmother 54   Breast cancer Neg Hx    PE: BP 118/70 (BP Location: Left Arm, Patient Position: Sitting, Cuff Size: Normal)   Pulse (!) 53   Ht  5' 7.5" (1.715 m)   Wt 120 lb 9.6 oz (54.7 kg)   SpO2 99%   BMI 18.61 kg/m    Wt Readings from Last 3 Encounters:  09/12/22 120 lb 9.6 oz (54.7 kg)  07/12/22 120 lb 6.4 oz (54.6 kg)  02/20/22 125 lb (56.7 kg)   Constitutional: normal weight, in NAD Eyes:  EOMI, no exophthalmos ENT: no neck masses, no cervical lymphadenopathy Cardiovascular: RRR, No MRG Respiratory: CTA B Musculoskeletal: no deformities Skin:no rashes Neurological: no tremor with outstretched hands  ASSESSMENT: 1. Adrenal incidentaloma  2. Postsurgical hypothyroidism  3. Presumed ThyCA I received the report of her thyroid pathology from [facility], from 02/06/2006: - Total thyroidectomy: A. Right thyroid: Nodular hyperplasia B. Left thyroid: Nodular hyperplasia with 1.9 cm dominant adenomatoid nodule. Parathyroid tissue in a single section, measuring 0.7 mm. Conclusion: There is nodular hyperplasia (multinodular goiter). There is no evidence of malignancy.   PLAN:  1. Patient with a 1 cm left adrenal nodule discovered incidentally during investigation for kidney stones -Previous hormonal work-up and imaging indicated a benign, nonsecreting, adrenal nodule -She has slightly elevated metanephrines and normetanephrine's in the past, which is nonspecific and not indicative of pheochromocytoma.  The dexamethasone suppression test was normal, ruling out Cushing syndrome.  Aldosterone and plasma renin activity were also normal, ruling out primary hyperaldosteronism -no new HTN, DM2, weight gain -Adrenal hormones were stable over 6 years, so further investigation is not needed for this  2. Hypothyroidism -Patient with history of uncontrolled hypothyroidism, fluctuating TFTs - latest thyroid labs reviewed with  pt. >> normal: Lab Results  Component Value Date   TSH 5.20 07/12/2022  - she was on LT4 75 mcg 6/7 days and 37.5 mcg 1/7 days mcg daily (daily average of 64 mcg daily) - changed at last OV - since then, in 06/2022, her LT4 was decreased to 50 mcg by PCP - she continues on this dose - she mentions feeling well, with only some fatigue - we discussed about taking the thyroid hormone every day, with water, >30 minutes before breakfast, separated by >4 hours from acid reflux medications, calcium, iron, multivitamins. Pt. is taking it correctly. - will check thyroid tests today: TSH and fT4 - If labs are abnormal, she will need to return for repeat TFTs in 1.5 months  Component     Latest Ref Rng 09/12/2022  TSH     0.35 - 5.50 uIU/mL 4.59   T4,Free(Direct)     0.60 - 1.60 ng/dL 0.89    TSH high in the target range, but still normal.  For now, especially due to her preference not to cut pills in half and to avoid thyrotoxicosis, we will continue the current dose of levothyroxine.  Philemon Kingdom, MD PhD [facility] Endocrinology

## 2022-09-13 ENCOUNTER — Ambulatory Visit
Admission: RE | Admit: 2022-09-13 | Discharge: 2022-09-13 | Disposition: A | Discharge status: Home / Self Care | Payer: Medicare PPO | Source: Ambulatory Visit | Attending: Internal Medicine | Admitting: Internal Medicine

## 2022-09-13 DIAGNOSIS — Z1231 Encounter for screening mammogram for malignant neoplasm of breast: Secondary | ICD-10-CM | POA: Diagnosis not present

## 2022-09-17 ENCOUNTER — Ambulatory Visit (INDEPENDENT_AMBULATORY_CARE_PROVIDER_SITE_OTHER): Payer: Medicare PPO

## 2022-09-17 DIAGNOSIS — M810 Age-related osteoporosis without current pathological fracture: Secondary | ICD-10-CM

## 2022-09-17 MED ORDER — DENOSUMAB 60 MG/ML ~~LOC~~ SOSY
60.0000 mg | PREFILLED_SYRINGE | Freq: Once | SUBCUTANEOUS | Status: AC
  Administered 2022-09-17: 60 mg via SUBCUTANEOUS

## 2022-09-17 NOTE — Progress Notes (Signed)
Patient came in today for Prolia injection given in left arm SQ. Patient tolerated well with no signs of distress.  °

## 2022-09-18 ENCOUNTER — Ambulatory Visit (INDEPENDENT_AMBULATORY_CARE_PROVIDER_SITE_OTHER): Payer: Medicare PPO | Admitting: Acute Care

## 2022-09-18 ENCOUNTER — Encounter: Payer: Self-pay | Admitting: Acute Care

## 2022-09-18 DIAGNOSIS — F17210 Nicotine dependence, cigarettes, uncomplicated: Secondary | ICD-10-CM

## 2022-09-18 NOTE — Progress Notes (Signed)
Virtual Visit via Telephone Note  I connected with Tracie Morris on 09/18/22 at  2:30 PM EDT by telephone and verified that I am speaking with the correct person using two identifiers.  Location: Patient:  At home Provider:  [address], [place], [place], [address]    I discussed the limitations, risks, security and privacy concerns of performing an evaluation and management service by telephone and the availability of in person appointments. I also discussed with the patient that there may be a patient responsible charge related to this service. The patient expressed understanding and agreed to proceed.  Shared Decision Making Visit Lung Cancer Screening Program [phone])   Eligibility: Age 66 y.o. Pack Years Smoking History Calculation 46 pack year smoking history (# packs/per year x # years smoked) Recent History of coughing up blood  no Unexplained weight loss? no ( >Than 15 pounds within the last 6 months ) Prior History Lung / other cancer no (Diagnosis within the last 5 years already requiring surveillance chest CT Scans). Smoking Status Current Smoker Former Smokers: Years since quit:  NA  Quit Date:  NA  Visit Components: Discussion included one or more decision making aids. yes Discussion included risk/benefits of screening. yes Discussion included potential follow up diagnostic testing for abnormal scans. yes Discussion included meaning and risk of over diagnosis. yes Discussion included meaning and risk of False Positives. yes Discussion included meaning of total radiation exposure. yes  Counseling Included: Importance of adherence to annual lung cancer LDCT screening. yes Impact of comorbidities on ability to participate in the program. yes Ability and willingness to under diagnostic treatment. yes  Smoking Cessation Counseling: Current Smokers:  Discussed importance of smoking cessation. yes Information about tobacco cessation classes and  interventions provided to patient. yes Patient provided with "ticket" for LDCT Scan. yes Symptomatic Patient. no  Counseling NA Diagnosis Code: Tobacco Use Z72.0 Asymptomatic Patient yes  Counseling (Intermediate counseling: > three minutes counseling) [id number] Former Smokers:  Discussed the importance of maintaining cigarette abstinence. yes Diagnosis Code: Personal History of Nicotine Dependence. W10.272 Information about tobacco cessation classes and interventions provided to patient. Yes Patient provided with "ticket" for LDCT Scan. yes Written Order for Lung Cancer Screening with LDCT placed in Epic. Yes (CT Chest Lung Cancer Screening Low Dose W/O CM) [id number] Z12.2-Screening of respiratory organs Z87.891-Personal history of nicotine dependence  I have spent 25 minutes of face to face/ virtual visit   time with  Tracie Morris discussing the risks and benefits of lung cancer screening. We viewed / discussed a power point together that explained in detail the above noted topics. We paused at intervals to allow for questions to be asked and answered to ensure understanding.We discussed that the single most powerful action that she can take to decrease her risk of developing lung cancer is to quit smoking. We discussed whether or not she is ready to commit to setting a quit date. We discussed options for tools to aid in quitting smoking including nicotine replacement therapy, non-nicotine medications, support groups, Quit Smart classes, and behavior modification. We discussed that often times setting smaller, more achievable goals, such as eliminating 1 cigarette a day for a week and then 2 cigarettes a day for a week can be helpful in slowly decreasing the number of cigarettes smoked. This allows for a sense of accomplishment as well as providing a clinical benefit. I provided  her  with smoking cessation  information  with contact information for community resources, classes,  free nicotine replacement  therapy, and access to mobile apps, text messaging, and on-line smoking cessation help. I have also provided  her  the office contact information in the event she needs to contact me, or the screening staff. We discussed the time and location of the scan, and that either Doroteo Glassman RN, Joella Prince, RN  or I will call / send a letter with the results within 24-72 hours of receiving them. The patient verbalized understanding of all of  the above and had no further questions upon leaving the office. They have my contact information in the event they have any further questions.  I spent 3 minutes counseling on smoking cessation and the health risks of continued tobacco abuse.  I explained to the patient that there has been a high incidence of coronary artery disease noted on these exams. I explained that this is a non-gated exam therefore degree or severity cannot be determined. This patient is on statin therapy. I have asked the patient to follow-up with their PCP regarding any incidental finding of coronary artery disease and management with diet or medication as their PCP  feels is clinically indicated. The patient verbalized understanding of the above and had no further questions upon completion of the visit.      Magdalen Spatz, NP 09/18/2022

## 2022-09-18 NOTE — Patient Instructions (Signed)
Thank you for participating in the [facility] Scre[facility]. It was our pleasure to meet you today. We will call you with the results of your scan within the next few days. Your scan will be assigned a Lung RADS category score by the physicians reading the scans.  This Lung RADS score determines follow up scanning.  See below for description of categories, and follow up screening recommendations. We will be in touch to schedule your follow up screening annually or based on recommendations of our providers. We will fax a copy of your scan results to your Primary Care Physician, or the physician who referred you to the program, to ensure they have the results. Please call the office if you have any questions or concerns regarding your scanning experience or results.  Our office number is [phone]. Please speak with Denise Phelps, RN. , or  Denise Buckner RN, They are  our Lung Cancer Screening RN.'s If They are unavailable when you call, Please leave a message on the voice mail. We will return your call at our earliest convenience.This voice mail is monitored several times a day.  Remember, if your scan is normal, we will scan you annually as long as you continue to meet the criteria for the program. (Age 55-77, Current smoker or smoker who has quit within the last 15 years). If you are a smoker, remember, quitting is the single most powerful action that you can take to decrease your risk of lung cancer and other pulmonary, breathing related problems. We know quitting is hard, and we are here to help.  Please let us know if there is anything we can do to help you meet your goal of quitting. If you are a former smoker, congratulations. We are proud of you! Remain smoke free! Remember you can refer friends or family members through the number above.  We will screen them to make sure they meet criteria for the program. Thank you for helping us take better care of you by  participating in Lung Screening.  You can receive free nicotine replacement therapy ( patches, gum or mints) by calling 1-800-QUIT NOW. Please call so we can get you on the path to becoming  a non-smoker. I know it is hard, but you can do this!  Lung RADS Categories:  Lung RADS 1: no nodules or definitely non-concerning nodules.  Recommendation is for a repeat annual scan in 12 months.  Lung RADS 2:  nodules that are non-concerning in appearance and behavior with a very low likelihood of becoming an active cancer. Recommendation is for a repeat annual scan in 12 months.  Lung RADS 3: nodules that are probably non-concerning , includes nodules with a low likelihood of becoming an active cancer.  Recommendation is for a 6-month repeat screening scan. Often noted after an upper respiratory illness. We will be in touch to make sure you have no questions, and to schedule your 6-month scan.  Lung RADS 4 A: nodules with concerning findings, recommendation is most often for a follow up scan in 3 months or additional testing based on our provider's assessment of the scan. We will be in touch to make sure you have no questions and to schedule the recommended 3 month follow up scan.  Lung RADS 4 B:  indicates findings that are concerning. We will be in touch with you to schedule additional diagnostic testing based on our provider's  assessment of the scan.  Other options for assistance in smoking cessation (   As covered by your insurance benefits)  Hypnosis for smoking cessation  [employer] [phone]  Acupuncture for smoking cessation  [facility] [phone]   

## 2022-09-19 ENCOUNTER — Ambulatory Visit
Admission: RE | Admit: 2022-09-19 | Discharge: 2022-09-19 | Disposition: A | Discharge status: Home / Self Care | Payer: Medicare PPO | Source: Ambulatory Visit | Attending: Acute Care | Admitting: Acute Care

## 2022-09-19 DIAGNOSIS — Z87891 Personal history of nicotine dependence: Secondary | ICD-10-CM | POA: Diagnosis not present

## 2022-09-19 DIAGNOSIS — Z122 Encounter for screening for malignant neoplasm of respiratory organs: Secondary | ICD-10-CM | POA: Diagnosis not present

## 2022-09-19 DIAGNOSIS — F17210 Nicotine dependence, cigarettes, uncomplicated: Secondary | ICD-10-CM

## 2022-09-24 ENCOUNTER — Other Ambulatory Visit: Payer: Self-pay | Admitting: Acute Care

## 2022-09-24 DIAGNOSIS — Z87891 Personal history of nicotine dependence: Secondary | ICD-10-CM

## 2022-09-24 DIAGNOSIS — Z122 Encounter for screening for malignant neoplasm of respiratory organs: Secondary | ICD-10-CM

## 2022-09-24 DIAGNOSIS — F17210 Nicotine dependence, cigarettes, uncomplicated: Secondary | ICD-10-CM

## 2022-09-26 DIAGNOSIS — H2512 Age-related nuclear cataract, left eye: Secondary | ICD-10-CM | POA: Diagnosis not present

## 2022-10-02 NOTE — Telephone Encounter (Signed)
Received a second PA# for Prolia

## 2022-10-03 ENCOUNTER — Encounter: Payer: Self-pay | Admitting: Ophthalmology

## 2022-10-04 NOTE — Discharge Instructions (Signed)

## 2022-10-09 ENCOUNTER — Ambulatory Visit: Payer: Medicare PPO | Admitting: Anesthesiology

## 2022-10-09 ENCOUNTER — Other Ambulatory Visit: Payer: Self-pay

## 2022-10-09 ENCOUNTER — Encounter: Payer: Self-pay | Admitting: Ophthalmology

## 2022-10-09 ENCOUNTER — Ambulatory Visit
Admission: RE | Admit: 2022-10-09 | Discharge: 2022-10-09 | Disposition: A | Discharge status: Home / Self Care | Payer: Medicare PPO | Attending: Ophthalmology | Admitting: Ophthalmology

## 2022-10-09 ENCOUNTER — Encounter
Admission: RE | Disposition: A | Discharge status: Home / Self Care | Payer: Self-pay | Source: Home / Self Care | Attending: Ophthalmology

## 2022-10-09 DIAGNOSIS — E785 Hyperlipidemia, unspecified: Secondary | ICD-10-CM | POA: Diagnosis not present

## 2022-10-09 DIAGNOSIS — I745 Embolism and thrombosis of iliac artery: Secondary | ICD-10-CM | POA: Insufficient documentation

## 2022-10-09 DIAGNOSIS — E890 Postprocedural hypothyroidism: Secondary | ICD-10-CM | POA: Insufficient documentation

## 2022-10-09 DIAGNOSIS — Z87891 Personal history of nicotine dependence: Secondary | ICD-10-CM | POA: Insufficient documentation

## 2022-10-09 DIAGNOSIS — Z85828 Personal history of other malignant neoplasm of skin: Secondary | ICD-10-CM | POA: Insufficient documentation

## 2022-10-09 DIAGNOSIS — F17200 Nicotine dependence, unspecified, uncomplicated: Secondary | ICD-10-CM | POA: Diagnosis not present

## 2022-10-09 DIAGNOSIS — I2510 Atherosclerotic heart disease of native coronary artery without angina pectoris: Secondary | ICD-10-CM | POA: Insufficient documentation

## 2022-10-09 DIAGNOSIS — I6529 Occlusion and stenosis of unspecified carotid artery: Secondary | ICD-10-CM | POA: Diagnosis not present

## 2022-10-09 DIAGNOSIS — J449 Chronic obstructive pulmonary disease, unspecified: Secondary | ICD-10-CM | POA: Diagnosis not present

## 2022-10-09 DIAGNOSIS — H2512 Age-related nuclear cataract, left eye: Secondary | ICD-10-CM | POA: Diagnosis not present

## 2022-10-09 DIAGNOSIS — Z85850 Personal history of malignant neoplasm of thyroid: Secondary | ICD-10-CM | POA: Insufficient documentation

## 2022-10-09 DIAGNOSIS — I739 Peripheral vascular disease, unspecified: Secondary | ICD-10-CM | POA: Insufficient documentation

## 2022-10-09 DIAGNOSIS — F418 Other specified anxiety disorders: Secondary | ICD-10-CM | POA: Diagnosis not present

## 2022-10-09 HISTORY — PX: CATARACT EXTRACTION W/PHACO: SHX586

## 2022-10-09 SURGERY — PHACOEMULSIFICATION, CATARACT, WITH IOL INSERTION
Anesthesia: Monitor Anesthesia Care | Site: Eye | Laterality: Left

## 2022-10-09 MED ORDER — FENTANYL CITRATE (PF) 100 MCG/2ML IJ SOLN
INTRAMUSCULAR | Status: DC | PRN
  Administered 2022-10-09: 50 ug via INTRAVENOUS

## 2022-10-09 MED ORDER — BRIMONIDINE TARTRATE-TIMOLOL 0.2-0.5 % OP SOLN
OPHTHALMIC | Status: DC | PRN
  Administered 2022-10-09: 1 [drp] via OPHTHALMIC

## 2022-10-09 MED ORDER — ARMC OPHTHALMIC DILATING DROPS
1.0000 | OPHTHALMIC | Status: AC | PRN
  Administered 2022-10-09 (×3): 1 via OPHTHALMIC

## 2022-10-09 MED ORDER — SIGHTPATH DOSE#1 NA CHONDROIT SULF-NA HYALURON 40-17 MG/ML IO SOLN
INTRAOCULAR | Status: DC | PRN
  Administered 2022-10-09: 1 mL via INTRAOCULAR

## 2022-10-09 MED ORDER — SIGHTPATH DOSE#1 BSS IO SOLN
INTRAOCULAR | Status: DC | PRN
  Administered 2022-10-09: 15 mL via INTRAOCULAR

## 2022-10-09 MED ORDER — MIDAZOLAM HCL 2 MG/2ML IJ SOLN
INTRAMUSCULAR | Status: DC | PRN
  Administered 2022-10-09: 1 mg via INTRAVENOUS

## 2022-10-09 MED ORDER — TETRACAINE HCL 0.5 % OP SOLN
1.0000 [drp] | OPHTHALMIC | Status: AC | PRN
  Administered 2022-10-09 (×3): 1 [drp] via OPHTHALMIC

## 2022-10-09 MED ORDER — LACTATED RINGERS IV SOLN: INTRAVENOUS | Status: DC

## 2022-10-09 MED ORDER — MOXIFLOXACIN HCL 0.5 % OP SOLN
OPHTHALMIC | Status: DC | PRN
  Administered 2022-10-09: .2 mL via OPHTHALMIC

## 2022-10-09 MED ORDER — SIGHTPATH DOSE#1 BSS IO SOLN
INTRAOCULAR | Status: DC | PRN
  Administered 2022-10-09: 45 mL via OPHTHALMIC

## 2022-10-09 MED ORDER — SIGHTPATH DOSE#1 BSS IO SOLN
INTRAOCULAR | Status: DC | PRN
  Administered 2022-10-09: 2 mL

## 2022-10-09 SURGICAL SUPPLY — 17 items
CANNULA ANT/CHMB 27G (MISCELLANEOUS) IMPLANT
CANNULA ANT/CHMB 27GA (MISCELLANEOUS) IMPLANT
CATARACT SUITE SIGHTPATH (MISCELLANEOUS) ×1 IMPLANT
FEE CATARACT SUITE SIGHTPATH (MISCELLANEOUS) ×2 IMPLANT
GLOVE SURG ENC TEXT LTX SZ8 (GLOVE) ×2 IMPLANT
GLOVE SURG TRIUMPH 8.0 PF LTX (GLOVE) ×2 IMPLANT
LENS CLAREON VIVITY 21.5 ×1 IMPLANT
LENS IOL CLRN VT YLW 21.5 IMPLANT
NDL FILTER BLUNT 18X1 1/2 (NEEDLE) ×2 IMPLANT
NEEDLE FILTER BLUNT 18X1 1/2 (NEEDLE) ×1 IMPLANT
PACK VIT ANT 23G (MISCELLANEOUS) IMPLANT
RING MALYGIN (MISCELLANEOUS) IMPLANT
SUT ETHILON 10-0 CS-B-6CS-B-6 (SUTURE)
SUTURE EHLN 10-0 CS-B-6CS-B-6 (SUTURE) IMPLANT
SYR 3ML LL SCALE MARK (SYRINGE) ×2 IMPLANT
TIP ITREPID SGL USE BENT I/A (SUCTIONS) IMPLANT
WATER STERILE IRR 250ML POUR (IV SOLUTION) ×2 IMPLANT

## 2022-10-09 NOTE — H&P (Signed)
[facility]   Primary Care Physician:  [name], MD Ophthalmologist: Dr. Hortense Ramal  Pre-Procedure History & Physical: HPI:  Tracie Morris is a 66 y.o. female here for cataract surgery.   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 Repeat angiography in November of 2014 showed 50% distal aortic stenosis extending into the left common iliac artery which had 60-70% ostial stenosis. No other significant disease. 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   R nasal tip/in situ, txt'd  with [facility]     Past Surgical History:  Procedure Laterality Date   ABDOMINAL AORTAGRAM N/A 10/14/2013   Procedure: ABDOMINAL [name];  Surgeon: Wellington Hampshire, MD;  Location: [place] CATH [facility];  Service: Cardiovascular;  Laterality: N/A;   ABDOMINAL AORTOGRAM W/LOWER EXTREMITY N/A 10/05/2020   Procedure: ABDOMINAL AORTOGRAM W/LOWER EXTREMITY;  Surgeon: Wellington Hampshire, MD;  Location: [place] [facility];  Service: Cardiovascular;  Laterality: N/A;   ANGIOPLASTY ILLIAC ARTERY     CESAREAN SECTION     COLONOSCOPY WITH PROPOFOL N/A 09/22/2020   Procedure: COLONOSCOPY WITH PROPOFOL;  Surgeon: Lin Landsman, MD;  Location: [place];  Service: Endoscopy;  Laterality: N/A;   NOSE SURGERY     PERIPHERAL VASCULAR CATHETERIZATION N/A 11/14/2016   Procedure: Abdominal Aortogram;  Surgeon: Wellington Hampshire, MD;  Location: [place] [facility];  Service: Cardiovascular;  Laterality: N/A;   PERIPHERAL VASCULAR CATHETERIZATION Bilateral 11/14/2016   Procedure: Lower Extremity Angiography;  Surgeon: Wellington Hampshire, MD;  Location: [place] [facility];  Service: Cardiovascular;  Laterality: Bilateral;  limited iliacs only   PERIPHERAL VASCULAR CATHETERIZATION Left 11/14/2016   Procedure: Peripheral Vascular Intervention;  Surgeon: Wellington Hampshire, MD;  Location: [place] [facility];  Service: Cardiovascular;  Laterality: Left;  common iliac   PERIPHERAL VASCULAR INTERVENTION  10/05/2020   Procedure: PERIPHERAL VASCULAR INTERVENTION;  Surgeon: Wellington Hampshire, MD;  Location: [place] [facility];  Service: Cardiovascular;;  left common iliac    PERIPHERAL VASCULAR THROMBECTOMY  10/05/2020   Procedure: PERIPHERAL VASCULAR THROMBECTOMY;  Surgeon: Wellington Hampshire, MD;  Location: [place] [facility];  Service: Cardiovascular;;  Mechanical - Left common iliac, left internal iliac, and left SFA   POLYPECTOMY  09/22/2020   Procedure: POLYPECTOMY;  Surgeon: Lin Landsman, MD;  Location: [place];  Service: Endoscopy;;   SKIN CANCER EXCISION     THYROID SURGERY  2007   secondary to thyroid cancer    Prior to Admission medications   Medication Sig Start Date End Date Taking? Authorizing Provider  B Complex Vitamins (VITAMIN-B COMPLEX PO) Take by mouth  daily.   Yes [provider]  clopidogrel (PLAVIX) 75 MG tablet TAKE 1 TABLET BY MOUTH ONCE DAILY 08/24/22  Yes Wellington Hampshire, MD  Colostrum 500 MG CAPS Take 500 mg by mouth daily.    Yes [provider]  Glycerin-Hypromellose-PEG 400 (DRY EYE RELIEF DROPS) 0.2-0.2-1 % SOLN Place 1 drop into both eyes daily as needed (Dry eye).   Yes [provider]  levothyroxine (SYNTHROID) 50 MCG tablet TAKE 1 TABLET BY MOUTH ONCE DAILY ON AN EMPTY STOMACH. WAIT 30 MINUTES BEFORE TAKING OTHER MEDS. 07/05/22  Yes [name], MD  propranolol (INDERAL) 10 MG tablet TAKE 1 TABLET BY MOUTH 3 TIMES DAILY AS NEEDED FOR TREMOR 05/08/22  Yes [name], MD  rosuvastatin (CRESTOR) 40 MG tablet TAKE 1 TABLET BY MOUTH ONCE EVERY EVENING 08/23/22  Yes [name], MD  VITAMIN D PO Take by mouth daily.   Yes [provider]  escitalopram (LEXAPRO) 10 MG tablet Take 1 tablet (10 mg total) by mouth daily. Patient not taking: Reported on 10/03/2022 07/12/22   [name], MD  mirtazapine (REMERON) 15 MG tablet TAKE 1 TABLET BY MOUTH AT BEDTIME Patient not taking: Reported on 10/03/2022 06/15/22   [name], MD  naloxone [facility]) nasal spray 4 mg/0.1 mL as directed. Patient not taking: Reported on 10/03/2022 04/30/22   [provider]  varenicline (CHANTIX CONTINUING MONTH PAK) 1 MG tablet Take 1 tablet (1 mg total) by mouth 2 (two) times daily. Patient not taking: Reported on 10/03/2022 10/07/21   [name], MD  varenicline (CHANTIX STARTING MONTH PAK) 0.5 MG X 11 & 1 MG X 42 tablet Take one 0.5 mg tablet by mouth once daily for 3 days, then increase to one 0.5 mg tablet twice daily for 4 days, then increase to one 1 mg tablet twice daily. Patient not taking: Reported on 10/03/2022 09/06/21   [name], MD    Allergies as of 08/24/2022 - Review Complete 07/12/2022  Allergen Reaction Noted   Atorvastatin Other (See Comments) 01/31/2015   Darvocet [propoxyphene n-acetaminophen] Nausea And Vomiting 10/19/2011   Demerol Nausea And Vomiting 10/19/2011   Methocarbamol Other (See Comments) 01/31/2015   Mirtazapine  04/09/2016   Primidone  08/18/2018   Ambien [zolpidem tartrate] Palpitations 01/31/2015    Family History  Problem Relation Age of Onset   Alzheimer's disease Father    Stroke Mother    Coronary  artery disease Maternal Grandmother    Heart disease Maternal Grandmother 57   Coronary artery disease Paternal Grandmother 75   Heart disease Paternal Grandmother 75   Breast cancer Neg Hx     Social History   Socioeconomic History   Marital status: Married    Spouse name: Not on file   Number of children: Not on file   Years of education: Not on file   Highest education level: Not on file  Occupational History   Not on file  Tobacco Use   Smoking status: Every Day    Packs/day: 1.00    Years: 20.00    Total pack years: 20.00    Types: Cigarettes    Last attempt to quit: 10/28/2011    Years since quitting: 10.9   Smokeless tobacco: Never  Vaping Use   Vaping Use: Former  Substance and Sexual Activity   Alcohol use: Not Currently   Drug use: No   Sexual activity: Not on file  Other Topics Concern  Not on file  Social History Narrative   Not on file   Social Determinants of Health   Financial Resource Strain: Low Risk  (02/20/2022)   Overall Financial Resource Strain (CARDIA)    Difficulty of Paying Living Expenses: Not hard at all  Food Insecurity: No Food Insecurity (02/20/2022)   Hunger Vital Sign    Worried About Running Out of Food in the Last Year: Never true    Ran Out of Food in the Last Year: Never true  Transportation Needs: No Transportation Needs (02/20/2022)   PRAPARE - Hydrologist (Medical): No    Lack of Transportation (Non-Medical): No  Physical Activity: Not on file  Stress: No Stress Concern Present (02/20/2022)   [place]    Feeling of Stress : Not at all  Social Connections: Unknown (02/20/2022)   Social Connection and Isolation Panel [NHANES]    Frequency of Communication with Friends and Family: Not on file    Frequency of Social Gatherings with Friends and Family: Not on file    Attends Religious Services: Not on file    Active Member of Clubs or  Organizations: Not on file    Attends Archivist Meetings: Not on file    Marital Status: Married  Intimate Partner Violence: Not At Risk (02/20/2022)   Humiliation, Afraid, Rape, and Kick questionnaire    Fear of Current or Ex-Partner: No    Emotionally Abused: No    Physically Abused: No    Sexually Abused: No    Review of Systems: See HPI, otherwise negative ROS  Physical Exam: BP 116/79   Pulse 64   Temp (!) 97.5 F (36.4 C) (Temporal)   Resp 18   Ht '5\' 8"'$  (1.727 m)   Wt 55.8 kg   SpO2 97%   BMI 18.70 kg/m  General:   Alert, cooperative in NAD Head:  Normocephalic and atraumatic. Respiratory:  Normal work of breathing. Cardiovascular:  RRR  Impression/Plan: Tracie Morris is here for cataract surgery.  Risks, benefits, limitations, and alternatives regarding cataract surgery have been reviewed with the patient.  Questions have been answered.  All parties agreeable.   Birder Robson, MD  10/09/2022, 12:50 PM

## 2022-10-09 NOTE — Op Note (Signed)
PREOPERATIVE DIAGNOSIS:  Nuclear sclerotic cataract of the left eye.   POSTOPERATIVE DIAGNOSIS:  Nuclear sclerotic cataract of the left eye.   OPERATIVE PROCEDURE:ORPROCALL@   SURGEON:  Birder Robson, MD.   ANESTHESIA:  Anesthesiologist: Martha Clan, MD CRNA: Jacqualin Combes, CRNA  1.      Managed anesthesia care. 2.     0.42m of Shugarcaine was instilled following the paracentesis   COMPLICATIONS:  None.   TECHNIQUE:   Stop and chop   DESCRIPTION OF PROCEDURE:  The patient was examined and consented in the preoperative holding area where the aforementioned topical anesthesia was applied to the left eye and then brought back to the Operating Room where the left eye was prepped and draped in the usual sterile ophthalmic fashion and a lid speculum was placed. A paracentesis was created with the side port blade and the anterior chamber was filled with viscoelastic. A near clear corneal incision was performed with the steel keratome. A continuous curvilinear capsulorrhexis was performed with a cystotome followed by the capsulorrhexis forceps. Hydrodissection and hydrodelineation were carried out with BSS on a blunt cannula. The lens was removed in a stop and chop  technique and the remaining cortical material was removed with the irrigation-aspiration handpiece. The capsular bag was inflated with viscoelastic and the Technis [id number] lens was placed in the capsular bag without complication. The remaining viscoelastic was removed from the eye with the irrigation-aspiration handpiece. The wounds were hydrated. The anterior chamber was flushed with BSS and the eye was inflated to physiologic pressure. 0.158mVigamox was placed in the anterior chamber. The wounds were found to be water tight. The eye was dressed with Combigan. The patient was given protective glasses to wear throughout the day and a shield with which to sleep tonight. The patient was also given drops with which to begin a drop  regimen today and will follow-up with me in one day. Implant Name Type Inv. Item Serial No. Manufacturer Lot No. LRB No. Used Action  LENS CLAREON VIVITY 21.5 - [id number] CLAREON VIVITY 21.5 [id number]  Left 1 Implanted    Procedure(s): CATARACT EXTRACTION PHACO AND INTRAOCULAR LENS PLACEMENT (IOC) LEFT 7.45 00:43.5 (Left)  Electronically signed: WiBirder Robson1/14/2023 1:18 PM

## 2022-10-09 NOTE — Anesthesia Preprocedure Evaluation (Addendum)
Anesthesia Evaluation  Patient identified by MRN, date of birth, ID band Patient awake    Reviewed: Allergy & Precautions, H&P , NPO status , Patient's Chart, lab work & pertinent test results, reviewed documented beta blocker date and time   History of Anesthesia Complications (+) PONV and history of anesthetic complications  Airway Mallampati: I  TM Distance: >3 FB Neck ROM: full    Dental  (+) Dental Advidsory Given, Caps, Teeth Intact   Pulmonary neg shortness of breath, neg sleep apnea, COPD, neg recent URI, Patient abstained from smoking.Not current smoker, former smoker   Pulmonary exam normal breath sounds clear to auscultation       Cardiovascular Exercise Tolerance: Good (-) hypertension(-) angina + CAD and + Peripheral Vascular Disease  (-) Past MI and (-) Cardiac Stents Normal cardiovascular exam(-) dysrhythmias (-) Valvular Problems/Murmurs Rhythm:regular Rate:Normal     Neuro/Psych  PSYCHIATRIC DISORDERS Anxiety Depression    negative neurological ROS     GI/Hepatic negative GI ROS,,,Hemachromatosis   Endo/Other  neg diabetesHypothyroidism    Renal/GU negative Renal ROS  negative genitourinary   Musculoskeletal   Abdominal   Peds  Hematology negative hematology ROS (+)   Anesthesia Other Findings Past Medical History: No date: Arthritis 03/29/2014: Basal cell carcinoma     Comment:  L lower abdomen 01/18/2020: Basal cell carcinoma     Comment:  central nasal tip, [facility] 09/05/2020: Breast mass     Comment:  right breast mass 10 oclock No date: Carotid artery occlusion No date: Hemochromatosis No date: Hyperlipidemia No date: Hypocalcemia 2007: hypothyroidism     Comment:  acquired, secondary to surgery for thyroid Ca No date: Hypothyroidism No date: Migraine     Comment:  improved since thyroid surgery, 2 years 2007: multinodular goiter     Comment:  s/p thyroidectomy No date:  Peripheral arterial disease ([place])     Comment:  Left common iliac artery occlusion status post balloon               angioplasty without stent placement in January of 2014.               Repeat angiography in November of 2014 showed 50% distal               aortic stenosis extending into the left common iliac               artery which had 60-70% ostial stenosis. No other               significant disease. I placed on balloon expandable stent              into  left common iliac artery extending slightly into               the distal aorta No date: PONV (postoperative nausea and vomiting) 06/23/2018: Squamous cell carcinoma of skin     Comment:  R nasal tip/in situ, txt'd  with [facility]    Reproductive/Obstetrics negative OB ROS                             Anesthesia Physical Anesthesia Plan  ASA: 3  Anesthesia Plan: MAC   Post-op Pain Management:    Induction: Intravenous  PONV Risk Score and Plan: 2 and Treatment may vary due to age or medical condition and Midazolam  Airway Management Planned: Natural Airway and Nasal Cannula  Additional Equipment:  Intra-op Plan:   Post-operative Plan:   Informed Consent: I have reviewed the patients History and Physical, chart, labs and discussed the procedure including the risks, benefits and alternatives for the proposed anesthesia with the patient or authorized representative who has indicated his/her understanding and acceptance.     Dental Advisory Given  Plan Discussed with: Anesthesiologist, CRNA and Surgeon  Anesthesia Plan Comments:         Anesthesia Quick Evaluation

## 2022-10-09 NOTE — Transfer of Care (Signed)
Immediate Anesthesia Transfer of Care Note  Patient: Tracie Morris  Procedure(s) Performed: CATARACT EXTRACTION PHACO AND INTRAOCULAR LENS PLACEMENT (IOC) LEFT 7.45 00:43.5 (Left: Eye)  Patient Location: PACU  Anesthesia Type: MAC  Level of Consciousness: awake, alert  and patient cooperative  Airway and Oxygen Therapy: Patient Spontanous Breathing and Patient connected to supplemental oxygen  Post-op Assessment: Post-op Vital signs reviewed, Patient's Cardiovascular Status Stable, Respiratory Function Stable, Patent Airway and No signs of Nausea or vomiting  Post-op Vital Signs: Reviewed and stable  Complications: No notable events documented.

## 2022-10-09 NOTE — Anesthesia Postprocedure Evaluation (Signed)
Anesthesia Post Note  Patient: Tracie Morris  Procedure(s) Performed: CATARACT EXTRACTION PHACO AND INTRAOCULAR LENS PLACEMENT (IOC) LEFT 7.45 00:43.5 (Left: Eye)  Patient location during evaluation: PACU Anesthesia Type: MAC Level of consciousness: awake and alert Pain management: pain level controlled Vital Signs Assessment: post-procedure vital signs reviewed and stable Respiratory status: spontaneous breathing, nonlabored ventilation, respiratory function stable and patient connected to nasal cannula oxygen Cardiovascular status: stable and blood pressure returned to baseline Postop Assessment: no apparent nausea or vomiting Anesthetic complications: no   No notable events documented.   Last Vitals:  Vitals:   10/09/22 1323 10/09/22 1324  BP:  128/73  Pulse: 69 63  Resp: 13 (!) 23  Temp:  (!) 36.4 C  SpO2: 96% 97%    Last Pain:  Vitals:   10/09/22 1324  TempSrc:   PainSc: 0-No pain                 Martha Clan

## 2022-10-10 ENCOUNTER — Encounter: Payer: Self-pay | Admitting: Ophthalmology

## 2022-10-10 ENCOUNTER — Other Ambulatory Visit: Payer: Self-pay

## 2022-10-15 DIAGNOSIS — H2511 Age-related nuclear cataract, right eye: Secondary | ICD-10-CM | POA: Diagnosis not present

## 2022-10-29 NOTE — Discharge Instructions (Signed)

## 2022-10-30 ENCOUNTER — Ambulatory Visit: Payer: Medicare PPO | Admitting: General Practice

## 2022-10-30 ENCOUNTER — Other Ambulatory Visit: Payer: Self-pay

## 2022-10-30 ENCOUNTER — Ambulatory Visit
Admission: RE | Admit: 2022-10-30 | Discharge: 2022-10-30 | Disposition: A | Discharge status: Home / Self Care | Payer: Medicare PPO | Attending: Ophthalmology | Admitting: Ophthalmology

## 2022-10-30 ENCOUNTER — Encounter
Admission: RE | Disposition: A | Discharge status: Home / Self Care | Payer: Self-pay | Source: Home / Self Care | Attending: Ophthalmology

## 2022-10-30 ENCOUNTER — Encounter: Payer: Self-pay | Admitting: Ophthalmology

## 2022-10-30 DIAGNOSIS — F17210 Nicotine dependence, cigarettes, uncomplicated: Secondary | ICD-10-CM | POA: Insufficient documentation

## 2022-10-30 DIAGNOSIS — F419 Anxiety disorder, unspecified: Secondary | ICD-10-CM | POA: Diagnosis not present

## 2022-10-30 DIAGNOSIS — Z85828 Personal history of other malignant neoplasm of skin: Secondary | ICD-10-CM | POA: Diagnosis not present

## 2022-10-30 DIAGNOSIS — I2510 Atherosclerotic heart disease of native coronary artery without angina pectoris: Secondary | ICD-10-CM | POA: Diagnosis not present

## 2022-10-30 DIAGNOSIS — E785 Hyperlipidemia, unspecified: Secondary | ICD-10-CM | POA: Diagnosis not present

## 2022-10-30 DIAGNOSIS — F32A Depression, unspecified: Secondary | ICD-10-CM | POA: Diagnosis not present

## 2022-10-30 DIAGNOSIS — J449 Chronic obstructive pulmonary disease, unspecified: Secondary | ICD-10-CM | POA: Insufficient documentation

## 2022-10-30 DIAGNOSIS — H2511 Age-related nuclear cataract, right eye: Secondary | ICD-10-CM | POA: Diagnosis not present

## 2022-10-30 DIAGNOSIS — I6529 Occlusion and stenosis of unspecified carotid artery: Secondary | ICD-10-CM | POA: Insufficient documentation

## 2022-10-30 DIAGNOSIS — I708 Atherosclerosis of other arteries: Secondary | ICD-10-CM | POA: Insufficient documentation

## 2022-10-30 DIAGNOSIS — Q251 Coarctation of aorta: Secondary | ICD-10-CM | POA: Diagnosis not present

## 2022-10-30 DIAGNOSIS — E890 Postprocedural hypothyroidism: Secondary | ICD-10-CM | POA: Diagnosis not present

## 2022-10-30 DIAGNOSIS — T7840XA Allergy, unspecified, initial encounter: Secondary | ICD-10-CM | POA: Diagnosis not present

## 2022-10-30 HISTORY — PX: CATARACT EXTRACTION W/PHACO: SHX586

## 2022-10-30 SURGERY — PHACOEMULSIFICATION, CATARACT, WITH IOL INSERTION
Anesthesia: Monitor Anesthesia Care | Site: Eye | Laterality: Right

## 2022-10-30 MED ORDER — TETRACAINE HCL 0.5 % OP SOLN
1.0000 [drp] | OPHTHALMIC | Status: DC | PRN
  Administered 2022-10-30 (×3): 1 [drp] via OPHTHALMIC

## 2022-10-30 MED ORDER — ONDANSETRON HCL 4 MG/2ML IJ SOLN
INTRAMUSCULAR | Status: DC | PRN
  Administered 2022-10-30: 4 mg via INTRAVENOUS

## 2022-10-30 MED ORDER — MIDAZOLAM HCL 2 MG/2ML IJ SOLN
INTRAMUSCULAR | Status: DC | PRN
  Administered 2022-10-30: 2 mg via INTRAVENOUS

## 2022-10-30 MED ORDER — SIGHTPATH DOSE#1 BSS IO SOLN
INTRAOCULAR | Status: DC | PRN
  Administered 2022-10-30: 1 mL via INTRAMUSCULAR

## 2022-10-30 MED ORDER — SIGHTPATH DOSE#1 NA CHONDROIT SULF-NA HYALURON 40-17 MG/ML IO SOLN
INTRAOCULAR | Status: DC | PRN
  Administered 2022-10-30: 1 mL via INTRAOCULAR

## 2022-10-30 MED ORDER — SIGHTPATH DOSE#1 BSS IO SOLN
INTRAOCULAR | Status: DC | PRN
  Administered 2022-10-30: 69 mL via OPHTHALMIC

## 2022-10-30 MED ORDER — BRIMONIDINE TARTRATE-TIMOLOL 0.2-0.5 % OP SOLN
OPHTHALMIC | Status: DC | PRN
  Administered 2022-10-30: 1 [drp] via OPHTHALMIC

## 2022-10-30 MED ORDER — MOXIFLOXACIN HCL 0.5 % OP SOLN
OPHTHALMIC | Status: DC | PRN
  Administered 2022-10-30: .2 mL via OPHTHALMIC

## 2022-10-30 MED ORDER — ARMC OPHTHALMIC DILATING DROPS
1.0000 | OPHTHALMIC | Status: DC | PRN
  Administered 2022-10-30 (×3): 1 via OPHTHALMIC

## 2022-10-30 MED ORDER — SIGHTPATH DOSE#1 BSS IO SOLN
INTRAOCULAR | Status: DC | PRN
  Administered 2022-10-30: 15 mL

## 2022-10-30 MED ORDER — FENTANYL CITRATE (PF) 100 MCG/2ML IJ SOLN
INTRAMUSCULAR | Status: DC | PRN
  Administered 2022-10-30: 50 ug via INTRAVENOUS

## 2022-10-30 SURGICAL SUPPLY — 10 items
CATARACT SUITE SIGHTPATH (MISCELLANEOUS) ×1 IMPLANT
FEE CATARACT SUITE SIGHTPATH (MISCELLANEOUS) ×2 IMPLANT
GLOVE SURG ENC TEXT LTX SZ8 (GLOVE) ×2 IMPLANT
GLOVE SURG TRIUMPH 8.0 PF LTX (GLOVE) ×2 IMPLANT
LENS CLAREON VIVITY 21.0 ×1 IMPLANT
LENS IOL CLRN VT YLW 21.0 IMPLANT
NDL FILTER BLUNT 18X1 1/2 (NEEDLE) ×2 IMPLANT
NEEDLE FILTER BLUNT 18X1 1/2 (NEEDLE) ×1 IMPLANT
SYR 3ML LL SCALE MARK (SYRINGE) ×2 IMPLANT
WATER STERILE IRR 250ML POUR (IV SOLUTION) ×2 IMPLANT

## 2022-10-30 NOTE — Anesthesia Preprocedure Evaluation (Signed)
Anesthesia Evaluation  Patient identified by MRN, date of birth, ID band Patient awake    Reviewed: Allergy & Precautions, H&P , NPO status , Patient's Chart, lab work & pertinent test results, reviewed documented beta blocker date and time   History of Anesthesia Complications (+) PONV and history of anesthetic complications  Airway Mallampati: I  TM Distance: >3 FB Neck ROM: full    Dental  (+) Dental Advidsory Given, Caps, Teeth Intact   Pulmonary neg shortness of breath, neg sleep apnea, COPD, neg recent URI, Current Smoker and Patient abstained from smoking., former smoker   Pulmonary exam normal breath sounds clear to auscultation       Cardiovascular Exercise Tolerance: Good (-) hypertension(-) angina + CAD and + Peripheral Vascular Disease  (-) Past MI and (-) Cardiac Stents Normal cardiovascular exam(-) dysrhythmias (-) Valvular Problems/Murmurs Rhythm:regular Rate:Normal     Neuro/Psych  PSYCHIATRIC DISORDERS Anxiety Depression    negative neurological ROS     GI/Hepatic negative GI ROS,,,Hemachromatosis   Endo/Other  neg diabetesHypothyroidism    Renal/GU negative Renal ROS  negative genitourinary   Musculoskeletal   Abdominal   Peds  Hematology negative hematology ROS (+)   Anesthesia Other Findings Past Medical History: No date: Arthritis 03/29/2014: Basal cell carcinoma     Comment:  L lower abdomen 01/18/2020: Basal cell carcinoma     Comment:  central nasal tip, [facility] 09/05/2020: Breast mass     Comment:  right breast mass 10 oclock No date: Carotid artery occlusion No date: Hemochromatosis No date: Hyperlipidemia No date: Hypocalcemia 2007: hypothyroidism     Comment:  acquired, secondary to surgery for thyroid Ca No date: Hypothyroidism No date: Migraine     Comment:  improved since thyroid surgery, 2 years 2007: multinodular goiter     Comment:  s/p thyroidectomy No date:  Peripheral arterial disease ([place])     Comment:  Left common iliac artery occlusion status post balloon               angioplasty without stent placement in January of 2014.               Repeat angiography in November of 2014 showed 50% distal               aortic stenosis extending into the left common iliac               artery which had 60-70% ostial stenosis. No other               significant disease. I placed on balloon expandable stent              into  left common iliac artery extending slightly into               the distal aorta No date: PONV (postoperative nausea and vomiting) 06/23/2018: Squamous cell carcinoma of skin     Comment:  R nasal tip/in situ, txt'd  with [facility]    Reproductive/Obstetrics negative OB ROS                              Anesthesia Physical Anesthesia Plan  ASA: 3  Anesthesia Plan: MAC   Post-op Pain Management:    Induction: Intravenous  PONV Risk Score and Plan: 2 and Treatment may vary due to age or medical condition and Midazolam  Airway Management Planned: Natural Airway and Nasal Cannula  Additional  Equipment:   Intra-op Plan:   Post-operative Plan:   Informed Consent: I have reviewed the patients History and Physical, chart, labs and discussed the procedure including the risks, benefits and alternatives for the proposed anesthesia with the patient or authorized representative who has indicated his/her understanding and acceptance.     Dental Advisory Given  Plan Discussed with: Anesthesiologist, CRNA and Surgeon  Anesthesia Plan Comments: (Patient consented for risks of anesthesia including but not limited to:  - adverse reactions to medications - damage to eyes, teeth, lips or other oral mucosa - nerve damage due to positioning  - sore throat or hoarseness - Damage to heart, brain, nerves, lungs, other parts of body or loss of life  Patient voiced understanding.)         Anesthesia Quick  Evaluation

## 2022-10-30 NOTE — Transfer of Care (Signed)
Immediate Anesthesia Transfer of Care Note  Patient: Tracie Morris  Procedure(s) Performed: CATARACT EXTRACTION PHACO AND INTRAOCULAR LENS PLACEMENT (IOC) RIGHT  10.58  01:04.4 (Right: Eye)  Patient Location: PACU  Anesthesia Type: MAC  Level of Consciousness: awake, alert  and patient cooperative  Airway and Oxygen Therapy: Patient Spontanous Breathing and Patient connected to supplemental oxygen  Post-op Assessment: Post-op Vital signs reviewed, Patient's Cardiovascular Status Stable, Respiratory Function Stable, Patent Airway and No signs of Nausea or vomiting  Post-op Vital Signs: Reviewed and stable  Complications: There were no known notable events for this encounter.

## 2022-10-30 NOTE — Op Note (Signed)
PREOPERATIVE DIAGNOSIS:  Nuclear sclerotic cataract of the right eye.   POSTOPERATIVE DIAGNOSIS:  H25.11 Cataract   OPERATIVE PROCEDURE:ORPROCALL@   SURGEON:  Birder Robson, MD.   ANESTHESIA:  Anesthesiologist: Ilene Qua, MD CRNA: Patience Musca., CRNA  1.      Managed anesthesia care. 2.      0.27m of Shugarcaine was instilled in the eye following the paracentesis.   COMPLICATIONS:  None.   TECHNIQUE:   Stop and chop   DESCRIPTION OF PROCEDURE:  The patient was examined and consented in the preoperative holding area where the aforementioned topical anesthesia was applied to the right eye and then brought back to the Operating Room where the right eye was prepped and draped in the usual sterile ophthalmic fashion and a lid speculum was placed. A paracentesis was created with the side port blade and the anterior chamber was filled with viscoelastic. A near clear corneal incision was performed with the steel keratome. A continuous curvilinear capsulorrhexis was performed with a cystotome followed by the capsulorrhexis forceps. Hydrodissection and hydrodelineation were carried out with BSS on a blunt cannula. The lens was removed in a stop and chop  technique and the remaining cortical material was removed with the irrigation-aspiration handpiece. The capsular bag was inflated with viscoelastic and the Technis [id number]  lens was placed in the capsular bag without complication. The remaining viscoelastic was removed from the eye with the irrigation-aspiration handpiece. The wounds were hydrated. The anterior chamber was flushed with BSS and the eye was inflated to physiologic pressure. 0.173mof Vigamox was placed in the anterior chamber. The wounds were found to be water tight. The eye was dressed with Combigan. The patient was given protective glasses to wear throughout the day and a shield with which to sleep tonight. The patient was also given drops with which to begin a drop regimen  today and will follow-up with me in one day. Implant Name Type Inv. Item Serial No. Manufacturer Lot No. LRB No. Used Action  LENS CLAREON VIVITY 21.0 - [id number] CLAREON VIVITY 21.0 [id number]  Right 1 Implanted   Procedure(s): CATARACT EXTRACTION PHACO AND INTRAOCULAR LENS PLACEMENT (IOC) RIGHT  10.58  01:04.4 (Right)  Electronically signed: WiBirder Robson2/03/2022 8:43 AM

## 2022-10-30 NOTE — H&P (Signed)
[facility]   Primary Care Physician:  [name], MD Ophthalmologist: Dr.Nylia Gavina  Pre-Procedure History & Physical: HPI:  Tracie Morris is a 65 y.o. female here for cataract surgery.   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 Repeat angiography in November of 2014 showed 50% distal aortic stenosis extending into the left common iliac artery which had 60-70% ostial stenosis. No other significant disease. 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   R nasal tip/in situ, txt'd  with [facility]     Past Surgical History:  Procedure Laterality Date   ABDOMINAL AORTAGRAM N/A 10/14/2013   Procedure: ABDOMINAL [name];  Surgeon: Wellington Hampshire, MD;  Location: [place] CATH LAB;  Service: Cardiovascular;  Laterality: N/A;   ABDOMINAL AORTOGRAM W/LOWER EXTREMITY N/A 10/05/2020   Procedure: ABDOMINAL AORTOGRAM W/LOWER EXTREMITY;  Surgeon: Wellington Hampshire, MD;  Location: [place] CV LAB;  Service: Cardiovascular;  Laterality: N/A;   ANGIOPLASTY ILLIAC ARTERY     CATARACT EXTRACTION W/PHACO Left 10/09/2022   Procedure: CATARACT EXTRACTION PHACO AND INTRAOCULAR LENS PLACEMENT (IOC) LEFT 7.45 00:43.5;  Surgeon: Birder Robson,  MD;  Location: [place];  Service: Ophthalmology;  Laterality: Left;   CESAREAN SECTION     COLONOSCOPY WITH PROPOFOL N/A 09/22/2020   Procedure: COLONOSCOPY WITH PROPOFOL;  Surgeon: Lin Landsman, MD;  Location: [place];  Service: Endoscopy;  Laterality: N/A;   NOSE SURGERY     PERIPHERAL VASCULAR CATHETERIZATION N/A 11/14/2016   Procedure: Abdominal Aortogram;  Surgeon: Wellington Hampshire, MD;  Location: [place] CV LAB;  Service: Cardiovascular;  Laterality: N/A;   PERIPHERAL VASCULAR CATHETERIZATION Bilateral 11/14/2016   Procedure: Lower Extremity Angiography;  Surgeon: Wellington Hampshire, MD;  Location: [place] CV LAB;  Service: Cardiovascular;  Laterality: Bilateral;  limited iliacs only   PERIPHERAL VASCULAR CATHETERIZATION Left 11/14/2016   Procedure: Peripheral Vascular Intervention;  Surgeon: Wellington Hampshire, MD;  Location: [place] CV LAB;  Service: Cardiovascular;  Laterality: Left;  common iliac   PERIPHERAL VASCULAR INTERVENTION  10/05/2020   Procedure: PERIPHERAL VASCULAR INTERVENTION;  Surgeon: Wellington Hampshire, MD;  Location: [place] CV LAB;  Service: Cardiovascular;;  left common iliac    PERIPHERAL VASCULAR THROMBECTOMY  10/05/2020   Procedure: PERIPHERAL VASCULAR THROMBECTOMY;  Surgeon: Wellington Hampshire, MD;  Location: [place] CV LAB;  Service: Cardiovascular;;  Mechanical - Left common iliac, left internal iliac, and left SFA   POLYPECTOMY  09/22/2020   Procedure: POLYPECTOMY;  Surgeon: Lin Landsman, MD;  Location: [place];  Service: Endoscopy;;   SKIN CANCER EXCISION     THYROID SURGERY  2007   secondary to thyroid cancer    Prior to Admission medications   Medication Sig Start Date End Date Taking? Authorizing Provider  B Complex Vitamins (VITAMIN-B COMPLEX PO) Take by mouth daily.   Yes [provider]  clopidogrel (PLAVIX) 75 MG tablet TAKE 1 TABLET BY MOUTH ONCE DAILY 08/24/22  Yes Wellington Hampshire, MD  Colostrum 500 MG CAPS Take 500 mg by mouth daily.    Yes [provider]  Glycerin-Hypromellose-PEG 400 (DRY EYE RELIEF DROPS) 0.2-0.2-1 % SOLN Place 1 drop into both eyes daily as needed (Dry eye).   Yes [provider]  levothyroxine (SYNTHROID) 50 MCG tablet TAKE 1 TABLET BY MOUTH ONCE DAILY ON AN EMPTY STOMACH. WAIT 30 MINUTES BEFORE TAKING OTHER MEDS. 07/05/22  Yes [name], MD  propranolol (INDERAL) 10 MG tablet TAKE 1 TABLET BY MOUTH 3 TIMES DAILY AS NEEDED FOR TREMOR 05/08/22  Yes [name], MD  rosuvastatin (CRESTOR) 40 MG tablet TAKE 1 TABLET BY MOUTH ONCE EVERY EVENING 08/23/22  Yes [name], MD  VITAMIN D PO Take by mouth daily.   Yes [provider]  escitalopram (LEXAPRO) 10 MG tablet Take 1 tablet (10 mg total) by mouth daily. Patient not taking: Reported on 10/03/2022 07/12/22   [name], MD  mirtazapine (REMERON) 15 MG tablet TAKE 1 TABLET BY MOUTH AT BEDTIME Patient not taking: Reported on 10/03/2022 06/15/22   [name], MD  naloxone [facility]) nasal spray 4 mg/0.1 mL as directed. Patient not taking: Reported on 10/03/2022 04/30/22   [provider]  varenicline (CHANTIX CONTINUING MONTH PAK) 1 MG tablet Take 1 tablet (1 mg total) by mouth 2 (two) times daily. Patient not taking: Reported on 10/03/2022 10/07/21   [name], MD  varenicline (CHANTIX STARTING MONTH PAK) 0.5 MG X 11 & 1 MG X 42 tablet Take one 0.5 mg tablet by mouth once daily for 3 days, then increase to one 0.5 mg tablet twice daily for 4 days, then increase to one 1 mg tablet twice daily. Patient not taking: Reported on 10/03/2022 09/06/21   [name], MD    Allergies as of 08/24/2022 - Review Complete 07/12/2022  Allergen Reaction Noted   Atorvastatin Other (See Comments) 01/31/2015   Darvocet [propoxyphene n-acetaminophen] Nausea And Vomiting 10/19/2011   Demerol Nausea And Vomiting 10/19/2011   Methocarbamol Other (See  Comments) 01/31/2015   Mirtazapine  04/09/2016   Primidone  08/18/2018   Ambien [zolpidem tartrate] Palpitations 01/31/2015    Family History  Problem Relation Age of Onset   Alzheimer's disease Father    Stroke Mother    Coronary artery disease Maternal Grandmother    Heart disease Maternal Grandmother 32   Coronary artery disease Paternal Grandmother 75   Heart disease Paternal Grandmother 75   Breast cancer Neg Hx     Social History   Socioeconomic History   Marital status: Married    Spouse name: Not on file   Number of children: Not on file   Years of education: Not on file   Highest education level: Not on file  Occupational History   Not on file  Tobacco Use   Smoking status: Every Day    Packs/day: 1.00    Years: 20.00    Total pack years: 20.00    Types: Cigarettes    Last attempt to quit: 10/28/2011    Years since quitting: 11.0   Smokeless tobacco: Never  Vaping  Use   Vaping Use: Former  Substance and Sexual Activity   Alcohol use: Not Currently   Drug use: No   Sexual activity: Not on file  Other Topics Concern   Not on file  Social History Narrative   Not on file   Social Determinants of Health   Financial Resource Strain: Low Risk  (02/20/2022)   Overall Financial Resource Strain (CARDIA)    Difficulty of Paying Living Expenses: Not hard at all  Food Insecurity: No Food Insecurity (02/20/2022)   Hunger Vital Sign    Worried About Running Out of Food in the Last Year: Never true    Ran Out of Food in the Last Year: Never true  Transportation Needs: No Transportation Needs (02/20/2022)   PRAPARE - Hydrologist (Medical): No    Lack of Transportation (Non-Medical): No  Physical Activity: Not on file  Stress: No Stress Concern Present (02/20/2022)   [place]    Feeling of Stress : Not at all  Social Connections: Unknown (02/20/2022)   Social Connection  and Isolation Panel [NHANES]    Frequency of Communication with Friends and Family: Not on file    Frequency of Social Gatherings with Friends and Family: Not on file    Attends Religious Services: Not on file    Active Member of Clubs or Organizations: Not on file    Attends Archivist Meetings: Not on file    Marital Status: Married  Intimate Partner Violence: Not At Risk (02/20/2022)   Humiliation, Afraid, Rape, and Kick questionnaire    Fear of Current or Ex-Partner: No    Emotionally Abused: No    Physically Abused: No    Sexually Abused: No    Review of Systems: See HPI, otherwise negative ROS  Physical Exam: BP 121/70   Temp 97.7 F (36.5 C) (Tympanic)   Resp 13   Ht 5' 7.99" (1.727 m)   Wt 55.7 kg   SpO2 98%   BMI 18.68 kg/m  General:   Alert, cooperative in NAD Head:  Normocephalic and atraumatic. Respiratory:  Normal work of breathing. Cardiovascular:  RRR  Impression/Plan: Dillie Burandt is here for cataract surgery.  Risks, benefits, limitations, and alternatives regarding cataract surgery have been reviewed with the patient.  Questions have been answered.  All parties agreeable.   Birder Robson, MD  10/30/2022, 8:14 AM

## 2022-10-30 NOTE — Anesthesia Postprocedure Evaluation (Signed)
Anesthesia Post Note  Patient: Lafern Brinkley  Procedure(s) Performed: CATARACT EXTRACTION PHACO AND INTRAOCULAR LENS PLACEMENT (IOC) RIGHT  10.58  01:04.4 (Right: Eye)  Patient location during evaluation: PACU Anesthesia Type: MAC Level of consciousness: awake and alert Pain management: pain level controlled Vital Signs Assessment: post-procedure vital signs reviewed and stable Respiratory status: spontaneous breathing, nonlabored ventilation, respiratory function stable and patient connected to nasal cannula oxygen Cardiovascular status: stable and blood pressure returned to baseline Postop Assessment: no apparent nausea or vomiting Anesthetic complications: no   There were no known notable events for this encounter.   Last Vitals:  Vitals:   10/30/22 0842 10/30/22 0847  BP: 112/61 126/76  Pulse: 73 75  Resp: 18 18  Temp: (!) 36.4 C (!) 36.4 C  SpO2: 95% 95%    Last Pain:  Vitals:   10/30/22 0847  TempSrc:   PainSc: 0-No pain                 Ilene Qua

## 2022-10-31 ENCOUNTER — Encounter: Payer: Self-pay | Admitting: Ophthalmology

## 2022-11-07 NOTE — Telephone Encounter (Signed)
See Dr. Lupita Dawn comment.

## 2022-12-05 ENCOUNTER — Other Ambulatory Visit: Payer: Self-pay | Admitting: Cardiovascular Disease

## 2023-01-03 ENCOUNTER — Other Ambulatory Visit: Payer: Self-pay | Admitting: Internal Medicine

## 2023-01-14 ENCOUNTER — Encounter: Payer: Self-pay | Admitting: Internal Medicine

## 2023-01-14 ENCOUNTER — Ambulatory Visit: Payer: Medicare PPO | Admitting: Internal Medicine

## 2023-01-14 VITALS — BP 138/70 | HR 76 | Temp 98.3°F | Ht 67.0 in | Wt 120.0 lb

## 2023-01-14 DIAGNOSIS — E440 Moderate protein-calorie malnutrition: Secondary | ICD-10-CM | POA: Diagnosis not present

## 2023-01-14 DIAGNOSIS — I739 Peripheral vascular disease, unspecified: Secondary | ICD-10-CM

## 2023-01-14 DIAGNOSIS — E032 Hypothyroidism due to medicaments and other exogenous substances: Secondary | ICD-10-CM | POA: Diagnosis not present

## 2023-01-14 DIAGNOSIS — G4700 Insomnia, unspecified: Secondary | ICD-10-CM

## 2023-01-14 DIAGNOSIS — R911 Solitary pulmonary nodule: Secondary | ICD-10-CM

## 2023-01-14 DIAGNOSIS — D72820 Lymphocytosis (symptomatic): Secondary | ICD-10-CM

## 2023-01-14 DIAGNOSIS — Z716 Tobacco abuse counseling: Secondary | ICD-10-CM | POA: Diagnosis not present

## 2023-01-14 DIAGNOSIS — E7800 Pure hypercholesterolemia, unspecified: Secondary | ICD-10-CM

## 2023-01-14 DIAGNOSIS — E538 Deficiency of other specified B group vitamins: Secondary | ICD-10-CM

## 2023-01-14 DIAGNOSIS — Z79899 Other long term (current) drug therapy: Secondary | ICD-10-CM

## 2023-01-14 DIAGNOSIS — I798 Other disorders of arteries, arterioles and capillaries in diseases classified elsewhere: Secondary | ICD-10-CM | POA: Diagnosis not present

## 2023-01-14 DIAGNOSIS — E278 Other specified disorders of adrenal gland: Secondary | ICD-10-CM

## 2023-01-14 LAB — COMPREHENSIVE METABOLIC PANEL
ALT: 10 U/L (ref 0–35)
AST: 18 U/L (ref 0–37)
Albumin: 4.2 g/dL (ref 3.5–5.2)
Alkaline Phosphatase: 67 U/L (ref 39–117)
BUN: 20 mg/dL (ref 6–23)
CO2: 29 mEq/L (ref 19–32)
Calcium: 10.2 mg/dL (ref 8.4–10.5)
Chloride: 104 mEq/L (ref 96–112)
Creatinine, Ser: 0.76 mg/dL (ref 0.40–1.20)
GFR: 81.01 mL/min (ref >60.00)
Glucose, Bld: 82 mg/dL (ref 70–99)
Potassium: 4.3 mEq/L (ref 3.5–5.1)
Sodium: 139 mEq/L (ref 135–145)
Total Bilirubin: 0.4 mg/dL (ref 0.2–1.2)
Total Protein: 7.6 g/dL (ref 6.0–8.3)

## 2023-01-14 LAB — CBC WITH DIFFERENTIAL/PLATELET
Basophils Absolute: 0.1 10*3/uL (ref 0.0–0.1)
Basophils Relative: 0.7 % (ref 0.0–3.0)
Eosinophils Absolute: 0.4 10*3/uL (ref 0.0–0.7)
Eosinophils Relative: 3 % (ref 0.0–5.0)
HCT: 44.6 % (ref 36.0–46.0)
Hemoglobin: 15.1 g/dL — ABNORMAL HIGH (ref 12.0–15.0)
Lymphocytes Relative: 44.7 % (ref 12.0–46.0)
Lymphs Abs: 6.2 10*3/uL — ABNORMAL HIGH (ref 0.7–4.0)
MCHC: 33.8 g/dL (ref 30.0–36.0)
MCV: 100.4 fl — ABNORMAL HIGH (ref 78.0–100.0)
Monocytes Absolute: 0.9 10*3/uL (ref 0.1–1.0)
Monocytes Relative: 6.6 % (ref 3.0–12.0)
Neutro Abs: 6.3 10*3/uL (ref 1.4–7.7)
Neutrophils Relative %: 45 % (ref 43.0–77.0)
Platelets: 248 10*3/uL (ref 150.0–400.0)
RBC: 4.45 Mil/uL (ref 3.87–5.11)
RDW: 14.5 % (ref 11.5–15.5)
WBC: 13.9 10*3/uL — ABNORMAL HIGH (ref 4.0–10.5)

## 2023-01-14 LAB — LIPID PANEL
Cholesterol: 134 mg/dL (ref 0–200)
HDL: 53.4 mg/dL (ref >39.00)
LDL Cholesterol: 59 mg/dL (ref 0–99)
NonHDL: 80.63
Total CHOL/HDL Ratio: 3
Triglycerides: 108 mg/dL (ref 0.0–149.0)
VLDL: 21.6 mg/dL (ref 0.0–40.0)

## 2023-01-14 LAB — LDL CHOLESTEROL, DIRECT: Direct LDL: 71 mg/dL

## 2023-01-14 LAB — HEMOGLOBIN A1C: Hgb A1c MFr Bld: 6 % (ref 4.6–6.5)

## 2023-01-14 LAB — TSH: TSH: 4.76 u[IU]/mL (ref 0.35–5.50)

## 2023-01-14 MED ORDER — VARENICLINE TARTRATE (STARTER) 0.5 MG X 11 & 1 MG X 42 PO TBPK: ORAL_TABLET | ORAL | 0 refills | Status: DC

## 2023-01-14 MED ORDER — ROSUVASTATIN CALCIUM 40 MG PO TABS: ORAL_TABLET | ORAL | 3 refills | Status: DC

## 2023-01-14 MED ORDER — VARENICLINE TARTRATE 1 MG PO TABS: 1.0000 mg | ORAL_TABLET | Freq: Two times a day (BID) | ORAL | 2 refills | Status: DC

## 2023-01-14 MED ORDER — LEVOTHYROXINE SODIUM 50 MCG PO TABS: ORAL_TABLET | ORAL | 1 refills | Status: DC

## 2023-01-14 NOTE — Assessment & Plan Note (Signed)
No recurrent claudication. Recent noninvasive vascular evaluation showed normal ABI with patent stent in the left common iliac artery. Duplex showed mildly elevated velocities. Recommend continuing medical therapy and follow-up duplex in 1 year.

## 2023-01-14 NOTE — Assessment & Plan Note (Signed)
Encouraged to resume Chantix ,  rx fir starting apk and contiuining months sent to pharmacy

## 2023-01-14 NOTE — Assessment & Plan Note (Signed)
Incidentaloma.  Continue Annual follow up by Dr [name]

## 2023-01-14 NOTE — Assessment & Plan Note (Signed)
Sleeping well with mirtazipine and melatonin.

## 2023-01-14 NOTE — Patient Instructions (Addendum)
Annual Wellness Visit is due around 02/21/2023. Please scheduled this appointment at check out today.    You might want to try using Relaxium for insomnia  (as seen on TV commercials) available on [employer] . It contains:  Melatonin 5 mg  Chamomile 25 mg Passionflower extract 75 mg GABA 100 mg Ashwaganda extract 125 mg Magnesium citrate, glycinate, oxide (100 mg)  L tryptophan 500 mg Valerest (proprietary  ingredient ; probably valeria root extract)   Resume Chantix with the starting pak followed by at least 3 months of the maintenance dose    Continue Prevagen.  Forgetting names is more than likely due to your stress, NOT dementia!  However,  If Your thyoid is underactive I will adjust your Synthroid dose

## 2023-01-14 NOTE — Assessment & Plan Note (Signed)
She remains asymptomatic , taking asa and plavix indefinitely  .  Encouraged to quit smoking again and  continue  regular surveillance with Fletcher Anon or  Dr Lucky Cowboy

## 2023-01-14 NOTE — Progress Notes (Signed)
Subjective:  Patient ID: Tracie Morris, female    DOB: July 23, 1956  Age: 67 y.o. MRN: [id number]  CC: The primary encounter diagnosis was Iatrogenic hypothyroidism. Diagnoses of Pure hypercholesterolemia, Long-term use of high-risk medication, B12 deficiency, Tobacco abuse counseling, Moderate protein-calorie malnutrition ([place]), Peripheral vascular disease, secondary ([place]), Peripheral arterial disease ([place]), Insomnia, persistent, Adrenal mass, left ([place]), and Solitary pulmonary nodule were also pertinent to this visit.   HPI Tracie Morris presents for  Chief Complaint  Patient presents with   Medical Management of Chronic Issues    6 month follow up    She , husband Orpah Greek and mother  had [place] for Christmas.  Still having runny nose with cold exposure.  Resumed  smoking despite trial of  after COVID infection .  Out of chantix for the past 3 weeks.    1) aortic atherosclerosis and 3 vessel CAD by last CT chest : taking rosuvastatin , plavix   2) s/p bilateral cataracts removed in Nov and Dec by Porfilio   3) Depression with weight loss and insomnia:  stopped taking lexapro  because of symptoms of gastritis attriuted to SSRI ,  still taking mirtazapine .  Sleeping better.  Taking melatonin  in some commercial  natural supplements lost weight during COVID infection  4) forgetting people's names.  Started taking prevagen Anxiety about Dwight's condition   4) pulmonary nodule: last CT Oct 2023 ordered by Eric Form     Outpatient Medications Prior to Visit  Medication Sig Dispense Refill   B Complex Vitamins (VITAMIN-B COMPLEX PO) Take by mouth daily.     clopidogrel (PLAVIX) 75 MG tablet TAKE 1 TABLET BY MOUTH ONCE DAILY 90 tablet 0   Colostrum 500 MG CAPS Take 500 mg by mouth daily.      Glycerin-Hypromellose-PEG 400 (DRY EYE RELIEF DROPS) 0.2-0.2-1 % SOLN Place 1 drop into both eyes daily as needed (Dry eye).     mirtazapine (REMERON) 15 MG tablet TAKE 1 TABLET BY MOUTH AT  BEDTIME 90 tablet 1   naloxone (NARCAN) nasal spray 4 mg/0.1 mL as directed.     propranolol (INDERAL) 10 MG tablet TAKE 1 TABLET BY MOUTH 3 TIMES DAILY AS NEEDED FOR TREMOR 90 tablet 3   VITAMIN D PO Take by mouth daily.     levothyroxine (SYNTHROID) 50 MCG tablet TAKE 1 TABLET BY MOUTH ONCE DAILY ON AN EMPTY STOMACH. WAIT 30 MINUTES BEFORE TAKING OTHER MEDS. 90 tablet 1   rosuvastatin (CRESTOR) 40 MG tablet TAKE 1 TABLET BY MOUTH ONCE EVERY EVENING 90 tablet 3   escitalopram (LEXAPRO) 10 MG tablet Take 1 tablet (10 mg total) by mouth daily. (Patient not taking: Reported on 10/03/2022) 30 tablet 5   varenicline (CHANTIX CONTINUING MONTH PAK) 1 MG tablet Take 1 tablet (1 mg total) by mouth 2 (two) times daily. (Patient not taking: Reported on 01/14/2023) 60 tablet 2   varenicline (CHANTIX STARTING MONTH PAK) 0.5 MG X 11 & 1 MG X 42 tablet Take one 0.5 mg tablet by mouth once daily for 3 days, then increase to one 0.5 mg tablet twice daily for 4 days, then increase to one 1 mg tablet twice daily. (Patient not taking: Reported on 01/14/2023) 53 tablet 0   No facility-administered medications prior to visit.    Review of Systems;  Patient denies headache, fevers, malaise, unintentional weight loss, skin rash, eye pain, sinus congestion and sinus pain, sore throat, dysphagia,  hemoptysis , cough, dyspnea, wheezing, chest pain,  palpitations, orthopnea, edema, abdominal pain, nausea, melena, diarrhea, constipation, flank pain, dysuria, hematuria, urinary  Frequency, nocturia, numbness, tingling, seizures,  Focal weakness, Loss of consciousness,  Tremor, insomnia, depression, anxiety, and suicidal ideation.      Objective:  BP 138/70   Pulse 76   Temp 98.3 F (36.8 C) (Oral)   Ht 5' 7"$  (1.702 m)   Wt 120 lb (54.4 kg)   SpO2 97%   BMI 18.79 kg/m   BP Readings from Last 3 Encounters:  01/14/23 138/70  10/30/22 126/76  10/09/22 128/73    Wt Readings from Last 3 Encounters:  01/14/23 120 lb  (54.4 kg)  10/30/22 122 lb 12.8 oz (55.7 kg)  10/09/22 123 lb (55.8 kg)    Physical Exam Vitals reviewed.  Constitutional:      General: She is not in acute distress.    Appearance: Normal appearance. She is normal weight. She is not ill-appearing, toxic-appearing or diaphoretic.  HENT:     Head: Normocephalic.  Eyes:     General: No scleral icterus.       Right eye: No discharge.        Left eye: No discharge.     Conjunctiva/sclera: Conjunctivae normal.  Cardiovascular:     Rate and Rhythm: Normal rate and regular rhythm.     Heart sounds: Normal heart sounds.  Pulmonary:     Effort: Pulmonary effort is normal. No respiratory distress.     Breath sounds: Normal breath sounds.  Musculoskeletal:        General: Normal range of motion.  Skin:    General: Skin is warm and dry.  Neurological:     General: No focal deficit present.     Mental Status: She is alert and oriented to person, place, and time. Mental status is at baseline.  Psychiatric:        Mood and Affect: Mood normal.        Behavior: Behavior normal.        Thought Content: Thought content normal.        Judgment: Judgment normal.    Lab Results  Component Value Date   HGBA1C 6.0 01/14/2023   HGBA1C 6.4 07/12/2022   HGBA1C 4.2 (L) 12/22/2018    Lab Results  Component Value Date   CREATININE 0.76 01/14/2023   CREATININE 0.86 07/12/2022   CREATININE 0.81 12/07/2021    Lab Results  Component Value Date   WBC 13.9 (H) 01/14/2023   HGB 15.1 (H) 01/14/2023   HCT 44.6 01/14/2023   PLT 248.0 01/14/2023   GLUCOSE 82 01/14/2023   CHOL 134 01/14/2023   TRIG 108.0 01/14/2023   HDL 53.40 01/14/2023   LDLDIRECT 71.0 01/14/2023   LDLCALC 59 01/14/2023   ALT 10 01/14/2023   AST 18 01/14/2023   NA 139 01/14/2023   K 4.3 01/14/2023   CL 104 01/14/2023   CREATININE 0.76 01/14/2023   BUN 20 01/14/2023   CO2 29 01/14/2023   TSH 4.76 01/14/2023   INR 1.0 11/05/2016   HGBA1C 6.0 01/14/2023   MICROALBUR  0.6 01/11/2014    No results found.  Assessment & Plan:  .Iatrogenic hypothyroidism -     TSH  Pure hypercholesterolemia -     Lipid panel -     LDL cholesterol, direct -     Comprehensive metabolic panel -     Hemoglobin A1c  Long-term use of high-risk medication -     CBC with Differential/Platelet  B12 deficiency -  Methylmalonic acid, serum  Tobacco abuse counseling Assessment & Plan: Encouraged to resume Chantix ,  rx fir starting apk and contiuining months sent to pharmacy   Moderate protein-calorie malnutrition ([place]) Assessment & Plan: Weight is improving after losing several lbs during COVID infection over christmas.  Continue mirtazapine   Peripheral vascular disease, secondary ([place]) Assessment & Plan: She remains asymptomatic , taking asa and plavix indefinitely  .  Encouraged to quit smoking again and  continue  regular surveillance with [facility] or  Dr Lucky Cowboy   Peripheral arterial disease [facility]) Assessment & Plan: No recurrent claudication. Recent noninvasive vascular evaluation showed normal ABI with patent stent in the left common iliac artery. Duplex showed mildly elevated velocities. Recommend continuing medical therapy and follow-up duplex in 1 year.   Insomnia, persistent Assessment & Plan: Sleeping well with mirtazipine and melatonin.    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  Continue Annual follow up by Dr [name]   Solitary pulmonary nodule Assessment & Plan: Serial CTs stopped in 2019 due to lack of appreciable change,. But have resumed nnually.  Ast once Oct 2023    Other orders -     Levothyroxine Sodium; TAKE 1 TABLET BY MOUTH ONCE DAILY ON AN EMPTY STOMACH. WAIT 30 MINUTES BEFORE TAKING OTHER MEDS  Dispense: 90 tablet; Refill: 1 -     Rosuvastatin Calcium; TAKE 1 TABLET BY MOUTH ONCE EVERY EVENING  Dispense: 90 tablet; Refill: 3 -     Varenicline Tartrate (Starter); Take in gradually increasing doses as directed   Dispense: 53 each; Refill: 0 -     Varenicline Tartrate; Take 1 tablet (1 mg total) by mouth 2 (two) times daily.  Dispense: 60 tablet; Refill: 2     I provided 40 minutes of face-to-face time during this encounter reviewing patient's last visit with me, patient's  most recent visit with pulmonology, vascular surgery .  Ophthalmology,    recent surgical and non surgical procedures, previous  labs and imaging studies, counseling on currently addressed issues,  and post visit ordering to diagnostics and therapeutics .   Follow-up: Return in about 6 months (around 07/15/2023).   [name], MD

## 2023-01-14 NOTE — Assessment & Plan Note (Addendum)
Serial CTs stopped in 2019 due to lack of appreciable change,. But have resumed nnually.  Ast once Oct 2023

## 2023-01-14 NOTE — Assessment & Plan Note (Signed)
Weight is improving after losing several lbs during COVID infection over christmas.  Continue mirtazapine

## 2023-01-15 NOTE — Addendum Note (Signed)
Addended by: [name] on: 01/15/2023 08:54 AM   Modules accepted: Orders

## 2023-01-15 NOTE — Assessment & Plan Note (Signed)
Present for over a year.  Asymptomatic.  Referring to hematology for rule out CLL

## 2023-01-17 LAB — METHYLMALONIC ACID, SERUM: Methylmalonic Acid, Quant: 220 nmol/L (ref 87–318)

## 2023-01-25 ENCOUNTER — Inpatient Hospital Stay: Payer: Medicare PPO | Admitting: Oncology

## 2023-01-25 ENCOUNTER — Other Ambulatory Visit: Payer: Self-pay

## 2023-01-25 ENCOUNTER — Inpatient Hospital Stay: Payer: Medicare PPO | Attending: Oncology

## 2023-01-25 ENCOUNTER — Encounter: Payer: Self-pay | Admitting: Oncology

## 2023-01-25 VITALS — BP 108/60 | HR 82 | Temp 98.6°F | Resp 16 | Ht 67.0 in | Wt 118.8 lb

## 2023-01-25 DIAGNOSIS — D72820 Lymphocytosis (symptomatic): Secondary | ICD-10-CM | POA: Insufficient documentation

## 2023-01-25 DIAGNOSIS — Z7902 Long term (current) use of antithrombotics/antiplatelets: Secondary | ICD-10-CM | POA: Diagnosis not present

## 2023-01-25 DIAGNOSIS — F17210 Nicotine dependence, cigarettes, uncomplicated: Secondary | ICD-10-CM | POA: Insufficient documentation

## 2023-01-25 DIAGNOSIS — E890 Postprocedural hypothyroidism: Secondary | ICD-10-CM | POA: Insufficient documentation

## 2023-01-25 DIAGNOSIS — Z85850 Personal history of malignant neoplasm of thyroid: Secondary | ICD-10-CM | POA: Insufficient documentation

## 2023-01-25 DIAGNOSIS — Z79899 Other long term (current) drug therapy: Secondary | ICD-10-CM | POA: Insufficient documentation

## 2023-01-25 LAB — CBC WITH DIFFERENTIAL (CANCER CENTER ONLY)
Abs Immature Granulocytes: 0.06 10*3/uL (ref 0.00–0.07)
Basophils Absolute: 0.1 10*3/uL (ref 0.0–0.1)
Basophils Relative: 1 %
Eosinophils Absolute: 0.3 10*3/uL (ref 0.0–0.5)
Eosinophils Relative: 2 %
HCT: 47 % — ABNORMAL HIGH (ref 36.0–46.0)
Hemoglobin: 15.7 g/dL — ABNORMAL HIGH (ref 12.0–15.0)
Immature Granulocytes: 0 %
Lymphocytes Relative: 43 %
Lymphs Abs: 7.3 10*3/uL — ABNORMAL HIGH (ref 0.7–4.0)
MCH: 33.5 pg (ref 26.0–34.0)
MCHC: 33.4 g/dL (ref 30.0–36.0)
MCV: 100.4 fL — ABNORMAL HIGH (ref 80.0–100.0)
Monocytes Absolute: 0.7 10*3/uL (ref 0.1–1.0)
Monocytes Relative: 4 %
Neutro Abs: 8.6 10*3/uL — ABNORMAL HIGH (ref 1.7–7.7)
Neutrophils Relative %: 50 %
Platelet Count: 293 10*3/uL (ref 150–400)
RBC: 4.68 MIL/uL (ref 3.87–5.11)
RDW: 13.9 % (ref 11.5–15.5)
Smear Review: NORMAL
WBC Count: 17.1 10*3/uL — ABNORMAL HIGH (ref 4.0–10.5)
nRBC: 0 % (ref 0.0–0.2)

## 2023-01-25 LAB — CMP (CANCER CENTER ONLY)
ALT: 15 U/L (ref 0–44)
AST: 24 U/L (ref 15–41)
Albumin: 4.2 g/dL (ref 3.5–5.0)
Alkaline Phosphatase: 84 U/L (ref 38–126)
Anion gap: 10 (ref 5–15)
BUN: 15 mg/dL (ref 8–23)
CO2: 26 mmol/L (ref 22–32)
Calcium: 9.1 mg/dL (ref 8.9–10.3)
Chloride: 102 mmol/L (ref 98–111)
Creatinine: 0.81 mg/dL (ref 0.44–1.00)
GFR, Estimated: >60 mL/min (ref >60)
Glucose, Bld: 104 mg/dL — ABNORMAL HIGH (ref 70–99)
Potassium: 4.1 mmol/L (ref 3.5–5.1)
Sodium: 138 mmol/L (ref 135–145)
Total Bilirubin: 0.6 mg/dL (ref 0.3–1.2)
Total Protein: 8 g/dL (ref 6.5–8.1)

## 2023-01-25 LAB — IRON AND TIBC
Iron: 201 ug/dL — ABNORMAL HIGH (ref 28–170)
Saturation Ratios: 50 % — ABNORMAL HIGH (ref 10.4–31.8)
TIBC: 406 ug/dL (ref 250–450)
UIBC: 205 ug/dL

## 2023-01-25 LAB — FERRITIN: Ferritin: 116 ng/mL (ref 11–307)

## 2023-01-25 NOTE — Addendum Note (Signed)
Addended by: Philipp Deputy on: 01/25/2023 09:30 AM   Modules accepted: Orders

## 2023-01-25 NOTE — Progress Notes (Signed)
[place]  Telephone:(336) [phone] Fax:(336) [phone]  ID: Evelena Leyden OB: 1956-08-15  MR#: [id number]  [id number]  Patient Care Team: [name], MD as PCP - General (Internal Medicine) Wellington Hampshire, MD as PCP - Cardiology (Cardiology) [name], MD (Internal Medicine)  CHIEF COMPLAINT: Hereditary hemochromatosis, compound heterozygote for C282Y and H63D mutations, lymphocytosis.  INTERVAL HISTORY: Patient last seen in clinic in October 2022.  She is referred back for evaluation of persistent lymphocytosis.  She continues to feel well and remains asymptomatic. She has no neurologic complaints.  She denies any weight loss.  She denies any recent fevers or illnesses, but states she is COVID "5 times".  She has no chest pain, shortness of breath, cough, or hemoptysis.  She denies any nausea, vomiting, constipation, or diarrhea.  She has no urinary complaints.  Patient offers no further specific complaints today.  REVIEW OF SYSTEMS:   Review of Systems  Constitutional: Negative.  Negative for fever, malaise/fatigue and weight loss.  HENT: Negative.    Respiratory: Negative.  Negative for cough and shortness of breath.   Cardiovascular: Negative.  Negative for chest pain and leg swelling.  Gastrointestinal: Negative.  Negative for abdominal pain and diarrhea.  Genitourinary: Negative.  Negative for dysuria.  Musculoskeletal: Negative.  Negative for back pain.  Skin: Negative.  Negative for rash.  Neurological: Negative.  Negative for tremors, focal weakness, weakness and headaches.  Psychiatric/Behavioral: Negative.  The patient is not nervous/anxious.     As per HPI. Otherwise, a complete review of systems is negative.  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 Repeat angiography in November of 2014 showed 50% distal aortic stenosis extending into the left common iliac artery which had 60-70% ostial stenosis. No other significant disease. 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   R nasal tip/in situ, txt'd  with [facility]     PAST SURGICAL HISTORY: Past Surgical History:  Procedure Laterality Date   ABDOMINAL AORTAGRAM N/A 10/14/2013   Procedure: ABDOMINAL [name];  Surgeon: Wellington Hampshire, MD;  Location: [facility];  Service: Cardiovascular;  Laterality: N/A;   ABDOMINAL AORTOGRAM W/LOWER EXTREMITY N/A 10/05/2020   Procedure: ABDOMINAL AORTOGRAM W/LOWER EXTREMITY;  Surgeon: Wellington Hampshire, MD;  Location: [facility];  Service: Cardiovascular;  Laterality: N/A;   ANGIOPLASTY ILLIAC ARTERY     CATARACT EXTRACTION W/PHACO Left 10/09/2022   Procedure: CATARACT EXTRACTION PHACO AND INTRAOCULAR LENS PLACEMENT (IOC) LEFT 7.45 00:43.5;  Surgeon: Birder Robson, MD;  Location: [facility];  Service: Ophthalmology;  Laterality: Left;   CATARACT EXTRACTION W/PHACO Right 10/30/2022  Procedure: CATARACT EXTRACTION PHACO AND INTRAOCULAR LENS PLACEMENT (IOC) RIGHT  10.58  01:04.4;  Surgeon: Birder Robson, MD;  Location: [facility];  Service: Ophthalmology;  Laterality: Right;   CESAREAN SECTION     COLONOSCOPY WITH PROPOFOL  N/A 09/22/2020   Procedure: COLONOSCOPY WITH PROPOFOL;  Surgeon: Lin Landsman, MD;  Location: [facility];  Service: Endoscopy;  Laterality: N/A;   NOSE SURGERY     PERIPHERAL VASCULAR CATHETERIZATION N/A 11/14/2016   Procedure: Abdominal Aortogram;  Surgeon: Wellington Hampshire, MD;  Location: [facility];  Service: Cardiovascular;  Laterality: N/A;   PERIPHERAL VASCULAR CATHETERIZATION Bilateral 11/14/2016   Procedure: Lower Extremity Angiography;  Surgeon: Wellington Hampshire, MD;  Location: [facility];  Service: Cardiovascular;  Laterality: Bilateral;  limited iliacs only   PERIPHERAL VASCULAR CATHETERIZATION Left 11/14/2016   Procedure: Peripheral Vascular Intervention;  Surgeon: Wellington Hampshire, MD;  Location: [facility];  Service: Cardiovascular;  Laterality: Left;  common iliac   PERIPHERAL VASCULAR INTERVENTION  10/05/2020   Procedure: PERIPHERAL VASCULAR INTERVENTION;  Surgeon: Wellington Hampshire, MD;  Location: [facility];  Service: Cardiovascular;;  left common iliac    PERIPHERAL VASCULAR THROMBECTOMY  10/05/2020   Procedure: PERIPHERAL VASCULAR THROMBECTOMY;  Surgeon: Wellington Hampshire, MD;  Location: [facility];  Service: Cardiovascular;;  Mechanical - Left common iliac, left internal iliac, and left SFA   POLYPECTOMY  09/22/2020   Procedure: POLYPECTOMY;  Surgeon: Lin Landsman, MD;  Location: [facility];  Service: Endoscopy;;   SKIN CANCER EXCISION     THYROID SURGERY  2007   secondary to thyroid cancer    FAMILY HISTORY: Family History  Problem Relation Age of Onset   Alzheimer's disease Father    Stroke Mother    Coronary artery disease Maternal Grandmother    Heart disease Maternal Grandmother 97   Coronary artery disease Paternal Grandmother 77   Heart disease Paternal Grandmother 75   Breast cancer Neg Hx     ADVANCED DIRECTIVES (Y/N):  N  HEALTH MAINTENANCE: Social History   Tobacco Use    Smoking status: Every Day    Packs/day: 1.00    Years: 20.00    Total pack years: 20.00    Types: Cigarettes    Last attempt to quit: 10/28/2011    Years since quitting: 11.2   Smokeless tobacco: Never  Vaping Use   Vaping Use: Former  Substance Use Topics   Alcohol use: Not Currently   Drug use: No     Colonoscopy:  PAP:  Bone density:  Lipid panel:  Allergies  Allergen Reactions   Atorvastatin Other (See Comments)    Myalgias    Darvocet [Propoxyphene N-Acetaminophen] Nausea And Vomiting   Demerol Nausea And Vomiting   Mirtazapine     Sleeplessness, nausea    Other     "Gut Sutures"   Primidone     Severe insomnia, myoclonus and parathesias    Ambien [Zolpidem Tartrate] Palpitations    Anxiety , trembling.    Current Outpatient Medications  Medication Sig Dispense Refill   B Complex Vitamins (VITAMIN-B COMPLEX PO) Take by mouth daily.     clopidogrel (PLAVIX) 75 MG tablet TAKE 1 TABLET BY MOUTH ONCE DAILY 90 tablet 0   Colostrum 500 MG CAPS Take 500 mg by mouth daily.      Glycerin-Hypromellose-PEG 400 (DRY EYE RELIEF DROPS) 0.2-0.2-1 % SOLN Place 1 drop into both eyes daily as  needed (Dry eye).     levothyroxine (SYNTHROID) 50 MCG tablet TAKE 1 TABLET BY MOUTH ONCE DAILY ON AN EMPTY STOMACH. WAIT 30 MINUTES BEFORE TAKING OTHER MEDS 90 tablet 1   mirtazapine (REMERON) 15 MG tablet TAKE 1 TABLET BY MOUTH AT BEDTIME 90 tablet 1   naloxone (NARCAN) nasal spray 4 mg/0.1 mL as directed.     propranolol (INDERAL) 10 MG tablet TAKE 1 TABLET BY MOUTH 3 TIMES DAILY AS NEEDED FOR TREMOR 90 tablet 3   rosuvastatin (CRESTOR) 40 MG tablet TAKE 1 TABLET BY MOUTH ONCE EVERY EVENING 90 tablet 3   [START ON 02/12/2023] varenicline (CHANTIX CONTINUING MONTH PAK) 1 MG tablet Take 1 tablet (1 mg total) by mouth 2 (two) times daily. 60 tablet 2   Varenicline Tartrate, Starter, (CHANTIX STARTING MONTH PAK) 0.5 MG X 11 & 1 MG X 42 TBPK Take in gradually increasing doses as directed 53 each  0   VITAMIN D PO Take by mouth daily.     No current facility-administered medications for this visit.    OBJECTIVE: Vitals:   01/25/23 0941  BP: 108/60  Pulse: 82  Resp: 16  Temp: 98.6 F (37 C)  SpO2: 99%     Body mass index is 18.61 kg/m.    ECOG FS:0 - Asymptomatic  General: Well-developed, well-nourished, no acute distress. Eyes: Pink conjunctiva, anicteric sclera. HEENT: Normocephalic, moist mucous membranes. Lungs: No audible wheezing or coughing. Heart: Regular rate and rhythm. Abdomen: Soft, nontender, no obvious distention. Musculoskeletal: No edema, cyanosis, or clubbing. Neuro: Alert, answering all questions appropriately. Cranial nerves grossly intact. Skin: No rashes or petechiae noted. Psych: Normal affect.  LAB RESULTS:  Lab Results  Component Value Date   NA 138 01/25/2023   K 4.1 01/25/2023   CL 102 01/25/2023   CO2 26 01/25/2023   GLUCOSE 104 (H) 01/25/2023   BUN 15 01/25/2023   CREATININE 0.81 01/25/2023   CALCIUM 9.1 01/25/2023   PROT 8.0 01/25/2023   ALBUMIN 4.2 01/25/2023   AST 24 01/25/2023   ALT 15 01/25/2023   ALKPHOS 84 01/25/2023   BILITOT 0.6 01/25/2023   GFRNONAA >60 01/25/2023   GFRAA 109 08/30/2020    Lab Results  Component Value Date   WBC 17.1 (H) 01/25/2023   NEUTROABS 8.6 (H) 01/25/2023   HGB 15.7 (H) 01/25/2023   HCT 47.0 (H) 01/25/2023   MCV 100.4 (H) 01/25/2023   PLT 293 01/25/2023   Lab Results  Component Value Date   IRON 201 (H) 01/25/2023   TIBC 406 01/25/2023   IRONPCTSAT 50 (H) 01/25/2023   Lab Results  Component Value Date   FERRITIN 116 01/25/2023     STUDIES: No results found.   ASSESSMENT: Hereditary hemochromatosis, compound heterozygote for C282Y and H63D mutations, lymphocytosis.  PLAN:    Lymphocytosis: Chronic and unchanged.  Patient's total white blood cell count has ranged from 7.6-17.1 since December 2017 with a lymphocyte predominance.  Today's result is 17.1.  Peripheral blood  flow cytometry has been ordered for completeness and is pending at time of dictation.  Patient will have video-assisted telemedicine visit in 3 weeks for further evaluation and discussion of her laboratory results. Hereditary hemochromatosis: Patient's hemoglobin is mildly increased at 15.7 with an increased total iron and saturation ratio.  Her ferritin is 116.  Unclear when patient last had phlebotomy, but suspect it has been greater than 2 years.  Will discuss reinitiating treatment at video visit as above.    I spent  a total of 30 minutes reviewing chart data, face-to-face evaluation with the patient, counseling and coordination of care as detailed above.   Patient expressed understanding and was in agreement with this plan. She also understands that She can call clinic at any time with any questions, concerns, or complaints.    Lloyd Huger, MD   01/25/2023 11:17 AM

## 2023-01-28 LAB — COMP PANEL: LEUKEMIA/LYMPHOMA: Immunophenotypic Profile: 21

## 2023-01-31 ENCOUNTER — Telehealth: Payer: Self-pay | Admitting: Cardiovascular Disease

## 2023-01-31 NOTE — Telephone Encounter (Signed)
LMOV to reschedule ABI as machine is down at this time We are able to complete Aorta 03/12 if patient agrees or we can reschedule them both

## 2023-02-19 ENCOUNTER — Telehealth: Payer: Self-pay | Admitting: Internal Medicine

## 2023-02-19 NOTE — Telephone Encounter (Signed)
Copied from [place] [phone]. Topic: Medicare AWV >> Feb 19, 2023  3:19 PM Lollie Marrow wrote: Reason for CRM: Called patient to schedule Medicare Annual Wellness Visit (AWV). Left message for patient to call back and schedule Medicare Annual Wellness Visit (AWV).  Last date of AWV: 02/20/2022  Please schedule an AWVS appointment at any time with [facility].  If any questions, please contact me at [phone].    Thank you,  [place] Direct dial  [phone]

## 2023-02-22 ENCOUNTER — Inpatient Hospital Stay (HOSPITAL_BASED_OUTPATIENT_CLINIC_OR_DEPARTMENT_OTHER): Payer: Medicare PPO | Admitting: Oncology

## 2023-02-22 ENCOUNTER — Encounter: Payer: Self-pay | Admitting: Oncology

## 2023-02-22 DIAGNOSIS — C9110 Chronic lymphocytic leukemia of B-cell type not having achieved remission: Secondary | ICD-10-CM

## 2023-02-22 NOTE — Progress Notes (Signed)
Patient scheduled for virtual visit today to review lab results from visit regarding lymphocytosis. Patient denies concerns.

## 2023-02-22 NOTE — Progress Notes (Signed)
[facility] [place]  Telephone:(336) [phone] Fax:(336) [phone]  ID: Tracie Morris OB: 1956/03/05  MR#: [id number]  [id number]  Patient Care Team: [name], MD as PCP - General (Internal Medicine) Wellington Hampshire, MD as PCP - Cardiology (Cardiology) [name], MD (Internal Medicine)  I connected with Tracie Morris on 02/22/23 at 11:15 AM EDT by video enabled telemedicine visit and verified that I am speaking with the correct person using two identifiers.   I discussed the limitations, risks, security and privacy concerns of performing an evaluation and management service by telemedicine and the availability of in-person appointments. I also discussed with the patient that there may be a patient responsible charge related to this service. The patient expressed understanding and agreed to proceed.   Other persons participating in the visit and their role in the encounter: Patient, MD.  Patient's location: Home. Provider's location: Clinic.  CHIEF COMPLAINT: CLL, hereditary hemochromatosis, compound heterozygote for C282Y and H63D mutations.  INTERVAL HISTORY: Patient agreed to video assisted telemedicine visit for further evaluation and discussion of her laboratory results.  She continues to feel well and remains asymptomatic. She has no neurologic complaints.  She denies any weight loss.  She denies any recent fevers or illnesses.  She has no chest pain, shortness of breath, cough, or hemoptysis.  She denies any nausea, vomiting, constipation, or diarrhea.  She has no urinary complaints.  Patient offers no specific complaints today.  REVIEW OF SYSTEMS:   Review of Systems  Constitutional: Negative.  Negative for fever, malaise/fatigue and weight loss.  HENT: Negative.    Respiratory: Negative.  Negative for cough and shortness of breath.   Cardiovascular: Negative.  Negative for chest pain and leg swelling.  Gastrointestinal: Negative.  Negative for  abdominal pain and diarrhea.  Genitourinary: Negative.  Negative for dysuria.  Musculoskeletal: Negative.  Negative for back pain.  Skin: Negative.  Negative for rash.  Neurological: Negative.  Negative for tremors, focal weakness, weakness and headaches.  Psychiatric/Behavioral: Negative.  The patient is not nervous/anxious.     As per HPI. Otherwise, a complete review of systems is negative.  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 Repeat angiography in November of 2014 showed 50% distal aortic stenosis extending into the left common iliac artery which had 60-70% ostial stenosis. No other significant disease. 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   R nasal tip/in situ, txt'd  with [facility]     PAST SURGICAL HISTORY: Past Surgical History:  Procedure Laterality Date   ABDOMINAL AORTAGRAM N/A 10/14/2013   Procedure: ABDOMINAL [name];  Surgeon: Wellington Hampshire,  MD;  Location: [facility];  Service: Cardiovascular;  Laterality: N/A;   ABDOMINAL AORTOGRAM W/LOWER EXTREMITY N/A 10/05/2020   Procedure: ABDOMINAL AORTOGRAM W/LOWER EXTREMITY;  Surgeon: Wellington Hampshire, MD;  Location: [facility];   Service: Cardiovascular;  Laterality: N/A;   ANGIOPLASTY ILLIAC ARTERY     CATARACT EXTRACTION W/PHACO Left 10/09/2022   Procedure: CATARACT EXTRACTION PHACO AND INTRAOCULAR LENS PLACEMENT (IOC) LEFT 7.45 00:43.5;  Surgeon: Birder Robson, MD;  Location: [facility];  Service: Ophthalmology;  Laterality: Left;   CATARACT EXTRACTION W/PHACO Right 10/30/2022   Procedure: CATARACT EXTRACTION PHACO AND INTRAOCULAR LENS PLACEMENT (IOC) RIGHT  10.58  01:04.4;  Surgeon: Birder Robson, MD;  Location: [facility];  Service: Ophthalmology;  Laterality: Right;   CESAREAN SECTION     COLONOSCOPY WITH PROPOFOL N/A 09/22/2020   Procedure: COLONOSCOPY WITH PROPOFOL;  Surgeon: Lin Landsman, MD;  Location: [facility];  Service: Endoscopy;  Laterality: N/A;   NOSE SURGERY     PERIPHERAL VASCULAR CATHETERIZATION N/A 11/14/2016   Procedure: Abdominal Aortogram;  Surgeon: Wellington Hampshire, MD;  Location: [facility];  Service: Cardiovascular;  Laterality: N/A;   PERIPHERAL VASCULAR CATHETERIZATION Bilateral 11/14/2016   Procedure: Lower Extremity Angiography;  Surgeon: Wellington Hampshire, MD;  Location: [facility];  Service: Cardiovascular;  Laterality: Bilateral;  limited iliacs only   PERIPHERAL VASCULAR CATHETERIZATION Left 11/14/2016   Procedure: Peripheral Vascular Intervention;  Surgeon: Wellington Hampshire, MD;  Location: [facility];  Service: Cardiovascular;  Laterality: Left;  common iliac   PERIPHERAL VASCULAR INTERVENTION  10/05/2020   Procedure: PERIPHERAL VASCULAR INTERVENTION;  Surgeon: Wellington Hampshire, MD;  Location: [facility];  Service: Cardiovascular;;  left common iliac    PERIPHERAL VASCULAR THROMBECTOMY  10/05/2020   Procedure: PERIPHERAL VASCULAR THROMBECTOMY;  Surgeon: Wellington Hampshire, MD;  Location: [facility];  Service: Cardiovascular;;  Mechanical - Left common iliac, left internal iliac, and left SFA   POLYPECTOMY   09/22/2020   Procedure: POLYPECTOMY;  Surgeon: Lin Landsman, MD;  Location: [facility];  Service: Endoscopy;;   SKIN CANCER EXCISION     THYROID SURGERY  2007   secondary to thyroid cancer    FAMILY HISTORY: Family History  Problem Relation Age of Onset   Alzheimer's disease Father    Stroke Mother    Coronary artery disease Maternal Grandmother    Heart disease Maternal Grandmother 74   Coronary artery disease Paternal Grandmother 43   Heart disease Paternal Grandmother 75   Breast cancer Neg Hx     ADVANCED DIRECTIVES (Y/N):  N  HEALTH MAINTENANCE: Social History   Tobacco Use   Smoking status: Every Day    Packs/day: 1.00    Years: 20.00    Additional pack years: 0.00    Total pack years: 20.00    Types: Cigarettes    Last attempt to quit: 10/28/2011    Years since quitting: 11.3   Smokeless tobacco: Never  Vaping Use   Vaping Use: Former  Substance Use Topics   Alcohol use: Not Currently   Drug use: No     Colonoscopy:  PAP:  Bone density:  Lipid panel:  Allergies  Allergen Reactions   Atorvastatin Other (See Comments)    Myalgias    Darvocet [Propoxyphene N-Acetaminophen] Nausea And Vomiting   Demerol Nausea And Vomiting   Mirtazapine     Sleeplessness, nausea    Other     "  Gut Sutures"   Primidone     Severe insomnia, myoclonus and parathesias    Ambien [Zolpidem Tartrate] Palpitations    Anxiety , trembling.    Current Outpatient Medications  Medication Sig Dispense Refill   B Complex Vitamins (VITAMIN-B COMPLEX PO) Take by mouth daily.     clopidogrel (PLAVIX) 75 MG tablet TAKE 1 TABLET BY MOUTH ONCE DAILY 90 tablet 0   Colostrum 500 MG CAPS Take 500 mg by mouth daily.      Glycerin-Hypromellose-PEG 400 (DRY EYE RELIEF DROPS) 0.2-0.2-1 % SOLN Place 1 drop into both eyes daily as needed (Dry eye).     levothyroxine (SYNTHROID) 50 MCG tablet TAKE 1 TABLET BY MOUTH ONCE DAILY ON AN EMPTY STOMACH. WAIT 30 MINUTES BEFORE TAKING  OTHER MEDS 90 tablet 1   mirtazapine (REMERON) 15 MG tablet TAKE 1 TABLET BY MOUTH AT BEDTIME 90 tablet 1   propranolol (INDERAL) 10 MG tablet TAKE 1 TABLET BY MOUTH 3 TIMES DAILY AS NEEDED FOR TREMOR 90 tablet 3   rosuvastatin (CRESTOR) 40 MG tablet TAKE 1 TABLET BY MOUTH ONCE EVERY EVENING 90 tablet 3   varenicline (CHANTIX CONTINUING MONTH PAK) 1 MG tablet Take 1 tablet (1 mg total) by mouth 2 (two) times daily. 60 tablet 2   Varenicline Tartrate, Starter, (CHANTIX STARTING MONTH PAK) 0.5 MG X 11 & 1 MG X 42 TBPK Take in gradually increasing doses as directed 53 each 0   VITAMIN D PO Take by mouth daily.     naloxone (NARCAN) nasal spray 4 mg/0.1 mL as directed. (Patient not taking: Reported on 02/22/2023)     No current facility-administered medications for this visit.    OBJECTIVE: There were no vitals filed for this visit.    There is no height or weight on file to calculate BMI.    ECOG FS:0 - Asymptomatic  General: Well-developed, well-nourished, no acute distress. HEENT: Normocephalic. Neuro: Alert, answering all questions appropriately. Cranial nerves grossly intact. Psych: Normal affect.  LAB RESULTS:  Lab Results  Component Value Date   NA 138 01/25/2023   K 4.1 01/25/2023   CL 102 01/25/2023   CO2 26 01/25/2023   GLUCOSE 104 (H) 01/25/2023   BUN 15 01/25/2023   CREATININE 0.81 01/25/2023   CALCIUM 9.1 01/25/2023   PROT 8.0 01/25/2023   ALBUMIN 4.2 01/25/2023   AST 24 01/25/2023   ALT 15 01/25/2023   ALKPHOS 84 01/25/2023   BILITOT 0.6 01/25/2023   GFRNONAA >60 01/25/2023   GFRAA 109 08/30/2020    Lab Results  Component Value Date   WBC 17.1 (H) 01/25/2023   NEUTROABS 8.6 (H) 01/25/2023   HGB 15.7 (H) 01/25/2023   HCT 47.0 (H) 01/25/2023   MCV 100.4 (H) 01/25/2023   PLT 293 01/25/2023   Lab Results  Component Value Date   IRON 201 (H) 01/25/2023   TIBC 406 01/25/2023   IRONPCTSAT 50 (H) 01/25/2023   Lab Results  Component Value Date   FERRITIN  116 01/25/2023     STUDIES: No results found.   ASSESSMENT: CLL, hereditary hemochromatosis, compound heterozygote for C282Y and H63D mutations.  PLAN:    CLL: Chronic and unchanged.  Confirmed by peripheral blood flow cytometry.  Patient's total white blood cell count has ranged from 7.6-17.1 since December 2017 with a lymphocyte predominance.  Today's result is 17.1.  No intervention is needed.  Patient does not require treatment or bone marrow biopsy.  After lengthy discussion with the patient she prefers  to have her primary care physician monitor her blood counts.  Recommend checking CBC every 6 months.  Refer patient back if there are any questions or concerns. Hereditary hemochromatosis: Patient's hemoglobin is mildly increased at 15.7 with an increased total iron and saturation ratio.  Her ferritin is 116.  She does not require phlebotomy at this time, but recommend checking ferritin every 6 months along with CBC as above.  If it continues to trend up and approaches 200 please refer patient back for consideration of phlebotomy.    I provided 20 minutes of face-to-face video visit time during this encounter which included chart review, counseling, and coordination of care as documented above.   Patient expressed understanding and was in agreement with this plan. She also understands that She can call clinic at any time with any questions, concerns, or complaints.    Lloyd Huger, MD   02/22/2023 11:53 AM

## 2023-03-07 ENCOUNTER — Other Ambulatory Visit: Payer: Self-pay | Admitting: Cardiovascular Disease

## 2023-03-07 DIAGNOSIS — I739 Peripheral vascular disease, unspecified: Secondary | ICD-10-CM

## 2023-03-15 ENCOUNTER — Telehealth: Payer: Self-pay | Admitting: Internal Medicine

## 2023-03-15 ENCOUNTER — Telehealth: Payer: Self-pay | Admitting: Cardiovascular Disease

## 2023-03-15 NOTE — Telephone Encounter (Signed)
Tracie Morris is calling to get clarification on Prior Auth that they received a call on today from [facility]. She states that they are uncertain as to what this could be needed for since Dr. Kirke Corin is the one who order these tests. Please advise.

## 2023-03-15 NOTE — Telephone Encounter (Signed)
Returned call to office and machine states they are closed. Can try again Monday .

## 2023-03-15 NOTE — Telephone Encounter (Signed)
[facility] called in staying that they need PA for procedure on Monday 4/22. The rep that called said that it can be put in in pt chart if possible.

## 2023-03-15 NOTE — Telephone Encounter (Signed)
Called Dr.Arida office and ask where PA needs to go ordering physician we did  not order abdominal US.Spoke with April at [phone]. Who stated she will forward to the appropriate office.

## 2023-03-18 ENCOUNTER — Ambulatory Visit (INDEPENDENT_AMBULATORY_CARE_PROVIDER_SITE_OTHER): Payer: Medicare PPO

## 2023-03-18 ENCOUNTER — Ambulatory Visit: Payer: Medicare PPO

## 2023-03-18 DIAGNOSIS — Z95828 Presence of other vascular implants and grafts: Secondary | ICD-10-CM | POA: Diagnosis not present

## 2023-03-18 DIAGNOSIS — I739 Peripheral vascular disease, unspecified: Secondary | ICD-10-CM

## 2023-03-18 LAB — VAS US ABI WITH/WO TBI
Left ABI: 1.14
Right ABI: 1.14

## 2023-03-21 ENCOUNTER — Other Ambulatory Visit: Payer: Self-pay | Admitting: *Deleted

## 2023-03-21 DIAGNOSIS — I739 Peripheral vascular disease, unspecified: Secondary | ICD-10-CM

## 2023-03-25 NOTE — Telephone Encounter (Signed)
Reviewed the patient's chart.  Her Vascular testing was completed on 03/18/23 (LE artertail duplex/ ABI's/ Aorta/ IVC/ Iliacs).   Closing this encounter.

## 2023-03-28 ENCOUNTER — Telehealth: Payer: Self-pay | Admitting: *Deleted

## 2023-03-28 NOTE — Telephone Encounter (Signed)
Requested benefits from [employer] portal & received below response for [employer] benefits.  Sending to PA team to see if they can run a test claim for me. Pt usually pays $40 & requires a PA

## 2023-04-02 NOTE — Telephone Encounter (Signed)
Second request-pt scheduled for Prolia on 04/15/23 (see below)

## 2023-04-03 ENCOUNTER — Encounter: Payer: Self-pay | Admitting: Oncology

## 2023-04-03 ENCOUNTER — Other Ambulatory Visit (HOSPITAL_COMMUNITY): Payer: Self-pay

## 2023-04-03 NOTE — Telephone Encounter (Signed)
Pharmacy Patient Advocate Encounter  Placed a call to [facility] to complete benefit verification for Prolia.  There will be a $40 copay, no coinsurance, no deductible, prior authorization required.    Received notification from [facility] that prior authorization for Prolia is required/requested.   PA submitted on 04/03/23 to (ins) [employer] via CoverMyMeds Key # [id number] Status is pending

## 2023-04-05 ENCOUNTER — Other Ambulatory Visit (HOSPITAL_COMMUNITY): Payer: Self-pay

## 2023-04-05 NOTE — Telephone Encounter (Signed)
PA Approved

## 2023-04-05 NOTE — Telephone Encounter (Signed)
Pt ready for scheduling for Prolia on or after : 04/15/23  Out-of-pocket cost due at time of visit: $40  Primary: [employer] - Medicare Prolia co-insurance: 100% Admin fee co-insurance: $40  Secondary: N/A Prolia co-insurance:  Admin fee co-insurance:   Medical Benefit Details: Date Benefits were checked: 04/03/23 Deductible: no/ Coinsurance: 100%/ Admin Fee: $40  Prior Auth: approved PA#  [id number] Expiration Date: 02/24/22 to 11/26/23   Pharmacy benefit: Copay $64 If patient wants fill through the pharmacy benefit please send prescription to: [employer], and include estimated need by date in rx notes. Pharmacy will ship medication directly to the office.  Patient not eligible for Prolia Copay Card. Copay Card can make patient's cost as little as $25. Link to apply: https://www.amgensupportplus.com/copay  ** This summary of benefits is an estimation of the patient's out-of-pocket cost. Exact cost may very based on individual plan coverage.

## 2023-04-08 NOTE — Telephone Encounter (Signed)
Sent mychart message

## 2023-04-09 NOTE — Telephone Encounter (Signed)
Appt scheduled

## 2023-04-15 ENCOUNTER — Ambulatory Visit (INDEPENDENT_AMBULATORY_CARE_PROVIDER_SITE_OTHER): Payer: Medicare PPO

## 2023-04-15 DIAGNOSIS — M810 Age-related osteoporosis without current pathological fracture: Secondary | ICD-10-CM

## 2023-04-15 MED ORDER — DENOSUMAB 60 MG/ML ~~LOC~~ SOSY
60.0000 mg | PREFILLED_SYRINGE | Freq: Once | SUBCUTANEOUS | Status: AC
Start: 2023-04-15 — End: 2023-04-15
  Administered 2023-04-15: 60 mg via SUBCUTANEOUS

## 2023-04-15 NOTE — Progress Notes (Signed)
Pt presented for their subcutaneous Prolia injection. Pt was identified through two identifiers. Pt was given the information packets about the Prolia and told to schedule their next injection 6 months out. Pt tolerated the subq injection well in the left arm.  

## 2023-04-17 ENCOUNTER — Ambulatory Visit: Payer: Medicare PPO | Admitting: Cardiovascular Disease

## 2023-05-08 ENCOUNTER — Telehealth: Payer: Self-pay

## 2023-05-08 NOTE — Telephone Encounter (Signed)
Spoke to patient about upcoming appt 05/20/23 with Dr. Roseanne Reno.  Discussed prophylactic valtrex. Patient does not have a history of fever blisters and declines valtrex./sh

## 2023-05-20 ENCOUNTER — Ambulatory Visit (INDEPENDENT_AMBULATORY_CARE_PROVIDER_SITE_OTHER): Payer: Self-pay | Admitting: Dermatology

## 2023-05-20 VITALS — BP 134/84 | HR 72

## 2023-05-20 DIAGNOSIS — L988 Other specified disorders of the skin and subcutaneous tissue: Secondary | ICD-10-CM

## 2023-05-20 NOTE — Patient Instructions (Signed)
Due to recent changes in healthcare laws, you may see results of your pathology and/or laboratory studies on MyChart before the doctors have had a chance to review them. We understand that in some cases there may be results that are confusing or concerning to you. Please understand that not all results are received at the same time and often the doctors may need to interpret multiple results in order to provide you with the best plan of care or course of treatment. Therefore, we ask that you please give us 2 business days to thoroughly review all your results before contacting the office for clarification. Should we see a critical lab result, you will be contacted sooner.   If You Need Anything After Your Visit  If you have any questions or concerns for your doctor, please call our main line at [phone] and press option 4 to reach your doctor's medical assistant. If no one answers, please leave a voicemail as directed and we will return your call as soon as possible. Messages left after 4 pm will be answered the following business day.   You may also send us a message via MyChart. We typically respond to MyChart messages within 1-2 business days.  For prescription refills, please ask your pharmacy to contact our office. Our fax number is [phone].  If you have an urgent issue when the clinic is closed that cannot wait until the next business day, you can page your doctor at the number below.    Please note that while we do our best to be available for urgent issues outside of office hours, we are not available 24/7.   If you have an urgent issue and are unable to reach us, you may choose to seek medical care at your doctor's office, retail clinic, urgent care center, or emergency room.  If you have a medical emergency, please immediately call 911 or go to the emergency department.  Pager Numbers  - Dr. Kowalski: [phone]  - Dr. Moye: [phone]  - Dr. Stewart:  [phone]  In the event of inclement weather, please call our main line at [phone] for an update on the status of any delays or closures.  Dermatology Medication Tips: Please keep the boxes that topical medications come in in order to help keep track of the instructions about where and how to use these. Pharmacies typically print the medication instructions only on the boxes and not directly on the medication tubes.   If your medication is too expensive, please contact our office at [phone] option 4 or send us a message through MyChart.   We are unable to tell what your co-pay for medications will be in advance as this is different depending on your insurance coverage. However, we may be able to find a substitute medication at lower cost or fill out paperwork to get insurance to cover a needed medication.   If a prior authorization is required to get your medication covered by your insurance company, please allow us 1-2 business days to complete this process.  Drug prices often vary depending on where the prescription is filled and some pharmacies may offer cheaper prices.  The website www.goodrx.com contains coupons for medications through different pharmacies. The prices here do not account for what the cost may be with help from insurance (it may be cheaper with your insurance), but the website can give you the price if you did not use any insurance.  - You can print the associated coupon and take it with   your prescription to the pharmacy.  - You may also stop by our office during regular business hours and pick up a GoodRx coupon card.  - If you need your prescription sent electronically to a different pharmacy, notify our office through [place] MyChart or by phone at [phone] option 4.     Si Usted Necesita Algo Despus de Su Visita  Tambin puede enviarnos un mensaje a travs de MyChart. Por lo general respondemos a los mensajes de MyChart en el transcurso de 1 a 2  das hbiles.  Para renovar recetas, por favor pida a su farmacia que se ponga en contacto con nuestra oficina. Nuestro nmero de fax es el [phone].  Si tiene un asunto urgente cuando la clnica est cerrada y que no puede esperar hasta el siguiente da hbil, puede llamar/localizar a su doctor(a) al nmero que aparece a continuacin.   Por favor, tenga en cuenta que aunque hacemos todo lo posible para estar disponibles para asuntos urgentes fuera del horario de oficina, no estamos disponibles las 24 horas del da, los 7 das de la semana.   Si tiene un problema urgente y no puede comunicarse con nosotros, puede optar por buscar atencin mdica  en el consultorio de su doctor(a), en una clnica privada, en un centro de atencin urgente o en una sala de emergencias.  Si tiene una emergencia mdica, por favor llame inmediatamente al 911 o vaya a la sala de emergencias.  Nmeros de bper  - Dr. Kowalski: [phone]  - Dra. Moye: [phone]  - Dra. Stewart: [phone]  En caso de inclemencias del tiempo, por favor llame a nuestra lnea principal al [phone] para una actualizacin sobre el estado de cualquier retraso o cierre.  Consejos para la medicacin en dermatologa: Por favor, guarde las cajas en las que vienen los medicamentos de uso tpico para ayudarle a seguir las instrucciones sobre dnde y cmo usarlos. Las farmacias generalmente imprimen las instrucciones del medicamento slo en las cajas y no directamente en los tubos del medicamento.   Si su medicamento es muy caro, por favor, pngase en contacto con nuestra oficina llamando al [phone] y presione la opcin 4 o envenos un mensaje a travs de MyChart.   No podemos decirle cul ser su copago por los medicamentos por adelantado ya que esto es diferente dependiendo de la cobertura de su seguro. Sin embargo, es posible que podamos encontrar un medicamento sustituto a menor costo o llenar un formulario para que el  seguro cubra el medicamento que se considera necesario.   Si se requiere una autorizacin previa para que su compaa de seguros cubra su medicamento, por favor permtanos de 1 a 2 das hbiles para completar este proceso.  Los precios de los medicamentos varan con frecuencia dependiendo del lugar de dnde se surte la receta y alguna farmacias pueden ofrecer precios ms baratos.  El sitio web www.goodrx.com tiene cupones para medicamentos de diferentes farmacias. Los precios aqu no tienen en cuenta lo que podra costar con la ayuda del seguro (puede ser ms barato con su seguro), pero el sitio web puede darle el precio si no utiliz ningn seguro.  - Puede imprimir el cupn correspondiente y llevarlo con su receta a la farmacia.  - Tambin puede pasar por nuestra oficina durante el horario de atencin regular y recoger una tarjeta de cupones de GoodRx.  - Si necesita que su receta se enve electrnicamente a una farmacia diferente, informe a nuestra oficina a travs de MyChart de [place]   o por telfono llamando al [phone] y presione la opcin 4.  

## 2023-05-20 NOTE — Progress Notes (Signed)
   Follow-Up Visit   Subjective  Tracie Morris is a 67 y.o. female who presents for the following: Botox and filler for facial elastosis  The following portions of the chart were reviewed this encounter and updated as appropriate: medications, allergies, medical history  Review of Systems:  No other skin or systemic complaints except as noted in HPI or Assessment and Plan.  Objective  Well appearing patient in no apparent distress; mood and affect are within normal limits.  A focused examination was performed of the face. Relevant physical exam findings are noted in the Assessment and Plan or shown in photos.  Before photos                   Injection map photo  After photos         Assessment & Plan    Facial Elastosis  Prior to the procedure, the patient's past medical history, allergies and the rare but potential risks and complications were reviewed with the patient and a signed consent was obtained. Pre and post-treatment care was discussed and instructions provided.   Location: lips  Filler Type: Restylane refyne  Procedure: Lidocaine-tetracaine ointment was applied to treatment areas to achieve good local anesthesia. After introducing the needle into the desired treatment area, the syringe plunger was drawn back to ensure there was no flash of blood prior to injecting the filler in order to minimize risk of intravascular injection and vascular occlusion. After injection of the filler, the treated areas were cleansed and iced to reduce swelling. Post-treatment instructions were reviewed with the patient.       Patient tolerated the procedure well. The patient will call with any problems, questions or concerns prior to their next appointment.  Facial Elastosis  Location: See above attached image   Informed consent: Discussed risks (infection, pain, bleeding, bruising, swelling, allergic reaction, paralysis of nearby muscles, eyelid droop,  double vision, neck weakness, difficulty breathing, headache, undesirable cosmetic result, and need for additional treatment) and benefits of the procedure, as well as the alternatives.  Informed consent was obtained.  Preparation: The area was cleansed with alcohol.  Procedure Details:  Botox was injected into the dermis with a 30-gauge needle. Pressure applied to any bleeding. Ice packs offered for swelling.  Lot Number:  [id number] Expiration:  04/2025  Total Units Injected:  31.5  Plan: Tylenol may be used for headache.  Allow 2 weeks before returning to clinic for additional dosing as needed. Patient will call for any problems.   Return as scheduled.  ICherlyn Labella, CMA, am acting as scribe for Willeen Niece, MD .   Documentation: I have reviewed the above documentation for accuracy and completeness, and I agree with the above.  Willeen Niece, MD

## 2023-05-21 ENCOUNTER — Telehealth: Payer: Self-pay

## 2023-05-21 NOTE — Telephone Encounter (Signed)
Left message for patient to see how she was doing from treatment yesterday. Patient to call back with any questions or concerns.

## 2023-05-31 ENCOUNTER — Other Ambulatory Visit: Payer: Self-pay | Admitting: Cardiovascular Disease

## 2023-06-05 ENCOUNTER — Ambulatory Visit: Payer: Medicare PPO | Admitting: Dermatology

## 2023-06-05 VITALS — BP 134/84

## 2023-06-05 DIAGNOSIS — L814 Other melanin hyperpigmentation: Secondary | ICD-10-CM

## 2023-06-05 DIAGNOSIS — D2222 Melanocytic nevi of left ear and external auricular canal: Secondary | ICD-10-CM | POA: Diagnosis not present

## 2023-06-05 DIAGNOSIS — L570 Actinic keratosis: Secondary | ICD-10-CM

## 2023-06-05 DIAGNOSIS — W908XXA Exposure to other nonionizing radiation, initial encounter: Secondary | ICD-10-CM | POA: Diagnosis not present

## 2023-06-05 DIAGNOSIS — Z872 Personal history of diseases of the skin and subcutaneous tissue: Secondary | ICD-10-CM

## 2023-06-05 DIAGNOSIS — L988 Other specified disorders of the skin and subcutaneous tissue: Secondary | ICD-10-CM

## 2023-06-05 DIAGNOSIS — D2262 Melanocytic nevi of left upper limb, including shoulder: Secondary | ICD-10-CM | POA: Diagnosis not present

## 2023-06-05 DIAGNOSIS — Z1283 Encounter for screening for malignant neoplasm of skin: Secondary | ICD-10-CM | POA: Diagnosis not present

## 2023-06-05 DIAGNOSIS — D229 Melanocytic nevi, unspecified: Secondary | ICD-10-CM

## 2023-06-05 DIAGNOSIS — L578 Other skin changes due to chronic exposure to nonionizing radiation: Secondary | ICD-10-CM

## 2023-06-05 DIAGNOSIS — D485 Neoplasm of uncertain behavior of skin: Secondary | ICD-10-CM

## 2023-06-05 DIAGNOSIS — L821 Other seborrheic keratosis: Secondary | ICD-10-CM

## 2023-06-05 DIAGNOSIS — D2339 Other benign neoplasm of skin of other parts of face: Secondary | ICD-10-CM

## 2023-06-05 DIAGNOSIS — Z85828 Personal history of other malignant neoplasm of skin: Secondary | ICD-10-CM

## 2023-06-05 NOTE — Patient Instructions (Addendum)
Cryotherapy Aftercare  Wash gently with soap and water everyday.   Apply Vaseline and Band-Aid daily until healed.   Wound Care Instructions  Cleanse wound gently with soap and water once a day then pat dry with clean gauze. Apply a thin coat of Petrolatum (petroleum jelly, "Vaseline") over the wound (unless you have an allergy to this). We recommend that you use a new, sterile tube of Vaseline. Do not pick or remove scabs. Do not remove the yellow or white "healing tissue" from the base of the wound.  Cover the wound with fresh, clean, nonstick gauze and secure with paper tape. You may use Band-Aids in place of gauze and tape if the wound is small enough, but would recommend trimming much of the tape off as there is often too much. Sometimes Band-Aids can irritate the skin.  You should call the office for your biopsy report after 1 week if you have not already been contacted.  If you experience any problems, such as abnormal amounts of bleeding, swelling, significant bruising, significant pain, or evidence of infection, please call the office immediately.  FOR ADULT SURGERY PATIENTS: If you need something for pain relief you may take 1 extra strength Tylenol (acetaminophen) AND 2 Ibuprofen (200mg each) together every 4 hours as needed for pain. (do not take these if you are allergic to them or if you have a reason you should not take them.) Typically, you may only need pain medication for 1 to 3 days.   Melanoma ABCDEs  Melanoma is the most dangerous type of skin cancer, and is the leading cause of death from skin disease.  You are more likely to develop melanoma if you: Have light-colored skin, light-colored eyes, or red or blond hair Spend a lot of time in the sun Tan regularly, either outdoors or in a tanning bed Have had blistering sunburns, especially during childhood Have a close family member who has had a melanoma Have atypical moles or large birthmarks  Early detection of  melanoma is key since treatment is typically straightforward and cure rates are extremely high if we catch it early.   The first sign of melanoma is often a change in a mole or a new dark spot.  The ABCDE system is a way of remembering the signs of melanoma.  A for asymmetry:  The two halves do not match. B for border:  The edges of the growth are irregular. C for color:  A mixture of colors are present instead of an even brown color. D for diameter:  Melanomas are usually (but not always) greater than 6mm - the size of a pencil eraser. E for evolution:  The spot keeps changing in size, shape, and color.  Please check your skin once per month between visits. You can use a small mirror in front and a large mirror behind you to keep an eye on the back side or your body.   If you see any new or changing lesions before your next follow-up, please call to schedule a visit.  Please continue daily skin protection including broad spectrum sunscreen SPF 30+ to sun-exposed areas, reapplying every 2 hours as needed when you're outdoors.    Due to recent changes in healthcare laws, you may see results of your pathology and/or laboratory studies on MyChart before the doctors have had a chance to review them. We understand that in some cases there may be results that are confusing or concerning to you. Please understand that not all results   are received at the same time and often the doctors may need to interpret multiple results in order to provide you with the best plan of care or course of treatment. Therefore, we ask that you please give us 2 business days to thoroughly review all your results before contacting the office for clarification. Should we see a critical lab result, you will be contacted sooner.   If You Need Anything After Your Visit  If you have any questions or concerns for your doctor, please call our main line at [phone] and press option 4 to reach your doctor's medical assistant. If  no one answers, please leave a voicemail as directed and we will return your call as soon as possible. Messages left after 4 pm will be answered the following business day.   You may also send us a message via MyChart. We typically respond to MyChart messages within 1-2 business days.  For prescription refills, please ask your pharmacy to contact our office. Our fax number is [phone].  If you have an urgent issue when the clinic is closed that cannot wait until the next business day, you can page your doctor at the number below.    Please note that while we do our best to be available for urgent issues outside of office hours, we are not available 24/7.   If you have an urgent issue and are unable to reach us, you may choose to seek medical care at your doctor's office, retail clinic, urgent care center, or emergency room.  If you have a medical emergency, please immediately call 911 or go to the emergency department.  Pager Numbers  - Dr. Kowalski: [phone]  - Dr. Moye: [phone]  - Dr. Stewart: [phone]  In the event of inclement weather, please call our main line at [phone] for an update on the status of any delays or closures.  Dermatology Medication Tips: Please keep the boxes that topical medications come in in order to help keep track of the instructions about where and how to use these. Pharmacies typically print the medication instructions only on the boxes and not directly on the medication tubes.   If your medication is too expensive, please contact our office at [phone] option 4 or send us a message through MyChart.   We are unable to tell what your co-pay for medications will be in advance as this is different depending on your insurance coverage. However, we may be able to find a substitute medication at lower cost or fill out paperwork to get insurance to cover a needed medication.   If a prior authorization is required to get your medication  covered by your insurance company, please allow us 1-2 business days to complete this process.  Drug prices often vary depending on where the prescription is filled and some pharmacies may offer cheaper prices.  The website www.goodrx.com contains coupons for medications through different pharmacies. The prices here do not account for what the cost may be with help from insurance (it may be cheaper with your insurance), but the website can give you the price if you did not use any insurance.  - You can print the associated coupon and take it with your prescription to the pharmacy.  - You may also stop by our office during regular business hours and pick up a GoodRx coupon card.  - If you need your prescription sent electronically to a different pharmacy, notify our office through [place] MyChart or by phone at [phone] option   4.   

## 2023-06-05 NOTE — Progress Notes (Addendum)
Follow-Up Visit   Subjective  Tracie Morris is a 67 y.o. female who presents for the following: Skin Cancer Screening and Full Body Skin Exam  The patient presents for Total-Body Skin Exam (TBSE) for skin cancer screening and mole check. The patient has spots, moles and lesions to be evaluated, some may be new or changing and the patient may have concern these could be cancer.  Hx BCC, SCC, AK's. Patient does have a small rough spot at nose, present for about 1 year.  The following portions of the chart were reviewed this encounter and updated as appropriate: medications, allergies, medical history  Review of Systems:  No other skin or systemic complaints except as noted in HPI or Assessment and Plan.  Objective  Well appearing patient in no apparent distress; mood and affect are within normal limits.  A full examination was performed including scalp, head, eyes, ears, nose, lips, neck, chest, axillae, abdomen, back, buttocks, bilateral upper extremities, bilateral lower extremities, hands, feet, fingers, toes, fingernails, and toenails. All findings within normal limits unless otherwise noted below.   Relevant physical exam findings are noted in the Assessment and Plan.  face Rhytides and volume loss.          R nasal dorsum x 1 Erythematous thin papules/macules with gritty scale.   left anterior thigh 5 mm gray brown macule, slightly waxy SK vs Nevus r/o Atypia        Assessment & Plan   SKIN CANCER SCREENING PERFORMED TODAY.  ACTINIC DAMAGE - Chronic condition, secondary to cumulative UV/sun exposure - diffuse scaly erythematous macules with underlying dyspigmentation - Recommend daily broad spectrum sunscreen SPF 30+ to sun-exposed areas, reapply every 2 hours as needed.  - Staying in the shade or wearing long sleeves, sun glasses (UVA+UVB protection) and wide brim hats (4-inch brim around the entire circumference of the hat) are also recommended for sun  protection.  - Call for new or changing lesions.  LENTIGINES, SEBORRHEIC KERATOSES, HEMANGIOMAS - Benign normal skin lesions - Benign-appearing - Call for any changes  MELANOCYTIC NEVI - Tan-brown and/or pink-flesh-colored symmetric macules and papules - 0.2 cm med dark brown papule at Left Upper Arm   - 0.2 cm blue grey macule at Left Antihelix - Benign appearing on exam today. Stable. - Observation - Call clinic for new or changing moles - Recommend daily use of broad spectrum spf 30+ sunscreen to sun-exposed areas.   HISTORY OF BASAL CELL CARCINOMA OF THE SKIN - No evidence of recurrence today - Recommend regular full body skin exams - Recommend daily broad spectrum sunscreen SPF 30+ to sun-exposed areas, reapply every 2 hours as needed.  - Call if any new or changing lesions are noted between office visits  HISTORY OF SQUAMOUS CELL CARCINOMA OF THE SKIN - No evidence of recurrence today - Recommend regular full body skin exams - Recommend daily broad spectrum sunscreen SPF 30+ to sun-exposed areas, reapply every 2 hours as needed.  - Call if any new or changing lesions are noted between office visits  HISTORY OF PRECANCEROUS ACTINIC KERATOSIS - site(s) of PreCancerous Actinic Keratosis clear today. - these may recur and new lesions may form requiring treatment to prevent transformation into skin cancer - observe for new or changing spots and contact [facility] for appointment if occur - photoprotection with sun protective clothing; sunglasses and broad spectrum sunscreen with SPF of at least 30 + and frequent self skin exams recommended - yearly exams by a  dermatologist recommended for persons with history of PreCancerous Actinic Keratoses  BLUE NEVUS Exam: 2 mm blue papule at right zygoma  Benign-appearing. Stable compared to previous visit. Observation.  Call clinic for new or changing moles.  Recommend daily use of broad spectrum spf 30+ sunscreen to  sun-exposed areas.   SEBORRHEIC KERATOSIS Exam: Waxy tan macule with focal raised brown papules at right anterior shoulder  Benign-appearing. Stable compared to previous visit. Observation.  Call clinic for new or changing moles.  Recommend daily use of broad spectrum spf 30+ sunscreen to sun-exposed areas.   Elastosis of skin face  S/P 3 week Botox injections and filler at the lips  Patient with slight increase drooping at left side upper eyelid and lower eyebrow.  Pt does have baseline asymmetry of eyelid hooding L>R Plan to decrease to 5 units vs 7.5 at glabella, do not treat L comma May add 2.5 u 1 cm above brow at mid pupillary line to lift browL May add add 2.5 u to left lateral brow if needed  Full syringe at lips last visit.  Would cut that to 0.5-0.75 cc next visit, and using rest for perioral rhytids.  Also consider Volbella for lips, since she had significant swelling with Refyne      AK (actinic keratosis) R nasal dorsum x 1  Actinic keratoses are precancerous spots that appear secondary to cumulative UV radiation exposure/sun exposure over time. They are chronic with expected duration over 1 year. A portion of actinic keratoses will progress to squamous cell carcinoma of the skin. It is not possible to reliably predict which spots will progress to skin cancer and so treatment is recommended to prevent development of skin cancer.  Recommend daily broad spectrum sunscreen SPF 30+ to sun-exposed areas, reapply every 2 hours as needed.  Recommend staying in the shade or wearing long sleeves, sun glasses (UVA+UVB protection) and wide brim hats (4-inch brim around the entire circumference of the hat). Call for new or changing lesions.  Destruction of lesion - R nasal dorsum x 1  Destruction method: cryotherapy   Informed consent: discussed and consent obtained   Lesion destroyed using liquid nitrogen: Yes   Region frozen until ice ball extended beyond lesion: Yes    Outcome: patient tolerated procedure well with no complications   Post-procedure details: wound care instructions given   Additional details:  Prior to procedure, discussed risks of blister formation, small wound, skin dyspigmentation, or rare scar following cryotherapy. Recommend Vaseline ointment to treated areas while healing.   Neoplasm of uncertain behavior of skin left anterior thigh  Epidermal / dermal shaving  Lesion diameter (cm):  0.6 Informed consent: discussed and consent obtained   Patient was prepped and draped in usual sterile fashion: area prepped with alcohol. Anesthesia: the lesion was anesthetized in a standard fashion   Anesthetic:  1% lidocaine w/ epinephrine 1-100,000 buffered w/ 8.4% NaHCO3 Instrument used: flexible razor blade   Hemostasis achieved with: pressure, aluminum chloride and electrodesiccation   Outcome: patient tolerated procedure well   Post-procedure details: wound care instructions given   Post-procedure details comment:  Ointment and small bandage applied  Specimen 1 - Surgical pathology Differential Diagnosis: SK vs Nevus r/o Atypia  Check Margins: No 5 mm gray brown macule, slightly waxy   Skin cancer screening  Nevus  History of basal cell carcinoma (BCC) of skin  History of SCC (squamous cell carcinoma) of skin  History of actinic keratosis  Actinic skin damage  Lentigo  Seborrheic  keratosis   Benign-appearing.  Recommend observation. Discussed cryotherapy if spot(s) become irritated or inflamed.   Return in about 1 year (around 06/04/2024) for TBSE, Hx SCC, Hx BCC, Hx AK.  Anise Salvo, RMA, am acting as scribe for Willeen Niece, MD .   Documentation: I have reviewed the above documentation for accuracy and completeness, and I agree with the above.  Willeen Niece, MD

## 2023-06-12 ENCOUNTER — Telehealth: Payer: Self-pay

## 2023-06-12 NOTE — Telephone Encounter (Signed)
-----   Message from Willeen Niece sent at 06/11/2023  6:04 PM EDT ----- Skin , left anterior thigh LICHENOID KERATOSIS  Benign inflamed keratosis - please call patient

## 2023-06-12 NOTE — Telephone Encounter (Signed)
 Advised pt of bx results/sh ?

## 2023-06-17 DIAGNOSIS — Z961 Presence of intraocular lens: Secondary | ICD-10-CM | POA: Diagnosis not present

## 2023-06-17 DIAGNOSIS — H02403 Unspecified ptosis of bilateral eyelids: Secondary | ICD-10-CM | POA: Diagnosis not present

## 2023-06-17 DIAGNOSIS — H35413 Lattice degeneration of retina, bilateral: Secondary | ICD-10-CM | POA: Diagnosis not present

## 2023-06-17 DIAGNOSIS — Z0100 Encounter for examination of eyes and vision without abnormal findings: Secondary | ICD-10-CM | POA: Diagnosis not present

## 2023-06-24 ENCOUNTER — Encounter: Payer: Medicare PPO | Admitting: Dermatology

## 2023-06-26 ENCOUNTER — Encounter (INDEPENDENT_AMBULATORY_CARE_PROVIDER_SITE_OTHER): Payer: Self-pay

## 2023-06-26 ENCOUNTER — Ambulatory Visit: Payer: Medicare PPO | Admitting: Cardiovascular Disease

## 2023-07-07 DIAGNOSIS — N390 Urinary tract infection, site not specified: Secondary | ICD-10-CM | POA: Diagnosis not present

## 2023-07-07 DIAGNOSIS — R300 Dysuria: Secondary | ICD-10-CM | POA: Diagnosis not present

## 2023-07-08 ENCOUNTER — Ambulatory Visit (INDEPENDENT_AMBULATORY_CARE_PROVIDER_SITE_OTHER): Payer: Medicare PPO | Admitting: Dermatology

## 2023-07-08 DIAGNOSIS — L720 Epidermal cyst: Secondary | ICD-10-CM | POA: Diagnosis not present

## 2023-07-08 DIAGNOSIS — D485 Neoplasm of uncertain behavior of skin: Secondary | ICD-10-CM

## 2023-07-08 NOTE — Progress Notes (Signed)
   Follow-Up Visit   Subjective  Tracie Morris is a 67 y.o. female who presents for the following: Excision of cyst vs other of left labial area  The following portions of the chart were reviewed this encounter and updated as appropriate: medications, allergies, medical history  Review of Systems:  No other skin or systemic complaints except as noted in HPI or Assessment and Plan.  Objective  Well appearing patient in no apparent distress; mood and affect are within normal limits.  A focused examination was performed of the following areas: Genital area  Relevant physical exam findings are noted in the Assessment and Plan.   Left lower labia 0.5 cm firm white papule    Assessment & Plan   Neoplasm of uncertain behavior of skin Left lower labia  Skin excision  Lesion length (cm):  0.5 Lesion width (cm):  0.5 Margin per side (cm):  0.1 Total excision diameter (cm):  0.7 Informed consent: discussed and consent obtained   Timeout: patient name, date of birth, surgical site, and procedure verified   Procedure prep:  Patient was prepped and draped in usual sterile fashion Prep type:  Povidone-iodine Anesthesia: the lesion was anesthetized in a standard fashion   Anesthetic:  1% lidocaine w/ epinephrine 1-100,000 buffered w/ 8.4% NaHCO3 Instrument used comment:  15c blade Hemostasis achieved with: pressure and electrodesiccation   Outcome: patient tolerated procedure well with no complications   Post-procedure details: sterile dressing applied and wound care instructions given   Dressing type: petrolatum and pressure dressing    Specimen 1 - Surgical pathology Differential Diagnosis: Cyst vs other  Check Margins: No     Return for Follow up as scheduled.  I, Joanie Coddington, CMA, am acting as scribe for Willeen Niece, MD .   Documentation: I have reviewed the above documentation for accuracy and completeness, and I agree with the above.  Willeen Niece, MD

## 2023-07-08 NOTE — Patient Instructions (Signed)
Wound Care Instructions  On the day following your surgery, you should begin doing daily dressing changes: Remove the old dressing and discard it. Cleanse the wound gently with tap water. This may be done in the shower or by placing a wet gauze pad directly on the wound and letting it soak for several minutes. It is important to gently remove any dried blood from the wound in order to encourage healing. This may be done by gently rolling a moistened Q-tip on the dried blood. Do not pick at the wound. If the wound should start to bleed, continue cleaning the wound, then place a moist gauze pad on the wound and hold pressure for a few minutes.  Make sure you then dry the skin surrounding the wound completely or the tape will not stick to the skin. Do not use cotton balls on the wound. After the wound is clean and dry, apply the ointment gently with a Q-tip. Cut a non-stick pad to fit the size of the wound. Lay the pad flush to the wound. If the wound is draining, you may want to reinforce it with a small amount of gauze on top of the non-stick pad for a little added compression to the area. Use the tape to seal the area completely. Select from the following with respect to your individual situation: If your wound has been stitched closed: continue the above steps 1-8 at least daily until your sutures are removed. If your wound has been left open to heal: continue steps 1-8 at least daily for the first 3-4 weeks. We would like for you to take a few extra precautions for at least the next week. Sleep with your head elevated on pillows if our wound is on your head. Do not bend over or lift heavy items to reduce the chance of elevated blood pressure to the wound Do not participate in particularly strenuous activities.   Below is a list of dressing supplies you might need.  Cotton-tipped applicators - Q-tips Gauze pads (2x2 and/or 4x4) - All-Purpose Sponges Non-stick dressing material - Telfa Tape -  Paper or Hypafix New and clean tube of petroleum jelly - Vaseline    Comments on Post-Operative Period Slight swelling and redness often appear around the wound. This is normal and will disappear within several days following the surgery. The healing wound will drain a brownish-red-yellow discharge during healing. This is a normal phase of wound healing. As the wound begins to heal, the drainage may increase in amount. Again, this drainage is normal. Notify us if the drainage becomes persistently bloody, excessively swollen, or intensely painful or develops a foul odor or red streaks.  If you should experience mild discomfort during the healing phase, you may take an aspirin-free medication such as Tylenol (acetaminophen). Notify us if the discomfort is severe or persistent. Avoid alcoholic beverages when taking pain medicine.  In Case of Wound Hemorrhage A wound hemorrhage is when the bandage suddenly becomes soaked with bright red blood and flows profusely. If this happens, sit down or lie down with your head elevated. If the wound has a dressing on it, do not remove the dressing. Apply pressure to the existing gauze. If the wound is not covered, use a gauze pad to apply pressure and continue applying the pressure for 20 minutes without peeking. DO NOT COVER THE WOUND WITH A LARGE TOWEL OR WASH CLOTH. Release your hand from the wound site but do not remove the dressing. If the bleeding has stopped,  gently clean around the wound. Leave the dressing in place for 24 hours if possible. This wait time allows the blood vessels to close off so that you do not spark a new round of bleeding by disrupting the newly clotted blood vessels with an immediate dressing change. If the bleeding does not subside, continue to hold pressure. If matters are out of your control, contact an After Hours clinic or go to the Emergency Room.    Due to recent changes in healthcare laws, you may see results of your pathology  and/or laboratory studies on MyChart before the doctors have had a chance to review them. We understand that in some cases there may be results that are confusing or concerning to you. Please understand that not all results are received at the same time and often the doctors may need to interpret multiple results in order to provide you with the best plan of care or course of treatment. Therefore, we ask that you please give [place] 2 business days to thoroughly review all your results before contacting the office for clarification. Should we see a critical lab result, you will be contacted sooner.   If You Need Anything After Your Visit  If you have any questions or concerns for your doctor, please call our main line at [phone] and press option 4 to reach your doctor's medical assistant. If no one answers, please leave a voicemail as directed and we will return your call as soon as possible. Messages left after 4 pm will be answered the following business day.   You may also send [place] a message via MyChart. We typically respond to MyChart messages within 1-2 business days.  For prescription refills, please ask your pharmacy to contact our office. Our fax number is [phone].  If you have an urgent issue when the clinic is closed that cannot wait until the next business day, you can page your doctor at the number below.    Please note that while we do our best to be available for urgent issues outside of office hours, we are not available 24/7.   If you have an urgent issue and are unable to reach [place], you may choose to seek medical care at your doctor's office, retail clinic, urgent care center, or emergency room.  If you have a medical emergency, please immediately call 911 or go to the emergency department.  Pager Numbers  - Dr. Gwen Pounds: [phone]  - Dr. Roseanne Reno: [phone]  - Dr. Katrinka Blazing: [phone]   In the event of inclement weather, please call our main line at [phone]  for an update on the status of any delays or closures.  Dermatology Medication Tips: Please keep the boxes that topical medications come in in order to help keep track of the instructions about where and how to use these. Pharmacies typically print the medication instructions only on the boxes and not directly on the medication tubes.   If your medication is too expensive, please contact our office at [phone] option 4 or send [place] a message through MyChart.   We are unable to tell what your co-pay for medications will be in advance as this is different depending on your insurance coverage. However, we may be able to find a substitute medication at lower cost or fill out paperwork to get insurance to cover a needed medication.   If a prior authorization is required to get your medication covered by your insurance company, please allow [place] 1-2 business days to complete this  process.  Drug prices often vary depending on where the prescription is filled and some pharmacies may offer cheaper prices.  The website www.goodrx.com contains coupons for medications through different pharmacies. The prices here do not account for what the cost may be with help from insurance (it may be cheaper with your insurance), but the website can give you the price if you did not use any insurance.  - You can print the associated coupon and take it with your prescription to the pharmacy.  - You may also stop by our office during regular business hours and pick up a GoodRx coupon card.  - If you need your prescription sent electronically to a different pharmacy, notify our office through [facility] or by phone at [phone] option 4.     Si Usted Necesita Algo Despus de Su Visita  Tambin puede enviarnos un mensaje a travs de Clinical cytogeneticist. Por lo general respondemos a los mensajes de MyChart en el transcurso de 1 a 2 das hbiles.  Para renovar recetas, por favor pida a su farmacia que se ponga en contacto  con nuestra oficina. Annie Sable de fax es [place] [phone].  Si tiene un asunto urgente cuando la clnica est cerrada y que no puede esperar hasta el siguiente da hbil, puede llamar/localizar a su doctor(a) al nmero que aparece a continuacin.   Por favor, tenga en cuenta que aunque hacemos todo lo posible para estar disponibles para asuntos urgentes fuera del horario de [place], no estamos disponibles las 24 horas del da, los 7 [address]  [address] de la [place].   Si tiene un problema urgente y no puede comunicarse con nosotros, puede optar por buscar atencin mdica  en el consultorio de su doctor(a), en una clnica privada, en un centro de atencin urgente o en una sala de emergencias.  Si tiene Engineer, drilling, por favor llame inmediatamente al 911 o vaya a la sala de emergencias.  Nmeros de bper  - Dr. Gwen Pounds: [phone]  - Dra. Roseanne Reno: [phone]  - Dr. Katrinka Blazing: [phone]   En caso de inclemencias del tiempo, por favor llame a Lacy Duverney principal al [phone] para una actualizacin sobre el [place] de cualquier retraso o cierre.  Consejos para la medicacin en dermatologa: Por favor, guarde las cajas en las que vienen los medicamentos de uso tpico para ayudarle a seguir las instrucciones sobre dnde y cmo usarlos. Las farmacias generalmente imprimen las instrucciones del medicamento slo en las cajas y no directamente en los tubos del [place].   Si su medicamento es muy caro, por favor, pngase en contacto con Rolm Gala llamando al [phone] y presione la opcin 4 o envenos un mensaje a travs de Clinical cytogeneticist.   No podemos decirle cul ser su copago por los medicamentos por adelantado ya que esto es diferente dependiendo de la cobertura de su seguro. Sin embargo, es posible que podamos encontrar un medicamento sustituto a Audiological scientist un formulario para que el seguro cubra el medicamento que se considera necesario.   Si se requiere una autorizacin  previa para que su compaa de seguros [place] su medicamento, por favor permtanos de 1 a 2 das hbiles para completar [address]  Los precios de los medicamentos varan con frecuencia dependiendo del Environmental consultant de dnde se surte la receta y alguna farmacias pueden ofrecer precios ms baratos.  El sitio web www.goodrx.com tiene cupones para medicamentos de Health and safety inspector. Los precios aqu no tienen en cuenta lo que podra costar con la ayuda del seguro (  puede ser ms barato con su seguro), pero el sitio web puede darle el precio si no Visual merchandiser.  - Puede imprimir el cupn correspondiente y llevarlo con su receta a la farmacia.  - Tambin puede pasar por nuestra oficina durante el horario de atencin regular y Education officer, museum una tarjeta de cupones de GoodRx.  - Si necesita que su receta se enve electrnicamente a una farmacia diferente, informe a nuestra oficina a travs de MyChart de [place] o por telfono llamando al [phone] y presione la opcin 4.

## 2023-07-11 ENCOUNTER — Telehealth: Payer: Self-pay

## 2023-07-11 ENCOUNTER — Other Ambulatory Visit (INDEPENDENT_AMBULATORY_CARE_PROVIDER_SITE_OTHER): Payer: Medicare PPO

## 2023-07-11 DIAGNOSIS — R7301 Impaired fasting glucose: Secondary | ICD-10-CM | POA: Diagnosis not present

## 2023-07-11 DIAGNOSIS — E032 Hypothyroidism due to medicaments and other exogenous substances: Secondary | ICD-10-CM

## 2023-07-11 DIAGNOSIS — E7800 Pure hypercholesterolemia, unspecified: Secondary | ICD-10-CM | POA: Diagnosis not present

## 2023-07-11 DIAGNOSIS — Z79899 Other long term (current) drug therapy: Secondary | ICD-10-CM

## 2023-07-11 LAB — LIPID PANEL
Cholesterol: 125 mg/dL (ref 0–200)
HDL: 58.5 mg/dL (ref >39.00)
LDL Cholesterol: 52 mg/dL (ref 0–99)
NonHDL: 66.06
Total CHOL/HDL Ratio: 2
Triglycerides: 72 mg/dL (ref 0.0–149.0)
VLDL: 14.4 mg/dL (ref 0.0–40.0)

## 2023-07-11 LAB — COMPREHENSIVE METABOLIC PANEL
ALT: 9 U/L (ref 0–35)
AST: 18 U/L (ref 0–37)
Albumin: 4.2 g/dL (ref 3.5–5.2)
Alkaline Phosphatase: 75 U/L (ref 39–117)
BUN: 13 mg/dL (ref 6–23)
CO2: 29 mEq/L (ref 19–32)
Calcium: 9.5 mg/dL (ref 8.4–10.5)
Chloride: 103 mEq/L (ref 96–112)
Creatinine, Ser: 1.11 mg/dL (ref 0.40–1.20)
GFR: 51.25 mL/min — ABNORMAL LOW (ref >60.00)
Glucose, Bld: 108 mg/dL — ABNORMAL HIGH (ref 70–99)
Potassium: 3.8 mEq/L (ref 3.5–5.1)
Sodium: 141 mEq/L (ref 135–145)
Total Bilirubin: 0.4 mg/dL (ref 0.2–1.2)
Total Protein: 6.9 g/dL (ref 6.0–8.3)

## 2023-07-11 LAB — CBC WITH DIFFERENTIAL/PLATELET
Basophils Absolute: 0 10*3/uL (ref 0.0–0.1)
Basophils Relative: 0.3 % (ref 0.0–3.0)
Eosinophils Absolute: 0.3 10*3/uL (ref 0.0–0.7)
Eosinophils Relative: 1.9 % (ref 0.0–5.0)
HCT: 43.6 % (ref 36.0–46.0)
Hemoglobin: 14.5 g/dL (ref 12.0–15.0)
Lymphocytes Relative: 40.5 % (ref 12.0–46.0)
Lymphs Abs: 5.4 10*3/uL — ABNORMAL HIGH (ref 0.7–4.0)
MCHC: 33.4 g/dL (ref 30.0–36.0)
MCV: 103.2 fl — ABNORMAL HIGH (ref 78.0–100.0)
Monocytes Absolute: 0.7 10*3/uL (ref 0.1–1.0)
Monocytes Relative: 5.1 % (ref 3.0–12.0)
Neutro Abs: 7 10*3/uL (ref 1.4–7.7)
Neutrophils Relative %: 52.2 % (ref 43.0–77.0)
Platelets: 239 10*3/uL (ref 150.0–400.0)
RBC: 4.23 Mil/uL (ref 3.87–5.11)
RDW: 13.3 % (ref 11.5–15.5)
WBC: 13.4 10*3/uL — ABNORMAL HIGH (ref 4.0–10.5)

## 2023-07-11 LAB — HEMOGLOBIN A1C: Hgb A1c MFr Bld: 5.8 % (ref 4.6–6.5)

## 2023-07-11 LAB — TSH: TSH: 7.15 u[IU]/mL — ABNORMAL HIGH (ref 0.35–5.50)

## 2023-07-11 LAB — LDL CHOLESTEROL, DIRECT: Direct LDL: 63 mg/dL

## 2023-07-11 NOTE — Telephone Encounter (Signed)
Orders placed for lab appt 

## 2023-07-15 ENCOUNTER — Encounter: Payer: Self-pay | Admitting: Internal Medicine

## 2023-07-15 ENCOUNTER — Telehealth: Payer: Self-pay

## 2023-07-15 ENCOUNTER — Ambulatory Visit (INDEPENDENT_AMBULATORY_CARE_PROVIDER_SITE_OTHER): Payer: Medicare PPO | Admitting: Internal Medicine

## 2023-07-15 VITALS — BP 162/68 | HR 60 | Temp 98.0°F | Ht 67.0 in | Wt 109.8 lb

## 2023-07-15 DIAGNOSIS — E032 Hypothyroidism due to medicaments and other exogenous substances: Secondary | ICD-10-CM

## 2023-07-15 DIAGNOSIS — Z23 Encounter for immunization: Secondary | ICD-10-CM

## 2023-07-15 DIAGNOSIS — R030 Elevated blood-pressure reading, without diagnosis of hypertension: Secondary | ICD-10-CM | POA: Diagnosis not present

## 2023-07-15 DIAGNOSIS — R944 Abnormal results of kidney function studies: Secondary | ICD-10-CM | POA: Insufficient documentation

## 2023-07-15 DIAGNOSIS — K700 Alcoholic fatty liver: Secondary | ICD-10-CM

## 2023-07-15 DIAGNOSIS — Z124 Encounter for screening for malignant neoplasm of cervix: Secondary | ICD-10-CM

## 2023-07-15 DIAGNOSIS — D7589 Other specified diseases of blood and blood-forming organs: Secondary | ICD-10-CM

## 2023-07-15 DIAGNOSIS — G252 Other specified forms of tremor: Secondary | ICD-10-CM | POA: Diagnosis not present

## 2023-07-15 DIAGNOSIS — Z634 Disappearance and death of family member: Secondary | ICD-10-CM | POA: Insufficient documentation

## 2023-07-15 DIAGNOSIS — F4321 Adjustment disorder with depressed mood: Secondary | ICD-10-CM

## 2023-07-15 DIAGNOSIS — D72820 Lymphocytosis (symptomatic): Secondary | ICD-10-CM | POA: Diagnosis not present

## 2023-07-15 DIAGNOSIS — Z716 Tobacco abuse counseling: Secondary | ICD-10-CM

## 2023-07-15 DIAGNOSIS — E559 Vitamin D deficiency, unspecified: Secondary | ICD-10-CM | POA: Diagnosis not present

## 2023-07-15 DIAGNOSIS — Z0001 Encounter for general adult medical examination with abnormal findings: Secondary | ICD-10-CM | POA: Diagnosis not present

## 2023-07-15 DIAGNOSIS — Z1231 Encounter for screening mammogram for malignant neoplasm of breast: Secondary | ICD-10-CM

## 2023-07-15 MED ORDER — LEVOTHYROXINE SODIUM 75 MCG PO TABS: ORAL_TABLET | ORAL | 1 refills | Status: DC

## 2023-07-15 MED ORDER — PROPRANOLOL HCL 10 MG PO TABS: ORAL_TABLET | ORAL | 3 refills | Status: DC

## 2023-07-15 NOTE — Assessment & Plan Note (Signed)
continue propranolol.  No longer abusing alcohol.  Counselled against  havign the ultrasound guided thalamic procedure.

## 2023-07-15 NOTE — Assessment & Plan Note (Signed)
Suggested by last CT .   LFTS  have normalized with alcohol cessation   Lab Results  Component Value Date   ALT 9 07/11/2023   AST 18 07/11/2023   ALKPHOS 75 07/11/2023   BILITOT 0.4 07/11/2023

## 2023-07-15 NOTE — Patient Instructions (Addendum)
Your annual mammogram has been ordered AND IS DUE in October .  Tracie Morris will not allow [place] to schedule it for you,  so please  call to make your appointment [phone]    The ShingRx vaccine is now available  for free at your pharmacy and ADVISED for all interested adults over 50 to prevent shingles.  PLEASE GET YOUR FIRST DOSE THIS MONTH AND YOUR SECOND DOSE BEFORE THE END OF FEBRUARY    You do not need any more PAP smears based on guidelines, because you last one was normal at age 36 .    Please return after 6 weeks of your NEW thyroid dose for repeat tsh level    I do not recommend getting the thalamic brain procedure that is supposed to fix your tremor.      The new goals for optimal blood pressure management are 120/70. To 130/70   Please check your blood pressure a few times at home and send me the readings so I can determine if you need  to start a medication

## 2023-07-15 NOTE — Assessment & Plan Note (Signed)
She has been smoke free since April , when her husband Karren Burly passed away.  Using chantix .

## 2023-07-15 NOTE — Telephone Encounter (Signed)
Advised pt of bx results/sh ?

## 2023-07-15 NOTE — Assessment & Plan Note (Signed)
Her husband passed In April after sustaining multiple traumatic rib fractures .

## 2023-07-15 NOTE — Assessment & Plan Note (Signed)
Stable by repeat labs.  CLL per Oncology.  Continue annual oncology follow up and semi annual CBC

## 2023-07-15 NOTE — Progress Notes (Signed)
Patient ID: Tracie Morris, female    DOB: 1956/09/19  Age: 67 y.o. MRN: [id number]  The patient is here for annual preventive  examination and management of other chronic and acute problems.   The risk factors are reflected in the social history.  The roster of all physicians providing medical care to patient - is listed in the Snapshot section of the chart.  Activities of daily living:  The patient is 100% independent in all ADLs: dressing, toileting, feeding as well as independent mobility  Home safety : The patient has smoke detectors in the home. They wear seatbelts.  There are no firearms at home. There is no violence in the home.   There is no risks for hepatitis, STDs or HIV. There is no   history of blood transfusion. They have no travel history to infectious disease endemic areas of the world.  The patient has seen their dentist in the last six month. They have seen their eye doctor in the last year. They admit to slight hearing difficulty with regard to whispered voices and some television programs.  They have deferred audiologic testing in the last year.  They do not  have excessive sun exposure. Discussed the need for sun protection: hats, long sleeves and use of sunscreen if there is significant sun exposure.   Diet: the importance of a healthy diet is discussed. They do have a healthy diet.  The benefits of regular aerobic exercise were discussed. She walks 4 times per week ,  20 minutes.   Depression screen: there are no signs or vegative symptoms of depression- irritability, change in appetite, anhedonia, sadness/tearfullness.  Cognitive assessment: the patient manages all their financial and personal affairs and is actively engaged. They could relate day,date,year and events; recalled 2/3 objects at 3 minutes; performed clock-face test normally.  The following portions of the patient's history were reviewed and updated as appropriate: allergies, current medications, past  family history, past medical history,  past surgical history, past social history  and problem list.  Visual acuity was not assessed per patient preference since she has regular follow up with her ophthalmologist. Hearing and body mass index were assessed and reviewed.   During the course of the visit the patient was educated and counseled about appropriate screening and preventive services including : fall prevention , diabetes screening, nutrition counseling, colorectal cancer screening, and recommended immunizations.    CC: The primary encounter diagnosis was Cervical cancer screening. Diagnoses of Encounter for screening mammogram for malignant neoplasm of breast, Vitamin D deficiency, Macrocytosis, Iatrogenic hypothyroidism, Elevated blood pressure reading without diagnosis of hypertension, Encounter for immunization, Tobacco abuse counseling, Alcoholic fatty liver, Encounter for general adult medical examination with abnormal findings, Intention tremor, Grief, Widowed, Persistent lymphocytosis, Hereditary hemochromatosis (HCC), and Decreased glomerular filtration rate (GFR) were also pertinent to this visit.   1) Grief:  husband Karren Burly died after sustaining multiple traumatic rib fractures  in early April.  She admits that she feels a sense of relief for him due to his progressive decline in health and withdrawal from social interactions  .  She is sleeping better usng mg citrate/B6 supple,ent   2)  Hypothyroidism:  TSH was 7 on current dose of 50 mcg daily  3) decreased GFR.  Macrocytosis and leukocytosis   4) Treated for UTI 8 days ago by urgent care with macrobid which gave her diarrhea after one dose. Abx was changed to cefdinir which she started on Wednesday for 5 days .symptoms of dysuria  have partially resolved.     3) Cervical CA screening:  normal in 2020.at age 59    5) CLL :  annual follow up with . No change   6) Tobacco free with Chantix since April.     History Kelsi has a  past medical history of Arthritis, Basal cell carcinoma (03/29/2014), Basal cell carcinoma (01/18/2020), Breast mass (09/05/2020), Carotid artery occlusion, Hemochromatosis, Hyperlipidemia, Hypocalcemia, hypothyroidism (2007), Hypothyroidism, Migraine, multinodular goiter (2007), Peripheral arterial disease (HCC), PONV (postoperative nausea and vomiting), and Squamous cell carcinoma of skin (06/23/2018).   She has a past surgical history that includes Thyroid surgery (2007); Angioplasty illiac artery; Cesarean section; Nose surgery; Skin cancer excision; abdominal aortagram (N/A, 10/14/2013); Cardiac catheterization (N/A, 11/14/2016); Cardiac catheterization (Bilateral, 11/14/2016); Cardiac catheterization (Left, 11/14/2016); Colonoscopy with propofol (N/A, 09/22/2020); polypectomy (09/22/2020); ABDOMINAL AORTOGRAM W/LOWER EXTREMITY (N/A, 10/05/2020); PERIPHERAL VASCULAR INTERVENTION (10/05/2020); PERIPHERAL VASCULAR THROMBECTOMY (10/05/2020); Cataract extraction w/PHACO (Left, 10/09/2022); and Cataract extraction w/PHACO (Right, 10/30/2022).   Her family history includes Alzheimer's disease in her father; Coronary artery disease in her maternal grandmother; Coronary artery disease (age of onset: 55) in her paternal grandmother; Heart disease (age of onset: 3) in her maternal grandmother; Heart disease (age of onset: 22) in her paternal grandmother; Stroke in her mother.She reports that she quit smoking about 11 years ago. Her smoking use included cigarettes. She started smoking about 31 years ago. She has a 20 pack-year smoking history. She has never used smokeless tobacco. She reports that she does not currently use alcohol. She reports that she does not use drugs.  Outpatient Medications Prior to Visit  Medication Sig Dispense Refill   B Complex Vitamins (VITAMIN-B COMPLEX PO) Take by mouth daily.     clopidogrel (PLAVIX) 75 MG tablet TAKE 1 TABLET BY MOUTH ONCE DAILY 30 tablet 0   Colostrum 500 MG CAPS  Take 500 mg by mouth daily.      Glycerin-Hypromellose-PEG 400 (DRY EYE RELIEF DROPS) 0.2-0.2-1 % SOLN Place 1 drop into both eyes daily as needed (Dry eye).     mirtazapine (REMERON) 15 MG tablet TAKE 1 TABLET BY MOUTH AT BEDTIME 90 tablet 1   naloxone (NARCAN) nasal spray 4 mg/0.1 mL as directed.     rosuvastatin (CRESTOR) 40 MG tablet TAKE 1 TABLET BY MOUTH ONCE EVERY EVENING 90 tablet 3   varenicline (CHANTIX CONTINUING MONTH PAK) 1 MG tablet Take 1 tablet (1 mg total) by mouth 2 (two) times daily. 60 tablet 2   Varenicline Tartrate, Starter, (CHANTIX STARTING MONTH PAK) 0.5 MG X 11 & 1 MG X 42 TBPK Take in gradually increasing doses as directed 53 each 0   VITAMIN D PO Take by mouth daily.     levothyroxine (SYNTHROID) 50 MCG tablet TAKE 1 TABLET BY MOUTH ONCE DAILY ON AN EMPTY STOMACH. WAIT 30 MINUTES BEFORE TAKING OTHER MEDS 90 tablet 1   propranolol (INDERAL) 10 MG tablet TAKE 1 TABLET BY MOUTH 3 TIMES DAILY AS NEEDED FOR TREMOR 90 tablet 3   No facility-administered medications prior to visit.    Review of Systems  Patient denies headache, fevers, malaise, unintentional weight loss, skin rash, eye pain, sinus congestion and sinus pain, sore throat, dysphagia,  hemoptysis , cough, dyspnea, wheezing, chest pain, palpitations, orthopnea, edema, abdominal pain, nausea, melena, diarrhea, constipation, flank pain, dysuria, hematuria, urinary  Frequency, nocturia, numbness, tingling, seizures,  Focal weakness, Loss of consciousness, insomnia, depression, anxiety, and suicidal ideation.      Objective:  BP (!) 162/68  Pulse 60   Temp 98 F (36.7 C) (Oral)   Ht 5\' 7"  (1.702 m)   Wt 109 lb 12.8 oz (49.8 kg)   SpO2 97%   BMI 17.20 kg/m   Physical Exam Vitals reviewed.  Constitutional:      General: She is not in acute distress.    Appearance: Normal appearance. She is well-developed and normal weight. She is not ill-appearing, toxic-appearing or diaphoretic.  HENT:     Head:  Normocephalic.     Right Ear: Tympanic membrane, ear canal and external ear normal. There is no impacted cerumen.     Left Ear: Tympanic membrane, ear canal and external ear normal. There is no impacted cerumen.     Nose: Nose normal.     Mouth/Throat:     Mouth: Mucous membranes are moist.     Pharynx: Oropharynx is clear.  Eyes:     General: No scleral icterus.       Right eye: No discharge.        Left eye: No discharge.     Conjunctiva/sclera: Conjunctivae normal.     Pupils: Pupils are equal, round, and reactive to light.  Neck:     Thyroid: No thyromegaly.     Vascular: No carotid bruit or JVD.  Cardiovascular:     Rate and Rhythm: Normal rate and regular rhythm.     Heart sounds: Normal heart sounds.  Pulmonary:     Effort: Pulmonary effort is normal. No respiratory distress.     Breath sounds: Normal breath sounds.  Chest:  Breasts:    Breasts are symmetrical.     Right: Normal. No swelling, inverted nipple, mass, nipple discharge, skin change or tenderness.     Left: Normal. No swelling, inverted nipple, mass, nipple discharge, skin change or tenderness.  Abdominal:     General: Bowel sounds are normal.     Palpations: Abdomen is soft. There is no mass.     Tenderness: There is no abdominal tenderness. There is no guarding or rebound.  Musculoskeletal:        General: Normal range of motion.     Cervical back: Normal range of motion and neck supple.  Lymphadenopathy:     Cervical: No cervical adenopathy.     Upper Body:     Right upper body: No supraclavicular, axillary or pectoral adenopathy.     Left upper body: No supraclavicular, axillary or pectoral adenopathy.  Skin:    General: Skin is warm and dry.  Neurological:     General: No focal deficit present.     Mental Status: She is alert and oriented to person, place, and time. Mental status is at baseline.  Psychiatric:        Mood and Affect: Mood normal.        Behavior: Behavior normal.        Thought  Content: Thought content normal.        Judgment: Judgment normal.      Assessment & Plan:  Cervical cancer screening  Encounter for screening mammogram for malignant neoplasm of breast -     3D Screening Mammogram, Left and Right; Future  Vitamin D deficiency -     VITAMIN D 25 Hydroxy (Vit-D Deficiency, Fractures); Future  Macrocytosis -     B12 and Folate Panel; Future  Iatrogenic hypothyroidism Assessment & Plan: Patient's thyroid function is underactive on current levothyroxine dose of   50 mcg daily.  Increase to 75 mcg daily  recheck in 6 week s  Orders: -     TSH; Future  Elevated blood pressure reading without diagnosis of hypertension Assessment & Plan: She has no history of hypertension but has an elevated reading today .  She has been asked to check her pressures at home and submit readings for evaluation.  Rn visit needed in 6 weeks for BP check.  Checking urine today for proteinuria  Orders: -     Microalbumin / creatinine urine ratio  Encounter for immunization -     Pneumococcal conjugate vaccine 20-valent  Tobacco abuse counseling Assessment & Plan: She has been smoke free since April , when her husband Karren Burly passed away.  Using chantix .    Alcoholic fatty liver Assessment & Plan: Suggested by last CT .   LFTS  have normalized with alcohol cessation   Lab Results  Component Value Date   ALT 9 07/11/2023   AST 18 07/11/2023   ALKPHOS 75 07/11/2023   BILITOT 0.4 07/11/2023      Encounter for general adult medical examination with abnormal findings Assessment & Plan: age appropriate education and counseling updated, referrals for preventative services and immunizations addressed, dietary and smoking counseling addressed, most recent labs reviewed.  I have personally reviewed and have noted:   1) the patient's medical and social history 2) The pt's use of alcohol, tobacco, and illicit drugs 3) The patient's current medications and  supplements 4) Functional ability including ADL's, fall risk, home safety risk, hearing and visual impairment 5) Diet and physical activities 6) Evidence for depression or mood disorder 7) The patient's height, weight, and BMI have been recorded in the chart  I have made referrals, and provided counseling and education based on review of the above    Intention tremor Assessment & Plan: continue propranolol.  No longer abusing alcohol.  Counselled against  havign the ultrasound guided thalamic procedure.     Grief Assessment & Plan: Her husband passed In April after sustaining multiple traumatic rib fractures .    Widowed  Persistent lymphocytosis Assessment & Plan: Stable by repeat labs.  CLL per Oncology.  Continue annual oncology follow up and semi annual CBC    Hereditary hemochromatosis (HCC) Assessment & Plan: Confirmed with testing.  hgb has normalized since smoking cessation , and iron stores are normal .  Lab Results  Component Value Date   WBC 13.4 (H) 07/11/2023   HGB 14.5 07/11/2023   HCT 43.6 07/11/2023   MCV 103.2 (H) 07/11/2023   PLT 239.0 07/11/2023   Lab Results  Component Value Date   IRON 201 (H) 01/25/2023   TIBC 406 01/25/2023   FERRITIN 116 01/25/2023      Decreased glomerular filtration rate (GFR) Assessment & Plan: WILL RECHECK IN 6 WEEKS WITH TSH  Orders: -     Basic metabolic panel; Future  Other orders -     Propranolol HCl; TAKE 1 TABLET BY MOUTH 3 TIMES DAILY AS NEEDED FOR TREMOR  Dispense: 90 tablet; Refill: 3 -     Levothyroxine Sodium; TAKE 1 TABLET BY MOUTH ONCE DAILY ON AN EMPTY STOMACH. WAIT 30 MINUTES BEFORE TAKING OTHER MEDS  Dispense: 90 tablet; Refill: 1      I provided 40 minutes of  face-to-face time during this encounter reviewing patient's current problems and past surgeries,  recent labs and imaging studies, providing counseling on the above mentioned problems , and coordination  of care .   Follow-up: Return in  about 6 weeks (  around 08/26/2023).   Sherlene Shams, MD

## 2023-07-15 NOTE — Assessment & Plan Note (Signed)
Confirmed with testing.  hgb has normalized since smoking cessation , and iron stores are normal .  Lab Results  Component Value Date   WBC 13.4 (H) 07/11/2023   HGB 14.5 07/11/2023   HCT 43.6 07/11/2023   MCV 103.2 (H) 07/11/2023   PLT 239.0 07/11/2023   Lab Results  Component Value Date   IRON 201 (H) 01/25/2023   TIBC 406 01/25/2023   FERRITIN 116 01/25/2023

## 2023-07-15 NOTE — Assessment & Plan Note (Signed)
Patient's thyroid function is underactive on current levothyroxine dose of   50 mcg daily.  Increase to 75 mcg daily   recheck in 6 week s

## 2023-07-15 NOTE — Telephone Encounter (Signed)
-----   Message from Shelly sent at 07/15/2023 12:30 PM EDT ----- Diagnosis: Skin (M), left lower labia EPIDERMAL INCLUSION CYST  Please call: benign common cyst. No potential for skin cancer. Can regrow if any of the cyst wall remains after the procedure. Hopefully was fully removed and will not recur. Contact us with any concerns about healing of the affected area

## 2023-07-15 NOTE — Assessment & Plan Note (Addendum)
She has no history of hypertension but has an elevated reading today .  She has been asked to check her pressures at home and submit readings for evaluation.  Rn visit needed in 6 weeks for BP check.  Checking urine today for proteinuria

## 2023-07-15 NOTE — Assessment & Plan Note (Signed)

## 2023-07-15 NOTE — Assessment & Plan Note (Signed)
WILL RECHECK IN 6 WEEKS WITH TSH

## 2023-07-16 ENCOUNTER — Other Ambulatory Visit: Payer: Self-pay | Admitting: Internal Medicine

## 2023-07-16 LAB — MICROALBUMIN / CREATININE URINE RATIO
Creatinine,U: 217.9 mg/dL
Microalb Creat Ratio: 2.4 mg/g (ref 0.0–30.0)
Microalb, Ur: 5.3 mg/dL — ABNORMAL HIGH (ref 0.0–1.9)

## 2023-07-19 ENCOUNTER — Other Ambulatory Visit: Payer: Self-pay | Admitting: Cardiovascular Disease

## 2023-07-26 DIAGNOSIS — N390 Urinary tract infection, site not specified: Secondary | ICD-10-CM | POA: Diagnosis not present

## 2023-08-13 ENCOUNTER — Telehealth: Payer: Self-pay | Admitting: Internal Medicine

## 2023-08-13 NOTE — Telephone Encounter (Signed)
Copied from CRM [id number]. Topic: Medicare AWV >> Aug 13, 2023  3:19 PM Payton Doughty wrote: Reason for CRM: LM 08/13/2023 to schedule AWV   Verlee Rossetti; Care Guide Ambulatory Clinical Support [place] [facility] Direct Dial: [phone]

## 2023-08-26 ENCOUNTER — Ambulatory Visit (INDEPENDENT_AMBULATORY_CARE_PROVIDER_SITE_OTHER): Payer: Medicare PPO

## 2023-08-26 VITALS — BP 130/78 | HR 84

## 2023-08-26 DIAGNOSIS — D7589 Other specified diseases of blood and blood-forming organs: Secondary | ICD-10-CM | POA: Diagnosis not present

## 2023-08-26 DIAGNOSIS — R030 Elevated blood-pressure reading, without diagnosis of hypertension: Secondary | ICD-10-CM

## 2023-08-26 DIAGNOSIS — E538 Deficiency of other specified B group vitamins: Secondary | ICD-10-CM

## 2023-08-26 DIAGNOSIS — R944 Abnormal results of kidney function studies: Secondary | ICD-10-CM

## 2023-08-26 DIAGNOSIS — E559 Vitamin D deficiency, unspecified: Secondary | ICD-10-CM

## 2023-08-26 DIAGNOSIS — E032 Hypothyroidism due to medicaments and other exogenous substances: Secondary | ICD-10-CM | POA: Diagnosis not present

## 2023-08-26 LAB — BASIC METABOLIC PANEL
BUN: 18 mg/dL (ref 6–23)
CO2: 29 meq/L (ref 19–32)
Calcium: 9.3 mg/dL (ref 8.4–10.5)
Chloride: 106 meq/L (ref 96–112)
Creatinine, Ser: 0.79 mg/dL (ref 0.40–1.20)
GFR: 77 mL/min (ref >60.00)
Glucose, Bld: 91 mg/dL (ref 70–99)
Potassium: 3.9 meq/L (ref 3.5–5.1)
Sodium: 143 meq/L (ref 135–145)

## 2023-08-26 LAB — VITAMIN D 25 HYDROXY (VIT D DEFICIENCY, FRACTURES): VITD: 43.17 ng/mL (ref 30.00–100.00)

## 2023-08-26 LAB — B12 AND FOLATE PANEL: Folate: 9.9 ng/mL (ref >5.9)

## 2023-08-26 LAB — TSH: TSH: 2.03 u[IU]/mL (ref 0.35–5.50)

## 2023-08-26 NOTE — Progress Notes (Cosign Needed Addendum)
Pt presented today for BP check. Pt was identified through two identifiers.    Pt denies: chest pain, SOB, headaches, nausea, numbness, or blurred vision.  Pt informed me that on the 21st she came down with COVID and she is still getting over it.   After about 5-6 minutes of resting:  BP: 130/78 P: 84  Due to BP reading being WNL and pt not experiencing any symptoms, I let her write her at home readings down on a BP log and I walked her to the lab to get her labs drawn.   BP log has been placed in provider mailbox. ,    I have reviewed the above information and agree with above.   P at goal.  No changes to meds.   Duncan Dull, MD

## 2023-09-02 ENCOUNTER — Telehealth: Payer: Self-pay | Admitting: Internal Medicine

## 2023-09-02 ENCOUNTER — Ambulatory Visit (INDEPENDENT_AMBULATORY_CARE_PROVIDER_SITE_OTHER): Payer: Self-pay | Admitting: Dermatology

## 2023-09-02 DIAGNOSIS — L821 Other seborrheic keratosis: Secondary | ICD-10-CM

## 2023-09-02 DIAGNOSIS — R030 Elevated blood-pressure reading, without diagnosis of hypertension: Secondary | ICD-10-CM

## 2023-09-02 DIAGNOSIS — I781 Nevus, non-neoplastic: Secondary | ICD-10-CM

## 2023-09-02 DIAGNOSIS — L814 Other melanin hyperpigmentation: Secondary | ICD-10-CM

## 2023-09-02 NOTE — Patient Instructions (Signed)

## 2023-09-02 NOTE — Progress Notes (Signed)
Follow-Up Visit   Subjective  Tracie Morris is a 67 y.o. female who presents for the following: Lentigines, telangiectasias of the face, pt here today for BBL.   The patient has spots, moles and lesions to be evaluated, some may be new or changing and the patient may have concern these could be cancer.   The following portions of the chart were reviewed this encounter and updated as appropriate: medications, allergies, medical history  Review of Systems:  No other skin or systemic complaints except as noted in HPI or Assessment and Plan.  Objective  Well appearing patient in no apparent distress; mood and affect are within normal limits.   A focused examination was performed of the following areas: the face   Relevant exam findings are noted in the Assessment and Plan.  Face Scattered tan macules and broken vessels of the face.                  Assessment & Plan     Lentigines Face  With Sks and telangiectasias -   Raised flesh colored SK on R temple (see photo) treated with cryotherapy today x 1, Prior to procedure, discussed risks of blister formation, small wound, skin dyspigmentation, or rare scar following cryotherapy. Recommend Vaseline ointment to treated areas while healing.   Photorejuvenation - Face Prior to the procedure, the patient's past medical history, medications, allergies, and the rare but potential risks and complications were reviewed with the patient and a signed consent was obtained.  Pre and post treatment care was discussed and instructions provided.  Patient tolerated the procedure well.   Wynelle Link avoidance was stressed. The patient will call with any problems, questions or concerns prior to their next appointment.   [place] BBL - 09/02/23 1500      Patient Details   Current Skin Care: None    Skin Type: II    Anesthestic Cream Applied: No    Photo Takes: Yes    Consent Signed: Yes      Treatment Details   Date: 09/02/23     Treatment #: 1    Area: face    Filter: 1st Pass;2nd Pass;3rd Pass;4th Pass      1st Pass   Location: Other   Cheeks   Device: 515    BBL j/cm2: 10    PW Msec Sec: 10    Cooling Temp: 15    Pulses: 53    Rectangle adaptor   2nd Pass   Location: Other   Mid face, forehead, nose, chin, spot treated the cheeks    Device: 515    BBL j/cm2: 12    PW Msec Sec: 10    Cooling Temp: 15    Pulses: 126    Square adaptor    3rd Pass   Location: Other   cheeks, nose, chin   Device: 560    BBL j/cm2: 15    PW Msec Sec: 15    Cooling Temp: 20    Pulses: 66    Square adaptor   4th Pass   Location: Other   Nasal alar chin vessels   Device: 560    BBL j/cm2: 18    PW Msec Sec: 15    Cooling Temp: 20    Pulses: 12  11 mm circle adaptor     Return for appointment as scheduled; BBL in 4-6 weeks.  Maylene Roes, CMA, am acting as scribe for Willeen Niece, MD .   Documentation: I  have reviewed the above documentation for accuracy and completeness, and I agree with the above.  Willeen Niece, MD

## 2023-09-02 NOTE — Assessment & Plan Note (Signed)
HOME READIBGS FROM AUGUST /SEPT 2024 SUBMITTED LOW OF 118/50  HIGH OF 148/66 . ONLY 1 READING WAS > 140/90.  NO CHANGES TODAY

## 2023-09-04 NOTE — Telephone Encounter (Signed)
Home bp readings reviewed and normal

## 2023-09-05 ENCOUNTER — Ambulatory Visit: Payer: Medicare PPO | Admitting: Cardiovascular Disease

## 2023-09-09 ENCOUNTER — Other Ambulatory Visit: Payer: Self-pay | Admitting: Cardiovascular Disease

## 2023-09-10 NOTE — Telephone Encounter (Signed)
Hi Sabrina,  Will you please outreach patient to schedule past due 12 month f/u. LOV with Dr. Kirke Corin on 02/03/22.  Thanks, [employer]

## 2023-09-16 ENCOUNTER — Encounter: Payer: Self-pay | Admitting: Internal Medicine

## 2023-09-16 ENCOUNTER — Ambulatory Visit: Payer: Medicare PPO | Admitting: Internal Medicine

## 2023-09-16 VITALS — BP 142/80 | HR 69 | Ht 67.0 in | Wt 111.8 lb

## 2023-09-16 DIAGNOSIS — E890 Postprocedural hypothyroidism: Secondary | ICD-10-CM | POA: Diagnosis not present

## 2023-09-16 DIAGNOSIS — E278 Other specified disorders of adrenal gland: Secondary | ICD-10-CM

## 2023-09-16 MED ORDER — LEVOTHYROXINE SODIUM 75 MCG PO TABS: ORAL_TABLET | ORAL | 3 refills | Status: DC

## 2023-09-16 NOTE — Progress Notes (Signed)
Patient ID: Tracie Morris, female   DOB: [birth year], 67 y.o.   MRN: [id number]  HPI  Tracie Morris is a 67 y.o.-year-old female, presenting for f/u for adrenal incidentaloma and also postsurgical hypothyroidism. Last visit 1 year ago.  Interim history: No increased tremors (but is on Inderal for chronic tremors), no palpitations, no unintentional weight loss. Husband died in 2023-04-14 after falling on his chest >> multiple fractures, hematemesis.  Reviewed history: Pt's adrenal mass was incidentally found during investigation for kidney stones in 01/2015.  Abdominal CT from 06/2015: Left adrenal nodule of 9 mm measuring -11 HU.  PET scan on 08/02/2015 and this nodule was not hypermetabolic.  Chest CT (05/21/2016): I have discussed with the radiologist that read the original CT scan (Dr. Kearney Hard) >> her adrenal adenoma appears stable compared with a CT scan obtained in 2016.  Chest CT (02/13/2018): There is a stable adenoma in the left adrenal measuring 1.3 x 1.2 cm.  No h/o hypokalemia, HTN, hyperglycemia, spells of HA + HTN. She does have a h/o migraines, relieved by prn Plavix (!).   Reviewed previous investigations: urine metanephrines, normetanephrines and catecholamines were normal.  However, at last check, normetanephrine and total metanephrines were slightly elevated.  This is a nonspecific finding so we did not repeat the labs at that time.    Plasma renin activity and aldosterone levels have been normal  Dexamethasone suppression tests were normal.  Reviewed previous pertinent labs:  She had mild elevation in metanephrines and normetanephrines, which is nonspecific: Component     Latest Ref Rng & Units 02/08/2020  Epinephrine     pg/mL 42  Norepinephrine     pg/mL 744  Dopamine     pg/mL 17  Total Catecholamines     pg/mL 803  ALDOSTERONE     ** ng/dL 5  Renin Activity     7.82 - 5.82 ng/mL/h 0.21 (L)  ALDO / PRA Ratio     0.9 - 28.9 Ratio 23.8  Metanephrine,  Pl     <=57 pg/mL 65 (H)  Normetanephrine, Pl     <=148 pg/mL 150 (H)  Total Metanephrines-Plasma     <=205 pg/mL 215 (H)  Potassium     3.5 - 5.3 mmol/L 3.5   Component     Latest Ref Rng & Units 02/24/2018  Epinephrine     pg/mL 31  Norepinephrine     pg/mL 697  Dopamine     pg/mL 30  Catecholamines, Total     pg/mL 728  Metanephrine, Pl     <=57 pg/mL <25  Normetanephrine, Pl     <=148 pg/mL 197 (H)  Total Metanephrines-Plasma     <=205 pg/mL 197  ALDOSTERONE      ng/dL 5  Renin Activity     9.56 - 5.82 ng/mL/h 0.44  ALDO / PRA Ratio     0.9 - 28.9 Ratio 11.4  Potassium     3.5 - 5.1 mEq/L 3.5   Component     Latest Ref Rng & Units 06/09/2018  Cortisol - AM     mcg/dL 2.6 (L)   Component     Latest Ref Rng & Units 02/18/2017          Epinephrine      <LLN  Norepinephrine     pg/mL 558  Dopamine      <LLN  Catecholamines, Total     pg/mL 558  PRA LC/MS/MS     0.25 - 5.82 ng/mL/h 0.33  ALDO / PRA Ratio     0.9 - 28.9 Ratio 9.1  ALDOSTERONE     ng/dL 3  Metanephrine, Pl     <=57 pg/mL 46  Normetanephrine, Pl     <=148 pg/mL 170 (H)  Total Metanephrines-Plasma     <=205 pg/mL 216 (H)   Component     Latest Ref Rng & Units 06/04/2017  Cortisol - AM     mcg/dL 2.9 (L)   Component     Latest Ref Rng 08/23/2015 08/31/2015 01/16/2016 02/06/2016  Total Volume - CF 24Hr U         1400  Epinephrine, 24 hr Urine         REPORT  Norepinephrine, 24 hr Ur     15 - 100 mcg/24 h    19  Calculated Total (E+NE)     26 - 121 mcg/24 h    19 (L)  Dopamine, 24 hr Urine     52 - 480 mcg/24 h    116  Creatinine, Urine mg/day-CATEUR     0.63 - 2.50 g/24 h    0.65  Epinephrine      REPORT     Norepinephrine      534     Dopamine      REPORT     Catecholamines, Total      534     PRA LC/MS/MS     0.25 - 5.82 ng/mL/h 0.47  1.63   ALDO / PRA Ratio     0.9 - 28.9 Ratio 6.4  6.7   ALDOSTERONE      3  11   Metanephrine, Pl     <=57 pg/mL 38      Normetanephrine, Pl     <=148 pg/mL 151 (H)     Total Metanephrines-Plasma     <=205 pg/mL 189     Metanephrines, Ur     90 - 315 mcg/24 h    51 (L)  Normetanephr.,U,24h     122 - 676 mcg/24 h    128  Metanephrine, Ur     224 - 832 mcg/24 h    179 (L)  Potassium     3.5 - 5.1 mEq/L 4.0     Cortisol, Plasma       1.2 9.0    Postsurgical hypothyroidism: Patient had total thyroidectomy in 2007 for presumed thyroid cancer - Dr Chestine Spore (ENT); 1 parathyroid removed. Final pathology after thyroidectomy >> benign.   At last visit she was on levothyroxine 50 mcg daily -but currently on 75 mcg LT4 daily, dose increased 06/2023: - in am (4-6 am) - fasting - at least 30 min from b'fast - no Ca, Fe, PPIs - we moved MVIs later in the day  - not on Biotin anymore  Her TFTs are fluctuating: Lab Results  Component Value Date   TSH 2.03 08/26/2023   TSH 7.15 (H) 07/11/2023   TSH 4.76 01/14/2023   TSH 4.59 09/12/2022   TSH 5.20 07/12/2022   TSH 4.20 12/07/2021   FREET4 0.89 09/12/2022   FREET4 1.04 09/07/2021   FREET4 1.18 05/26/2021   FREET4 1.48 03/08/2021   FREET4 1.30 05/20/2020   FREET4 1.44 02/08/2020   Pt denies: - feeling nodules in neck - hoarseness - dysphagia - choking  She has hereditary hemochromatosis. She has increased LFTs. She is on propranolol for hand tremors >> helped significantly. OTW, she was about to have acranial procedure against it in case this did not work  for her. She had hiatal hernia repair (Nissen procedure). Reviewed HbA1c: Lab Results  Component Value Date   HGBA1C 5.8 07/11/2023  She has CAD, PAD, OP. In 2023, she started on Remeron for increased depression but also for increasing appetite.  This also helps with sleep. Her lowest weight was 98 pounds when she was in alcohol rehab in [place].  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     ROS: + See HPI + Insomnia - after stopping Xanax -now better with Remeron, resolved hand tremors - on  propranolol  I reviewed pt's medications, allergies, PMH, social hx, family hx, and changes were documented in the history of present illness. Otherwise, unchanged from my initial visit note.  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 Repeat angiography in November of 2014 showed 50% distal aortic stenosis extending into the left common iliac artery which had 60-70% ostial stenosis. No other significant disease. 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   R nasal tip/in situ, txt'd  with [facility]    Past Surgical History:  Procedure Laterality Date   ABDOMINAL AORTAGRAM N/A 10/14/2013   Procedure: ABDOMINAL Ronny Flurry;  Surgeon: Iran Ouch, MD;  Location: [facility] CATH [facility];  Service: Cardiovascular;  Laterality: N/A;   ABDOMINAL AORTOGRAM W/LOWER EXTREMITY N/A 10/05/2020   Procedure: ABDOMINAL AORTOGRAM W/LOWER EXTREMITY;  Surgeon: Iran Ouch, MD;  Location: [facility] INVASIVE CV [facility];  Service: Cardiovascular;  Laterality: N/A;   ANGIOPLASTY ILLIAC ARTERY     CATARACT EXTRACTION W/PHACO Left 10/09/2022   Procedure: CATARACT EXTRACTION PHACO AND INTRAOCULAR LENS PLACEMENT (IOC) LEFT 7.45 00:43.5;  Surgeon: Galen Manila, MD;  Location:  [facility];  Service: Ophthalmology;  Laterality: Left;   CATARACT EXTRACTION W/PHACO Right 10/30/2022   Procedure: CATARACT EXTRACTION PHACO AND INTRAOCULAR LENS PLACEMENT (IOC) RIGHT  10.58  01:04.4;  Surgeon: Galen Manila, MD;  Location: [facility];  Service: Ophthalmology;  Laterality: Right;   CESAREAN SECTION     COLONOSCOPY WITH PROPOFOL N/A 09/22/2020   Procedure: COLONOSCOPY WITH PROPOFOL;  Surgeon: Toney Reil, MD;  Location: [facility];  Service: Endoscopy;  Laterality: N/A;   NOSE SURGERY     PERIPHERAL VASCULAR CATHETERIZATION N/A 11/14/2016   Procedure: Abdominal Aortogram;  Surgeon: Iran Ouch, MD;  Location: [facility];  Service: Cardiovascular;  Laterality: N/A;   PERIPHERAL VASCULAR CATHETERIZATION Bilateral 11/14/2016   Procedure: Lower Extremity Angiography;  Surgeon: Iran Ouch, MD;  Location: [facility];  Service: Cardiovascular;  Laterality: Bilateral;  limited iliacs only   PERIPHERAL VASCULAR CATHETERIZATION Left 11/14/2016   Procedure: Peripheral Vascular Intervention;  Surgeon: Jerolyn Center  Argentina Donovan, MD;  Location: [facility];  Service: Cardiovascular;  Laterality: Left;  common iliac   PERIPHERAL VASCULAR INTERVENTION  10/05/2020   Procedure: PERIPHERAL VASCULAR INTERVENTION;  Surgeon: Iran Ouch, MD;  Location: [facility];  Service: Cardiovascular;;  left common iliac    PERIPHERAL VASCULAR THROMBECTOMY  10/05/2020   Procedure: PERIPHERAL VASCULAR THROMBECTOMY;  Surgeon: Iran Ouch, MD;  Location: [facility];  Service: Cardiovascular;;  Mechanical - Left common iliac, left internal iliac, and left SFA   POLYPECTOMY  09/22/2020   Procedure: POLYPECTOMY;  Surgeon: Toney Reil, MD;  Location: [facility];  Service: Endoscopy;;   SKIN CANCER EXCISION     THYROID SURGERY  2007   secondary to thyroid cancer   Social History   Social History   Marital  Status: Married    Spouse Name: N/A   Number of Children: 1   Occupational History   Production designer, theatre/television/film    Social History Main Topics   Smoking status: Former Smoker -- 1.00 packs/day for 20 years    Types: Cigarettes    Quit date: 10/28/2011   Smokeless tobacco: Never Used   Alcohol Use: No   Drug Use: No   Current Outpatient Medications on File Prior to Visit  Medication Sig Dispense Refill   B Complex Vitamins (VITAMIN-B COMPLEX PO) Take by mouth daily.     clopidogrel (PLAVIX) 75 MG tablet TAKE 1 TABLET BY MOUTH ONCE DAILY 30 tablet 0   Colostrum 500 MG CAPS Take 500 mg by mouth daily.      Glycerin-Hypromellose-PEG 400 (DRY EYE RELIEF DROPS) 0.2-0.2-1 % SOLN Place 1 drop into both eyes daily as needed (Dry eye).     levothyroxine (SYNTHROID) 75 MCG tablet TAKE 1 TABLET BY MOUTH ONCE DAILY ON AN EMPTY STOMACH. WAIT 30 MINUTES BEFORE TAKING OTHER MEDS 90 tablet 1   mirtazapine (REMERON) 15 MG tablet TAKE 1 TABLET BY MOUTH AT BEDTIME 90 tablet 1   naloxone (NARCAN) nasal spray 4 mg/0.1 mL as directed.     propranolol (INDERAL) 10 MG tablet TAKE 1 TABLET BY MOUTH 3 TIMES DAILY AS NEEDED FOR TREMOR 90 tablet 3   rosuvastatin (CRESTOR) 40 MG tablet TAKE 1 TABLET BY MOUTH ONCE EVERY EVENING 90 tablet 3   varenicline (CHANTIX CONTINUING MONTH PAK) 1 MG tablet Take 1 tablet (1 mg total) by mouth 2 (two) times daily. 60 tablet 2   Varenicline Tartrate, Starter, (CHANTIX STARTING MONTH PAK) 0.5 MG X 11 & 1 MG X 42 TBPK Take in gradually increasing doses as directed 53 each 0   VITAMIN D PO Take by mouth daily.     No current facility-administered medications on file prior to visit.   Allergies  Allergen Reactions   Atorvastatin Other (See Comments)    Myalgias    Darvocet [Propoxyphene N-Acetaminophen] Nausea And Vomiting   Demerol Nausea And Vomiting   Mirtazapine     Sleeplessness, nausea    Other     "Gut Sutures"   Primidone     Severe insomnia, myoclonus and parathesias    Ambien  [Zolpidem Tartrate] Palpitations    Anxiety , trembling.   Family History  Problem Relation Age of Onset   Alzheimer's disease Father    Stroke Mother    Coronary artery disease Maternal Grandmother    Heart disease Maternal Grandmother 88   Coronary artery disease Paternal Grandmother 18   Heart disease Paternal Grandmother 81  Breast cancer Neg Hx    PE: BP (!) 142/80   Pulse 69   Ht 5\' 7"  (1.702 m)   Wt 111 lb 12.8 oz (50.7 kg)   SpO2 99%   BMI 17.51 kg/m    Wt Readings from Last 3 Encounters:  09/16/23 111 lb 12.8 oz (50.7 kg)  07/15/23 109 lb 12.8 oz (49.8 kg)  01/25/23 118 lb 12.8 oz (53.9 kg)   Constitutional: thin, in NAD Eyes:  EOMI, no exophthalmos ENT: no neck masses, no cervical lymphadenopathy Cardiovascular: RRR, No MRG Respiratory: CTA B Musculoskeletal: no deformities Skin:no rashes Neurological: + tremor with outstretched hands  ASSESSMENT: 1. Adrenal incidentaloma  2. Postsurgical hypothyroidism  3. Presumed ThyCA I received the report of her thyroid pathology from [facility], from 02/06/2006: - Total thyroidectomy: A. Right thyroid: Nodular hyperplasia B. Left thyroid: Nodular hyperplasia with 1.9 cm dominant adenomatoid nodule. Parathyroid tissue in a single section, measuring 0.7 mm. Conclusion: There is nodular hyperplasia (multinodular goiter). There is no evidence of malignancy.   PLAN:  1. Patient with a 1 cm left adrenal nodule discovered incidentally during investigation for kidney stones -Previous hormonal work-up and imaging indicated a benign, nonsecreting, adrenal nodule -She has slightly elevated metanephrines and normetanephrine's in the past, which is nonspecific and not indicative of pheochromocytoma.  The dexamethasone suppression test was normal, ruling out Cushing syndrome.  Aldosterone and plasma renin activity were also normal, ruling out primary hyperaldosteronism -no new HTN, DM2, weight gain -Adrenal  hormones were stable over 6 years, no further investigation is needed for this  2. Hypothyroidism -Patient with history of uncontrolled hypothyroidism, with fluctuating TFTs - latest thyroid labs reviewed with pt. >> normal at last check 3 weeks ago: Lab Results  Component Value Date   TSH 2.03 08/26/2023  - she continues on LT4 75 mcg daily, increased in 06/2023, by PCP, from: 75 mcg 6/7 days and half a tablet (37.5 mcg 1/7 days)-daily average of 64 mcg daily.   - pt feels good on this dose - I refilled 1 year supply for her - we discussed about taking the thyroid hormone every day, with water, >30 minutes before breakfast, separated by >4 hours from acid reflux medications, calcium, iron, multivitamins. Pt. is taking it correctly. - we discussed that she could continue to see Dr. Darrick Huntsman for this problem, every time to see patient only as needed in the future.  She is happy with this plan, since she would not have to drive to [facility] in this case.  Carlus Pavlov, MD PhD [facility] Endocrinology

## 2023-09-16 NOTE — Patient Instructions (Addendum)
Please continue Levothyroxine 75 mcg daily.  Take the thyroid hormone every day, with water, at least 30 minutes before breakfast, separated by at least 4 hours from: - acid reflux medications - calcium - iron - multivitamins  Please continue to see Dr. Darrick Huntsman for your hypothyroidism and come back to see me as needed.

## 2023-09-17 ENCOUNTER — Ambulatory Visit
Admission: RE | Admit: 2023-09-17 | Discharge: 2023-09-17 | Disposition: A | Discharge status: Home / Self Care | Payer: Medicare PPO | Source: Ambulatory Visit | Attending: Internal Medicine | Admitting: Internal Medicine

## 2023-09-17 DIAGNOSIS — Z1231 Encounter for screening mammogram for malignant neoplasm of breast: Secondary | ICD-10-CM | POA: Insufficient documentation

## 2023-09-18 ENCOUNTER — Telehealth: Payer: Self-pay | Admitting: *Deleted

## 2023-09-18 NOTE — Telephone Encounter (Signed)
$  40 due; PA on file-good until 11/26/23  Sent mychart message to schedule Prolia  Due on or after 10/16/2023

## 2023-09-24 ENCOUNTER — Ambulatory Visit
Admission: RE | Admit: 2023-09-24 | Discharge: 2023-09-24 | Disposition: A | Discharge status: Home / Self Care | Payer: Medicare PPO | Source: Ambulatory Visit | Attending: Internal Medicine | Admitting: Internal Medicine

## 2023-09-24 DIAGNOSIS — Z87891 Personal history of nicotine dependence: Secondary | ICD-10-CM | POA: Insufficient documentation

## 2023-09-24 DIAGNOSIS — F17210 Nicotine dependence, cigarettes, uncomplicated: Secondary | ICD-10-CM | POA: Insufficient documentation

## 2023-09-24 DIAGNOSIS — Z122 Encounter for screening for malignant neoplasm of respiratory organs: Secondary | ICD-10-CM | POA: Insufficient documentation

## 2023-10-07 MED ORDER — DENOSUMAB 60 MG/ML ~~LOC~~ SOSY
60.0000 mg | PREFILLED_SYRINGE | Freq: Once | SUBCUTANEOUS | Status: AC
Start: 2023-10-16 — End: 2023-10-16
  Administered 2023-10-16: 60 mg via SUBCUTANEOUS

## 2023-10-07 NOTE — Addendum Note (Signed)
Addended by: Warden Fillers on: 10/07/2023 12:23 PM   Modules accepted: Orders

## 2023-10-15 ENCOUNTER — Ambulatory Visit (INDEPENDENT_AMBULATORY_CARE_PROVIDER_SITE_OTHER): Payer: Medicare PPO | Admitting: *Deleted

## 2023-10-15 VITALS — Ht 67.0 in | Wt 111.0 lb

## 2023-10-15 DIAGNOSIS — Z0000 Encounter for general adult medical examination without abnormal findings: Secondary | ICD-10-CM | POA: Diagnosis not present

## 2023-10-15 NOTE — Progress Notes (Signed)
Subjective:   Tracie Morris is a 67 y.o. female who presents for Medicare Annual (Subsequent) preventive examination.  Visit Complete: Virtual I connected with  Courtni Quinby on 10/15/23 by a audio enabled telemedicine application and verified that I am speaking with the correct person using two identifiers.  Patient Location: Home  Provider Location: Office/Clinic  I discussed the limitations of evaluation and management by telemedicine. The patient expressed understanding and agreed to proceed.  Vital Signs: Because this visit was a virtual/telehealth visit, some criteria may be missing or patient reported. Any vitals not documented were not able to be obtained and vitals that have been documented are patient reported.  Cardiac Risk Factors include: advanced age (>50men, >45 women);dyslipidemia;Other (see comment), Risk factor comments: CAD     Objective:    Today's Vitals   10/15/23 0903  Weight: 111 lb (50.3 kg)  Height: 5\' 7"  (1.702 m)   Body mass index is 17.39 kg/m.     10/15/2023    9:19 AM 01/25/2023    9:45 AM 10/30/2022    7:32 AM 10/09/2022   11:38 AM 02/20/2022    9:08 AM 08/29/2021   10:12 AM 10/05/2020    9:07 AM  Advanced Directives  Does Patient Have a Medical Advance Directive? Yes Yes  Yes Yes Yes Yes  Type of Estate agent of [place];Living will Healthcare Power of [place];Living will Living will Healthcare Power of [place];Living will Healthcare Power of [place];Living will Healthcare Power of [place];Living will Healthcare Power of [place];Living will  Does patient want to make changes to medical advance directive?    No - Patient declined No - Patient declined  No - Patient declined  Copy of Healthcare Power of Attorney in Chart? No - copy requested  No - copy requested No - copy requested No - copy requested  No - copy requested    Current Medications (verified) Outpatient Encounter Medications as of 10/15/2023   Medication Sig   clopidogrel (PLAVIX) 75 MG tablet TAKE 1 TABLET BY MOUTH ONCE DAILY   Colostrum 500 MG CAPS Take 500 mg by mouth daily.    Glycerin-Hypromellose-PEG 400 (DRY EYE RELIEF DROPS) 0.2-0.2-1 % SOLN Place 1 drop into both eyes daily as needed (Dry eye).   levothyroxine (SYNTHROID) 75 MCG tablet TAKE 1 TABLET BY MOUTH ONCE DAILY ON AN EMPTY STOMACH. WAIT 30 MINUTES BEFORE TAKING OTHER MEDS   mirtazapine (REMERON) 15 MG tablet TAKE 1 TABLET BY MOUTH AT BEDTIME   propranolol (INDERAL) 10 MG tablet TAKE 1 TABLET BY MOUTH 3 TIMES DAILY AS NEEDED FOR TREMOR   rosuvastatin (CRESTOR) 40 MG tablet TAKE 1 TABLET BY MOUTH ONCE EVERY EVENING   VITAMIN D PO Take by mouth daily.   naloxone (NARCAN) nasal spray 4 mg/0.1 mL as directed. (Patient not taking: Reported on 10/15/2023)   varenicline (CHANTIX CONTINUING MONTH PAK) 1 MG tablet Take 1 tablet (1 mg total) by mouth 2 (two) times daily. (Patient not taking: Reported on 10/15/2023)   Varenicline Tartrate, Starter, (CHANTIX STARTING MONTH PAK) 0.5 MG X 11 & 1 MG X 42 TBPK Take in gradually increasing doses as directed (Patient not taking: Reported on 10/15/2023)   Facility-Administered Encounter Medications as of 10/15/2023  Medication   [START ON 10/16/2023] denosumab (PROLIA) injection 60 mg    Allergies (verified) Atorvastatin, Darvocet [propoxyphene n-acetaminophen], Demerol, Mirtazapine, Other, Primidone, and Ambien [zolpidem tartrate]   History: Past Medical History:  Diagnosis Date   Arthritis    Basal  cell carcinoma 03/29/2014   L lower abdomen   Basal cell carcinoma 01/18/2020   central nasal tip, [facility] at [facility]   Breast mass 09/05/2020   right breast mass 10 oclock   Carotid artery occlusion    Hemochromatosis    Hyperlipidemia    Hypocalcemia    hypothyroidism 2007   acquired, secondary to surgery for thyroid Ca   Hypothyroidism    Migraine    improved since thyroid surgery, 2 years   multinodular goiter 2007   s/p  thyroidectomy   Peripheral arterial disease (HCC)    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 Repeat angiography in November of 2014 showed 50% distal aortic stenosis extending into the left common iliac artery which had 60-70% ostial stenosis. No other significant disease. 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   R nasal tip/in situ, txt'd  with [facility]    Past Surgical History:  Procedure Laterality Date   ABDOMINAL AORTAGRAM N/A 10/14/2013   Procedure: ABDOMINAL Ronny Flurry;  Surgeon: Iran Ouch, MD;  Location: [facility] CATH [facility];  Service: Cardiovascular;  Laterality: N/A;   ABDOMINAL AORTOGRAM W/LOWER EXTREMITY N/A 10/05/2020   Procedure: ABDOMINAL AORTOGRAM W/LOWER EXTREMITY;  Surgeon: Iran Ouch, MD;  Location: [facility] INVASIVE CV LAB;  Service: Cardiovascular;  Laterality: N/A;   ANGIOPLASTY ILLIAC ARTERY     CATARACT EXTRACTION W/PHACO Left 10/09/2022   Procedure: CATARACT EXTRACTION PHACO AND INTRAOCULAR LENS PLACEMENT (IOC) LEFT 7.45 00:43.5;  Surgeon: Galen Manila, MD;  Location: [facility];  Service: Ophthalmology;  Laterality: Left;   CATARACT EXTRACTION W/PHACO Right 10/30/2022   Procedure: CATARACT EXTRACTION PHACO AND INTRAOCULAR LENS PLACEMENT (IOC) RIGHT  10.58  01:04.4;  Surgeon: Galen Manila, MD;  Location: [facility];  Service: Ophthalmology;  Laterality: Right;   CESAREAN SECTION     COLONOSCOPY WITH PROPOFOL N/A 09/22/2020   Procedure: COLONOSCOPY WITH PROPOFOL;  Surgeon: Toney Reil, MD;  Location: [facility];  Service: Endoscopy;  Laterality: N/A;   NOSE SURGERY     PERIPHERAL VASCULAR CATHETERIZATION N/A 11/14/2016   Procedure: Abdominal Aortogram;  Surgeon: Iran Ouch, MD;  [facility]: [facility] IN[facility]  CV [facility];  Service: Cardiovascular;  Laterality: N/A;   PERIPHERAL VASCULAR CATHETERIZATION Bilateral 11/14/2016   Procedure: Lower Extremity Angiography;  Surgeon: Iran Ouch, MD;  Location: [facility];  Service: Cardiovascular;  Laterality: Bilateral;  limited iliacs only   PERIPHERAL VASCULAR CATHETERIZATION Left 11/14/2016   Procedure: Peripheral Vascular Intervention;  Surgeon: Iran Ouch, MD;  [facility]: [facility];  Service: Cardiovascular;  Laterality: Left;  common iliac   PERIPHERAL VASCULAR INTERVENTION  10/05/2020   Procedure: PERIPHERAL VASCULAR INTERVENTION;  Surgeon: Iran Ouch, MD;  [facility]: [facility];  Service: Cardiovascular;;  left common iliac    PERIPHERAL VASCULAR THROMBECTOMY  10/05/2020   Procedure: PERIPHERAL VASCULAR THROMBECTOMY;  Surgeon: Iran Ouch, MD;  [facility]: [facility];  Service: Cardiovascular;;  Mechanical - Left common iliac, left internal iliac, and left SFA   POLYPECTOMY  09/22/2020   Procedure: POLYPECTOMY;  Surgeon: Toney Reil, MD;  Location: [facility];  Service: Endoscopy;;   SKIN CANCER EXCISION     THYROID SURGERY  2007   secondary to thyroid cancer  Family History  Problem Relation Age of Onset   Alzheimer's disease Father    Stroke Mother    Coronary artery disease Maternal Grandmother    Heart disease Maternal Grandmother 23   Coronary artery disease Paternal Grandmother 78   Heart disease Paternal Grandmother 17   Breast cancer Neg Hx    Social History   Socioeconomic History   Marital status: Married    Spouse name: Not on file   Number of children: Not on file   Years of education: Not on file   Highest education level: Not on file  Occupational History   Not on file  Tobacco Use   Smoking status: Former    Average packs/day: 1 pack/day for 20.0 years (20.0 ttl pk-yrs)    Types: Cigarettes    Start date: 10/28/1991    Quit date: 10/28/2011    Years  since quitting: 11.9   Smokeless tobacco: Never   Tobacco comments:    Using Chantix since April 2024  Vaping Use   Vaping status: Former  Substance and Sexual Activity   Alcohol use: Not Currently   Drug use: No   Sexual activity: Not on file  Other Topics Concern   Not on file  Social History Narrative   Not on file   Social Determinants of Health   Financial Resource Strain: Low Risk  (10/15/2023)   Overall Financial Resource Strain (CARDIA)    Difficulty of Paying Living Expenses: Not hard at all  Food Insecurity: No Food Insecurity (10/15/2023)   Hunger Vital Sign    Worried About Running Out of Food in the Last Year: Never true    Ran Out of Food in the Last Year: Never true  Transportation Needs: No Transportation Needs (10/15/2023)   PRAPARE - Administrator, Civil Service (Medical): No    Lack of Transportation (Non-Medical): No  Physical Activity: Inactive (10/15/2023)   Exercise Vital Sign    Days of Exercise per Week: 0 days    Minutes of Exercise per Session: 0 min  Stress: No Stress Concern Present (10/15/2023)   [employer] of Occupational Health - Occupational Stress Questionnaire    Feeling of Stress : Only a little  Social Connections: Moderately Isolated (10/15/2023)   Social Connection and Isolation Panel [NHANES]    Frequency of Communication with Friends and Family: More than three times a week    Frequency of Social Gatherings with Friends and Family: More than three times a week    Attends Religious Services: More than 4 times per year    Active Member of [employer] or Organizations: No    Attends Banker Meetings: Never    Marital Status: Widowed    Tobacco Counseling Counseling given: Not Answered Tobacco comments: Using Chantix since April 2024   Clinical Intake:  Pre-visit preparation completed: Yes  Pain : No/denies pain     BMI - recorded: 17.39 Nutritional Status: BMI <19  Underweight Nutritional  Risks: None Diabetes: No  How often do you need to have someone help you when you read instructions, pamphlets, or other written materials from your doctor or pharmacy?: 1 - Never  Interpreter Needed?: No  Information entered by :: R. Tunisia Landgrebe LPN   Activities of Daily Living    10/15/2023    9:06 AM 10/30/2022    7:31 AM  In your present state of health, do you have any difficulty performing the following activities:  Hearing? 0 0  Vision? 0 0  Comment readers   Difficulty concentrating or making decisions? 1 0  Walking or climbing stairs? 0 0  Dressing or bathing? 0 0  Doing errands, shopping? 0   Preparing Food and eating ? N   Using the Toilet? N   In the past six months, have you accidently leaked urine? N   Do you have problems with loss of bowel control? N   Managing your Medications? N   Managing your Finances? N   Housekeeping or managing your Housekeeping? N     Patient Care Team: Sherlene Shams, MD as PCP - General (Internal Medicine) Iran Ouch, MD as PCP - Cardiology (Cardiology) Sherlene Shams, MD (Internal Medicine)  Indicate any recent Medical Services you may have received from other than [facility] providers in the past year (date may be approximate).     Assessment:   This is a routine wellness examination for Tracie Morris.  Hearing/Vision screen Hearing Screening - Comments:: No issues Vision Screening - Comments:: readers   Goals Addressed             This Visit's Progress    Patient Stated       Wants to start back going to the GYM       Depression Screen    10/15/2023    9:13 AM 07/15/2023   10:36 AM 01/14/2023   10:32 AM 07/12/2022    9:34 AM 02/20/2022    9:07 AM 12/07/2021   12:20 PM 09/06/2021    3:43 PM  PHQ 2/9 Scores  PHQ - 2 Score 0 0 0 0 0 1 0  PHQ- 9 Score 1  0 0  5 0    Fall Risk    10/15/2023    9:09 AM 07/15/2023   10:36 AM 01/14/2023   10:32 AM 07/12/2022    9:35 AM 02/20/2022    9:10 AM  Fall Risk   Falls in the  past year? 0 0 0 0 0  Number falls in past yr: 0 0 0  0  Injury with Fall? 0 0 0    Risk for fall due to : No Fall Risks No Fall Risks No Fall Risks No Fall Risks   Follow up Falls prevention discussed;Falls evaluation completed Falls evaluation completed Falls evaluation completed Falls evaluation completed Falls evaluation completed    MEDICARE RISK AT HOME: Medicare Risk at Home Any stairs in or around the home?: Yes If so, are there any without handrails?: No Home free of loose throw rugs in walkways, pet beds, electrical cords, etc?: Yes Adequate lighting in your home to reduce risk of falls?: Yes Life alert?: No Use of a cane, walker or w/c?: No Grab bars in the bathroom?: Yes Shower chair or bench in shower?: No Elevated toilet seat or a handicapped toilet?: Yes   Cognitive Function:        10/15/2023    9:20 AM  6CIT Screen  What Year? 0 points  What month? 0 points  What time? 0 points  Count back from 20 0 points  Months in reverse 0 points  Repeat phrase 2 points  Total Score 2 points    Immunizations Immunization History  Administered Date(s) Administered   Fluad Quad(high Dose 65+) 09/06/2021   Influenza Split 10/19/2011, 09/10/2013, 10/07/2015   Influenza, Quadrivalent, Recombinant, Inj, Pf 10/12/2019   Influenza,inj,Quad PF,6+ Mos 10/05/2016, 08/18/2018   Influenza,inj,quad, With Preservative 10/07/2017   Influenza-Unspecified 09/26/2012, 07/27/2014, 09/30/2017   Janssen (J&J) SARS-COV-2 Vaccination 03/04/2020, 11/15/2020  PNEUMOCOCCAL CONJUGATE-20 07/15/2023   Pneumococcal Conjugate-13 12/14/2015   Pneumococcal Polysaccharide-23 08/26/2009   Tdap 01/11/2014   Zoster, Live 12/06/2016    TDAP status: Up to date  Flu Vaccine status: Up to date  Pneumococcal vaccine status: Up to date  Covid-19 vaccine status: Declined, Education has been provided regarding the importance of this vaccine but patient still declined. Advised may receive this  vaccine at local pharmacy or Health Dept.or vaccine clinic. Aware to provide a copy of the vaccination record if obtained from local pharmacy or Health Dept. Verbalized acceptance and understanding.  Qualifies for Shingles Vaccine? Yes   Zostavax completed Yes   Shingrix Completed?: No.    Education has been provided regarding the importance of this vaccine. Patient has been advised to call insurance company to determine out of pocket expense if they have not yet received this vaccine. Advised may also receive vaccine at local pharmacy or Health Dept. Verbalized acceptance and understanding.  Screening Tests Health Maintenance  Topic Date Due   Zoster Vaccines- Shingrix (1 of 2) 06/29/1974   COVID-19 Vaccine (3 - 2023-24 season) 07/28/2023   Lung Cancer Screening  09/20/2023   INFLUENZA VACCINE  02/24/2024 (Originally 06/27/2023)   Medicare Annual Wellness (AWV)  08/25/2024 (Originally 02/21/2023)   DTaP/Tdap/Td (2 - Td or Tdap) 01/12/2024   MAMMOGRAM  09/16/2024   Colonoscopy  09/22/2025   Pneumonia Vaccine 52+ Years old  Completed   DEXA SCAN  Completed   Hepatitis C Screening  Completed   HPV VACCINES  Aged Out    Health Maintenance  Health Maintenance Due  Topic Date Due   Zoster Vaccines- Shingrix (1 of 2) 06/29/1974   COVID-19 Vaccine (3 - 2023-24 season) 07/28/2023   Lung Cancer Screening  09/20/2023    Colorectal cancer screening: Type of screening: Colonoscopy. Completed 08/2020. Repeat every 5 years  Mammogram status: Completed 08/2023. Repeat every year  Bone Density status: Completed 05/2021. Results reflect: Bone density results: OSTEOPOROSIS. Repeat every 2 years. On Prolia will discuss with PCP  Lung Cancer Screening: (Low Dose CT Chest recommended if Age 21-80 years, 20 pack-year currently smoking OR have quit w/in 15years.) does qualify.  Patient had CT 09/24/23   Additional Screening:  Hepatitis C Screening: does qualify; Completed 11/2018  Vision Screening:  Recommended annual ophthalmology exams for early detection of glaucoma and other disorders of the eye. Is the patient up to date with their annual eye exam?  Yes  Who is the provider or what is the name of the office in which the patient attends annual eye exams?  Eye If pt is not established with a provider, would they like to be referred to a provider to establish care? No .   Dental Screening: Recommended annual dental exams for proper oral hygiene    Community Resource Referral / Chronic Care Management: CRR required this visit?  No   CCM required this visit?  No     Plan:     I have personally reviewed and noted the following in the patient's chart:   Medical and social history Use of alcohol, tobacco or illicit drugs  Current medications and supplements including opioid prescriptions. Patient is not currently taking opioid prescriptions. Functional ability and status Nutritional status Physical activity Advanced directives List of other physicians Hospitalizations, surgeries, and ER visits in previous 12 months Vitals Screenings to include cognitive, depression, and falls Referrals and appointments  In addition, I have reviewed and discussed with patient certain preventive protocols, quality metrics,  and best practice recommendations. A written personalized care plan for preventive services as well as general preventive health recommendations were provided to patient.     Sydell Axon, LPN   [id number]   After Visit Summary: (MyChart) Due to this being a telephonic visit, the after visit summary with patients personalized plan was offered to patient via MyChart   Nurse Notes: None

## 2023-10-15 NOTE — Patient Instructions (Signed)
Ms. Holts , Thank you for taking time to come for your Medicare Wellness Visit. I appreciate your ongoing commitment to your health goals. Please review the following plan we discussed and let me know if I can assist you in the future.   Referrals/Orders/Follow-Ups/Clinician Recommendations: Consider updating your vaccines  This is a list of the screening recommended for you and due dates:  Health Maintenance  Topic Date Due   Zoster (Shingles) Vaccine (1 of 2) 06/29/1974   COVID-19 Vaccine (3 - 2023-24 season) 07/28/2023   Screening for Lung Cancer  09/20/2023   DTaP/Tdap/Td vaccine (2 - Td or Tdap) 01/12/2024   Mammogram  09/16/2024   Medicare Annual Wellness Visit  10/14/2024   Colon Cancer Screening  09/22/2025   Pneumonia Vaccine  Completed   Flu Shot  Completed   DEXA scan (bone density measurement)  Completed   Hepatitis C Screening  Completed   HPV Vaccine  Aged Out    Advanced directives: (Copy Requested) Please bring a copy of your health care power of attorney and living will to the office to be added to your chart at your convenience.  Next Medicare Annual Wellness Visit scheduled for next year: Yes 10/16/24 @ 9:30

## 2023-10-16 ENCOUNTER — Ambulatory Visit (INDEPENDENT_AMBULATORY_CARE_PROVIDER_SITE_OTHER): Payer: Medicare PPO

## 2023-10-16 DIAGNOSIS — M810 Age-related osteoporosis without current pathological fracture: Secondary | ICD-10-CM

## 2023-10-16 DIAGNOSIS — M47812 Spondylosis without myelopathy or radiculopathy, cervical region: Secondary | ICD-10-CM

## 2023-10-16 MED ORDER — DENOSUMAB 60 MG/ML ~~LOC~~ SOSY
60.0000 mg | PREFILLED_SYRINGE | Freq: Once | SUBCUTANEOUS | Status: AC
Start: 2024-04-14 — End: 2024-04-15
  Administered 2024-04-15: 60 mg via SUBCUTANEOUS

## 2023-10-16 NOTE — Progress Notes (Signed)
Patient presented for Prolia to left arm, patient voiced no concerns nor showed any signs of distress during injection

## 2023-10-21 ENCOUNTER — Ambulatory Visit (INDEPENDENT_AMBULATORY_CARE_PROVIDER_SITE_OTHER): Payer: Medicare PPO | Admitting: Dermatology

## 2023-10-21 DIAGNOSIS — L309 Dermatitis, unspecified: Secondary | ICD-10-CM

## 2023-10-21 DIAGNOSIS — I781 Nevus, non-neoplastic: Secondary | ICD-10-CM

## 2023-10-21 DIAGNOSIS — L814 Other melanin hyperpigmentation: Secondary | ICD-10-CM

## 2023-10-21 NOTE — Patient Instructions (Signed)

## 2023-10-21 NOTE — Progress Notes (Signed)
Follow-Up Visit   Subjective  Tracie Morris is a 67 y.o. female who presents for the following: Lentigines and telangiectasias of the face. Patient here for 2nd BBL treatment.   The following portions of the chart were reviewed this encounter and updated as appropriate: medications, allergies, medical history  Review of Systems:  No other skin or systemic complaints except as noted in HPI or Assessment and Plan.  Objective  Well appearing patient in no apparent distress; mood and affect are within normal limits.  A focused examination was performed of the following areas: Face  Relevant physical exam findings are noted in the Assessment and Plan.  face Scattered tan macules.        face Dilated blood vessels.        Assessment & Plan   Lentigines face  Photorejuvenation - face Prior to the procedure, the patient's past medical history, medications, allergies, and the rare but potential risks and complications were reviewed with the patient and a signed consent was obtained.  Pre and post treatment care was discussed and instructions provided.   [place] BBL - 10/21/23 1600      Patient Details   Skin Type: II    Anesthestic Cream Applied: No    Photo Takes: No    Consent Signed: Yes    Improvement from Previous Treatment: Yes      Treatment Details   Date: 10/21/23    Treatment #: 2    Area: face    Filter: 1st Pass; 2nd Pass; 3rd Pass      1st Pass   Location: F    Device: 515 Filter    BBL j/cm2: 10    PW Msec Sec: 10    Cooling Temp: 15    Pulses: 89    15x45: This crystal used      2nd Pass   Location: Other    Device: 515 Filter    BBL j/cm2: 12    PW Msec Sec: 5    Cooling Temp: 20    Pulses: 109    11mm: This crystal used.     Patient tolerated the procedure well.   Wynelle Link avoidance was stressed. The patient will call with any problems, questions or concerns prior to their next  appointment.    Telangiectasias face  Photorejuvenation - face Prior to the procedure, the patient's past medical history, medications, allergies, and the rare but potential risks and complications were reviewed with the patient and a signed consent was obtained.  Pre and post treatment care was discussed and instructions provided.   [place] BBL - 10/21/23 1600      Patient Details   Skin Type: II    Anesthestic Cream Applied: No    Photo Takes: No    Consent Signed: Yes    Improvement from Previous Treatment: Yes      Treatment Details   Date: 10/21/23    Treatment #: 2    Area: face    Filter: 1st Pass; 2nd Pass; 3rd Pass       3rd Pass   Location: F    Device: 560 Filter    BBL j/cm2: 20    PW Msec Sec: 15    Cooling Temp: 20    Pulses: 25    11mm: This crystal used.          Patient tolerated the procedure well.   Wynelle Link avoidance was stressed. The patient will call with any problems, questions or concerns prior  to their next appointment.    Dermatitis vs ISKs vs AKs  Exam: Scaly pink macules on the right > left malar cheek  Treatment Plan: Start Opzelura Cream Apply to AA bil cheeks twice daily. Sample given. Recheck on follow-up. If not improved, discussed cryotherapy vs 5FU/Calcipotriene cream.     Return in about 6 weeks (around 12/02/2023) for recheck right and left malar cheek.  ICherlyn Labella, CMA, am acting as scribe for Willeen Niece, MD .   Documentation: I have reviewed the above documentation for accuracy and completeness, and I agree with the above.  Willeen Niece, MD

## 2023-10-28 ENCOUNTER — Other Ambulatory Visit: Payer: Self-pay | Admitting: Acute Care

## 2023-10-28 DIAGNOSIS — Z122 Encounter for screening for malignant neoplasm of respiratory organs: Secondary | ICD-10-CM

## 2023-10-28 DIAGNOSIS — Z87891 Personal history of nicotine dependence: Secondary | ICD-10-CM

## 2023-11-08 ENCOUNTER — Other Ambulatory Visit: Payer: Self-pay | Admitting: Cardiovascular Disease

## 2023-11-08 NOTE — Telephone Encounter (Signed)
Hi,  Will you please outreach patient to schedule OD 12 month follow up. LOV [id number].  Thanks,  [employer]

## 2023-12-02 ENCOUNTER — Ambulatory Visit: Payer: Medicare PPO | Admitting: Dermatology

## 2023-12-06 ENCOUNTER — Ambulatory Visit: Payer: Medicare PPO | Admitting: Cardiovascular Disease

## 2024-01-04 ENCOUNTER — Other Ambulatory Visit: Payer: Self-pay | Admitting: Cardiovascular Disease

## 2024-01-09 ENCOUNTER — Other Ambulatory Visit: Payer: Self-pay | Admitting: Internal Medicine

## 2024-01-14 ENCOUNTER — Ambulatory Visit: Payer: Medicare PPO | Attending: Cardiovascular Disease | Admitting: Cardiovascular Disease

## 2024-01-14 ENCOUNTER — Encounter: Payer: Self-pay | Admitting: Cardiovascular Disease

## 2024-01-14 VITALS — BP 136/88 | HR 77 | Ht 67.5 in | Wt 120.0 lb

## 2024-01-14 DIAGNOSIS — I779 Disorder of arteries and arterioles, unspecified: Secondary | ICD-10-CM | POA: Diagnosis not present

## 2024-01-14 DIAGNOSIS — I2510 Atherosclerotic heart disease of native coronary artery without angina pectoris: Secondary | ICD-10-CM | POA: Diagnosis not present

## 2024-01-14 DIAGNOSIS — E785 Hyperlipidemia, unspecified: Secondary | ICD-10-CM | POA: Diagnosis not present

## 2024-01-14 DIAGNOSIS — I739 Peripheral vascular disease, unspecified: Secondary | ICD-10-CM | POA: Diagnosis not present

## 2024-01-14 DIAGNOSIS — Z720 Tobacco use: Secondary | ICD-10-CM

## 2024-01-14 NOTE — Progress Notes (Signed)
 Cardiology Office Note   Date:  01/14/2024   ID:  Tracie Morris, Tracie Morris [birth year], MRN [id number]  PCP:  Sherlene Shams, MD  Cardiologist:   [name], MD   No chief complaint on file.     History of Present Illness: Tracie Morris is a 68 y.o. female who presents for a followup visit regarding peripheral arterial disease.  She is status post left common iliac artery stent placement in November of 2014 for significant claudication with subsequent covered stent placement in 2017 for in situ thrombosis with subtotal occlusion and recurrent stent thrombosis in the setting of interruption of clopidogrel most recently in November 2021 which required thrombectomy and stent placement.  She has known history of hereditary hemochromatosis which likely contributes to hypercoagulable state. Other Medical history include  tobacco use and hyperlipidemia   Previous CT scan of the chest did show calcifications in the coronary arteries. 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  She has history of intermittent tobacco use but quit with Chantix.    Her husband died last year.  She has been doing well with no chest pain, shortness of breath or claudication.   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 Repeat angiography in November of 2014 showed 50%  distal aortic stenosis extending into the left common iliac artery which had 60-70% ostial stenosis. No other significant disease. 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   R nasal tip/in situ, txt'd  with [facility]     Past Surgical History:  Procedure Laterality Date   ABDOMINAL AORTAGRAM N/A 10/14/2013   Procedure: ABDOMINAL Ronny Flurry;  Surgeon: Iran Ouch, MD;  Location: [facility] CATH [facility];  Service: Cardiovascular;  Laterality: N/A;   ABDOMINAL AORTOGRAM W/LOWER EXTREMITY N/A 10/05/2020   Procedure: ABDOMINAL AORTOGRAM W/LOWER EXTREMITY;  Surgeon: Iran Ouch, MD;  Location: [facility] INVASIVE CV LAB;  Service: Cardiovascular;  Laterality: N/A;   ANGIOPLASTY ILLIAC ARTERY     CATARACT EXTRACTION W/PHACO Left 10/09/2022   Procedure: CATARACT EXTRACTION PHACO AND INTRAOCULAR LENS PLACEMENT (IOC) LEFT 7.45 00:43.5;  Surgeon: Galen Manila, MD;  Location: [facility];  Service: Ophthalmology;  Laterality: Left;   CATARACT EXTRACTION W/PHACO Right 10/30/2022   Procedure: CATARACT EXTRACTION PHACO AND INTRAOCULAR LENS PLACEMENT (IOC) RIGHT  10.58  01:04.4;  Surgeon: Galen Manila, MD;  Location: [facility];  Service: Ophthalmology;  Laterality: Right;   CESAREAN SECTION     COLONOSCOPY WITH PROPOFOL N/A 09/22/2020   Procedure: COLONOSCOPY WITH PROPOFOL;  Surgeon: Toney Reil, MD;  Location: [facility];  Service: Endoscopy;  Laterality: N/A;  NOSE SURGERY     PERIPHERAL VASCULAR CATHETERIZATION N/A 11/14/2016   Procedure: Abdominal Aortogram;  Surgeon: Iran Ouch, MD;  [facility]: [facility];  Service: Cardiovascular;  Laterality: N/A;   PERIPHERAL VASCULAR CATHETERIZATION Bilateral 11/14/2016   Procedure: Lower Extremity Angiography;  Surgeon: Iran Ouch, MD;  [facility]: [facility] CV  [facility];  Service: Cardiovascular;  Laterality: Bilateral;  limited iliacs only   PERIPHERAL VASCULAR CATHETERIZATION Left 11/14/2016   Procedure: Peripheral Vascular Intervention;  Surgeon: Iran Ouch, MD;  [facility]: [facility];  Service: Cardiovascular;  Laterality: Left;  common iliac   PERIPHERAL VASCULAR INTERVENTION  10/05/2020   Procedure: PERIPHERAL VASCULAR INTERVENTION;  Surgeon: Iran Ouch, MD;  [facility]: [facility];  Service: Cardiovascular;;  left common iliac    PERIPHERAL VASCULAR THROMBECTOMY  10/05/2020   Procedure: PERIPHERAL VASCULAR THROMBECTOMY;  Surgeon: Iran Ouch, MD;  [facility]: [facility];  Service: Cardiovascular;;  Mechanical - Left common iliac, left internal iliac, and left SFA   POLYPECTOMY  09/22/2020   Procedure: POLYPECTOMY;  Surgeon: Toney Reil, MD;  Location: [facility];  Service: Endoscopy;;   SKIN CANCER EXCISION     THYROID SURGERY  2007   secondary to thyroid cancer     Current Outpatient Medications  Medication Sig Dispense Refill   clopidogrel (PLAVIX) 75 MG tablet TAKE 1 TABLET BY MOUTH ONCE DAILY 30 tablet 0   Colostrum 500 MG CAPS Take 500 mg by mouth daily.      Glycerin-Hypromellose-PEG 400 (DRY EYE RELIEF DROPS) 0.2-0.2-1 % SOLN Place 1 drop into both eyes daily as needed (Dry eye).     levothyroxine (SYNTHROID) 75 MCG tablet TAKE 1 TABLET BY MOUTH ONCE DAILY ON AN EMPTY STOMACH. WAIT 30 MINUTES BEFORE TAKING OTHER MEDS 90 tablet 3   mirtazapine (REMERON) 15 MG tablet TAKE 1 TABLET BY MOUTH AT BEDTIME 90 tablet 1   naloxone (NARCAN) nasal spray 4 mg/0.1 mL as directed.     propranolol (INDERAL) 10 MG tablet TAKE 1 TABLET BY MOUTH 3 TIMES DAILY AS NEEDED FOR TREMOR 90 tablet 3   rosuvastatin (CRESTOR) 40 MG tablet TAKE 1 TABLET BY MOUTH ONCE EVERY EVENING 90 tablet 3   varenicline (CHANTIX) 1 MG tablet TAKE 1 TABLET BY MOUTH TWICE DAILY 56 tablet 0   Varenicline Tartrate, Starter,  (CHANTIX STARTING MONTH PAK) 0.5 MG X 11 & 1 MG X 42 TBPK Take in gradually increasing doses as directed 53 each 0   VITAMIN D PO Take by mouth daily.     Current Facility-Administered Medications  Medication Dose Route Frequency Provider Last Rate Last Admin   [START ON 04/14/2024] denosumab (PROLIA) injection 60 mg  60 mg Subcutaneous Once Sherlene Shams, MD        Allergies:   Atorvastatin, Darvocet [propoxyphene n-acetaminophen], Demerol, Mirtazapine, Other, Primidone, and Ambien [zolpidem tartrate]    Social History:  The patient  reports that she quit smoking about 12 years ago. Her smoking use included cigarettes. She started smoking about 32 years ago. She has a 20 pack-year smoking history. She has never used smokeless tobacco. She reports that she does not currently use alcohol. She reports that she does not use drugs.   Family History:  The patient's family history includes Alzheimer's disease in her father; Coronary artery disease in her maternal grandmother; Coronary artery disease (age of onset: 63) in her paternal grandmother; Heart disease (age of onset: 81) in her  maternal grandmother; Heart disease (age of onset: 18) in her paternal grandmother; Stroke in her mother.    ROS:  Please see the history of present illness.   Otherwise, review of systems are positive for none.   All other systems are reviewed and negative.    PHYSICAL EXAM: VS:  BP 136/88   Pulse 77   Ht 5' 7.5" (1.715 m)   Wt 120 lb (54.4 kg)   SpO2 96%   BMI 18.52 kg/m  , BMI Body mass index is 18.52 kg/m. GEN: Well nourished, well developed, in no acute distress  HEENT: normal  Neck: no JVD, carotid bruits, or masses Cardiac: RRR; no murmurs, rubs, or gallops,no edema  Respiratory:  clear to auscultation bilaterally, normal work of breathing GI: soft, nontender, nondistended, + BS MS: no deformity or atrophy  Skin: warm and dry, no rash Neuro:  Strength and sensation are intact Psych: euthymic  mood, full affect Vascular: Posterior tibial and dorsalis pedis pulses palpable on the right.  On the left, the posterior tibial is palpable.   EKG:  EKG is ordered today. EKG showed normal sinus rhythm with no significant ST or T wave changes.   Recent Labs: 07/11/2023: ALT 9; Hemoglobin 14.5; Platelets 239.0 08/26/2023: BUN 18; Creatinine, Ser 0.79; Potassium 3.9; Sodium 143; TSH 2.03    Lipid Panel    Component Value Date/Time   CHOL 125 07/11/2023 0858   CHOL 196 02/29/2020 1206   TRIG 72.0 07/11/2023 0858   HDL 58.50 07/11/2023 0858   HDL 46 02/29/2020 1206   CHOLHDL 2 07/11/2023 0858   VLDL 14.4 07/11/2023 0858   LDLCALC 52 07/11/2023 0858   LDLCALC 133 (H) 02/29/2020 1206   LDLDIRECT 63.0 07/11/2023 0858      Wt Readings from Last 3 Encounters:  01/14/24 120 lb (54.4 kg)  10/15/23 111 lb (50.3 kg)  09/16/23 111 lb 12.8 oz (50.7 kg)        ASSESSMENT AND PLAN:  1.  Peripheral arterial disease: With recurrent stent thrombosis of the left common iliac artery likely due to hypercoagulable state.   No recurrent claudication since most recent intervention..  Most recent ABI was normal but there was moderately elevated velocities in the left iliac stent.  Repeat studies in April of this year. The plan is to keep her on clopidogrel indefinitely as tolerated.  2. Hyperlipidemia: Most recent lipid profile in August showed an LDL of 52.  Continue rosuvastatin 40 mg daily.  3. Carotid disease: This was mild on recent carotid Dopple in May of 2021.  4. Coronary calcifications: No anginal symptoms. Continue aggressive treatment of risk factors.  5.  Previous tobacco use: She continues to take Chantix to help with abstinence.   Disposition: Follow-up with me in 12 months.  Signed,  [name], MD  01/14/2024 4:23 PM    [facility]

## 2024-01-14 NOTE — Patient Instructions (Signed)
 Medication Instructions:  No changes *If you need a refill on your cardiac medications before your next appointment, please call your pharmacy*   Lab Work: None ordered If you have labs (blood work) drawn today and your tests are completely normal, you will receive your results only by: MyChart Message (if you have MyChart) OR A paper copy in the mail If you have any lab test that is abnormal or we need to change your treatment, we will call you to review the results.   Testing/Procedures: Your physician has recommended that you have an AORTA/ILIAC Duplex. This is a noninvasive diagnostic test that uses ultrasound technology to look at the aorta and iliac arteries to detect any restriction to blood flow to the buttocks, groin, legs or feet.  No food after 11PM the night before.  Water is OK. (Don't drink liquids if you have been instructed not to for ANOTHER test). Avoid foods that produce bowel gas, for 24 hours prior to exam (see below). No breakfast, no chewing gum, no smoking or carbonated beverages. Patient may take morning medications with water. Come in for test at least 15 minutes early to register. This will take approximately 60 minutes This will take place at [address] [address] [facility], [place] [address]  Your physician has requested that you have an ankle brachial index (ABI). During this test an ultrasound and blood pressure cuff are used to evaluate the arteries that supply the arms and legs with blood.  Allow thirty minutes for this exam.  There are no restrictions or special instructions.  This will take place at [address] ([facility] [address], [place] [address]  Your physician has requested that you have a lower extremity arterial duplex. During this test, ultrasound is used to evaluate arterial blood flow in the legs. Allow one hour for this exam. There are no restrictions or special instructions. This will take place at [address]  [facility] [address], [place] [address]  Please note: We ask at that you not bring children with you during ultrasound (echo/ vascular) testing. Due to room size and safety concerns, children are not allowed in the ultrasound rooms during exams. Our front office staff cannot provide observation of children in our lobby area while testing is being conducted. An adult accompanying a patient to their appointment will only be allowed in the ultrasound room at the discretion of the ultrasound technician under special circumstances. We apologize for any inconvenience.     Follow-Up: At [facility], you and your health needs are our priority.  As part of our continuing mission to provide you with exceptional heart care, we have created designated Provider Care Teams.  These Care Teams include your primary Cardiologist (physician) and Advanced Practice Providers (APPs -  Physician Assistants and Nurse Practitioners) who all work together to provide you with the care you need, when you need it.  We recommend signing up for the patient portal called "MyChart".  Sign up information is provided on this After Visit Summary.  MyChart is used to connect with patients for Virtual Visits (Telemedicine).  Patients are able to view lab/test results, encounter notes, upcoming appointments, etc.  Non-urgent messages can be sent to your provider as well.   To learn more about what you can do with MyChart, go to [email or web address].    Your next appointment:   12 month(s)  Provider:   You may see [name], MD or one of the following Advanced  Practice Providers on your designated Care Team:   Nicolasa Ducking, NP Eula Listen, PA-C Cadence Fransico Michael, PA-C Charlsie Quest, NP Carlos Levering, NP

## 2024-01-15 ENCOUNTER — Ambulatory Visit: Payer: Medicare PPO | Admitting: Dermatology

## 2024-01-17 ENCOUNTER — Ambulatory Visit: Payer: Medicare PPO | Admitting: Internal Medicine

## 2024-01-17 ENCOUNTER — Encounter: Payer: Self-pay | Admitting: Internal Medicine

## 2024-01-17 VITALS — BP 128/76 | HR 73 | Ht 67.5 in | Wt 115.8 lb

## 2024-01-17 DIAGNOSIS — R7301 Impaired fasting glucose: Secondary | ICD-10-CM

## 2024-01-17 DIAGNOSIS — E278 Other specified disorders of adrenal gland: Secondary | ICD-10-CM | POA: Diagnosis not present

## 2024-01-17 DIAGNOSIS — K700 Alcoholic fatty liver: Secondary | ICD-10-CM

## 2024-01-17 DIAGNOSIS — E440 Moderate protein-calorie malnutrition: Secondary | ICD-10-CM | POA: Diagnosis not present

## 2024-01-17 DIAGNOSIS — F339 Major depressive disorder, recurrent, unspecified: Secondary | ICD-10-CM

## 2024-01-17 DIAGNOSIS — Z79899 Other long term (current) drug therapy: Secondary | ICD-10-CM | POA: Diagnosis not present

## 2024-01-17 DIAGNOSIS — E7800 Pure hypercholesterolemia, unspecified: Secondary | ICD-10-CM

## 2024-01-17 DIAGNOSIS — F4321 Adjustment disorder with depressed mood: Secondary | ICD-10-CM

## 2024-01-17 DIAGNOSIS — E032 Hypothyroidism due to medicaments and other exogenous substances: Secondary | ICD-10-CM | POA: Diagnosis not present

## 2024-01-17 DIAGNOSIS — R944 Abnormal results of kidney function studies: Secondary | ICD-10-CM

## 2024-01-17 LAB — COMPREHENSIVE METABOLIC PANEL
ALT: 18 U/L (ref 0–35)
AST: 24 U/L (ref 0–37)
Albumin: 4.5 g/dL (ref 3.5–5.2)
Alkaline Phosphatase: 54 U/L (ref 39–117)
BUN: 14 mg/dL (ref 6–23)
CO2: 32 meq/L (ref 19–32)
Calcium: 9.4 mg/dL (ref 8.4–10.5)
Chloride: 103 meq/L (ref 96–112)
Creatinine, Ser: 0.73 mg/dL (ref 0.40–1.20)
GFR: 84.42 mL/min (ref >60.00)
Glucose, Bld: 90 mg/dL (ref 70–99)
Potassium: 4.3 meq/L (ref 3.5–5.1)
Sodium: 143 meq/L (ref 135–145)
Total Bilirubin: 0.5 mg/dL (ref 0.2–1.2)
Total Protein: 7.6 g/dL (ref 6.0–8.3)

## 2024-01-17 LAB — CBC WITH DIFFERENTIAL/PLATELET
Basophils Absolute: 0.1 10*3/uL (ref 0.0–0.1)
Basophils Relative: 0.6 % (ref 0.0–3.0)
Eosinophils Absolute: 0.4 10*3/uL (ref 0.0–0.7)
Eosinophils Relative: 2.9 % (ref 0.0–5.0)
HCT: 45.8 % (ref 36.0–46.0)
Hemoglobin: 15.3 g/dL — ABNORMAL HIGH (ref 12.0–15.0)
Lymphocytes Relative: 44.5 % (ref 12.0–46.0)
Lymphs Abs: 5.7 10*3/uL — ABNORMAL HIGH (ref 0.7–4.0)
MCHC: 33.4 g/dL (ref 30.0–36.0)
MCV: 103.8 fL — ABNORMAL HIGH (ref 78.0–100.0)
Monocytes Absolute: 0.7 10*3/uL (ref 0.1–1.0)
Monocytes Relative: 5.2 % (ref 3.0–12.0)
Neutro Abs: 6 10*3/uL (ref 1.4–7.7)
Neutrophils Relative %: 46.8 % (ref 43.0–77.0)
Platelets: 241 10*3/uL (ref 150.0–400.0)
RBC: 4.41 Mil/uL (ref 3.87–5.11)
RDW: 12.7 % (ref 11.5–15.5)
WBC: 12.7 10*3/uL — ABNORMAL HIGH (ref 4.0–10.5)

## 2024-01-17 LAB — LDL CHOLESTEROL, DIRECT: Direct LDL: 55 mg/dL

## 2024-01-17 LAB — LIPID PANEL
Cholesterol: 118 mg/dL (ref 0–200)
HDL: 58.4 mg/dL (ref >39.00)
LDL Cholesterol: 46 mg/dL (ref 0–99)
NonHDL: 59.52
Total CHOL/HDL Ratio: 2
Triglycerides: 70 mg/dL (ref 0.0–149.0)
VLDL: 14 mg/dL (ref 0.0–40.0)

## 2024-01-17 LAB — TSH: TSH: 0.15 u[IU]/mL — ABNORMAL LOW (ref 0.35–5.50)

## 2024-01-17 LAB — HEMOGLOBIN A1C: Hgb A1c MFr Bld: 6.2 % (ref 4.6–6.5)

## 2024-01-17 MED ORDER — MIRTAZAPINE 15 MG PO TABS: 15.0000 mg | ORAL_TABLET | Freq: Every day | ORAL | 1 refills | Status: DC

## 2024-01-17 MED ORDER — ROSUVASTATIN CALCIUM 40 MG PO TABS: ORAL_TABLET | ORAL | 3 refills | Status: AC

## 2024-01-17 NOTE — Progress Notes (Signed)
 Subjective:  Patient ID: Tracie Morris, female    DOB: 13-Oct-1956  Age: 68 y.o. MRN: [id number]  CC: The primary encounter diagnosis was Pure hypercholesterolemia. Diagnoses of Iatrogenic hypothyroidism, Long-term use of high-risk medication, Impaired fasting glucose, Grief, Episode of recurrent major depressive disorder, unspecified depression episode severity (HCC), Adrenal mass, left (HCC), Alcoholic fatty liver, Moderate protein-calorie malnutrition (HCC), and Decreased glomerular filtration rate (GFR) were also pertinent to this visit.   HPI Tracie Morris presents for  Chief Complaint  Patient presents with   Medical Management of Chronic Issues    6 month follow up     1) PAD/CAD:  with prior interventions and thrombectomy in 2021.  Now on plavix for life.  Taking rosuvastatin 40 mg for goal LDL < 55.   Reviewed Feb 2025 cardiology note  2) Tobacco abuse: abstinent ,  using chantix daily   3) widowed: husband Karren Burly passed  last year:  has never lived alone until now.  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    4) underweight:  home weight has been stablesince she regained weight after the summer.  She dropped to 110 this summer but has regained weight . Using mirtazapine   5) insomnia:;  had to move downstairs because she sleepwalks .  Has not wandered outside.  Also texting in her sleep.  Neruology referral declined today . Marland Kitchen Not drinkig alcohol more than once every few months Using mitrazipine prn .  HAS NOT TAKEN AMBIEN IN OVER 6 MONTHS   5) TREMOR: USING PROPRANOLOL.    Sleep walking started before propranlol  Outpatient Medications Prior to Visit  Medication Sig Dispense Refill   clopidogrel (PLAVIX) 75 MG tablet TAKE 1 TABLET BY MOUTH ONCE DAILY 30 tablet 0   Colostrum 500 MG CAPS Take 500 mg by mouth daily.      Glycerin-Hypromellose-PEG 400 (DRY EYE RELIEF DROPS) 0.2-0.2-1 % SOLN Place 1 drop into both eyes  daily as needed (Dry eye).     naloxone (NARCAN) nasal spray 4 mg/0.1 mL as directed.     propranolol (INDERAL) 10 MG tablet TAKE 1 TABLET BY MOUTH 3 TIMES DAILY AS NEEDED FOR TREMOR 90 tablet 3   varenicline (CHANTIX) 1 MG tablet TAKE 1 TABLET BY MOUTH TWICE DAILY 56 tablet 0   VITAMIN D PO Take by mouth daily.     levothyroxine (SYNTHROID) 75 MCG tablet TAKE 1 TABLET BY MOUTH ONCE DAILY ON AN EMPTY STOMACH. WAIT 30 MINUTES BEFORE TAKING OTHER MEDS 90 tablet 3   mirtazapine (REMERON) 15 MG tablet TAKE 1 TABLET BY MOUTH AT BEDTIME 90 tablet 1   rosuvastatin (CRESTOR) 40 MG tablet TAKE 1 TABLET BY MOUTH ONCE EVERY EVENING 90 tablet 3   Varenicline Tartrate, Starter, (CHANTIX STARTING MONTH PAK) 0.5 MG X 11 & 1 MG X 42 TBPK Take in gradually increasing doses as directed (Patient not taking: Reported on 01/17/2024) 53 each 0   Facility-Administered Medications Prior to Visit  Medication Dose Route Frequency Provider Last Rate Last Admin   [START ON 04/14/2024] denosumab (PROLIA) injection 60 mg  60 mg Subcutaneous Once Sherlene Shams, MD        Review of Systems;  Patient denies headache, fevers, malaise, unintentional weight loss, skin rash, eye pain, sinus congestion and sinus pain, sore throat, dysphagia,  hemoptysis , cough, dyspnea, wheezing, chest pain, palpitations, orthopnea, edema, abdominal pain, nausea, melena,  diarrhea, constipation, flank pain, dysuria, hematuria, urinary  Frequency, nocturia, numbness, tingling, seizures,  Focal weakness, Loss of consciousness,  Tremor, insomnia, depression, anxiety, and suicidal ideation.      Objective:  BP 128/76   Pulse 73   Ht 5' 7.5" (1.715 m)   Wt 115 lb 12.8 oz (52.5 kg)   SpO2 98%   BMI 17.87 kg/m   BP Readings from Last 3 Encounters:  01/17/24 128/76  01/14/24 136/88  09/16/23 (!) 142/80    Wt Readings from Last 3 Encounters:  01/17/24 115 lb 12.8 oz (52.5 kg)  01/14/24 120 lb (54.4 kg)  10/15/23 111 lb (50.3 kg)     Physical Exam Vitals reviewed.  Constitutional:      General: She is not in acute distress.    Appearance: Normal appearance. She is underweight. She is not ill-appearing, toxic-appearing or diaphoretic.  HENT:     Head: Normocephalic.  Eyes:     General: No scleral icterus.       Right eye: No discharge.        Left eye: No discharge.     Conjunctiva/sclera: Conjunctivae normal.  Cardiovascular:     Rate and Rhythm: Normal rate and regular rhythm.     Heart sounds: Normal heart sounds.  Pulmonary:     Effort: Pulmonary effort is normal. No respiratory distress.     Breath sounds: Normal breath sounds.  Musculoskeletal:        General: Normal range of motion.  Skin:    General: Skin is warm and dry.  Neurological:     General: No focal deficit present.     Mental Status: She is alert and oriented to person, place, and time. Mental status is at baseline.  Psychiatric:        Mood and Affect: Mood normal.        Behavior: Behavior normal.        Thought Content: Thought content normal.        Judgment: Judgment normal.   Lab Results  Component Value Date   HGBA1C 6.2 01/17/2024   HGBA1C 5.8 07/11/2023   HGBA1C 6.0 01/14/2023    Lab Results  Component Value Date   CREATININE 0.73 01/17/2024   CREATININE 0.79 08/26/2023   CREATININE 1.11 07/11/2023    Lab Results  Component Value Date   WBC 12.7 (H) 01/17/2024   HGB 15.3 (H) 01/17/2024   HCT 45.8 01/17/2024   PLT 241.0 01/17/2024   GLUCOSE 90 01/17/2024   CHOL 118 01/17/2024   TRIG 70.0 01/17/2024   HDL 58.40 01/17/2024   LDLDIRECT 55.0 01/17/2024   LDLCALC 46 01/17/2024   ALT 18 01/17/2024   AST 24 01/17/2024   NA 143 01/17/2024   K 4.3 01/17/2024   CL 103 01/17/2024   CREATININE 0.73 01/17/2024   BUN 14 01/17/2024   CO2 32 01/17/2024   TSH 0.15 (L) 01/17/2024   INR 1.0 11/05/2016   HGBA1C 6.2 01/17/2024   MICROALBUR 5.3 (H) 07/15/2023    CT CHEST LUNG CA SCREEN LOW DOSE W/O CM Result Date:  10/25/2023 CLINICAL DATA:  Former 47 pack-year smoker.  Quit in July. EXAM: CT CHEST WITHOUT CONTRAST LOW-DOSE FOR LUNG CANCER SCREENING TECHNIQUE: Multidetector CT imaging of the chest was performed following the standard protocol without IV contrast. RADIATION DOSE REDUCTION: This exam was performed according to the departmental dose-optimization program which includes automated exposure control, adjustment of the mA and/or kV according to patient size and/or use of iterative  reconstruction technique. COMPARISON:  09/19/2022. FINDINGS: Cardiovascular: Atherosclerotic calcification of the aorta, aortic valve and coronary arteries. Heart size normal. No pericardial effusion. Mediastinum/Nodes: No pathologically enlarged mediastinal or axillary lymph nodes. Hilar regions are difficult to definitively evaluate without IV contrast. Esophagus is grossly unremarkable. Lungs/Pleura: Centrilobular emphysema. Calcified granulomas. Residual smoking related respiratory bronchiolitis. Scattered pulmonary parenchymal scarring. Pulmonary nodules measure 3.9 mm or less in size, as before. No new pulmonary nodules. No pleural fluid. Airway is unremarkable. Upper Abdomen: Visualized portions of the liver, adrenal glands, kidneys, spleen, pancreas, stomach and bowel are grossly unremarkable. No upper abdominal adenopathy. Musculoskeletal: Minimal degenerative change in the spine. IMPRESSION: 1. Lung-RADS 2, benign appearance or behavior. Continue annual screening with low-dose chest CT without contrast in 12 months. 2. Aortic atherosclerosis (ICD10-I70.0). Coronary artery calcification. 3.  Emphysema (ICD10-J43.9). Electronically Signed   By: Leanna Battles M.D.   On: 10/25/2023 12:52    Assessment & Plan:  .Pure hypercholesterolemia -     Lipid panel -     LDL cholesterol, direct  Iatrogenic hypothyroidism Assessment & Plan: Thyroid function is overactive on current dose of 75 mcg. Given her underweight status. I  elected to send  a lower dose of levothyroxine to her pharmacy and would like patient to change immediately and recheck tsh in 6 weeks   Lab Results  Component Value Date   TSH 0.15 (L) 01/17/2024     Orders: -     TSH  Long-term use of high-risk medication -     CBC with Differential/Platelet  Impaired fasting glucose -     Comprehensive metabolic panel -     Hemoglobin A1c  Grief Assessment & Plan: Her husband passed In April 2024 after sustaining multiple traumatic rib fractures .   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    Episode of recurrent major depressive disorder, unspecified depression episode severity (HCC) Assessment & Plan: Improved .  Did not tolerate  addition of Lexapro .  Continue mirtazipine    Adrenal mass, left [facility]) Assessment & Plan: Incidentaloma.  Continue Annual follow up by Dr Lafe Garin   Alcoholic fatty liver Assessment & Plan: Suggested by last CT .   LFTS  remain normal with alcohol abstinence  Lab Results  Component Value Date   ALT 18 01/17/2024   AST 24 01/17/2024   ALKPHOS 54 01/17/2024   BILITOT 0.5 01/17/2024        Moderate protein-calorie malnutrition (HCC) Assessment & Plan: Weight is improving  slightly ,  weight loss occurred  during COVID infection over Christmas.  Continue mirtazapine   Decreased glomerular filtration rate (GFR) Assessment & Plan: Renal function has normalized Lab Results  Component Value Date   CREATININE 0.73 01/17/2024      Other orders -     Mirtazapine; Take 1 tablet (15 mg total) by mouth at bedtime.  Dispense: 90 tablet; Refill: 1 -     Rosuvastatin Calcium; TAKE 1 TABLET BY MOUTH ONCE EVERY EVENING  Dispense: 90 tablet; Refill: 3 -     Levothyroxine Sodium; Take 1 tablet (50 mcg total) by mouth daily.  Dispense: 90 tablet; Refill: 3     I spent 34 minutes on the day of this face  to face encounter reviewing patient's  most recent visit with cardiology,  endocrinology ,  prior relevant surgical and non surgical procedures, recent  labs and  imaging studies, counseling on underweight management,  reviewing the assessment and plan with patient, and post visit ordering and reviewing of  diagnostics and therapeutics with patient  .   Follow-up: Return in about 6 months (around 07/16/2024).   Sherlene Shams, MD

## 2024-01-17 NOTE — Patient Instructions (Addendum)
 The ShingRx vaccine is now available in local pharmacies and is much more protective than the old one  Zostavax  (it is about 97%  Effective in preventing shingles). .   It is therefore ADVISED for all interested adults over 50 to prevent shingles so I have printed you a prescription for it.  (it requires a 2nd dose 2 too 6 months after the first one) .  It will cause you to have flu  like symptoms for 2 days     You are DUE for your tetanus-diptheria-pertussis vaccine   (TDaP)   Please get this done at your pharmacy l;  it will be PAID FOR MY MEDICARE ONLY AT YOUR PHARMACY    NEXT PROLIA INJECTION IS DUE IN MAY 2025

## 2024-01-18 ENCOUNTER — Encounter: Payer: Self-pay | Admitting: Internal Medicine

## 2024-01-18 MED ORDER — LEVOTHYROXINE SODIUM 50 MCG PO TABS
50.0000 ug | ORAL_TABLET | Freq: Every day | ORAL | 3 refills | Status: AC
Start: 2024-01-18 — End: ?

## 2024-01-18 NOTE — Assessment & Plan Note (Signed)
 Suggested by last CT .   LFTS  remain normal with alcohol abstinence  Lab Results  Component Value Date   ALT 18 01/17/2024   AST 24 01/17/2024   ALKPHOS 54 01/17/2024   BILITOT 0.5 01/17/2024

## 2024-01-18 NOTE — Assessment & Plan Note (Signed)
 Her husband passed In April 2024 after sustaining multiple traumatic rib fractures .   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

## 2024-01-18 NOTE — Assessment & Plan Note (Addendum)
 Thyroid function is overactive on current dose of 75 mcg. Given her underweight status. I elected to send  a lower dose of levothyroxine to her pharmacy and would like patient to change immediately and recheck tsh in 6 weeks   Lab Results  Component Value Date   TSH 0.15 (L) 01/17/2024

## 2024-01-18 NOTE — Assessment & Plan Note (Signed)
 Incidentaloma.  Continue Annual follow up by Dr [name]

## 2024-01-18 NOTE — Assessment & Plan Note (Addendum)
 Weight is improving  slightly ,  weight loss occurred  during COVID infection over Christmas.  Continue mirtazapine

## 2024-01-18 NOTE — Assessment & Plan Note (Addendum)
 Improved .  Did not tolerate  addition of Lexapro .  Continue mirtazipine

## 2024-01-18 NOTE — Assessment & Plan Note (Signed)
 Renal function has normalized Lab Results  Component Value Date   CREATININE 0.73 01/17/2024

## 2024-01-20 ENCOUNTER — Other Ambulatory Visit: Payer: Self-pay

## 2024-01-20 DIAGNOSIS — E032 Hypothyroidism due to medicaments and other exogenous substances: Secondary | ICD-10-CM

## 2024-02-05 ENCOUNTER — Ambulatory Visit (INDEPENDENT_AMBULATORY_CARE_PROVIDER_SITE_OTHER): Payer: Medicare PPO | Admitting: Dermatology

## 2024-02-05 DIAGNOSIS — L821 Other seborrheic keratosis: Secondary | ICD-10-CM

## 2024-02-05 DIAGNOSIS — L578 Other skin changes due to chronic exposure to nonionizing radiation: Secondary | ICD-10-CM

## 2024-02-05 DIAGNOSIS — Z7189 Other specified counseling: Secondary | ICD-10-CM | POA: Diagnosis not present

## 2024-02-05 DIAGNOSIS — Z5111 Encounter for antineoplastic chemotherapy: Secondary | ICD-10-CM

## 2024-02-05 DIAGNOSIS — L570 Actinic keratosis: Secondary | ICD-10-CM

## 2024-02-05 DIAGNOSIS — W908XXA Exposure to other nonionizing radiation, initial encounter: Secondary | ICD-10-CM

## 2024-02-05 DIAGNOSIS — Z79899 Other long term (current) drug therapy: Secondary | ICD-10-CM

## 2024-02-05 DIAGNOSIS — L814 Other melanin hyperpigmentation: Secondary | ICD-10-CM

## 2024-02-05 NOTE — Progress Notes (Signed)
 Follow-Up Visit   Subjective  Tracie Morris is a 68 y.o. female who presents for the following: Dermatitis vs ISKs vs Aks at the right and left malar cheeks. She used Opzelura cream to areas, which helped, but areas are still there.   The following portions of the chart were reviewed this encounter and updated as appropriate: medications, allergies, medical history  Review of Systems:  No other skin or systemic complaints except as noted in HPI or Assessment and Plan.  Objective  Well appearing patient in no apparent distress; mood and affect are within normal limits.  A focused examination was performed of the following areas: Face  Relevant physical exam findings are noted in the Assessment and Plan.  malar cheeks Pink scaly macules at bilateral malar cheeks.  Assessment & Plan   ACTINIC DAMAGE WITH PRECANCEROUS ACTINIC KERATOSES Counseling for Topical Chemotherapy Management: Patient exhibits: - Severe, confluent actinic changes with pre-cancerous actinic keratoses that is secondary to cumulative UV radiation exposure over time - Condition that is severe; chronic, not at goal. - diffuse scaly erythematous macules and papules with underlying dyspigmentation - Discussed Prescription "Field Treatment" topical Chemotherapy for Severe, Chronic Confluent Actinic Changes with Pre-Cancerous Actinic Keratoses Field treatment involves treatment of an entire area of skin that has confluent Actinic Changes (Sun/ Ultraviolet light damage) and PreCancerous Actinic Keratoses by method of PhotoDynamic Therapy (PDT) and/or prescription Topical Chemotherapy agents such as 5-fluorouracil, 5-fluorouracil/calcipotriene, and/or imiquimod.  The purpose is to decrease the number of clinically evident and subclinical PreCancerous lesions to prevent progression to development of skin cancer by chemically destroying early precancer changes that may or may not be visible.  It has been shown to reduce the  risk of developing skin cancer in the treated area. As a result of treatment, redness, scaling, crusting, and open sores may occur during treatment course. One or more than one of these methods may be used and may have to be used several times to control, suppress and eliminate the PreCancerous changes. Discussed treatment course, expected reaction, and possible side effects. - Recommend daily broad spectrum sunscreen SPF 30+ to sun-exposed areas, reapply every 2 hours as needed.  - Staying in the shade or wearing long sleeves, sun glasses (UVA+UVB protection) and wide brim hats (4-inch brim around the entire circumference of the hat) are also recommended. - Call for new or changing lesions. - Start 5-fluorouracil/calcipotriene cream twice a day for 5-7 days to affected areas including face. Prescription sent to [facility]. Patient advised they will receive an email to purchase the medication online and have it sent to their home. Patient provided with handout reviewing treatment course and side effects and advised to call or message [place] on MyChart with any concerns.  Reviewed course of treatment and expected reaction.  Patient advised to expect inflammation and crusting and advised that erosions are possible.  Patient advised to be diligent with sun protection during and after treatment. Counseled to keep medication out of reach of children and pets.    SEBORRHEIC KERATOSIS - Stuck-on, waxy, tan-brown papules face - Benign-appearing - Discussed benign etiology and prognosis. - Observe - Call for any changes Discussed cosmetic procedure cryotherapy, noncovered.  $60 for 1st lesion and $15 for each additional lesion if done on the same day.  Maximum charge $350.  One touch-up treatment included no charge. Discussed risks of treatment including dyspigmentation, small scar, and/or recurrence. Recommend daily broad spectrum sunscreen SPF 30+/photoprotection to treated areas once  healed.  LENTIGINES Exam: decreased scattered tan macules Due to sun exposure Treatment Plan: Benign-appearing, observe. Recommend daily broad spectrum sunscreen SPF 30+ to sun-exposed areas, reapply every 2 hours as needed.  Call for any changes Pt with good results from BBL x 2 treatments to face Recommend annual BBL for maintenance at face  Counseling for BBL / IPL / Laser and Coordination of Care Discussed the treatment option of Broad Band Light (BBL) /Intense Pulsed Light (IPL)/ Laser for skin discoloration, including brown spots and redness.  Typically we recommend at least 1-3 treatment sessions about 5-8 weeks apart for best results.  Cannot have tanned skin when BBL performed, and regular use of sunscreen/photoprotection is advised after the procedure to help maintain results. The patient's condition may also require "maintenance treatments" in the future.  The fee for BBL / laser treatments is $350 per treatment session for the whole face.  A fee can be quoted for other parts of the body.  Insurance typically does not pay for BBL/laser treatments and therefore the fee is an out-of-pocket cost. Recommend prophylactic valtrex treatment. Once scheduled for procedure, will send Rx in prior to patient's appointment.      AK (ACTINIC KERATOSIS) malar cheeks - Start 5-fluorouracil/calcipotriene cream twice a day for 5-7 days to affected areas including cheeks and nose (may treat entire face). Prescription sent to [facility]. Patient advised they will receive an email to purchase the medication online and have it sent to their home. Patient provided with handout reviewing treatment course and side effects and advised to call or message [place] on MyChart with any concerns.  Reviewed course of treatment and expected reaction.  Patient advised to expect inflammation and crusting and advised that erosions are possible.  Patient advised to be diligent with sun protection  during and after treatment. Counseled to keep medication out of reach of children and pets.   Actinic keratoses are precancerous spots that appear secondary to cumulative UV radiation exposure/sun exposure over time. They are chronic with expected duration over 1 year. A portion of actinic keratoses will progress to squamous cell carcinoma of the skin. It is not possible to reliably predict which spots will progress to skin cancer and so treatment is recommended to prevent development of skin cancer.  Recommend daily broad spectrum sunscreen SPF 30+ to sun-exposed areas, reapply every 2 hours as needed.  Recommend staying in the shade or wearing long sleeves, sun glasses (UVA+UVB protection) and wide brim hats (4-inch brim around the entire circumference of the hat). Call for new or changing lesions.   Return as scheduled.  ICherlyn Labella, CMA, am acting as scribe for Willeen Niece, MD .   Documentation: I have reviewed the above documentation for accuracy and completeness, and I agree with the above.  Willeen Niece, MD

## 2024-02-05 NOTE — Patient Instructions (Addendum)
 - Start 5-fluorouracil/calcipotriene cream twice a day for 5-7 days to affected areas including nose and cheeks (may treat entire face). Prescription sent to [facility]. Patient advised they will receive an email to purchase the medication online and have it sent to their home. Patient provided with handout reviewing treatment course and side effects and advised to call or message [place] on MyChart with any concerns.  Reviewed course of treatment and expected reaction.  Patient advised to expect inflammation and crusting and advised that erosions are possible.  Patient advised to be diligent with sun protection during and after treatment. Counseled to keep medication out of reach of children and pets.  Instructions for [facility]  One or more of your medications was sent to the [facility] mail order [facility] pharmacy. You will receive an email from them and can purchase the medicine through that link. It will then be mailed to your home at the address you confirmed. If for any reason you do not receive an email from them, please check your spam folder. If you still do not find the email, please let us know. [facility] phone number is [phone].   5-Fluorouracil/Calcipotriene Patient Education   Actinic keratoses are the dry, red scaly spots on the skin caused by sun damage. A portion of these spots can turn into skin cancer with time, and treating them can help prevent development of skin cancer.   Treatment of these spots requires removal of the defective skin cells. There are various ways to remove actinic keratoses, including freezing with liquid nitrogen, treatment with creams, or treatment with a blue light procedure in the office.   5-fluorouracil cream is a topical cream used to treat actinic keratoses. It works by interfering with the growth of abnormal fast-growing skin cells, such as actinic keratoses. These cells peel off and are  replaced by healthy ones.   5-fluorouracil/calcipotriene is a combination of the 5-fluorouracil cream with a vitamin D analog cream called calcipotriene. The calcipotriene alone does not treat actinic keratoses. However, when it is combined with 5-fluorouracil, it helps the 5-fluorouracil treat the actinic keratoses much faster so that the same results can be achieved with a much shorter treatment time.  INSTRUCTIONS FOR 5-FLUOROURACIL/CALCIPOTRIENE CREAM:   5-fluorouracil/calcipotriene cream typically only needs to be used for 4-7 days. A thin layer should be applied twice a day to the treatment areas recommended by your physician.   If your physician prescribed you separate tubes of 5-fluourouracil and calcipotriene, apply a thin layer of 5-fluorouracil followed by a thin layer of calcipotriene.   Avoid contact with your eyes, nostrils, and mouth. Do not use 5-fluorouracil/calcipotriene cream on infected or open wounds.   You will develop redness, irritation and some crusting at areas where you have pre-cancer damage/actinic keratoses. IF YOU DEVELOP PAIN, BLEEDING, OR SIGNIFICANT CRUSTING, STOP THE TREATMENT EARLY - you have already gotten a good response and the actinic keratoses should clear up well.  Wash your hands after applying 5-fluorouracil 5% cream on your skin.   A moisturizer or sunscreen with a minimum SPF 30 should be applied each morning.   Once you have finished the treatment, you can apply a thin layer of Vaseline twice a day to irritated areas to soothe and calm the areas more quickly. If you experience significant discomfort, contact your physician.  For some patients it is necessary to repeat the treatment for best results.  SIDE EFFECTS: When using 5-fluorouracil/calcipotriene cream, you may have mild irritation,  such as redness, dryness, swelling, or a mild burning sensation. This usually resolves within 2 weeks. The more actinic keratoses you have, the more redness and  inflammation you can expect during treatment. Eye irritation has been reported rarely. If this occurs, please let us know.  If you have any trouble using this cream, please call the office. If you have any other questions about this information, please do not hesitate to ask me before you leave the office.  Due to recent changes in healthcare laws, you may see results of your pathology and/or laboratory studies on MyChart before the doctors have had a chance to review them. We understand that in some cases there may be results that are confusing or concerning to you. Please understand that not all results are received at the same time and often the doctors may need to interpret multiple results in order to provide you with the best plan of care or course of treatment. Therefore, we ask that you please give [place] 2 business days to thoroughly review all your results before contacting the office for clarification. Should we see a critical lab result, you will be contacted sooner.   If You Need Anything After Your Visit  If you have any questions or concerns for your doctor, please call our main line at [phone] and press option 4 to reach your doctor's medical assistant. If no one answers, please leave a voicemail as directed and we will return your call as soon as possible. Messages left after 4 pm will be answered the following business day.   You may also send [place] a message via MyChart. We typically respond to MyChart messages within 1-2 business days.  For prescription refills, please ask your pharmacy to contact our office. Our fax number is [phone].  If you have an urgent issue when the clinic is closed that cannot wait until the next business day, you can page your doctor at the number below.    Please note that while we do our best to be available for urgent issues outside of office hours, we are not available 24/7.   If you have an urgent issue and are unable to reach [place], you may choose  to seek medical care at your doctor's office, retail clinic, urgent care center, or emergency room.  If you have a medical emergency, please immediately call 911 or go to the emergency department.  Pager Numbers  - Dr. Gwen Pounds: [phone]  - Dr. Roseanne Reno: [phone]  - Dr. Katrinka Blazing: [phone]   In the event of inclement weather, please call our main line at [phone] for an update on the status of any delays or closures.  Dermatology Medication Tips: Please keep the boxes that topical medications come in in order to help keep track of the instructions about where and how to use these. Pharmacies typically print the medication instructions only on the boxes and not directly on the medication tubes.   If your medication is too expensive, please contact our office at [phone] option 4 or send [place] a message through MyChart.   We are unable to tell what your co-pay for medications will be in advance as this is different depending on your insurance coverage. However, we may be able to find a substitute medication at lower cost or fill out paperwork to get insurance to cover a needed medication.   If a prior authorization is required to get your medication covered by your insurance company, please allow [place] 1-2 business days to complete  this process.  Drug prices often vary depending on where the prescription is filled and some pharmacies may offer cheaper prices.  The website www.goodrx.com contains coupons for medications through different pharmacies. The prices here do not account for what the cost may be with help from insurance (it may be cheaper with your insurance), but the website can give you the price if you did not use any insurance.  - You can print the associated coupon and take it with your prescription to the pharmacy.  - You may also stop by our office during regular business hours and pick up a GoodRx coupon card.  - If you need your prescription sent electronically to  a different pharmacy, notify our office through [facility] or by phone at [phone] option 4.     Si Usted Necesita Algo Despus de Su Visita  Tambin puede enviarnos un mensaje a travs de Clinical cytogeneticist. Por lo general respondemos a los mensajes de MyChart en el transcurso de 1 a 2 das hbiles.  Para renovar recetas, por favor pida a su farmacia que se ponga en contacto con nuestra oficina. Annie Sable de fax es [place] [phone].  Si tiene un asunto urgente cuando la clnica est cerrada y que no puede esperar hasta el siguiente da hbil, puede llamar/localizar a su doctor(a) al nmero que aparece a continuacin.   Por favor, tenga en cuenta que aunque hacemos todo lo posible para estar disponibles para asuntos urgentes fuera del horario de [place], no estamos disponibles las 24 horas del da, los 7 [address]  [address] de la [place].   Si tiene un problema urgente y no puede comunicarse con nosotros, puede optar por buscar atencin mdica  en el consultorio de su doctor(a), en una clnica privada, en un centro de atencin urgente o en una sala de emergencias.  Si tiene Engineer, drilling, por favor llame inmediatamente al 911 o vaya a la sala de emergencias.  Nmeros de bper  - Dr. Gwen Pounds: [phone]  - Dra. Roseanne Reno: [phone]  - Dr. Katrinka Blazing: [phone]   En caso de inclemencias del tiempo, por favor llame a Lacy Duverney principal al [phone] para una actualizacin sobre el [place] de cualquier retraso o cierre.  Consejos para la medicacin en dermatologa: Por favor, guarde las cajas en las que vienen los medicamentos de uso tpico para ayudarle a seguir las instrucciones sobre dnde y cmo usarlos. Las farmacias generalmente imprimen las instrucciones del medicamento slo en las cajas y no directamente en los tubos del [place].   Si su medicamento es muy caro, por favor, pngase en contacto con Rolm Gala llamando al [phone] y presione la opcin 4 o envenos  un mensaje a travs de Clinical cytogeneticist.   No podemos decirle cul ser su copago por los medicamentos por adelantado ya que esto es diferente dependiendo de la cobertura de su seguro. Sin embargo, es posible que podamos encontrar un medicamento sustituto a Audiological scientist un formulario para que el seguro cubra el medicamento que se considera necesario.   Si se requiere una autorizacin previa para que su compaa de seguros [place] su medicamento, por favor permtanos de 1 a 2 das hbiles para completar [address]  Los precios de los medicamentos varan con frecuencia dependiendo del Environmental consultant de dnde se surte la receta y alguna farmacias pueden ofrecer precios ms baratos.  El sitio web www.goodrx.com tiene cupones para medicamentos de Health and safety inspector. Los precios aqu no tienen en cuenta lo que podra costar con la ayuda del  seguro (puede ser ms barato con su seguro), pero el sitio web puede darle el precio si no Visual merchandiser.  - Puede imprimir el cupn correspondiente y llevarlo con su receta a la farmacia.  - Tambin puede pasar por nuestra oficina durante el horario de atencin regular y Education officer, museum una tarjeta de cupones de GoodRx.  - Si necesita que su receta se enve electrnicamente a una farmacia diferente, informe a nuestra oficina a travs de MyChart de [place] o por telfono llamando al [phone] y presione la opcin 4.

## 2024-02-11 ENCOUNTER — Encounter: Payer: Self-pay | Admitting: Oncology

## 2024-02-13 ENCOUNTER — Ambulatory Visit: Payer: Medicare PPO | Attending: Cardiovascular Disease

## 2024-02-13 ENCOUNTER — Ambulatory Visit (INDEPENDENT_AMBULATORY_CARE_PROVIDER_SITE_OTHER): Payer: Medicare PPO

## 2024-02-13 DIAGNOSIS — Z95828 Presence of other vascular implants and grafts: Secondary | ICD-10-CM | POA: Diagnosis not present

## 2024-02-13 DIAGNOSIS — I739 Peripheral vascular disease, unspecified: Secondary | ICD-10-CM

## 2024-02-13 LAB — VAS US ABI WITH/WO TBI
Left ABI: 1.05
Right ABI: 1.07

## 2024-02-14 ENCOUNTER — Other Ambulatory Visit: Payer: Self-pay | Admitting: *Deleted

## 2024-02-14 DIAGNOSIS — I739 Peripheral vascular disease, unspecified: Secondary | ICD-10-CM

## 2024-02-17 ENCOUNTER — Other Ambulatory Visit: Payer: Self-pay | Admitting: Cardiovascular Disease

## 2024-02-25 ENCOUNTER — Telehealth: Payer: Self-pay | Admitting: Internal Medicine

## 2024-02-25 NOTE — Addendum Note (Signed)
 Addended by: Jarvis Morgan D on: 02/25/2024 03:39 PM   Modules accepted: Orders

## 2024-02-25 NOTE — Telephone Encounter (Signed)
 Patient need lab orders.

## 2024-03-03 ENCOUNTER — Other Ambulatory Visit (INDEPENDENT_AMBULATORY_CARE_PROVIDER_SITE_OTHER): Payer: Medicare PPO

## 2024-03-03 DIAGNOSIS — E032 Hypothyroidism due to medicaments and other exogenous substances: Secondary | ICD-10-CM

## 2024-03-03 LAB — TSH: TSH: 2.19 u[IU]/mL (ref 0.35–5.50)

## 2024-03-04 ENCOUNTER — Encounter: Payer: Self-pay | Admitting: Internal Medicine

## 2024-03-18 ENCOUNTER — Telehealth: Payer: Self-pay

## 2024-03-18 ENCOUNTER — Other Ambulatory Visit (HOSPITAL_COMMUNITY): Payer: Self-pay

## 2024-03-18 NOTE — Telephone Encounter (Signed)
 Tracie Morris

## 2024-03-18 NOTE — Telephone Encounter (Signed)
 Pt ready for scheduling for PROLIA  on or after : 04/14/24  Option# 1: Buy/Bill (Office supplied medication)  Out-of-pocket cost due at time of clinic visit: $40  Number of injection/visits approved: 2  Primary: [employer] Prolia  co-insurance: $40 Admin fee co-insurance: 0%  Secondary: --- Prolia  co-insurance:  Admin fee co-insurance:   Medical Benefit Details: Date Benefits were checked: 03/12/24 Deductible: NO/ Coinsurance: $40/ Admin Fee: 0%  Prior Auth: APPROVED PA# [id number] Expiration Date: 11/27/23-11/25/24  # of doses approved: 2 ----------------------------------------------------------------------- Option# 2- Med Obtained from pharmacy:  Pharmacy benefit: Copay $64 (Paid to pharmacy) Admin Fee: 0% (Pay at clinic)  Prior Auth: N/A PA# Expiration Date:   # of doses approved:   If patient wants fill through the pharmacy benefit please send prescription to: [employer], and include estimated need by date in rx notes. Pharmacy will ship medication directly to the office.  Patient NOT eligible for Prolia  Copay Card. Copay Card can make patient's cost as little as $25. Link to apply: https://www.amgensupportplus.com/copay  ** This summary of benefits is an estimation of the patient's out-of-pocket cost. Exact cost may very based on individual plan coverage.

## 2024-03-24 ENCOUNTER — Encounter: Payer: Self-pay | Admitting: *Deleted

## 2024-04-14 ENCOUNTER — Ambulatory Visit: Payer: Medicare PPO

## 2024-04-15 ENCOUNTER — Ambulatory Visit (INDEPENDENT_AMBULATORY_CARE_PROVIDER_SITE_OTHER)

## 2024-04-15 DIAGNOSIS — M47812 Spondylosis without myelopathy or radiculopathy, cervical region: Secondary | ICD-10-CM

## 2024-04-15 DIAGNOSIS — M810 Age-related osteoporosis without current pathological fracture: Secondary | ICD-10-CM

## 2024-04-15 MED ORDER — DENOSUMAB 60 MG/ML ~~LOC~~ SOSY
60.0000 mg | PREFILLED_SYRINGE | Freq: Once | SUBCUTANEOUS | Status: AC
  Administered 2024-11-04: 60 mg via SUBCUTANEOUS

## 2024-04-15 NOTE — Progress Notes (Signed)
Patient presented for Prolia injection in her right arm, patient voiced no concerns nor showed any signs of distress during injection

## 2024-06-16 ENCOUNTER — Ambulatory Visit: Payer: Medicare PPO | Admitting: Dermatology

## 2024-06-16 DIAGNOSIS — L820 Inflamed seborrheic keratosis: Secondary | ICD-10-CM | POA: Diagnosis not present

## 2024-06-16 DIAGNOSIS — L821 Other seborrheic keratosis: Secondary | ICD-10-CM

## 2024-06-16 DIAGNOSIS — Z85828 Personal history of other malignant neoplasm of skin: Secondary | ICD-10-CM

## 2024-06-16 DIAGNOSIS — Z1283 Encounter for screening for malignant neoplasm of skin: Secondary | ICD-10-CM | POA: Diagnosis not present

## 2024-06-16 DIAGNOSIS — D1801 Hemangioma of skin and subcutaneous tissue: Secondary | ICD-10-CM

## 2024-06-16 DIAGNOSIS — L570 Actinic keratosis: Secondary | ICD-10-CM | POA: Diagnosis not present

## 2024-06-16 DIAGNOSIS — D229 Melanocytic nevi, unspecified: Secondary | ICD-10-CM

## 2024-06-16 DIAGNOSIS — D492 Neoplasm of unspecified behavior of bone, soft tissue, and skin: Secondary | ICD-10-CM

## 2024-06-16 DIAGNOSIS — C44619 Basal cell carcinoma of skin of left upper limb, including shoulder: Secondary | ICD-10-CM

## 2024-06-16 DIAGNOSIS — D2222 Melanocytic nevi of left ear and external auricular canal: Secondary | ICD-10-CM

## 2024-06-16 DIAGNOSIS — L814 Other melanin hyperpigmentation: Secondary | ICD-10-CM

## 2024-06-16 DIAGNOSIS — Z872 Personal history of diseases of the skin and subcutaneous tissue: Secondary | ICD-10-CM

## 2024-06-16 DIAGNOSIS — D2262 Melanocytic nevi of left upper limb, including shoulder: Secondary | ICD-10-CM

## 2024-06-16 DIAGNOSIS — L578 Other skin changes due to chronic exposure to nonionizing radiation: Secondary | ICD-10-CM

## 2024-06-16 DIAGNOSIS — D2321 Other benign neoplasm of skin of right ear and external auricular canal: Secondary | ICD-10-CM

## 2024-06-16 DIAGNOSIS — W908XXA Exposure to other nonionizing radiation, initial encounter: Secondary | ICD-10-CM | POA: Diagnosis not present
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
## 2024-06-16 NOTE — Patient Instructions (Addendum)
 Cryotherapy Aftercare  Wash gently with soap and water everyday.   Apply Vaseline and Band-Aid daily until healed.    Electrodesiccation and Curettage ("Scrape and Burn") Wound Care Instructions  Leave the original bandage on for 24 hours if possible.  If the bandage becomes soaked or soiled before that time, it is OK to remove it and examine the wound.  A small amount of post-operative bleeding is normal.  If excessive bleeding occurs, remove the bandage, place gauze over the site and apply continuous pressure (no peeking) over the area for 30 minutes. If this does not work, please call our clinic as soon as possible or page your doctor if it is after hours.   Once a day, cleanse the wound with soap and water. It is fine to shower. If a thick crust develops you may use a Q-tip dipped into dilute hydrogen peroxide (mix 1:1 with water) to dissolve it.  Hydrogen peroxide can slow the healing process, so use it only as needed.    After washing, apply petroleum jelly (Vaseline) or an antibiotic ointment if your doctor prescribed one for you, followed by a bandage.    For best healing, the wound should be covered with a layer of ointment at all times. If you are not able to keep the area covered with a bandage to hold the ointment in place, this may mean re-applying the ointment several times a day.  Continue this wound care until the wound has healed and is no longer open. It may take several weeks for the wound to heal and close.  Itching and mild discomfort is normal during the healing process.  If you have any discomfort, you can take Tylenol  (acetaminophen ) or ibuprofen as directed on the bottle. (Please do not take these if you have an allergy to them or cannot take them for another reason).  Some redness, tenderness and white or yellow material in the wound is normal healing.  If the area becomes very sore and red, or develops a thick yellow-green material (pus), it may be infected; please  notify us .    Wound healing continues for up to one year following surgery. It is not unusual to experience pain in the scar from time to time during the interval.  If the pain becomes severe or the scar thickens, you should notify the office.    A slight amount of redness in a scar is expected for the first six months.  After six months, the redness will fade and the scar will soften and fade.  The color difference becomes less noticeable with time.  If there are any problems, return for a post-op surgery check at your earliest convenience.  To improve the appearance of the scar, you can use silicone scar gel, cream, or sheets (such as Mederma or Serica) every night for up to one year. These are available over the counter (without a prescription).  Please call our office at [phone] for any questions or concerns.     Recommend daily broad spectrum sunscreen SPF 30+ to sun-exposed areas, reapply every 2 hours as needed. Call for new or changing lesions.  Staying in the shade or wearing long sleeves, sun glasses (UVA+UVB protection) and wide brim hats (4-inch brim around the entire circumference of the hat) are also recommended for sun protection.    Melanoma ABCDEs  Melanoma is the most dangerous type of skin cancer, and is the leading cause of death from skin disease.  You are more likely to  develop melanoma if you: Have light-colored skin, light-colored eyes, or red or blond hair Spend a lot of time in the sun Tan regularly, either outdoors or in a tanning bed Have had blistering sunburns, especially during childhood Have a close family member who has had a melanoma Have atypical moles or large birthmarks  Early detection of melanoma is key since treatment is typically straightforward and cure rates are extremely high if we catch it early.   The first sign of melanoma is often a change in a mole or a new dark spot.  The ABCDE system is a way of remembering the signs of  melanoma.  A for asymmetry:  The two halves do not match. B for border:  The edges of the growth are irregular. C for color:  A mixture of colors are present instead of an even brown color. D for diameter:  Melanomas are usually (but not always) greater than 6mm - the size of a pencil eraser. E for evolution:  The spot keeps changing in size, shape, and color.  Please check your skin once per month between visits. You can use a small mirror in front and a large mirror behind you to keep an eye on the back side or your body.   If you see any new or changing lesions before your next follow-up, please call to schedule a visit.  Please continue daily skin protection including broad spectrum sunscreen SPF 30+ to sun-exposed areas, reapplying every 2 hours as needed when you're outdoors.   Staying in the shade or wearing long sleeves, sun glasses (UVA+UVB protection) and wide brim hats (4-inch brim around the entire circumference of the hat) are also recommended for sun protection.      Due to recent changes in healthcare laws, you may see results of your pathology and/or laboratory studies on MyChart before the doctors have had a chance to review them. We understand that in some cases there may be results that are confusing or concerning to you. Please understand that not all results are received at the same time and often the doctors may need to interpret multiple results in order to provide you with the best plan of care or course of treatment. Therefore, we ask that you please give us  2 business days to thoroughly review all your results before contacting the office for clarification. Should we see a critical lab result, you will be contacted sooner.   If You Need Anything After Your Visit  If you have any questions or concerns for your doctor, please call our main line at [phone] and press option 4 to reach your doctor's medical assistant. If no one answers, please leave a voicemail as  directed and we will return your call as soon as possible. Messages left after 4 pm will be answered the following business day.   You may also send us  a message via MyChart. We typically respond to MyChart messages within 1-2 business days.  For prescription refills, please ask your pharmacy to contact our office. Our fax number is [phone].  If you have an urgent issue when the clinic is closed that cannot wait until the next business day, you can page your doctor at the number below.    Please note that while we do our best to be available for urgent issues outside of office hours, we are not available 24/7.   If you have an urgent issue and are unable to reach us , you may choose to seek medical care  at your doctor's office, retail clinic, urgent care center, or emergency room.  If you have a medical emergency, please immediately call 911 or go to the emergency department.  Pager Numbers  - Dr. Hester: [phone]  - Dr. Jackquline: [phone]  - Dr. Claudene: [phone]   In the event of inclement weather, please call our main line at [phone] for an update on the status of any delays or closures.  Dermatology Medication Tips: Please keep the boxes that topical medications come in in order to help keep track of the instructions about where and how to use these. Pharmacies typically print the medication instructions only on the boxes and not directly on the medication tubes.   If your medication is too expensive, please contact our office at [phone] option 4 or send us  a message through MyChart.   We are unable to tell what your co-pay for medications will be in advance as this is different depending on your insurance coverage. However, we may be able to find a substitute medication at lower cost or fill out paperwork to get insurance to cover a needed medication.   If a prior authorization is required to get your medication covered by your insurance company, please  allow us  1-2 business days to complete this process.  Drug prices often vary depending on where the prescription is filled and some pharmacies may offer cheaper prices.  The website www.goodrx.com contains coupons for medications through different pharmacies. The prices here do not account for what the cost may be with help from insurance (it may be cheaper with your insurance), but the website can give you the price if you did not use any insurance.  - You can print the associated coupon and take it with your prescription to the pharmacy.  - You may also stop by our office during regular business hours and pick up a GoodRx coupon card.  - If you need your prescription sent electronically to a different pharmacy, notify our office through [facility] or by phone at [phone] option 4.     Si Usted Necesita Algo Despus de Su Visita  Tambin puede enviarnos un mensaje a travs de Clinical cytogeneticist. Por lo general respondemos a los mensajes de MyChart en el transcurso de 1 a 2 das hbiles.  Para renovar recetas, por favor pida a su farmacia que se ponga en contacto con nuestra oficina. Randi lakes de fax es [place] [phone].  Si tiene un asunto urgente cuando la clnica est cerrada y que no puede esperar hasta el siguiente da hbil, puede llamar/localizar a su doctor(a) al nmero que aparece a continuacin.   Por favor, tenga en cuenta que aunque hacemos todo lo posible para estar disponibles para asuntos urgentes fuera del horario de [place], no estamos disponibles las 24 horas del da, los 7 [address]  [address] de la [place].   Si tiene un problema urgente y no puede comunicarse con nosotros, puede optar por buscar atencin mdica  en el consultorio de su doctor(a), en una clnica privada, en un centro de atencin urgente o en una sala de emergencias.  Si tiene Engineer, drilling, por favor llame inmediatamente al 911 o vaya a la sala de emergencias.  Nmeros de bper  - Dr. Hester:  [phone]  - Dra. Jackquline: [phone]  - Dr. Claudene: [phone]   En caso de inclemencias del tiempo, por favor llame a landry capes principal al [phone] para una actualizacin sobre el [place] de cualquier retraso o cierre.  Consejos para la  medicacin en dermatologa: Por favor, guarde las cajas en las que vienen los medicamentos de uso tpico para ayudarle a seguir las instrucciones sobre dnde y cmo usarlos. Las farmacias generalmente imprimen las instrucciones del medicamento slo en las cajas y no directamente en los tubos del [place].   Si su medicamento es muy caro, por favor, pngase en contacto con landry rieger llamando al [phone] y presione la opcin 4 o envenos un mensaje a travs de Clinical cytogeneticist.   No podemos decirle cul ser su copago por los medicamentos por adelantado ya que esto es diferente dependiendo de la cobertura de su seguro. Sin embargo, es posible que podamos encontrar un medicamento sustituto a Audiological scientist un formulario para que el seguro cubra el medicamento que se considera necesario.   Si se requiere una autorizacin previa para que su compaa de seguros malta su medicamento, por favor permtanos de 1 a 2 das hbiles para completar este proceso.  Los precios de los medicamentos varan con frecuencia dependiendo del Environmental consultant de dnde se surte la receta y alguna farmacias pueden ofrecer precios ms baratos.  El sitio web www.goodrx.com tiene cupones para medicamentos de Health and safety inspector. Los precios aqu no tienen en cuenta lo que podra costar con la ayuda del seguro (puede ser ms barato con su seguro), pero el sitio web puede darle el precio si no utiliz Tourist information centre manager.  - Puede imprimir el cupn correspondiente y llevarlo con su receta a la farmacia.  - Tambin puede pasar por nuestra oficina durante el horario de atencin regular y Education officer, museum una tarjeta de cupones de GoodRx.  - Si necesita que su receta se enve electrnicamente a  una farmacia diferente, informe a nuestra oficina a travs de MyChart de [place] o por telfono llamando al [phone] y presione la opcin 4.

## 2024-06-17 LAB — SURGICAL PATHOLOGY

## 2024-06-22 ENCOUNTER — Encounter: Payer: Self-pay | Admitting: Dermatology

## 2024-06-22 ENCOUNTER — Ambulatory Visit: Payer: Self-pay | Admitting: Dermatology

## 2024-06-22 NOTE — Telephone Encounter (Signed)
-----   Message from Rexene Rattler sent at 06/22/2024 12:57 PM EDT ----- 1. Skin, L mid forearm :       SUPERFICIAL BASAL CELL CARCINOMA  BCC skin cancer- already treated with EDC at time of biopsy   - please call patient ----- Message ----- From: Interface, Lab In Three Zero One Sent: 06/17/2024   5:00 PM EDT To: Rexene Rattler, MD

## 2024-06-22 NOTE — Telephone Encounter (Signed)
 Advised patient biopsy of the left mid forearm was Superficial BCC and was treated with EDC. Discussed 6 month follow-up to recheck, patient will call back if she sees any recurrence before next appointment.

## 2024-07-09 ENCOUNTER — Other Ambulatory Visit: Payer: Self-pay | Admitting: Internal Medicine

## 2024-07-16 ENCOUNTER — Encounter: Payer: Self-pay | Admitting: Internal Medicine

## 2024-07-16 ENCOUNTER — Ambulatory Visit: Payer: Medicare PPO | Admitting: Internal Medicine

## 2024-07-16 VITALS — BP 142/86 | HR 64 | Ht 67.5 in | Wt 117.0 lb

## 2024-07-16 DIAGNOSIS — D72820 Lymphocytosis (symptomatic): Secondary | ICD-10-CM

## 2024-07-16 DIAGNOSIS — E559 Vitamin D deficiency, unspecified: Secondary | ICD-10-CM | POA: Diagnosis not present

## 2024-07-16 DIAGNOSIS — R7301 Impaired fasting glucose: Secondary | ICD-10-CM

## 2024-07-16 DIAGNOSIS — Z0001 Encounter for general adult medical examination with abnormal findings: Secondary | ICD-10-CM | POA: Diagnosis not present

## 2024-07-16 DIAGNOSIS — M810 Age-related osteoporosis without current pathological fracture: Secondary | ICD-10-CM | POA: Diagnosis not present

## 2024-07-16 DIAGNOSIS — D7589 Other specified diseases of blood and blood-forming organs: Secondary | ICD-10-CM

## 2024-07-16 DIAGNOSIS — C9110 Chronic lymphocytic leukemia of B-cell type not having achieved remission: Secondary | ICD-10-CM

## 2024-07-16 DIAGNOSIS — Z716 Tobacco abuse counseling: Secondary | ICD-10-CM

## 2024-07-16 DIAGNOSIS — R030 Elevated blood-pressure reading, without diagnosis of hypertension: Secondary | ICD-10-CM | POA: Diagnosis not present

## 2024-07-16 DIAGNOSIS — G252 Other specified forms of tremor: Secondary | ICD-10-CM

## 2024-07-16 DIAGNOSIS — I798 Other disorders of arteries, arterioles and capillaries in diseases classified elsewhere: Secondary | ICD-10-CM | POA: Diagnosis not present

## 2024-07-16 DIAGNOSIS — E032 Hypothyroidism due to medicaments and other exogenous substances: Secondary | ICD-10-CM

## 2024-07-16 DIAGNOSIS — E7800 Pure hypercholesterolemia, unspecified: Secondary | ICD-10-CM | POA: Diagnosis not present

## 2024-07-16 DIAGNOSIS — K700 Alcoholic fatty liver: Secondary | ICD-10-CM

## 2024-07-16 DIAGNOSIS — Z124 Encounter for screening for malignant neoplasm of cervix: Secondary | ICD-10-CM

## 2024-07-16 LAB — COMPREHENSIVE METABOLIC PANEL WITH GFR
ALT: 19 U/L (ref 0–35)
AST: 26 U/L (ref 0–37)
Albumin: 4.5 g/dL (ref 3.5–5.2)
Alkaline Phosphatase: 59 U/L (ref 39–117)
BUN: 15 mg/dL (ref 6–23)
CO2: 26 meq/L (ref 19–32)
Calcium: 9.1 mg/dL (ref 8.4–10.5)
Chloride: 103 meq/L (ref 96–112)
Creatinine, Ser: 0.74 mg/dL (ref 0.40–1.20)
GFR: 82.77 mL/min (ref >60.00)
Glucose, Bld: 97 mg/dL (ref 70–99)
Potassium: 4.2 meq/L (ref 3.5–5.1)
Sodium: 139 meq/L (ref 135–145)
Total Bilirubin: 0.7 mg/dL (ref 0.2–1.2)
Total Protein: 7.2 g/dL (ref 6.0–8.3)

## 2024-07-16 LAB — CBC WITH DIFFERENTIAL/PLATELET
Basophils Absolute: 0.1 K/uL (ref 0.0–0.1)
Basophils Relative: 0.6 % (ref 0.0–3.0)
Eosinophils Absolute: 0.4 K/uL (ref 0.0–0.7)
Eosinophils Relative: 2.7 % (ref 0.0–5.0)
HCT: 43.9 % (ref 36.0–46.0)
Hemoglobin: 14.8 g/dL (ref 12.0–15.0)
Lymphocytes Relative: 46.4 % — ABNORMAL HIGH (ref 12.0–46.0)
Lymphs Abs: 6.3 K/uL — ABNORMAL HIGH (ref 0.7–4.0)
MCHC: 33.7 g/dL (ref 30.0–36.0)
MCV: 103.1 fl — ABNORMAL HIGH (ref 78.0–100.0)
Monocytes Absolute: 0.4 K/uL (ref 0.1–1.0)
Monocytes Relative: 2.7 % — ABNORMAL LOW (ref 3.0–12.0)
Neutro Abs: 6.4 K/uL (ref 1.4–7.7)
Neutrophils Relative %: 47.6 % (ref 43.0–77.0)
Platelets: 224 K/uL (ref 150.0–400.0)
RBC: 4.26 Mil/uL (ref 3.87–5.11)
RDW: 13.4 % (ref 11.5–15.5)
WBC: 13.5 K/uL — ABNORMAL HIGH (ref 4.0–10.5)

## 2024-07-16 LAB — HEMOGLOBIN A1C: Hgb A1c MFr Bld: 6 % (ref 4.6–6.5)

## 2024-07-16 LAB — LIPID PANEL
Cholesterol: 152 mg/dL (ref 0–200)
HDL: 67.5 mg/dL (ref >39.00)
LDL Cholesterol: 68 mg/dL (ref 0–99)
NonHDL: 84.72
Total CHOL/HDL Ratio: 2
Triglycerides: 84 mg/dL (ref 0.0–149.0)
VLDL: 16.8 mg/dL (ref 0.0–40.0)

## 2024-07-16 LAB — MICROALBUMIN / CREATININE URINE RATIO
Creatinine,U: 159.5 mg/dL
Microalb Creat Ratio: 8 mg/g (ref 0.0–30.0)
Microalb, Ur: 1.3 mg/dL (ref 0.0–1.9)

## 2024-07-16 LAB — LDL CHOLESTEROL, DIRECT: Direct LDL: 68 mg/dL

## 2024-07-16 LAB — TSH: TSH: 1.31 u[IU]/mL (ref 0.35–5.50)

## 2024-07-16 MED ORDER — TETANUS-DIPHTH-ACELL PERTUSSIS 5-2.5-18.5 LF-MCG/0.5 IM SUSY: 0.5000 mL | PREFILLED_SYRINGE | Freq: Once | INTRAMUSCULAR | 0 refills | Status: AC

## 2024-07-16 MED ORDER — MIRTAZAPINE 15 MG PO TABS: 15.0000 mg | ORAL_TABLET | Freq: Every day | ORAL | 1 refills | Status: AC

## 2024-07-16 MED ORDER — TETANUS-DIPHTH-ACELL PERTUSSIS 5-2.5-18.5 LF-MCG/0.5 IM SUSY: 0.5000 mL | PREFILLED_SYRINGE | Freq: Once | INTRAMUSCULAR | 0 refills | Status: DC

## 2024-07-16 NOTE — Progress Notes (Deleted)
 Subjective:  Patient ID: Tracie Morris, female    DOB: 30-Mar-1956  Age: 68 y.o. MRN: [id number]  CC: The primary encounter diagnosis was Pure hypercholesterolemia. Diagnoses of Iatrogenic hypothyroidism and Impaired fasting glucose were also pertinent to this visit.   HPI Tracie Morris presents for  Chief Complaint  Patient presents with  . Medical Management of Chronic Issues    6 month follow up    1) elevated blood pressure:  she has no history of elevated blood pressure . Had pizza  yesterday ([employer] ) and drinks an electrolyte replacement drink daily  which has 230 mg sodium per serving   2) PAD:  taking  Crestor . Reviewed her lower extremity duplex studies done in April by Dr Darron.  She is asymptomatic   3) lung  ca screening : she undergoes scanning every October ;  her next CT has not been scheduled  yet  4) tobacco abuse: she  has not resumed tobacco and continues to use chantix  since she quit last 20-Jun-2024 after husband hDwight died.    5) history of alcohol  abuse.  She has remained alcohol  abstinent       Outpatient Medications Prior to Visit  Medication Sig Dispense Refill  . clopidogrel  (PLAVIX ) 75 MG tablet TAKE 1 TABLET BY MOUTH ONCE DAILY 30 tablet 11  . Colostrum 500 MG CAPS Take 500 mg by mouth daily.     . Glycerin-Hypromellose-PEG 400 (DRY EYE RELIEF DROPS) 0.2-0.2-1 % SOLN Place 1 drop into both eyes daily as needed (Dry eye).    . levothyroxine  (SYNTHROID ) 50 MCG tablet Take 1 tablet (50 mcg total) by mouth daily. 90 tablet 3  . mirtazapine  (REMERON ) 15 MG tablet Take 1 tablet (15 mg total) by mouth at bedtime. 90 tablet 1  . propranolol  (INDERAL ) 10 MG tablet TAKE 1 TABLET BY MOUTH 3 TIMES DAILY AS NEEDED FOR TREMOR 90 tablet 0  . rosuvastatin  (CRESTOR ) 40 MG tablet TAKE 1 TABLET BY MOUTH ONCE EVERY EVENING 90 tablet 3  . varenicline  (CHANTIX ) 1 MG tablet TAKE 1 TABLET BY MOUTH TWICE DAILY 56 tablet 0  . VITAMIN D  PO Take by mouth daily.    .  naloxone (NARCAN) nasal spray 4 mg/0.1 mL as directed. (Patient not taking: Reported on 07/16/2024)     Facility-Administered Medications Prior to Visit  Medication Dose Route Frequency Provider Last Rate Last Admin  . denosumab  (PROLIA ) injection 60 mg  60 mg Subcutaneous Once Sharyn Brilliant L, MD        Review of Systems;  Patient denies headache, fevers, malaise, unintentional weight loss, skin rash, eye pain, sinus congestion and sinus pain, sore throat, dysphagia,  hemoptysis , cough, dyspnea, wheezing, chest pain, palpitations, orthopnea, edema, abdominal pain, nausea, melena, diarrhea, constipation, flank pain, dysuria, hematuria, urinary  Frequency, nocturia, numbness, tingling, seizures,  Focal weakness, Loss of consciousness,  Tremor, insomnia, depression, anxiety, and suicidal ideation.      Objective:  BP (!) 142/86   Pulse 64   Ht 5' 7.5 (1.715 m)   Wt 117 lb (53.1 kg)   SpO2 97%   BMI 18.05 kg/m   BP Readings from Last 3 Encounters:  07/16/24 (!) 142/86  01/17/24 128/76  01/14/24 136/88    Wt Readings from Last 3 Encounters:  07/16/24 117 lb (53.1 kg)  01/17/24 115 lb 12.8 oz (52.5 kg)  01/14/24 120 lb (54.4 kg)    Physical Exam Vitals reviewed.  Constitutional:      General:  She is not in acute distress.    Appearance: Normal appearance. She is normal weight. She is not ill-appearing, toxic-appearing or diaphoretic.  HENT:     Head: Normocephalic.  Eyes:     General: No scleral icterus.       Right eye: No discharge.        Left eye: No discharge.     Conjunctiva/sclera: Conjunctivae normal.  Cardiovascular:     Rate and Rhythm: Normal rate and regular rhythm.     Heart sounds: Normal heart sounds.  Pulmonary:     Effort: Pulmonary effort is normal. No respiratory distress.     Breath sounds: Normal breath sounds.  Musculoskeletal:        General: Normal range of motion.  Skin:    General: Skin is warm and dry.  Neurological:     General: No  focal deficit present.     Mental Status: She is alert and oriented to person, place, and time. Mental status is at baseline.  Psychiatric:        Mood and Affect: Mood normal.        Behavior: Behavior normal.        Thought Content: Thought content normal.        Judgment: Judgment normal.    Lab Results  Component Value Date   HGBA1C 6.2 01/17/2024   HGBA1C 5.8 07/11/2023   HGBA1C 6.0 01/14/2023    Lab Results  Component Value Date   CREATININE 0.73 01/17/2024   CREATININE 0.79 08/26/2023   CREATININE 1.11 07/11/2023    Lab Results  Component Value Date   WBC 12.7 (H) 01/17/2024   HGB 15.3 (H) 01/17/2024   HCT 45.8 01/17/2024   PLT 241.0 01/17/2024   GLUCOSE 90 01/17/2024   CHOL 118 01/17/2024   TRIG 70.0 01/17/2024   HDL 58.40 01/17/2024   LDLDIRECT 55.0 01/17/2024   LDLCALC 46 01/17/2024   ALT 18 01/17/2024   AST 24 01/17/2024   NA 143 01/17/2024   K 4.3 01/17/2024   CL 103 01/17/2024   CREATININE 0.73 01/17/2024   BUN 14 01/17/2024   CO2 32 01/17/2024   TSH 2.19 03/03/2024   INR 1.0 11/05/2016   HGBA1C 6.2 01/17/2024    CT CHEST LUNG CA SCREEN LOW DOSE W/O CM Result Date: 10/25/2023 CLINICAL DATA:  Former 47 pack-year smoker.  Quit in July. EXAM: CT CHEST WITHOUT CONTRAST LOW-DOSE FOR LUNG CANCER SCREENING TECHNIQUE: Multidetector CT imaging of the chest was performed following the standard protocol without IV contrast. RADIATION DOSE REDUCTION: This exam was performed according to the departmental dose-optimization program which includes automated exposure control, adjustment of the mA and/or kV according to patient size and/or use of iterative reconstruction technique. COMPARISON:  09/19/2022. FINDINGS: Cardiovascular: Atherosclerotic calcification of the aorta, aortic valve and coronary arteries. Heart size normal. No pericardial effusion. Mediastinum/Nodes: No pathologically enlarged mediastinal or axillary lymph nodes. Hilar regions are difficult to  definitively evaluate without IV contrast. Esophagus is grossly unremarkable. Lungs/Pleura: Centrilobular emphysema. Calcified granulomas. Residual smoking related respiratory bronchiolitis. Scattered pulmonary parenchymal scarring. Pulmonary nodules measure 3.9 mm or less in size, as before. No new pulmonary nodules. No pleural fluid. Airway is unremarkable. Upper Abdomen: Visualized portions of the liver, adrenal glands, kidneys, spleen, pancreas, stomach and bowel are grossly unremarkable. No upper abdominal adenopathy. Musculoskeletal: Minimal degenerative change in the spine. IMPRESSION: 1. Lung-RADS 2, benign appearance or behavior. Continue annual screening with low-dose chest CT without contrast in 12 months. 2. Aortic  atherosclerosis (ICD10-I70.0). Coronary artery calcification. 3.  Emphysema (ICD10-J43.9). Electronically Signed   By: Newell Eke M.D.   On: 10/25/2023 12:52    Assessment & Plan:  .Pure hypercholesterolemia  Iatrogenic hypothyroidism  Impaired fasting glucose     I spent 34 minutes on the day of this face to face encounter reviewing patient's  most recent visit with cardiology,  nephrology,  and neurology,  prior relevant surgical and non surgical procedures, recent  labs and imaging studies, counseling on weight management,  reviewing the assessment and plan with patient, and post visit ordering and reviewing of  diagnostics and therapeutics with patient  .   Follow-up: No follow-ups on file.   Verneita LITTIE Kettering, MD

## 2024-07-16 NOTE — Patient Instructions (Signed)
 YOUR BLOOD PRESSURE IS ELEVATED TODAY  It should be around 130/80 or less.  Please check your blood pressure a few times at home and send me the readings so I can determine if you need to start  MEDICATION.  I am checking your urine for protein.  This may indicate a need for BP medications regardless of your home readings.

## 2024-07-16 NOTE — Assessment & Plan Note (Addendum)
 Reviewed diet which is high in sodium. SABRA Seh will check his blood pressure several times over the next 3-4 weeks and to submit readings for evaluation. Urine microalbumin to creatinine ratio done today is normal; she has no microabuminuria

## 2024-07-17 LAB — IRON,TIBC AND FERRITIN PANEL
%SAT: 67 % — ABNORMAL HIGH (ref 16–45)
Ferritin: 122 ng/mL (ref 16–288)
Iron: 232 ug/dL — ABNORMAL HIGH (ref 45–160)
TIBC: 346 ug/dL (ref 250–450)

## 2024-07-18 ENCOUNTER — Ambulatory Visit: Payer: Self-pay | Admitting: Internal Medicine

## 2024-07-18 ENCOUNTER — Encounter: Payer: Self-pay | Admitting: Internal Medicine

## 2024-07-18 DIAGNOSIS — C9110 Chronic lymphocytic leukemia of B-cell type not having achieved remission: Secondary | ICD-10-CM | POA: Insufficient documentation

## 2024-07-18 NOTE — Assessment & Plan Note (Signed)
 Improved with alcohol  abstinence and low dose propranolol .

## 2024-07-18 NOTE — Assessment & Plan Note (Signed)
 Diagnosed in 2017. Not requiring BM biosy or treatment per hematology (finnegan)  per review of last note in 2024.  She is no longer followed by hematology since 2024. Per patient choice. . I will continue semi annual surveillance with CBC which was done today and shows no change.   Lab Results  Component Value Date   WBC 13.5 (H) 07/16/2024   HGB 14.8 07/16/2024   HCT 43.9 07/16/2024   MCV 103.1 (H) 07/16/2024   PLT 224.0 07/16/2024

## 2024-07-18 NOTE — Addendum Note (Signed)
 Addended by: MARYLYNN VERNEITA CROME on: 07/18/2024 11:46 AM   Modules accepted: Orders, Level of Service

## 2024-07-18 NOTE — Assessment & Plan Note (Addendum)
 Thyroid  function is therapeutic on current dose of 50 mcg.   Lab Results  Component Value Date   TSH 1.31 07/16/2024

## 2024-07-18 NOTE — Assessment & Plan Note (Signed)
 Suggested by last CT .   LFTS  remain normal with alcohol  abstinence   Lab Results  Component Value Date   ALT 19 07/16/2024   AST 26 07/16/2024   ALKPHOS 59 07/16/2024   BILITOT 0.7 07/16/2024

## 2024-07-18 NOTE — Assessment & Plan Note (Signed)
 She has been smoke free sinceApril 2024  , when her husband Tracie Morris passed away.  Using chantix  .  Encouragement given to continue Chantix 

## 2024-07-18 NOTE — Assessment & Plan Note (Signed)

## 2024-07-18 NOTE — Assessment & Plan Note (Signed)
 Confirmed with testing.  hgb has decreased to < 15 and ferritin is < 200.  I will Continue semi annual surveillance  a nd refer back to hematology if her values increase  Lab Results  Component Value Date   WBC 13.5 (H) 07/16/2024   HGB 14.8 07/16/2024   HCT 43.9 07/16/2024   MCV 103.1 (H) 07/16/2024   PLT 224.0 07/16/2024   Lab Results  Component Value Date   IRON 232 (H) 07/16/2024   TIBC 346 07/16/2024   FERRITIN 122 07/16/2024

## 2024-07-18 NOTE — Assessment & Plan Note (Signed)
Stable by repeat labs.  CLL per Oncology.  Continue annual oncology follow up and semi annual CBC

## 2024-07-18 NOTE — Assessment & Plan Note (Addendum)
 She is asymptomatic with light exercise.  surveillance screening in April 2025  by Dr Darron noted normal ABI with patent left iliac and SFA stents.  contiue ASA and plavix , tobacco abstinence.  Repeat studies in 1 year.

## 2024-07-18 NOTE — Progress Notes (Addendum)
 Patient ID: Tracie Morris, female    DOB: 1956/10/30  Age: 68 y.o. MRN: [id number]  The patient is here for annual wellness examination and management of other chronic and acute problems.   The risk factors are reflected in the social history.  The roster of all physicians providing medical care to patient - is listed in the Snapshot section of the chart.  Activities of daily living:  The patient is 100% independent in all ADLs: dressing, toileting, feeding as well as independent mobility  Home safety : The patient has smoke detectors in the home. They wear seatbelts.  There are no firearms at home. There is no violence in the home.   There is no risks for hepatitis, STDs or HIV. There is no   history of blood transfusion. They have no travel history to infectious disease endemic areas of the world.  The patient has seen their dentist in the last six month. They have seen their eye doctor in the last year. They admit to slight hearing difficulty with regard to whispered voices and some television programs.  They have deferred audiologic testing in the last year.  They do not  have excessive sun exposure. Discussed the need for sun protection: hats, long sleeves and use of sunscreen if there is significant sun exposure.   Diet: the importance of a healthy diet is discussed. They do have a healthy diet.  The benefits of regular aerobic exercise were discussed. She walks 4 times per week ,  20 minutes.   Depression screen: there are no signs or vegative symptoms of depression- irritability, change in appetite, anhedonia, sadness/tearfullness.  Cognitive assessment: the patient manages all their financial and personal affairs and is actively engaged. They could relate day,date,year and events; recalled 2/3 objects at 3 minutes; performed clock-face test normally.  The following portions of the patient's history were reviewed and updated as appropriate: allergies, current medications, past family  history, past medical history,  past surgical history, past social history  and problem list.  Visual acuity was not assessed per patient preference since she has regular follow up with her ophthalmologist. Hearing and body mass index were assessed and reviewed.   During the course of the visit the patient was educated and counseled about appropriate screening and preventive services including : fall prevention , diabetes screening, nutrition counseling, colorectal cancer screening, and recommended immunizations.    CC: The primary encounter diagnosis was Encounter for general adult medical examination with abnormal findings. Diagnoses of Pure hypercholesterolemia, Iatrogenic hypothyroidism, Impaired fasting glucose, Elevated blood pressure reading in office without diagnosis of hypertension, Hereditary hemochromatosis (HCC), Screening for cervical cancer, Persistent lymphocytosis, Elevated blood pressure reading without diagnosis of hypertension, Peripheral vascular disease, secondary (HCC), Tobacco abuse counseling, Osteoporosis without current pathological fracture, unspecified osteoporosis type, Macrocytosis without anemia, Vitamin D  deficiency, Intention tremor, Alcoholic fatty liver, and Chronic lymphocytic leukemia (HCC) were also pertinent to this visit.  1) elevated blood pressure:  she has no history of elevated blood pressure . Had pizza  yesterday ([employer] ) and drinks an electrolyte replacement drink daily  which has 230 mg sodium per serving   2) PAD:  taking  Crestor . Reviewed her lower extremity duplex studies done in April by Dr Darron.  She is asymptomatic   3) lung  ca screening : she undergoes scanning every October ;  her next CT has not been scheduled  yet  4) tobacco abuse: she  has not resumed tobacco and continues to use chantix   since she quit last July after husband hDwight died.    5) history of alcohol  abuse.  She has remained alcohol  abstinent   History Antaniya has a  past medical history of Allergy (1987), Anxiety, Arthritis, Basal cell carcinoma (03/29/2014), Basal cell carcinoma (01/18/2020), Basal cell carcinoma (06/16/2024), Blood transfusion without reported diagnosis (1980), Breast mass (09/05/2020), Carotid artery occlusion, Depression, Emphysema of lung (HCC), Hemochromatosis, Hyperlipidemia, Hypocalcemia, hypothyroidism (2007), Hypothyroidism, Mass of breast, right (08/28/2020), Migraine, multinodular goiter (2007), Peripheral arterial disease (HCC), PONV (postoperative nausea and vomiting), and Squamous cell carcinoma of skin (06/23/2018).   She has a past surgical history that includes Thyroid  surgery (11/26/2005); Angioplasty illiac artery; Cesarean section; Nose surgery; Skin cancer excision; abdominal aortagram (N/A, 10/14/2013); Cardiac catheterization (N/A, 11/14/2016); Cardiac catheterization (Bilateral, 11/14/2016); Cardiac catheterization (Left, 11/14/2016); Colonoscopy with propofol  (N/A, 09/22/2020); polypectomy (09/22/2020); ABDOMINAL AORTOGRAM W/LOWER EXTREMITY (N/A, 10/05/2020); PERIPHERAL VASCULAR INTERVENTION (10/05/2020); PERIPHERAL VASCULAR THROMBECTOMY (10/05/2020); Cataract extraction w/PHACO (Left, 10/09/2022); Cataract extraction w/PHACO (Right, 10/30/2022); and Hernia repair.   Her family history includes Alzheimer's disease in her father; Coronary artery disease in her maternal grandmother; Coronary artery disease (age of onset: 65) in her paternal grandmother; Heart disease (age of onset: 103) in her maternal grandmother; Heart disease (age of onset: 20) in her paternal grandmother; Stroke in her mother.She reports that she quit smoking about 12 years ago. Her smoking use included cigarettes. She started smoking about 32 years ago. She has a 20 pack-year smoking history. She has never used smokeless tobacco. She reports that she does not currently use alcohol . She reports that she does not use drugs.  Outpatient Medications Prior to Visit   Medication Sig Dispense Refill   clopidogrel  (PLAVIX ) 75 MG tablet TAKE 1 TABLET BY MOUTH ONCE DAILY 30 tablet 11   Colostrum 500 MG CAPS Take 500 mg by mouth daily.      Glycerin-Hypromellose-PEG 400 (DRY EYE RELIEF DROPS) 0.2-0.2-1 % SOLN Place 1 drop into both eyes daily as needed (Dry eye).     levothyroxine  (SYNTHROID ) 50 MCG tablet Take 1 tablet (50 mcg total) by mouth daily. 90 tablet 3   propranolol  (INDERAL ) 10 MG tablet TAKE 1 TABLET BY MOUTH 3 TIMES DAILY AS NEEDED FOR TREMOR 90 tablet 0   rosuvastatin  (CRESTOR ) 40 MG tablet TAKE 1 TABLET BY MOUTH ONCE EVERY EVENING 90 tablet 3   varenicline  (CHANTIX ) 1 MG tablet TAKE 1 TABLET BY MOUTH TWICE DAILY 56 tablet 0   VITAMIN D  PO Take by mouth daily.     mirtazapine  (REMERON ) 15 MG tablet Take 1 tablet (15 mg total) by mouth at bedtime. 90 tablet 1   naloxone (NARCAN) nasal spray 4 mg/0.1 mL as directed. (Patient not taking: Reported on 07/16/2024)     Facility-Administered Medications Prior to Visit  Medication Dose Route Frequency Provider Last Rate Last Admin   denosumab  (PROLIA ) injection 60 mg  60 mg Subcutaneous Once Marylynn Verneita CROME, MD        Review of Systems   Patient denies headache, fevers, malaise, unintentional weight loss, skin rash, eye pain, sinus congestion and sinus pain, sore throat, dysphagia,  hemoptysis , cough, dyspnea, wheezing, chest pain, palpitations, orthopnea, edema, abdominal pain, nausea, melena, diarrhea, constipation, flank pain, dysuria, hematuria, urinary  Frequency, nocturia, numbness, tingling, seizures,  Focal weakness, Loss of consciousness,  Tremor, insomnia, depression, anxiety, and suicidal ideation.     Objective:  BP (!) 142/86   Pulse 64   Ht 5' 7.5 (1.715 m)   Wt 117  lb (53.1 kg)   SpO2 97%   BMI 18.05 kg/m   Physical Exam Vitals reviewed.  Constitutional:      General: She is not in acute distress.    Appearance: Normal appearance. She is normal weight. She is not  ill-appearing, toxic-appearing or diaphoretic.  HENT:     Head: Normocephalic.  Eyes:     General: No scleral icterus.       Right eye: No discharge.        Left eye: No discharge.     Conjunctiva/sclera: Conjunctivae normal.  Cardiovascular:     Rate and Rhythm: Normal rate and regular rhythm.     Heart sounds: Normal heart sounds.  Pulmonary:     Effort: Pulmonary effort is normal. No respiratory distress.     Breath sounds: Normal breath sounds.  Musculoskeletal:        General: Normal range of motion.  Skin:    General: Skin is warm and dry.  Neurological:     General: No focal deficit present.     Mental Status: She is alert and oriented to person, place, and time. Mental status is at baseline.  Psychiatric:        Mood and Affect: Mood normal.        Behavior: Behavior normal.        Thought Content: Thought content normal.        Judgment: Judgment normal.       Assessment & Plan:  Encounter for general adult medical examination with abnormal findings Assessment & Plan: age appropriate education and counseling updated, referrals for preventative services and immunizations addressed, dietary and smoking counseling addressed, most recent labs reviewed.  I have personally reviewed and have noted:   1) the patient's medical and social history 2) The pt's use of alcohol , tobacco, and illicit drugs 3) The patient's current medications and supplements 4) Functional ability including ADL's, fall risk, home safety risk, hearing and visual impairment 5) Diet and physical activities 6) Evidence for depression or mood disorder 7) The patient's height, weight, and BMI have been recorded in the chart   I have made referrals, and provided counseling and education based on review of the above    Pure hypercholesterolemia -     Lipid panel -     LDL cholesterol, direct  Iatrogenic hypothyroidism Assessment & Plan: Thyroid  function is therapeutic on current dose of 50 mcg.    Lab Results  Component Value Date   TSH 1.31 07/16/2024     Orders: -     TSH  Impaired fasting glucose -     Comprehensive metabolic panel with GFR -     Hemoglobin A1c -     Comprehensive metabolic panel with GFR; Future  Elevated blood pressure reading in office without diagnosis of hypertension Assessment & Plan: Reviewed diet which is high in sodium. SABRA Seh will check his blood pressure several times over the next 3-4 weeks and to submit readings for evaluation. Urine microalbumin to creatinine ratio done today is normal; she has no microabuminuria  Orders: -     Microalbumin / creatinine urine ratio  Hereditary hemochromatosis (HCC) Assessment & Plan: Confirmed with testing.  hgb has decreased to < 15 and ferritin is < 200.  I will Continue semi annual surveillance  a nd refer back to hematology if her values increase  Lab Results  Component Value Date   WBC 13.5 (H) 07/16/2024   HGB 14.8 07/16/2024   HCT 43.9  07/16/2024   MCV 103.1 (H) 07/16/2024   PLT 224.0 07/16/2024   Lab Results  Component Value Date   IRON 232 (H) 07/16/2024   TIBC 346 07/16/2024   FERRITIN 122 07/16/2024     Orders: -     Iron, TIBC and Ferritin Panel -     CBC with Differential/Platelet  Screening for cervical cancer  Persistent lymphocytosis Assessment & Plan: Stable by repeat labs.  CLL per Oncology.  Continue annual oncology follow up and semi annual CBC   Orders: -     Flow cytometry panel-leukemia/lymphoma work-up  Elevated blood pressure reading without diagnosis of hypertension Assessment & Plan: Reviewed diet which is high in sodium. SABRA Seh will check his blood pressure several times over the next 3-4 weeks and to submit readings for evaluation. Urine microalbumin to creatinine ratio done today is normal; she has no microabuminuria   Peripheral vascular disease, secondary (HCC) Assessment & Plan: She is asymptomatic with light exercise.  surveillance screening in  April 2025  by Dr Darron noted normal ABI with patent left iliac and SFA stents.  contiue ASA and plavix , tobacco abstinence.  Repeat studies in 1 year.    Tobacco abuse counseling Assessment & Plan: She has been smoke free sinceApril 2024  , when her husband Vaughan passed away.  Using chantix  .  Encouragement given to continue Chantix     Osteoporosis without current pathological fracture, unspecified osteoporosis type Assessment & Plan: Secondary to alcohol  and tobacco abuse  resulting in chronic protein malnutrition and  underweight .  Continue semiiannual Prolia  given  Contraindications to EVista  ad alendronate . Last injection May 2025.   Last vitamin d  level sept 2024.  Willneed to be rechecked prior to Nov 2025 Prolia  dose.    Macrocytosis without anemia -     B12 and Folate Panel; Future  Vitamin D  deficiency -     VITAMIN D  25 Hydroxy (Vit-D Deficiency, Fractures); Future  Intention tremor Assessment & Plan: Improved with alcohol  abstinence and low dose propranolol .     Alcoholic fatty liver Assessment & Plan: Suggested by last CT .   LFTS  remain normal with alcohol  abstinence   Lab Results  Component Value Date   ALT 19 07/16/2024   AST 26 07/16/2024   ALKPHOS 59 07/16/2024   BILITOT 0.7 07/16/2024        Chronic lymphocytic leukemia (HCC) Assessment & Plan: Diagnosed in 2017. Not requiring BM biosy or treatment per hematology (finnegan)  per review of last note in 2024.  She is no longer followed by hematology since 2024. Per patient choice. . I will continue semi annual surveillance with CBC which was done today and shows no change.   Lab Results  Component Value Date   WBC 13.5 (H) 07/16/2024   HGB 14.8 07/16/2024   HCT 43.9 07/16/2024   MCV 103.1 (H) 07/16/2024   PLT 224.0 07/16/2024      Other orders -     Mirtazapine ; Take 1 tablet (15 mg total) by mouth at bedtime.  Dispense: 90 tablet; Refill: 1 -     Tetanus-Diphth-Acell Pertussis; Inject  0.5 mLs into the muscle once for 1 dose.  Dispense: 0.5 mL; Refill: 0     Follow-up: Return in about 6 months (around 01/16/2025).   Verneita LITTIE Kettering, MD

## 2024-07-18 NOTE — Assessment & Plan Note (Addendum)
 Secondary to alcohol  and tobacco abuse  resulting in chronic protein malnutrition and  underweight .  Continue semiiannual Prolia  given  Contraindications to EVista  ad alendronate . Last injection May 2025.   Last vitamin d  level sept 2024.  Willneed to be rechecked prior to Nov 2025 Prolia  dose.

## 2024-07-23 LAB — COMP PANEL: LEUKEMIA/LYMPHOMA

## 2024-07-27 ENCOUNTER — Emergency Department
Admission: EM | Admit: 2024-07-27 | Discharge: 2024-07-27 | Disposition: A | Discharge status: Home / Self Care | Attending: Emergency Medicine | Admitting: Emergency Medicine

## 2024-07-27 ENCOUNTER — Emergency Department

## 2024-07-27 ENCOUNTER — Other Ambulatory Visit: Payer: Self-pay

## 2024-07-27 DIAGNOSIS — R109 Unspecified abdominal pain: Secondary | ICD-10-CM | POA: Diagnosis not present

## 2024-07-27 DIAGNOSIS — Z856 Personal history of leukemia: Secondary | ICD-10-CM | POA: Diagnosis not present

## 2024-07-27 DIAGNOSIS — I2510 Atherosclerotic heart disease of native coronary artery without angina pectoris: Secondary | ICD-10-CM | POA: Diagnosis not present

## 2024-07-27 DIAGNOSIS — N390 Urinary tract infection, site not specified: Secondary | ICD-10-CM | POA: Insufficient documentation

## 2024-07-27 DIAGNOSIS — D72829 Elevated white blood cell count, unspecified: Secondary | ICD-10-CM | POA: Diagnosis not present

## 2024-07-27 DIAGNOSIS — K8020 Calculus of gallbladder without cholecystitis without obstruction: Secondary | ICD-10-CM | POA: Diagnosis not present

## 2024-07-27 DIAGNOSIS — R300 Dysuria: Secondary | ICD-10-CM | POA: Diagnosis present

## 2024-07-27 DIAGNOSIS — R319 Hematuria, unspecified: Secondary | ICD-10-CM | POA: Diagnosis not present

## 2024-07-27 LAB — COMPREHENSIVE METABOLIC PANEL WITH GFR
ALT: 25 U/L (ref 0–44)
AST: 36 U/L (ref 15–41)
Albumin: 4.6 g/dL (ref 3.5–5.0)
Alkaline Phosphatase: 99 U/L (ref 38–126)
Anion gap: 11 (ref 5–15)
BUN: 14 mg/dL (ref 8–23)
CO2: 24 mmol/L (ref 22–32)
Calcium: 8.8 mg/dL — ABNORMAL LOW (ref 8.9–10.3)
Chloride: 102 mmol/L (ref 98–111)
Creatinine, Ser: 0.77 mg/dL (ref 0.44–1.00)
GFR, Estimated: >60 mL/min (ref >60)
Glucose, Bld: 120 mg/dL — ABNORMAL HIGH (ref 70–99)
Potassium: 3.9 mmol/L (ref 3.5–5.1)
Sodium: 137 mmol/L (ref 135–145)
Total Bilirubin: 1.5 mg/dL — ABNORMAL HIGH (ref 0.0–1.2)
Total Protein: 8.1 g/dL (ref 6.5–8.1)

## 2024-07-27 LAB — URINALYSIS, ROUTINE W REFLEX MICROSCOPIC
Bilirubin Urine: NEGATIVE
Glucose, UA: NEGATIVE mg/dL
Ketones, ur: 20 mg/dL — AB
Nitrite: POSITIVE — AB
Protein, ur: 100 mg/dL — AB
RBC / HPF: >50 RBC/hpf (ref 0–5)
Specific Gravity, Urine: 1.016 (ref 1.005–1.030)
WBC, UA: >50 WBC/hpf (ref 0–5)
pH: 6 (ref 5.0–8.0)

## 2024-07-27 LAB — CBC
HCT: 45.6 % (ref 36.0–46.0)
Hemoglobin: 15.9 g/dL — ABNORMAL HIGH (ref 12.0–15.0)
MCH: 35.2 pg — ABNORMAL HIGH (ref 26.0–34.0)
MCHC: 34.9 g/dL (ref 30.0–36.0)
MCV: 100.9 fL — ABNORMAL HIGH (ref 80.0–100.0)
Platelets: 205 K/uL (ref 150–400)
RBC: 4.52 MIL/uL (ref 3.87–5.11)
RDW: 12.7 % (ref 11.5–15.5)
WBC: 18 K/uL — ABNORMAL HIGH (ref 4.0–10.5)
nRBC: 0 % (ref 0.0–0.2)

## 2024-07-27 LAB — LIPASE, BLOOD: Lipase: 26 U/L (ref 11–51)

## 2024-07-27 MED ORDER — CEPHALEXIN 500 MG PO CAPS: 500.0000 mg | ORAL_CAPSULE | Freq: Three times a day (TID) | ORAL | 0 refills | Status: AC

## 2024-07-27 MED ORDER — KETOROLAC TROMETHAMINE 30 MG/ML IJ SOLN
15.0000 mg | Freq: Once | INTRAMUSCULAR | Status: AC
  Administered 2024-07-27: 15 mg via INTRAVENOUS
  Filled 2024-07-27: qty 1

## 2024-07-27 MED ORDER — SODIUM CHLORIDE 0.9 % IV SOLN
1.0000 g | Freq: Once | INTRAVENOUS | Status: AC
  Administered 2024-07-27: 1 g via INTRAVENOUS
  Filled 2024-07-27: qty 10

## 2024-07-27 NOTE — ED Triage Notes (Addendum)
 Pt to ED via POV from home. Pt reports dysuria that started yesterday. Pt states blood in her urine toady and right flank pain. Pt reports hx of kidney stones. Pt is on blood thinner.

## 2024-07-27 NOTE — ED Notes (Signed)
 Patient taken to CT scan.

## 2024-07-27 NOTE — ED Provider Notes (Signed)
 [facility] Provider Note    Event Date/Time   First MD Initiated Contact with Patient 07/27/24 1126     (approximate)   History   Dysuria   HPI  Tracie Morris is a 68 y.o. female with a history of peripheral arterial disease, CAD CLL who presents with complaints of mild right flank discomfort and significant dysuria which started in the last 24 hours.  Afebrile, no nausea or vomiting     Physical Exam   Triage Vital Signs: ED Triage Vitals  Encounter Vitals Group     BP 07/27/24 1110 (!) 153/109     Girls Systolic BP Percentile --      Girls Diastolic BP Percentile --      Boys Systolic BP Percentile --      Boys Diastolic BP Percentile --      Pulse Rate 07/27/24 1107 87     Resp 07/27/24 1107 18     Temp 07/27/24 1107 98.7 F (37.1 C)     Temp Source 07/27/24 1107 Oral     SpO2 07/27/24 1107 97 %     Weight --      Height --      Head Circumference --      Peak Flow --      Pain Score 07/27/24 1108 1     Pain Loc --      Pain Education --      Exclude from Growth Chart --     Most recent vital signs: Vitals:   07/27/24 1107 07/27/24 1110  BP:  (!) 153/109  Pulse: 87   Resp: 18   Temp: 98.7 F (37.1 C)   SpO2: 97%      General: Awake, no distress.  CV:  Good peripheral perfusion.  Resp:  Normal effort.  Abd:  No distention.  No CVA tenderness, soft, nontender Other:     ED Results / Procedures / Treatments   Labs (all labs ordered are listed, but only abnormal results are displayed) Labs Reviewed  COMPREHENSIVE METABOLIC PANEL WITH GFR - Abnormal; Notable for the following components:      Result Value   Glucose, Bld 120 (*)    Calcium  8.8 (*)    Total Bilirubin 1.5 (*)    All other components within normal limits  CBC - Abnormal; Notable for the following components:   WBC 18.0 (*)    Hemoglobin 15.9 (*)    MCV 100.9 (*)    MCH 35.2 (*)    All other components within normal limits  URINALYSIS, ROUTINE W  REFLEX MICROSCOPIC - Abnormal; Notable for the following components:   Color, Urine AMBER (*)    APPearance TURBID (*)    Hgb urine dipstick LARGE (*)    Ketones, ur 20 (*)    Protein, ur 100 (*)    Nitrite POSITIVE (*)    Leukocytes,Ua LARGE (*)    Bacteria, UA MANY (*)    Non Squamous Epithelial PRESENT (*)    All other components within normal limits  LIPASE, BLOOD     EKG     RADIOLOGY CT scan viewed interpreted by me, no ureterolithiasis, confirmed by radiology    PROCEDURES:  Critical Care performed:   Procedures   MEDICATIONS ORDERED IN ED: Medications  cefTRIAXone  (ROCEPHIN ) 1 g in sodium chloride  0.9 % 100 mL IVPB (0 g Intravenous Stopped 07/27/24 1307)  ketorolac  (TORADOL ) 30 MG/ML injection 15 mg (15 mg Intravenous Given 07/27/24 1158)  IMPRESSION / MDM / ASSESSMENT AND PLAN / ED COURSE  I reviewed the triage vital signs and the nursing notes. Patient's presentation is most consistent with acute presentation with potential threat to life or bodily function.  Patient presents with right-sided pain with dysuria, differential includes UTI, pyelonephritis, infected kidney stone, ureterolithiasis  Will treat with IV Toradol , obtain CT renal stone study, labs  Lab work notable for elevated white blood cell count of 18, urinalysis consistent with UTI, will add IV Rocephin   CT scan negative for ureterolithiasis, patient feeling much improved, she is afebrile and well-appearing, appropriate for discharge with outpatient follow-up, strict return precautions of any fever worsening pain, nausea vomiting.     FINAL CLINICAL IMPRESSION(S) / ED DIAGNOSES   Final diagnoses:  Lower urinary tract infectious disease     Rx / DC Orders   ED Discharge Orders          Ordered    cephALEXin  (KEFLEX ) 500 MG capsule  3 times daily        07/27/24 1313             Note:  This document was prepared using Dragon voice recognition software and may include  unintentional dictation errors.   Arlander Charleston, MD 07/27/24 1314

## 2024-08-03 DIAGNOSIS — H35371 Puckering of macula, right eye: Secondary | ICD-10-CM | POA: Diagnosis not present

## 2024-08-03 DIAGNOSIS — M3501 Sicca syndrome with keratoconjunctivitis: Secondary | ICD-10-CM | POA: Diagnosis not present

## 2024-08-03 DIAGNOSIS — Z961 Presence of intraocular lens: Secondary | ICD-10-CM | POA: Diagnosis not present

## 2024-08-03 DIAGNOSIS — H35413 Lattice degeneration of retina, bilateral: Secondary | ICD-10-CM | POA: Diagnosis not present

## 2024-08-24 ENCOUNTER — Other Ambulatory Visit: Payer: Self-pay | Admitting: Internal Medicine

## 2024-08-24 DIAGNOSIS — Z1231 Encounter for screening mammogram for malignant neoplasm of breast: Secondary | ICD-10-CM

## 2024-09-03 ENCOUNTER — Other Ambulatory Visit: Payer: Self-pay

## 2024-09-03 DIAGNOSIS — Z87891 Personal history of nicotine dependence: Secondary | ICD-10-CM

## 2024-09-03 DIAGNOSIS — Z122 Encounter for screening for malignant neoplasm of respiratory organs: Secondary | ICD-10-CM

## 2024-09-04 DIAGNOSIS — F325 Major depressive disorder, single episode, in full remission: Secondary | ICD-10-CM | POA: Diagnosis not present

## 2024-09-04 DIAGNOSIS — E46 Unspecified protein-calorie malnutrition: Secondary | ICD-10-CM | POA: Diagnosis not present

## 2024-09-04 DIAGNOSIS — M1990 Unspecified osteoarthritis, unspecified site: Secondary | ICD-10-CM | POA: Diagnosis not present

## 2024-09-04 DIAGNOSIS — M810 Age-related osteoporosis without current pathological fracture: Secondary | ICD-10-CM | POA: Diagnosis not present

## 2024-09-04 DIAGNOSIS — I739 Peripheral vascular disease, unspecified: Secondary | ICD-10-CM | POA: Diagnosis not present

## 2024-09-04 DIAGNOSIS — F1021 Alcohol dependence, in remission: Secondary | ICD-10-CM | POA: Diagnosis not present

## 2024-09-04 DIAGNOSIS — E785 Hyperlipidemia, unspecified: Secondary | ICD-10-CM | POA: Diagnosis not present

## 2024-09-04 DIAGNOSIS — I2510 Atherosclerotic heart disease of native coronary artery without angina pectoris: Secondary | ICD-10-CM | POA: Diagnosis not present

## 2024-09-16 ENCOUNTER — Telehealth: Payer: Self-pay

## 2024-09-16 DIAGNOSIS — M810 Age-related osteoporosis without current pathological fracture: Secondary | ICD-10-CM

## 2024-09-16 NOTE — Telephone Encounter (Signed)
 Prolia  VOB initiated via [email or web address]  Next Prolia  inj DUE: 10/16/24

## 2024-09-17 ENCOUNTER — Other Ambulatory Visit (HOSPITAL_COMMUNITY): Payer: Self-pay

## 2024-09-17 NOTE — Telephone Encounter (Signed)
 Pt ready for scheduling for PROLIA  on or after : 10/16/24  Option# 1: Buy/Bill (Office supplied medication)  Out-of-pocket cost due at time of clinic visit: $372  Number of injection/visits approved: 2  Primary: [employer] Prolia  co-insurance: 20% Admin fee co-insurance: $40  Secondary: --- Prolia  co-insurance:  Admin fee co-insurance:   Medical Benefit Details: Date Benefits were checked: 09/17/24 Deductible: NO/ Coinsurance: 20%/ Admin Fee: $40  Prior Auth: APPROVED PA# [id number] Expiration Date: 11/27/23-11/25/24   # of doses approved: 2 ----------------------------------------------------------------------- Option# 2- Med Obtained from pharmacy:  Pharmacy benefit: Copay $64 (Paid to pharmacy) Admin Fee: $40 (Pay at clinic)  Prior Auth: N/A PA# Expiration Date:   # of doses approved:   If patient wants fill through the pharmacy benefit please send prescription to: [facility], and include estimated need by date in rx notes. Pharmacy will ship medication directly to the office.  Patient NOT eligible for Prolia  Copay Card. Copay Card can make patient's cost as little as $25. Link to apply: [email or web address]  ** This summary of benefits is an estimation of the patient's out-of-pocket cost. Exact cost may very based on individual plan coverage.

## 2024-09-17 NOTE — Telephone Encounter (Signed)
 SABRA

## 2024-09-18 ENCOUNTER — Ambulatory Visit
Admission: RE | Admit: 2024-09-18 | Discharge: 2024-09-18 | Disposition: A | Discharge status: Home / Self Care | Source: Ambulatory Visit | Attending: Internal Medicine | Admitting: Internal Medicine

## 2024-09-18 DIAGNOSIS — Z1231 Encounter for screening mammogram for malignant neoplasm of breast: Secondary | ICD-10-CM | POA: Diagnosis not present

## 2024-10-03 ENCOUNTER — Other Ambulatory Visit: Payer: Self-pay | Admitting: Internal Medicine

## 2024-10-06 MED ORDER — DENOSUMAB 60 MG/ML ~~LOC~~ SOSY
60.0000 mg | PREFILLED_SYRINGE | SUBCUTANEOUS | 1 refills | Status: AC
  Filled 2024-10-07: qty 1, 180d supply, fill #0

## 2024-10-06 NOTE — Addendum Note (Signed)
 Addended by: BRIEN SHARENE RAMAN on: 10/06/2024 03:26 PM   Modules accepted: Orders

## 2024-10-07 ENCOUNTER — Other Ambulatory Visit: Payer: Self-pay

## 2024-10-07 ENCOUNTER — Other Ambulatory Visit (HOSPITAL_COMMUNITY): Payer: Self-pay

## 2024-10-07 NOTE — Progress Notes (Signed)
 See benefits inv encounter from 09/16/24. Copay is $64.

## 2024-10-07 NOTE — Progress Notes (Signed)
 Specialty Pharmacy Initial Fill Coordination Note  Tracie Morris is a 68 y.o. female contacted today regarding initial fill of specialty medication(s) Denosumab  (PROLIA )   Patient requested Courier to Provider Office   Delivery date: 10/12/24   Verified address: [facility] [address]   Medication will be filled on: 10/09/24   Patient is aware of $64 copayment.

## 2024-10-08 ENCOUNTER — Other Ambulatory Visit: Payer: Self-pay

## 2024-11-03 ENCOUNTER — Ambulatory Visit
Admission: RE | Admit: 2024-11-03 | Discharge: 2024-11-03 | Disposition: A | Source: Ambulatory Visit | Attending: Acute Care | Admitting: Acute Care

## 2024-11-03 DIAGNOSIS — Z122 Encounter for screening for malignant neoplasm of respiratory organs: Secondary | ICD-10-CM

## 2024-11-03 DIAGNOSIS — Z87891 Personal history of nicotine dependence: Secondary | ICD-10-CM | POA: Diagnosis not present

## 2024-11-04 ENCOUNTER — Ambulatory Visit

## 2024-11-04 DIAGNOSIS — M47812 Spondylosis without myelopathy or radiculopathy, cervical region: Secondary | ICD-10-CM | POA: Diagnosis not present

## 2024-11-04 DIAGNOSIS — M810 Age-related osteoporosis without current pathological fracture: Secondary | ICD-10-CM | POA: Diagnosis not present

## 2024-11-04 MED ORDER — DENOSUMAB 60 MG/ML ~~LOC~~ SOSY: 60.0000 mg | PREFILLED_SYRINGE | SUBCUTANEOUS | Status: AC

## 2024-11-04 NOTE — Progress Notes (Signed)
 Patient was administered a Prolia  injection into her left arm subcutaneously. Patient tolerated the Prolia  injection well.

## 2024-11-09 ENCOUNTER — Telehealth: Payer: Self-pay | Admitting: Cardiovascular Disease

## 2024-11-09 NOTE — Telephone Encounter (Signed)
 Called patient and scheduled first available 12/19 with Lesley, reviewed ER precautions with patient - verbalized understanding and agreement with plan

## 2024-11-09 NOTE — Telephone Encounter (Signed)
 Patient states that she has artery disease in left leg. And it  very painful to walk, worried about blood clots forming. Calling to see what the dr think she should do. Please advise

## 2024-11-10 ENCOUNTER — Other Ambulatory Visit: Payer: Self-pay

## 2024-11-10 DIAGNOSIS — Z87891 Personal history of nicotine dependence: Secondary | ICD-10-CM

## 2024-11-10 DIAGNOSIS — Z122 Encounter for screening for malignant neoplasm of respiratory organs: Secondary | ICD-10-CM

## 2024-11-10 NOTE — Progress Notes (Unsigned)
 Cardiology Office Note    Date:  11/10/2024   ID:  Tracie Morris, DOB 1956-07-04, MRN [id number]  PCP:  Tracie Verneita CROME, MD  Cardiologist:  Deatrice Cage, MD  Electrophysiologist:  None   Chief Complaint: ***  History of Present Illness:   Tracie Morris is a 68 y.o. female with history of peripheral artery disease s/p L common iliac artery stent 09/2013, 2017, and 2021, hereditary hemochromatosis, hyperlipidemia, prior excessive alcohol  use, and tobacco use who presents for ***.     History of PAD with left common iliac artery stent in 09/2013 with subsequent covered stent in 2017 for in situ thrombosis with subtotal occlusion.  Prior CT demonstrated coronary artery calcification.  Seen by Dr. Cage 06/2019 for follow-up and noted significant weight loss previously attributed to excessive alcohol  use with poor p.o. intake.  She attended rehab in [place]  and was successful in quitting alcohol .  Was feeling better with some weight gain.  Denied any lower extremity claudication.  She continued to smoke.  Carotid Dopplers 03/2020 revealed mild disease.  She had a colonoscopy in 2021 with both Plavix  and colostrum held prior to procedure.  The day of the colonoscopy, she had severe left leg claudication.  Symptoms improved but she continued to have discomfort with walking.  She underwent peripheral angiogram 10/05/2020 which revealed subacute stent thrombosis of the left common iliac artery with large amount of thrombus with successful mechanical thrombectomy and balloon angioplasty of the left common iliac artery.  This was complicated by embolization into the internal iliac artery which necessitated treatment with mechanical thrombectomy.  There was also embolization into the left mid SFA which was also treated with mechanical thrombectomy and self-expanding stent placement.  There was sluggish flow distally likely due to microembolization.  Echo 09/2020 revealed EF 60 to 65% with G2 DD, and  mild to moderate MR.     Patient was most recently seen in the office by Dr. Cage 01/14/2024 doing well without chest pain, shortness of breath, or claudication.  She was continued on clopidogrel  indefinitely as tolerated.  Most recent noninvasive lower extremity testing 01/2024 revealed normal ABIs with patent left iliac and SFA stenting.  ***  Labs independently reviewed: 07/2024-Hgb 15.9, HCT 45.6, platelets 205, potassium 3.9, sodium 137, BUN 14, creatinine 0.77, normal LFTs 06/2024-TC 152, TG 84, HDL 67, LDL 68  Objective   Past Medical History:  Diagnosis Date   Allergy 1987   gut sutures   Anxiety    Arthritis    Basal cell carcinoma 03/29/2014   L lower abdomen   Basal cell carcinoma 01/18/2020   central nasal tip, [facility] at [facility]   Basal cell carcinoma 06/16/2024   L mid forearm, [facility]   Blood transfusion without reported diagnosis 1980   Breast mass 09/05/2020   right breast mass 10 oclock   Carotid artery occlusion    Depression    after general anestesia   Emphysema of lung (HCC)    Hemochromatosis    Hyperlipidemia    Hypocalcemia    hypothyroidism 2007   acquired, secondary to surgery for thyroid  Ca   Hypothyroidism    Mass of breast, right 08/28/2020   Migraine    improved since thyroid  surgery, 2 years   multinodular goiter 2007   s/p thyroidectomy   Peripheral arterial disease    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 Repeat angiography in November of 2014 showed 50%  distal aortic stenosis extending into the left common iliac artery which had 60-70% ostial stenosis. No other significant disease. 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   R nasal tip/in situ, txt'd  with [facility]     Current Medications: Active Medications[1]  Allergies:    Atorvastatin , Darvocet [propoxyphene n-acetaminophen], Demerol, Mirtazapine , Other, Primidone , and Ambien  [zolpidem  tartrate]   Social History   Socioeconomic History   Marital status: Married    Spouse name: Not on file   Number of children: Not on file   Years of education: Not on file   Highest education level: Not on file  Occupational History   Not on file  Tobacco Use   Smoking status: Former    Average packs/day: 1 pack/day for 20.0 years (20.0 ttl pk-yrs)    Types: Cigarettes    Start date: 10/28/1991    Quit date: 10/28/2011    Years since quitting: 13.0   Smokeless tobacco: Never   Tobacco comments:    Using Chantix  since April 2024  Vaping Use   Vaping status: Former  Substance and Sexual Activity   Alcohol  use: Not Currently   Drug use: No   Sexual activity: Not on file  Other Topics Concern   Not on file  Social History Narrative   Not on file   Social Drivers of Health   Tobacco Use: Medium Risk (07/27/2024)   Patient History    Smoking Tobacco Use: Former    Smokeless Tobacco Use: Never    Passive Exposure: Not on Actuary Strain: Low Risk (10/15/2023)   Overall Financial Resource Strain (CARDIA)    Difficulty of Paying Living Expenses: Not hard at all  Food Insecurity: No Food Insecurity (10/15/2023)   Hunger Vital Sign    Worried About Running Out of Food in the Last Year: Never true    Ran Out of Food in the Last Year: Never true  Transportation Needs: No Transportation Needs (10/15/2023)   PRAPARE - Administrator, Civil Service (Medical): No    Lack of Transportation (Non-Medical): No  Physical Activity: Inactive (10/15/2023)   Exercise Vital Sign    Days of Exercise per Week: 0 days    Minutes of Exercise per Session: 0 min  Stress: No Stress Concern Present (10/15/2023)   [employer] of Occupational Health - Occupational Stress Questionnaire    Feeling of Stress : Only a little  Social Connections:  Moderately Isolated (10/15/2023)   Social Connection and Isolation Panel    Frequency of Communication with Friends and Family: More than three times a week    Frequency of Social Gatherings with Friends and Family: More than three times a week    Attends Religious Services: More than 4 times per year    Active Member of [employer] or Organizations: No    Attends Banker Meetings: Never    Marital Status: Widowed  Depression (PHQ2-9): Low Risk (07/16/2024)   Depression (PHQ2-9)    PHQ-2 Score: 0  Alcohol  Screen: Low Risk (10/15/2023)   Alcohol  Screen    Last Alcohol  Screening Score (AUDIT): 1  Housing: Low Risk (10/15/2023)   Housing    Last Housing Risk Score: 0  Utilities: Not At Risk (10/15/2023)   AHC Utilities    Threatened with loss of utilities: No  Health Literacy:  Adequate Health Literacy (10/15/2023)   B1300 Health Literacy    Frequency of need for help with medical instructions: Never     Family History:  The patient's family history includes Alzheimer's disease in her father; Coronary artery disease in her maternal grandmother; Coronary artery disease (age of onset: 50) in her paternal grandmother; Heart disease (age of onset: 22) in her maternal grandmother; Heart disease (age of onset: 80) in her paternal grandmother; Stroke in her mother. There is no history of Breast cancer.  ROS:   12-point review of systems is negative unless otherwise noted in the HPI.  EKGs/Other Studies Reviewed:    Studies reviewed were summarized above. The additional studies were reviewed today:  01/2024 Lower extremity US /ABIs Normal ABIs with patent iliac and left SFA stents  EKG:  EKG personally reviewed by me today    PHYSICAL EXAM:    VS:  There were no vitals taken for this visit.  BMI: There is no height or weight on file to calculate BMI.  GEN: Well nourished, well developed in no acute distress NECK: No JVD; No carotid bruits CARDIAC: ***RRR, no murmurs, rubs,  gallops RESPIRATORY:  Clear to auscultation without rales, wheezing or rhonchi  ABDOMEN: Soft, non-tender, non-distended EXTREMITIES:  *** No edema; No deformity  Wt Readings from Last 3 Encounters:  07/16/24 117 lb (53.1 kg)  01/17/24 115 lb 12.8 oz (52.5 kg)  01/14/24 120 lb (54.4 kg)                  ASSESSMENT & PLAN:   Peripheral arterial disease   Hyperlipidemia   Carotid disease   Coronary calcifications   Previous tobacco use    {Are you ordering a CV Procedure (e.g. stress test, cath, DCCV, TEE, etc)?   Press F2        :[id number]}   Disposition: F/u with Dr. Darron or an APP in ***.   Medication Adjustments/Labs and Tests Ordered: Current medicines are reviewed at length with the patient today.  Concerns regarding medicines are outlined above. Medication changes, Labs and Tests ordered today are summarized above and listed in the Patient Instructions accessible in Encounters.   Bonney Lesley Maffucci, PA-C 11/10/2024 11:21 AM     [facility] [place] [address] [place], [place] [address] [phone]      [1]  No outpatient medications have been marked as taking for the 11/13/24 encounter (Appointment) with Maffucci Lesley CROME, PA-C.   Current Facility-Administered Medications for the 11/13/24 encounter (Appointment) with Maffucci Lesley CROME, PA-C  Medication   [START ON 05/05/2025] denosumab  (PROLIA ) injection 60 mg

## 2024-11-11 ENCOUNTER — Other Ambulatory Visit: Payer: Self-pay | Admitting: Internal Medicine

## 2024-11-13 ENCOUNTER — Ambulatory Visit: Attending: Physician Assistant | Admitting: Physician Assistant

## 2024-11-13 ENCOUNTER — Encounter: Payer: Self-pay | Admitting: Physician Assistant

## 2024-11-13 VITALS — BP 126/64 | HR 67 | Ht 67.0 in | Wt 114.6 lb

## 2024-11-13 DIAGNOSIS — I739 Peripheral vascular disease, unspecified: Secondary | ICD-10-CM

## 2024-11-13 DIAGNOSIS — I779 Disorder of arteries and arterioles, unspecified: Secondary | ICD-10-CM

## 2024-11-13 DIAGNOSIS — E785 Hyperlipidemia, unspecified: Secondary | ICD-10-CM

## 2024-11-13 DIAGNOSIS — Z720 Tobacco use: Secondary | ICD-10-CM | POA: Diagnosis not present

## 2024-11-13 DIAGNOSIS — I2510 Atherosclerotic heart disease of native coronary artery without angina pectoris: Secondary | ICD-10-CM | POA: Diagnosis not present

## 2024-11-13 NOTE — Patient Instructions (Signed)
 Medication Instructions:  Your physician recommends that you continue on your current medications as directed. Please refer to the Current Medication list given to you today.  *If you need a refill on your cardiac medications before your next appointment, please call your pharmacy*  Lab Work: No labs ordered today  If you have labs (blood work) drawn today and your tests are completely normal, you will receive your results only by: MyChart Message (if you have MyChart) OR A paper copy in the mail If you have any lab test that is abnormal or we need to change your treatment, we will call you to review the results.  Testing/Procedures: No test ordered today   Follow-Up: At [facility], you and your health needs are our priority.  As part of our continuing mission to provide you with exceptional heart care, our providers are all part of one team.  This team includes your primary Cardiologist (physician) and Advanced Practice Providers or APPs (Physician Assistants and Nurse Practitioners) who all work together to provide you with the care you need, when you need it.  Your next appointment:    AFTER PROCEDURE   Provider:   You may see Deatrice Cage, MD or one of the following Advanced Practice Providers on your designated Care Team:   Lonni Meager, NP Lesley Maffucci, PA-C Bernardino Bring, PA-C Cadence Carlos, PA-C Tylene Lunch, NP Barnie Hila, NP

## 2024-12-16 ENCOUNTER — Ambulatory Visit

## 2024-12-16 ENCOUNTER — Ambulatory Visit (INDEPENDENT_AMBULATORY_CARE_PROVIDER_SITE_OTHER)

## 2024-12-16 ENCOUNTER — Ambulatory Visit: Attending: Physician Assistant

## 2024-12-16 DIAGNOSIS — I2510 Atherosclerotic heart disease of native coronary artery without angina pectoris: Secondary | ICD-10-CM

## 2024-12-16 DIAGNOSIS — I779 Disorder of arteries and arterioles, unspecified: Secondary | ICD-10-CM

## 2024-12-16 DIAGNOSIS — I739 Peripheral vascular disease, unspecified: Secondary | ICD-10-CM

## 2024-12-16 DIAGNOSIS — Z95828 Presence of other vascular implants and grafts: Secondary | ICD-10-CM

## 2024-12-16 DIAGNOSIS — E785 Hyperlipidemia, unspecified: Secondary | ICD-10-CM

## 2024-12-16 LAB — VAS US ABI WITH/WO TBI
Left ABI: 0.69
Right ABI: 1.15

## 2024-12-18 ENCOUNTER — Ambulatory Visit: Payer: Self-pay | Admitting: Physician Assistant

## 2024-12-28 ENCOUNTER — Ambulatory Visit

## 2025-01-06 ENCOUNTER — Ambulatory Visit: Admitting: Cardiovascular Disease

## 2025-01-18 ENCOUNTER — Ambulatory Visit: Admitting: Internal Medicine

## 2025-01-20 ENCOUNTER — Other Ambulatory Visit

## 2025-06-28 ENCOUNTER — Encounter: Admitting: Dermatology
